# Patient Record
Sex: Female | Born: 1950 | Race: White | Hispanic: No | Marital: Married | Smoking: Never smoker
Health system: Southern US, Community
[De-identification: ages and names within clinical notes are randomized; demographics above are authoritative.]

## PROBLEM LIST (undated history)

## (undated) DIAGNOSIS — K831 Obstruction of bile duct: Secondary | ICD-10-CM

## (undated) DIAGNOSIS — K8689 Other specified diseases of pancreas: Secondary | ICD-10-CM

## (undated) DIAGNOSIS — Z9189 Other specified personal risk factors, not elsewhere classified: Secondary | ICD-10-CM

## (undated) DIAGNOSIS — T7840XA Allergy, unspecified, initial encounter: Secondary | ICD-10-CM

## (undated) DIAGNOSIS — E669 Obesity, unspecified: Secondary | ICD-10-CM

## (undated) DIAGNOSIS — Z9884 Bariatric surgery status: Secondary | ICD-10-CM

## (undated) DIAGNOSIS — K219 Gastro-esophageal reflux disease without esophagitis: Secondary | ICD-10-CM

## (undated) DIAGNOSIS — E1129 Type 2 diabetes mellitus with other diabetic kidney complication: Secondary | ICD-10-CM

## (undated) DIAGNOSIS — Z803 Family history of malignant neoplasm of breast: Secondary | ICD-10-CM

## (undated) DIAGNOSIS — Z8719 Personal history of other diseases of the digestive system: Secondary | ICD-10-CM

## (undated) DIAGNOSIS — E282 Polycystic ovarian syndrome: Secondary | ICD-10-CM

## (undated) DIAGNOSIS — C50919 Malignant neoplasm of unspecified site of unspecified female breast: Secondary | ICD-10-CM

## (undated) DIAGNOSIS — E119 Type 2 diabetes mellitus without complications: Secondary | ICD-10-CM

## (undated) DIAGNOSIS — G4700 Insomnia, unspecified: Secondary | ICD-10-CM

## (undated) DIAGNOSIS — I10 Essential (primary) hypertension: Secondary | ICD-10-CM

## (undated) DIAGNOSIS — Z87442 Personal history of urinary calculi: Secondary | ICD-10-CM

## (undated) DIAGNOSIS — Z973 Presence of spectacles and contact lenses: Secondary | ICD-10-CM

## (undated) DIAGNOSIS — E785 Hyperlipidemia, unspecified: Secondary | ICD-10-CM

## (undated) DIAGNOSIS — C50411 Malignant neoplasm of upper-outer quadrant of right female breast: Secondary | ICD-10-CM

## (undated) DIAGNOSIS — F419 Anxiety disorder, unspecified: Secondary | ICD-10-CM

## (undated) DIAGNOSIS — Z923 Personal history of irradiation: Secondary | ICD-10-CM

## (undated) DIAGNOSIS — E876 Hypokalemia: Secondary | ICD-10-CM

## (undated) DIAGNOSIS — T884XXA Failed or difficult intubation, initial encounter: Secondary | ICD-10-CM

## (undated) HISTORY — DX: Hyperlipidemia, unspecified: E78.5

## (undated) HISTORY — DX: Type 2 diabetes mellitus without complications: E11.9

## (undated) HISTORY — PX: DIAGNOSTIC LAPAROSCOPY: SUR761

## (undated) HISTORY — PX: BREAST BIOPSY: SHX20

## (undated) HISTORY — DX: Type 2 diabetes mellitus with other diabetic kidney complication: E11.29

## (undated) HISTORY — DX: Insomnia, unspecified: G47.00

## (undated) HISTORY — DX: Anxiety disorder, unspecified: F41.9

## (undated) HISTORY — DX: Allergy, unspecified, initial encounter: T78.40XA

## (undated) HISTORY — DX: Malignant neoplasm of upper-outer quadrant of right female breast: C50.411

## (undated) HISTORY — PX: CHOLECYSTECTOMY: SHX55

## (undated) HISTORY — DX: Malignant neoplasm of unspecified site of unspecified female breast: C50.919

## (undated) HISTORY — PX: TUBAL LIGATION: SHX77

## (undated) HISTORY — DX: Obstruction of bile duct: K83.1

## (undated) HISTORY — DX: Family history of malignant neoplasm of breast: Z80.3

## (undated) HISTORY — PX: BACK SURGERY: SHX140

---
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
## 1998-04-02 ENCOUNTER — Encounter: Payer: Self-pay | Admitting: Internal Medicine

## 1998-04-02 ENCOUNTER — Ambulatory Visit (HOSPITAL_COMMUNITY): Admission: RE | Admit: 1998-04-02 | Discharge: 1998-04-02 | Payer: Self-pay | Admitting: Internal Medicine

## 2001-06-02 ENCOUNTER — Ambulatory Visit (HOSPITAL_COMMUNITY): Admission: RE | Admit: 2001-06-02 | Discharge: 2001-06-02 | Payer: Self-pay | Admitting: Internal Medicine

## 2001-06-02 ENCOUNTER — Encounter: Payer: Self-pay | Admitting: Internal Medicine

## 2002-06-06 ENCOUNTER — Ambulatory Visit (HOSPITAL_COMMUNITY): Admission: RE | Admit: 2002-06-06 | Discharge: 2002-06-06 | Payer: Self-pay | Admitting: Internal Medicine

## 2002-06-06 ENCOUNTER — Encounter: Payer: Self-pay | Admitting: Internal Medicine

## 2002-06-24 ENCOUNTER — Encounter: Payer: Self-pay | Admitting: Internal Medicine

## 2002-06-24 ENCOUNTER — Ambulatory Visit (HOSPITAL_COMMUNITY): Admission: RE | Admit: 2002-06-24 | Discharge: 2002-06-24 | Payer: Self-pay | Admitting: Internal Medicine

## 2002-07-07 ENCOUNTER — Encounter: Payer: Self-pay | Admitting: Neurosurgery

## 2002-07-11 ENCOUNTER — Inpatient Hospital Stay (HOSPITAL_COMMUNITY): Admission: RE | Admit: 2002-07-11 | Discharge: 2002-07-12 | Payer: Self-pay | Admitting: Neurosurgery

## 2002-07-11 ENCOUNTER — Encounter: Payer: Self-pay | Admitting: Neurosurgery

## 2002-11-03 ENCOUNTER — Other Ambulatory Visit: Admission: RE | Admit: 2002-11-03 | Discharge: 2002-11-03 | Payer: Self-pay | Admitting: Internal Medicine

## 2003-01-13 LAB — HM DEXA SCAN: HM Dexa Scan: NORMAL

## 2003-08-23 ENCOUNTER — Ambulatory Visit (HOSPITAL_COMMUNITY): Admission: RE | Admit: 2003-08-23 | Discharge: 2003-08-23 | Payer: Self-pay | Admitting: Internal Medicine

## 2004-01-13 LAB — HM COLONOSCOPY

## 2004-09-16 ENCOUNTER — Ambulatory Visit: Payer: Self-pay | Admitting: Gastroenterology

## 2004-09-29 ENCOUNTER — Ambulatory Visit: Payer: Self-pay | Admitting: Gastroenterology

## 2004-09-29 HISTORY — PX: COLONOSCOPY: SHX174

## 2004-10-08 ENCOUNTER — Ambulatory Visit: Payer: Self-pay | Admitting: Gastroenterology

## 2004-10-21 ENCOUNTER — Ambulatory Visit (HOSPITAL_COMMUNITY): Admission: RE | Admit: 2004-10-21 | Discharge: 2004-10-21 | Payer: Self-pay | Admitting: Internal Medicine

## 2005-08-12 ENCOUNTER — Other Ambulatory Visit: Admission: RE | Admit: 2005-08-12 | Discharge: 2005-08-12 | Payer: Self-pay | Admitting: Internal Medicine

## 2005-10-27 ENCOUNTER — Ambulatory Visit (HOSPITAL_COMMUNITY): Admission: RE | Admit: 2005-10-27 | Discharge: 2005-10-27 | Payer: Self-pay | Admitting: Internal Medicine

## 2005-11-09 ENCOUNTER — Encounter: Admission: RE | Admit: 2005-11-09 | Discharge: 2005-11-09 | Payer: Self-pay | Admitting: Internal Medicine

## 2006-01-12 HISTORY — PX: HERNIA REPAIR: SHX51

## 2006-06-29 ENCOUNTER — Ambulatory Visit (HOSPITAL_COMMUNITY): Admission: RE | Admit: 2006-06-29 | Discharge: 2006-06-29 | Payer: Self-pay | Admitting: Internal Medicine

## 2006-06-30 ENCOUNTER — Ambulatory Visit (HOSPITAL_COMMUNITY): Admission: RE | Admit: 2006-06-30 | Discharge: 2006-06-30 | Payer: Self-pay | Admitting: Surgery

## 2006-06-30 ENCOUNTER — Encounter: Admission: RE | Admit: 2006-06-30 | Discharge: 2006-06-30 | Payer: Self-pay | Admitting: Surgery

## 2006-07-22 ENCOUNTER — Ambulatory Visit (HOSPITAL_BASED_OUTPATIENT_CLINIC_OR_DEPARTMENT_OTHER): Admission: RE | Admit: 2006-07-22 | Discharge: 2006-07-22 | Payer: Self-pay | Admitting: Surgery

## 2006-11-01 ENCOUNTER — Encounter: Admission: RE | Admit: 2006-11-01 | Discharge: 2006-11-01 | Payer: Self-pay | Admitting: Internal Medicine

## 2006-11-02 ENCOUNTER — Encounter: Admission: RE | Admit: 2006-11-02 | Discharge: 2007-01-31 | Payer: Self-pay | Admitting: Surgery

## 2006-11-13 HISTORY — PX: LAPAROSCOPIC GASTRIC BANDING WITH HIATAL HERNIA REPAIR: SHX6351

## 2006-11-16 ENCOUNTER — Ambulatory Visit (HOSPITAL_COMMUNITY): Admission: RE | Admit: 2006-11-16 | Discharge: 2006-11-16 | Payer: Self-pay | Admitting: Surgery

## 2006-11-16 ENCOUNTER — Ambulatory Visit (HOSPITAL_COMMUNITY): Admission: RE | Admit: 2006-11-16 | Discharge: 2006-11-17 | Payer: Self-pay | Admitting: Surgery

## 2007-02-08 ENCOUNTER — Encounter: Admission: RE | Admit: 2007-02-08 | Discharge: 2007-02-08 | Payer: Self-pay | Admitting: Surgery

## 2007-05-03 ENCOUNTER — Encounter: Admission: RE | Admit: 2007-05-03 | Discharge: 2007-05-03 | Payer: Self-pay | Admitting: Surgery

## 2007-08-02 ENCOUNTER — Encounter: Admission: RE | Admit: 2007-08-02 | Discharge: 2007-10-04 | Payer: Self-pay | Admitting: Surgery

## 2007-11-02 ENCOUNTER — Encounter: Admission: RE | Admit: 2007-11-02 | Discharge: 2007-11-02 | Payer: Self-pay | Admitting: Internal Medicine

## 2008-01-13 DIAGNOSIS — Z9884 Bariatric surgery status: Secondary | ICD-10-CM

## 2008-01-13 HISTORY — DX: Bariatric surgery status: Z98.84

## 2008-11-02 ENCOUNTER — Encounter: Admission: RE | Admit: 2008-11-02 | Discharge: 2008-11-02 | Payer: Self-pay | Admitting: Internal Medicine

## 2009-11-04 ENCOUNTER — Encounter: Admission: RE | Admit: 2009-11-04 | Discharge: 2009-11-04 | Payer: Self-pay | Admitting: Internal Medicine

## 2009-11-20 ENCOUNTER — Other Ambulatory Visit: Admission: RE | Admit: 2009-11-20 | Discharge: 2009-11-20 | Payer: Self-pay | Admitting: Internal Medicine

## 2010-05-27 NOTE — Procedures (Signed)
NAME:  Taylor Oconnor, Taylor Oconnor                 ACCOUNT NO.:  [id number]   MEDICAL RECORD NO.:  [id number]          PATIENT TYPE:  OUT   LOCATION:  SLEEP CENTER                 FACILITY:  [facility]   PHYSICIAN:  Clinton D. Maple Hudson, MD, FCCP, FACPDATE OF BIRTH:  12-08-1951   DATE OF STUDY:  07/22/2006                            NOCTURNAL POLYSOMNOGRAM   REFERRING PHYSICIAN:  Thornton Park. Daphine Deutscher, MD   INDICATION FOR STUDY:  Insomnia with sleep apnea.  A diagnostic NPSG  protocol was requested.   EPWORTH SLEEPINESS SCORE:  2/24; BMI 39; weight 230 pounds.   MEDICATIONS:  Home medications are listed and reviewed.   SLEEP ARCHITECTURE:  Short total sleep time 259 minutes with sleep  efficiency 64%; stage I was 10%, stage II 57%, stage III 10%, REM 23% of  total sleep time.  Sleep latency 82 minutes.  REM latency 61 minutes.  Awake after sleep onset 78 minutes.  Arousal index 9.  No bedtime  medication was taken.   RESPIRATORY DATA:  Diagnostic NPSG protocol as ordered.  Apnea-hypopnea  index (AHI, RDI) 28.4 obstructive events per hour, indicating moderate  obstructive sleep apnea/hypopnea syndrome.  There was 1 central apnea, 6  obstructive apneas, 2 mixed apneas and 114 hypopneas.  Most sleep and  therefore most events were recorded while on her back.  REM AHI 21.   OXYGEN DATA:  Moderate snoring with oxygen desaturation to a nadir of  88%.  Mean oxygen saturation through the study was 94% on room air.   CARDIAC DATA:  Sinus rhythm with occasional PVC.   MOVEMENT/PARASOMNIA:  No movement disorder recorded.   IMPRESSIONS/RECOMMENDATIONS:  1. Moderate short total sleep time reflecting frequent sustained      waking, nonspecific.  Some of these were associated with      respiratory events, but most were not, making them consistent with      nonspecific insomnia.  2. Moderate obstructive sleep apnea/hypopnea syndrome, apnea-hypopnea      index 28.4 per hour with most events recorded while sleeping on  her      back.  Moderate snoring with oxygen desaturation to a nadir of 88%.  3. Consider return for continuous positive airway pressure titration      or alternative therapies as appropriate.  4. If early surgery is anticipated, an empiric continuous positive      airway pressure setting of 10 CWP would be reasonable at the time      of surgery.      Clinton D. Maple Hudson, MD, [facility], FACP  Diplomate, Biomedical engineer of Sleep Medicine  Electronically Signed     CDY/MEDQ  D:  07/28/2006 16:31:34  T:  07/29/2006 12:02:36  Job:  [id number]

## 2010-05-27 NOTE — Op Note (Signed)
NAME:  Taylor Oconnor, Taylor Oconnor NO.:  [id number]   MEDICAL RECORD NO.:  [id number]          PATIENT TYPE:  OUT   LOCATION:  XRAY                         FACILITY:  [facility]   PHYSICIAN:  Thornton Park. Daphine Deutscher, MD  DATE OF BIRTH:  01-Oct-1950   DATE OF PROCEDURE:  11/16/2006  DATE OF DISCHARGE:                               OPERATIVE REPORT   PREOPERATIVE DIAGNOSIS:  Morbid obesity with hiatal hernia.   POSTOPERATIVE DIAGNOSIS:  Morbid obesity with hiatal hernia.   PROCEDURE:  Laparoscopic repair of hiatal hernia with lap band APS  system.   SURGEON:  Thornton Park. Daphine Deutscher, MD   ASSISTANT:  Sandria Bales. Ezzard Standing, M.D.   ANESTHESIA:  General endotracheal.   DESCRIPTION OF PROCEDURE:  Taylor Oconnor was taken to room 1 on the  afternoon of November 16, 2006 and given general anesthesia.  The abdomen  was prepped with Techni-Care and draped sterilely.  The abdomen was  entered with a 0 degrees OptiVu in the left upper quadrant entering  without difficulty.  There was an adhesion to previous umbilical hernia  repair which was taken down sharply.  Standard trocars were placed in  the upper abdomen.  Liver was retracted and I could see a prominent  dimple anteriorly where her hiatal hernia was.  I pulled up the films,  there were in the room.  I studied those and felt that we needed to  repair that.   I went ahead and took down the esophagogastric peritoneal crural  attachments and mobilized things posteriorly where I could see both  right and left crura.  The esophagus was then well in the abdomen and I  went ahead and repaired the crura posteriorly with three sutures using  the Endo stitch and tie knots.  A fourth suture was there which we  really just sort of did initial approximation, was not tight enough.  This pretty much obliterated the space around the esophagus in the  hiatus.  The sizing tube was passed after passed first the band passer  through a separate spot down farther.  An  APS band was introduced and  brought around and snapped in place.  It was plicated with four sutures  using free technique of Surgidac plicating up onto the anterior portion  of the small proximal pouch.  When this was completed, the port was then  brought out on the lower side through the 11 port on the right side and  connected to the port.  I first cut off the tip allowing the back  pressure to bleed out.  The saline and came out to the point where it  was essentially equilibrating with the anatomy on the inside and was  connected to the port which was then sewn closed with four sutures of  Prolene.  Wounds were all irrigated and closed 4-0 Vicryl.  The patient  tolerated the procedure well and was taken to recovery room in  satisfactory condition.      Thornton Park Daphine Deutscher, MD  Electronically Signed    MBM/MEDQ  D:  11/16/2006  T:  11/17/2006  Job:  [id number]

## 2010-05-30 NOTE — Op Note (Signed)
NAME:  Taylor Oconnor, Taylor Oconnor                           ACCOUNT NO.:  [id number]   MEDICAL RECORD NO.:  [id number]                   PATIENT TYPE:  INP   LOCATION:  NA                                   FACILITY:  [facility]   PHYSICIAN:  Clydene Fake, M.D.               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   PROCEDURE:  Anterior cervical decompression, diskectomy and fusion of C5-6  with like allograft known  and tether anterior cervical plate.   SURGEON:  Clydene Fake, M.D.   ASSISTANT:  Payton Doughty, M.D.   ANESTHESIA:  Endotracheal tube anesthesia.   ESTIMATED BLOOD LOSS:  Minimal. No blood given.   DRAINS:  None.   COMPLICATIONS:  None.   INDICATIONS FOR PROCEDURE:  The patient is a 60 year old woman who has been  having many years of intermittent neck  pain. Over the last couple of years  she started having more and more neck pain and over the last month or so she  started having neck radiating down to the right shoulder blade and into the  arm towards the thumb. There is some numbness in that same distribution. An  MRI was done showing serious spondylosis C5-6 especially right side with  bone spurring and disk  herniation in the foramen compressing the C6 root.  The patient is brought in for decompression and fusion.   DESCRIPTION OF PROCEDURE:  The patient was brought to the operating room and  general anesthesia was induced. The patient was placed in 10 pounds of  Halter traction and prepped and draped in the usual sterile fashion. The  area of incision was injected with 10 mL of 1% Lidocaine with epinephrine.   An incision was made from the midline  to the  anterior border of the  sternocleidomastoid muscle on the left and the neck incision was to come  down to the platysma. Hemostasis was obtained with Bovie cauterization. The  platysma was incised with the Bovie and blunt dissection was taken through  the anterior cervical fascia to the anterior cervical spine. A needle  was  placed into the interspace and x-rays were obtained showing this was the 4-5  interspace.   I moved 1 space down and incised the disk space there. I did a partial  diskectomy. I removed the needle up to the space. The longus coli muscles  were reflected laterally over the C5-6. A self-retaining retractor was  placed and distraction pins were placed in the C5 and C6 and the interspace  was distracted. The diskectomy was then completed using pituitary rongeurs,  curets and 1 to 2  mm Kerrison punches.   Bilateral foraminotomies were then performed, especially into the right, and  we needed to do extensive lateral foraminotomy to remove the bone spur and  decompress the C6 root.  It appears that we had good decompression of the  central __________ and a bilateral foramen decompression of both nerve  roots.   The high-speed drill was then used to remove the cartilaginous endplate. The  __________ trial system was used and a 5-mm segment of bone was then tapped  into place. It was countersunk about 1 mm and we checked behind the bone  graft. There was plenty of room between the bone and the dura posteriorly.  The wound was irrigated with antibiotic solution.   The distraction pins were removed. The traction weight was removed.  Hemostasis was obtained with Gelfoam and thrombin and then this was removed.  A Tether anterior cervical plate was placed over the anterior cervical  spine. Two screws were placed in the C5 and 2 into C6. They were tightened  down. A lateral x-ray was obtained showing good position  of the plate  screws and interbody bone at the 5-6 level,  although the picture was a bit  blurred with the patient's shoulders blocking our view. We ensured adequate  position  as best we could tell.   Adequate hemostasis was obtained with bipolar cauterization and Gelfoam and  thrombin after the retractors were removed. The Gelfoam was irrigated out  and the platysma was then closed with 3-0 Vicryl interrupted sutures. The  subcutaneous tissue was closed with the same and the skin was closed Benzoin  and Steri-Strips. A dressing was placed. The patient was placed in a soft  collar.   The patient was awakened from anesthesia. She was returned to the recovery  room in stable condition.                                               Clydene Fake, M.D.    JRH/MEDQ  D:  07/11/2002  T:  07/11/2002  Job:  [id number]

## 2010-10-16 ENCOUNTER — Other Ambulatory Visit: Payer: Self-pay | Admitting: Internal Medicine

## 2010-10-16 DIAGNOSIS — Z1231 Encounter for screening mammogram for malignant neoplasm of breast: Secondary | ICD-10-CM

## 2010-10-21 LAB — DIFFERENTIAL
Basophils Relative: 0
Eosinophils Absolute: 0
Eosinophils Relative: 0
Lymphs Abs: 1.6
Monocytes Absolute: 0.5
Monocytes Relative: 6

## 2010-10-21 LAB — CBC
HCT: 38.8
MCHC: 35.1
MCV: 91.4
RBC: 4.25
WBC: 9.9

## 2010-10-22 LAB — BASIC METABOLIC PANEL
BUN: 15
CO2: 31
Calcium: 9.6
Creatinine, Ser: 0.88
GFR calc non Af Amer: >60
Glucose, Bld: 133 — ABNORMAL HIGH

## 2010-11-07 ENCOUNTER — Ambulatory Visit
Admission: RE | Admit: 2010-11-07 | Discharge: 2010-11-07 | Disposition: A | Discharge status: Home / Self Care | Payer: 59 | Source: Ambulatory Visit | Attending: Internal Medicine | Admitting: Internal Medicine

## 2010-11-07 DIAGNOSIS — Z1231 Encounter for screening mammogram for malignant neoplasm of breast: Secondary | ICD-10-CM

## 2011-09-29 ENCOUNTER — Other Ambulatory Visit: Payer: Self-pay | Admitting: Internal Medicine

## 2011-09-29 DIAGNOSIS — Z1231 Encounter for screening mammogram for malignant neoplasm of breast: Secondary | ICD-10-CM

## 2011-11-09 ENCOUNTER — Ambulatory Visit
Admission: RE | Admit: 2011-11-09 | Discharge: 2011-11-09 | Disposition: A | Discharge status: Home / Self Care | Payer: 59 | Source: Ambulatory Visit | Attending: Internal Medicine | Admitting: Internal Medicine

## 2011-11-09 DIAGNOSIS — Z1231 Encounter for screening mammogram for malignant neoplasm of breast: Secondary | ICD-10-CM

## 2012-08-06 ENCOUNTER — Encounter (HOSPITAL_COMMUNITY)
Admission: EM | Disposition: A | Discharge status: Home / Self Care | Payer: Self-pay | Source: Home / Self Care | Attending: Emergency Medicine

## 2012-08-06 ENCOUNTER — Emergency Department (HOSPITAL_COMMUNITY): Payer: 59

## 2012-08-06 ENCOUNTER — Observation Stay (HOSPITAL_COMMUNITY)
Admission: EM | Admit: 2012-08-06 | Discharge: 2012-08-07 | Disposition: A | Discharge status: Home / Self Care | Payer: 59 | Attending: Surgery | Admitting: Surgery

## 2012-08-06 ENCOUNTER — Observation Stay (HOSPITAL_COMMUNITY): Payer: 59 | Admitting: Anesthesiology

## 2012-08-06 ENCOUNTER — Encounter (HOSPITAL_COMMUNITY): Payer: Self-pay | Admitting: Anesthesiology

## 2012-08-06 ENCOUNTER — Encounter (HOSPITAL_COMMUNITY): Payer: Self-pay | Admitting: *Deleted

## 2012-08-06 DIAGNOSIS — Z9089 Acquired absence of other organs: Secondary | ICD-10-CM | POA: Insufficient documentation

## 2012-08-06 DIAGNOSIS — K449 Diaphragmatic hernia without obstruction or gangrene: Secondary | ICD-10-CM | POA: Insufficient documentation

## 2012-08-06 DIAGNOSIS — K37 Unspecified appendicitis: Secondary | ICD-10-CM

## 2012-08-06 DIAGNOSIS — N859 Noninflammatory disorder of uterus, unspecified: Secondary | ICD-10-CM | POA: Insufficient documentation

## 2012-08-06 DIAGNOSIS — Z9884 Bariatric surgery status: Secondary | ICD-10-CM | POA: Insufficient documentation

## 2012-08-06 DIAGNOSIS — K7689 Other specified diseases of liver: Secondary | ICD-10-CM | POA: Insufficient documentation

## 2012-08-06 DIAGNOSIS — K3580 Unspecified acute appendicitis: Principal | ICD-10-CM | POA: Insufficient documentation

## 2012-08-06 DIAGNOSIS — R1031 Right lower quadrant pain: Secondary | ICD-10-CM

## 2012-08-06 DIAGNOSIS — D72829 Elevated white blood cell count, unspecified: Secondary | ICD-10-CM | POA: Insufficient documentation

## 2012-08-06 HISTORY — DX: Gastro-esophageal reflux disease without esophagitis: K21.9

## 2012-08-06 HISTORY — PX: LAPAROSCOPIC APPENDECTOMY: SHX408

## 2012-08-06 HISTORY — DX: Essential (primary) hypertension: I10

## 2012-08-06 HISTORY — DX: Failed or difficult intubation, initial encounter: T88.4XXA

## 2012-08-06 HISTORY — DX: Personal history of other diseases of the digestive system: Z87.19

## 2012-08-06 HISTORY — PX: APPENDECTOMY: SHX54

## 2012-08-06 HISTORY — DX: Other specified personal risk factors, not elsewhere classified: Z91.89

## 2012-08-06 LAB — URINALYSIS, ROUTINE W REFLEX MICROSCOPIC
Glucose, UA: NEGATIVE mg/dL
Ketones, ur: NEGATIVE mg/dL
Leukocytes, UA: NEGATIVE
Nitrite: NEGATIVE
Specific Gravity, Urine: 1.032 — ABNORMAL HIGH (ref 1.005–1.030)
pH: 7.5 (ref 5.0–8.0)

## 2012-08-06 LAB — CBC WITH DIFFERENTIAL/PLATELET
Basophils Absolute: 0 10*3/uL (ref 0.0–0.1)
Eosinophils Absolute: 0.1 10*3/uL (ref 0.0–0.7)
Eosinophils Relative: 1 % (ref 0–5)
HCT: 40.4 % (ref 36.0–46.0)
Lymphocytes Relative: 12 % (ref 12–46)
MCH: 30.9 pg (ref 26.0–34.0)
MCHC: 33.9 g/dL (ref 30.0–36.0)
MCV: 91 fL (ref 78.0–100.0)
Monocytes Absolute: 0.6 10*3/uL (ref 0.1–1.0)
RDW: 13.3 % (ref 11.5–15.5)
WBC: 11.4 10*3/uL — ABNORMAL HIGH (ref 4.0–10.5)

## 2012-08-06 LAB — COMPREHENSIVE METABOLIC PANEL
AST: 24 U/L (ref 0–37)
CO2: 27 mEq/L (ref 19–32)
Calcium: 9.2 mg/dL (ref 8.4–10.5)
Creatinine, Ser: 0.83 mg/dL (ref 0.50–1.10)
GFR calc Af Amer: 87 mL/min — ABNORMAL LOW (ref >90)
GFR calc non Af Amer: 75 mL/min — ABNORMAL LOW (ref >90)
Glucose, Bld: 118 mg/dL — ABNORMAL HIGH (ref 70–99)
Total Protein: 6.9 g/dL (ref 6.0–8.3)

## 2012-08-06 SURGERY — APPENDECTOMY, LAPAROSCOPIC
Anesthesia: General | Wound class: Clean Contaminated

## 2012-08-06 MED ORDER — LACTATED RINGERS IV SOLN
INTRAVENOUS | Status: DC | PRN
  Administered 2012-08-06: 17:00:00 via INTRAVENOUS

## 2012-08-06 MED ORDER — ONDANSETRON HCL 4 MG/2ML IJ SOLN
4.0000 mg | Freq: Four times a day (QID) | INTRAMUSCULAR | Status: DC | PRN
  Administered 2012-08-06: 4 mg via INTRAVENOUS
  Filled 2012-08-06 (×2): qty 2

## 2012-08-06 MED ORDER — LIDOCAINE HCL (CARDIAC) 20 MG/ML IV SOLN
INTRAVENOUS | Status: DC | PRN
  Administered 2012-08-06: 30 mg via INTRAVENOUS

## 2012-08-06 MED ORDER — SODIUM CHLORIDE 0.9 % IV SOLN
3.0000 g | Freq: Four times a day (QID) | INTRAVENOUS | Status: DC
  Administered 2012-08-06 – 2012-08-07 (×5): 3 g via INTRAVENOUS
  Filled 2012-08-06 (×7): qty 3

## 2012-08-06 MED ORDER — LACTATED RINGERS IV SOLN
INTRAVENOUS | Status: DC
  Administered 2012-08-06: 20:00:00 via INTRAVENOUS

## 2012-08-06 MED ORDER — LACTATED RINGERS IR SOLN
Status: DC | PRN
  Administered 2012-08-06: 1000 mL

## 2012-08-06 MED ORDER — HYDROMORPHONE HCL PF 1 MG/ML IJ SOLN
1.0000 mg | Freq: Once | INTRAMUSCULAR | Status: AC
  Administered 2012-08-06: 1 mg via INTRAVENOUS
  Filled 2012-08-06: qty 1

## 2012-08-06 MED ORDER — NEOSTIGMINE METHYLSULFATE 1 MG/ML IJ SOLN
INTRAMUSCULAR | Status: DC | PRN
  Administered 2012-08-06: 3 mg via INTRAVENOUS

## 2012-08-06 MED ORDER — KETAMINE HCL 10 MG/ML IJ SOLN
INTRAMUSCULAR | Status: DC | PRN
  Administered 2012-08-06: 15 mg via INTRAVENOUS

## 2012-08-06 MED ORDER — VERAPAMIL HCL 80 MG PO TABS
80.0000 mg | ORAL_TABLET | Freq: Two times a day (BID) | ORAL | Status: DC
Start: 2012-08-06 — End: 2012-08-07
  Administered 2012-08-07: 80 mg via ORAL
  Filled 2012-08-06 (×3): qty 1

## 2012-08-06 MED ORDER — ACETAMINOPHEN 650 MG RE SUPP: 650.0000 mg | Freq: Four times a day (QID) | RECTAL | Status: DC | PRN

## 2012-08-06 MED ORDER — SODIUM CHLORIDE 0.9 % IV SOLN
1000.0000 mL | INTRAVENOUS | Status: DC
  Administered 2012-08-06: 1000 mL via INTRAVENOUS

## 2012-08-06 MED ORDER — HYDROMORPHONE HCL PF 1 MG/ML IJ SOLN
1.0000 mg | INTRAMUSCULAR | Status: DC | PRN
  Administered 2012-08-06: 1 mg via INTRAVENOUS
  Filled 2012-08-06 (×2): qty 1

## 2012-08-06 MED ORDER — ONDANSETRON HCL 4 MG/2ML IJ SOLN
4.0000 mg | Freq: Four times a day (QID) | INTRAMUSCULAR | Status: DC | PRN
  Administered 2012-08-06: 4 mg via INTRAVENOUS

## 2012-08-06 MED ORDER — ACETAMINOPHEN 325 MG PO TABS
650.0000 mg | ORAL_TABLET | Freq: Four times a day (QID) | ORAL | Status: DC | PRN
  Administered 2012-08-07: 650 mg via ORAL
  Filled 2012-08-06: qty 2

## 2012-08-06 MED ORDER — BUPIVACAINE-EPINEPHRINE PF 0.25-1:200000 % IJ SOLN
INTRAMUSCULAR | Status: AC
  Filled 2012-08-06: qty 30

## 2012-08-06 MED ORDER — KCL IN DEXTROSE-NACL 30-5-0.45 MEQ/L-%-% IV SOLN
INTRAVENOUS | Status: DC
  Administered 2012-08-06: 14:00:00 via INTRAVENOUS
  Filled 2012-08-06 (×3): qty 1000

## 2012-08-06 MED ORDER — IOHEXOL 300 MG/ML  SOLN
100.0000 mL | Freq: Once | INTRAMUSCULAR | Status: AC | PRN
  Administered 2012-08-06: 100 mL via INTRAVENOUS

## 2012-08-06 MED ORDER — ALPRAZOLAM 0.5 MG PO TABS: 0.5000 mg | ORAL_TABLET | Freq: Every evening | ORAL | Status: DC | PRN

## 2012-08-06 MED ORDER — BUPIVACAINE-EPINEPHRINE 0.25% -1:200000 IJ SOLN
INTRAMUSCULAR | Status: DC | PRN
  Administered 2012-08-06: 20 mL

## 2012-08-06 MED ORDER — IBUPROFEN 600 MG PO TABS
600.0000 mg | ORAL_TABLET | Freq: Four times a day (QID) | ORAL | Status: DC | PRN
  Filled 2012-08-06: qty 1

## 2012-08-06 MED ORDER — 0.9 % SODIUM CHLORIDE (POUR BTL) OPTIME
TOPICAL | Status: DC | PRN
  Administered 2012-08-06: 1000 mL

## 2012-08-06 MED ORDER — KCL IN DEXTROSE-NACL 30-5-0.45 MEQ/L-%-% IV SOLN
INTRAVENOUS | Status: DC
  Administered 2012-08-06: 22:00:00 via INTRAVENOUS
  Filled 2012-08-06 (×3): qty 1000

## 2012-08-06 MED ORDER — FENTANYL CITRATE 0.05 MG/ML IJ SOLN
INTRAMUSCULAR | Status: DC | PRN
  Administered 2012-08-06 (×2): 50 ug via INTRAVENOUS
  Administered 2012-08-06: 25 ug via INTRAVENOUS

## 2012-08-06 MED ORDER — SUCCINYLCHOLINE CHLORIDE 20 MG/ML IJ SOLN
INTRAMUSCULAR | Status: DC | PRN
  Administered 2012-08-06: 120 mg via INTRAVENOUS

## 2012-08-06 MED ORDER — ONDANSETRON HCL 4 MG PO TABS: 4.0000 mg | ORAL_TABLET | Freq: Four times a day (QID) | ORAL | Status: DC | PRN

## 2012-08-06 MED ORDER — DEXAMETHASONE SODIUM PHOSPHATE 4 MG/ML IJ SOLN
INTRAMUSCULAR | Status: DC | PRN
  Administered 2012-08-06: 10 mg via INTRAVENOUS

## 2012-08-06 MED ORDER — MIDAZOLAM HCL 5 MG/5ML IJ SOLN
INTRAMUSCULAR | Status: DC | PRN
  Administered 2012-08-06: 1 mg via INTRAVENOUS

## 2012-08-06 MED ORDER — ATENOLOL 50 MG PO TABS
50.0000 mg | ORAL_TABLET | Freq: Every day | ORAL | Status: DC
  Administered 2012-08-06: 50 mg via ORAL
  Filled 2012-08-06 (×2): qty 1

## 2012-08-06 MED ORDER — HYDROCHLOROTHIAZIDE 25 MG PO TABS
12.5000 mg | ORAL_TABLET | Freq: Every day | ORAL | Status: DC
  Filled 2012-08-06: qty 0.5

## 2012-08-06 MED ORDER — HYDROCODONE-ACETAMINOPHEN 5-325 MG PO TABS: 1.0000 | ORAL_TABLET | ORAL | Status: DC | PRN

## 2012-08-06 MED ORDER — GLYCOPYRROLATE 0.2 MG/ML IJ SOLN
INTRAMUSCULAR | Status: DC | PRN
  Administered 2012-08-06: 0.4 mg via INTRAVENOUS

## 2012-08-06 MED ORDER — METFORMIN HCL 500 MG PO TABS
1000.0000 mg | ORAL_TABLET | Freq: Every day | ORAL | Status: DC
  Administered 2012-08-07: 1000 mg via ORAL
  Filled 2012-08-06 (×2): qty 2

## 2012-08-06 MED ORDER — PHENYLEPHRINE HCL 10 MG/ML IJ SOLN
INTRAMUSCULAR | Status: DC | PRN
  Administered 2012-08-06 (×2): 40 ug via INTRAVENOUS

## 2012-08-06 MED ORDER — FENTANYL CITRATE 0.05 MG/ML IJ SOLN: 25.0000 ug | INTRAMUSCULAR | Status: DC | PRN

## 2012-08-06 MED ORDER — SODIUM CHLORIDE 0.9 % IV SOLN
1000.0000 mL | Freq: Once | INTRAVENOUS | Status: AC
  Administered 2012-08-06: 1000 mL via INTRAVENOUS

## 2012-08-06 MED ORDER — ONDANSETRON HCL 4 MG/2ML IJ SOLN
INTRAMUSCULAR | Status: DC | PRN
  Administered 2012-08-06 (×2): 2 mg via INTRAVENOUS

## 2012-08-06 MED ORDER — ONDANSETRON HCL 4 MG/2ML IJ SOLN
4.0000 mg | Freq: Once | INTRAMUSCULAR | Status: AC
  Administered 2012-08-06: 4 mg via INTRAVENOUS
  Filled 2012-08-06: qty 2

## 2012-08-06 MED ORDER — IOHEXOL 300 MG/ML  SOLN
50.0000 mL | Freq: Once | INTRAMUSCULAR | Status: AC | PRN
  Administered 2012-08-06: 50 mL via ORAL

## 2012-08-06 MED ORDER — EPHEDRINE SULFATE 50 MG/ML IJ SOLN
INTRAMUSCULAR | Status: DC | PRN
  Administered 2012-08-06: 10 mg via INTRAVENOUS
  Administered 2012-08-06: 15 mg via INTRAVENOUS

## 2012-08-06 MED ORDER — PROPOFOL 10 MG/ML IV BOLUS
INTRAVENOUS | Status: DC | PRN
  Administered 2012-08-06: 150 mg via INTRAVENOUS
  Administered 2012-08-06: 50 mg via INTRAVENOUS

## 2012-08-06 SURGICAL SUPPLY — 40 items
APL SKNCLS STERI-STRIP NONHPOA (GAUZE/BANDAGES/DRESSINGS) ×1
APPLIER CLIP ROT 10 11.4 M/L (STAPLE)
APR CLP MED LRG 11.4X10 (STAPLE)
BAG SPEC RTRVL LRG 6X4 10 (ENDOMECHANICALS)
BENZOIN TINCTURE PRP APPL 2/3 (GAUZE/BANDAGES/DRESSINGS) ×2 IMPLANT
CANISTER SUCTION 2500CC (MISCELLANEOUS) ×2 IMPLANT
CLIP APPLIE ROT 10 11.4 M/L (STAPLE) IMPLANT
CLOTH BEACON ORANGE TIMEOUT ST (SAFETY) ×2 IMPLANT
CUTTER FLEX LINEAR 45M (STAPLE) ×1 IMPLANT
DECANTER SPIKE VIAL GLASS SM (MISCELLANEOUS) ×2 IMPLANT
DRAPE LAPAROSCOPIC ABDOMINAL (DRAPES) ×2 IMPLANT
ELECT REM PT RETURN 9FT ADLT (ELECTROSURGICAL) ×2
ELECTRODE REM PT RTRN 9FT ADLT (ELECTROSURGICAL) ×1 IMPLANT
ENDOLOOP SUT PDS II  0 18 (SUTURE)
ENDOLOOP SUT PDS II 0 18 (SUTURE) IMPLANT
GLOVE BIOGEL PI IND STRL 7.0 (GLOVE) ×1 IMPLANT
GLOVE BIOGEL PI INDICATOR 7.0 (GLOVE) ×1
GLOVE SURG ORTHO 8.0 STRL STRW (GLOVE) ×2 IMPLANT
GOWN STRL NON-REIN LRG LVL3 (GOWN DISPOSABLE) ×2 IMPLANT
GOWN STRL REIN XL XLG (GOWN DISPOSABLE) ×4 IMPLANT
KIT BASIN OR (CUSTOM PROCEDURE TRAY) ×2 IMPLANT
PENCIL BUTTON HOLSTER BLD 10FT (ELECTRODE) IMPLANT
POUCH SPECIMEN RETRIEVAL 10MM (ENDOMECHANICALS) IMPLANT
RELOAD 45 VASCULAR/THIN (ENDOMECHANICALS) IMPLANT
RELOAD STAPLE 45 2.5 WHT GRN (ENDOMECHANICALS) IMPLANT
RELOAD STAPLE 45 3.5 BLU ETS (ENDOMECHANICALS) IMPLANT
RELOAD STAPLE TA45 3.5 REG BLU (ENDOMECHANICALS) ×2 IMPLANT
SCALPEL HARMONIC ACE (MISCELLANEOUS) IMPLANT
SET IRRIG TUBING LAPAROSCOPIC (IRRIGATION / IRRIGATOR) IMPLANT
SOLUTION ANTI FOG 6CC (MISCELLANEOUS) ×2 IMPLANT
STRIP CLOSURE SKIN 1/2X4 (GAUZE/BANDAGES/DRESSINGS) ×2 IMPLANT
SUT MNCRL AB 4-0 PS2 18 (SUTURE) ×2 IMPLANT
TOWEL OR 17X26 10 PK STRL BLUE (TOWEL DISPOSABLE) ×2 IMPLANT
TRAY FOLEY CATH 14FRSI W/METER (CATHETERS) ×2 IMPLANT
TRAY LAP CHOLE (CUSTOM PROCEDURE TRAY) ×2 IMPLANT
TROCAR BLADELESS OPT 5 75 (ENDOMECHANICALS) ×2 IMPLANT
TROCAR XCEL BLUNT TIP 100MML (ENDOMECHANICALS) ×2 IMPLANT
TROCAR XCEL NON-BLD 11X100MML (ENDOMECHANICALS) ×2 IMPLANT
TUBING INSUFFLATION 10FT LAP (TUBING) ×2 IMPLANT
WATER STERILE IRR 1500ML POUR (IV SOLUTION) ×2 IMPLANT

## 2012-08-06 NOTE — Anesthesia Preprocedure Evaluation (Addendum)
Anesthesia Evaluation  Patient identified by MRN, date of birth, ID band Patient awake    Reviewed: Allergy & Precautions, H&P , NPO status , Patient's Chart, lab work & pertinent test results, reviewed documented beta blocker date and time   Airway Mallampati: II TM Distance: >3 FB Neck ROM: full    Dental no notable dental hx. (+) Teeth Intact and Dental Advisory Given   Pulmonary neg pulmonary ROS,  breath sounds clear to auscultation  Pulmonary exam normal       Cardiovascular Exercise Tolerance: Good hypertension, Pt. on home beta blockers Rhythm:regular Rate:Normal     Neuro/Psych negative neurological ROS  negative psych ROS   GI/Hepatic negative GI ROS, Neg liver ROS, GERD-  Medicated and Controlled,  Endo/Other    Renal/GU negative Renal ROS  negative genitourinary   Musculoskeletal   Abdominal   Peds  Hematology negative hematology ROS (+)   Anesthesia Other Findings Large tonsils so wants smaller ETT  Reproductive/Obstetrics negative OB ROS                          Anesthesia Physical Anesthesia Plan  ASA: II  Anesthesia Plan: General   Post-op Pain Management:    Induction: Intravenous  Airway Management Planned: Oral ETT  Additional Equipment:   Intra-op Plan:   Post-operative Plan: Extubation in OR  Informed Consent: I have reviewed the patients History and Physical, chart, labs and discussed the procedure including the risks, benefits and alternatives for the proposed anesthesia with the patient or authorized representative who has indicated his/her understanding and acceptance.   Dental Advisory Given  Plan Discussed with: CRNA and Surgeon  Anesthesia Plan Comments:         Anesthesia Quick Evaluation

## 2012-08-06 NOTE — Anesthesia Postprocedure Evaluation (Signed)
  Anesthesia Post-op Note  Patient: Taylor Oconnor  Procedure(s) Performed: Procedure(s) (LRB): APPENDECTOMY LAPAROSCOPIC (N/A)  Patient Location: PACU  Anesthesia Type: General  Level of Consciousness: awake and alert   Airway and Oxygen Therapy: Patient Spontanous Breathing  Post-op Pain: mild  Post-op Assessment: Post-op Vital signs reviewed, Patient's Cardiovascular Status Stable, Respiratory Function Stable, Patent Airway and No signs of Nausea or vomiting  Last Vitals:  Filed Vitals:   08/06/12 2033  BP: 112/73  Pulse: 95  Temp: 36.4 C  Resp: 16    Post-op Vital Signs: stable   Complications: No apparent anesthesia complications

## 2012-08-06 NOTE — ED Notes (Signed)
Attempted to call reports to floor. RN was in a room and unable to take report. Will call back.

## 2012-08-06 NOTE — H&P (Signed)
Taylor Oconnor is an 62 y.o. female.    General Surgery [facility]  Chief Complaint: abdominal pain, acute appendicitis  HPI: patient is a 62 year old female who presented to the emergency department with less than 24 hour history of abdominal pain localizing to the right lower quadrant. This was associated with nausea but no emesis. Patient had a normal bowel movement early this morning. She denies fevers or chills. Pain persisted and became more severe. She presented to the emergency department. Laboratory studies showed a slightly elevated white blood cell count of 11.4. CT scan of the abdomen and pelvis was obtained and showed findings consistent with early acute appendicitis without perforation. General surgery was consulted for evaluation and management.  Patient has a past history of laparoscopic gastric band placement. She is also undergone a laparoscopic cholecystectomy and cesarean section.  No past medical history on file.  No past surgical history on file.  No family history on file. Social History:  has no tobacco, alcohol, and drug history on file.  Allergies:  Allergies  Allergen Reactions  . Codeine Itching     (Not in a hospital admission)  Results for orders placed during the hospital encounter of 08/06/12 (from the past 48 hour(s))  CBC WITH DIFFERENTIAL     Status: Abnormal   Collection Time    08/06/12  8:15 AM      Result Value Range   WBC 11.4 (*) 4.0 - 10.5 K/uL   RBC 4.44  3.87 - 5.11 MIL/uL   Hemoglobin 13.7  12.0 - 15.0 g/dL   HCT 16.1  09.6 - 04.5 %   MCV 91.0  78.0 - 100.0 fL   MCH 30.9  26.0 - 34.0 pg   MCHC 33.9  30.0 - 36.0 g/dL   RDW 40.9  81.1 - 91.4 %   Platelets 431 (*) 150 - 400 K/uL   Neutrophils Relative % 81 (*) 43 - 77 %   Neutro Abs 9.2 (*) 1.7 - 7.7 K/uL   Lymphocytes Relative 12  12 - 46 %   Lymphs Abs 1.4  0.7 - 4.0 K/uL   Monocytes Relative 5  3 - 12 %   Monocytes Absolute 0.6  0.1 - 1.0 K/uL   Eosinophils Relative 1  0 - 5 %   Eosinophils Absolute 0.1  0.0 - 0.7 K/uL   Basophils Relative 0  0 - 1 %   Basophils Absolute 0.0  0.0 - 0.1 K/uL  COMPREHENSIVE METABOLIC PANEL     Status: Abnormal   Collection Time    08/06/12  8:15 AM      Result Value Range   Sodium 135  135 - 145 mEq/L   Potassium 4.2  3.5 - 5.1 mEq/L   Comment: MODERATE HEMOLYSIS     HEMOLYSIS AT THIS LEVEL MAY AFFECT RESULT   Chloride 98  96 - 112 mEq/L   CO2 27  19 - 32 mEq/L   Glucose, Bld 118 (*) 70 - 99 mg/dL   BUN 17  6 - 23 mg/dL   Creatinine, Ser 7.82  0.50 - 1.10 mg/dL   Calcium 9.2  8.4 - 95.6 mg/dL   Total Protein 6.9  6.0 - 8.3 g/dL   Albumin 3.8  3.5 - 5.2 g/dL   AST 24  0 - 37 U/L   ALT 19  0 - 35 U/L   Alkaline Phosphatase 37 (*) 39 - 117 U/L   Total Bilirubin 0.4  0.3 -  1.2 mg/dL   GFR calc non Af Amer 75 (*) >90 mL/min   GFR calc Af Amer 87 (*) >90 mL/min   Comment:            The eGFR has been calculated     using the CKD EPI equation.     This calculation has not been     validated in all clinical     situations.     eGFR's persistently     <90 mL/min signify     possible Chronic Kidney Disease.   Ct Abdomen Pelvis W Contrast  08/06/2012   *RADIOLOGY REPORT*  Clinical Data: 1-day history of right lower quadrant abdominal pain.  Prior cholecystectomy and laparoscopic gastric banding.  CT ABDOMEN AND PELVIS WITH CONTRAST  Technique:  Multidetector CT imaging of the abdomen and pelvis was performed following the standard protocol during bolus administration of intravenous contrast.  Contrast: 50mL OMNIPAQUE IOHEXOL 300 MG/ML  SOLN, OMNIPAQUE IOHEXOL 300 MG/ML  SOLN IV. Oral contrast was also administered.  Comparison: None.  Findings: Mildly distended appendix measuring up to approximately 11 mm diameter, with edema involving the appendiceal wall, appendicoliths, and mild periappendiceal edema/inflammation.  No evidence of periappendiceal abscess.  No ascites.  Mild diffuse hepatic  steatosis without focal hepatic parenchymal abnormality.  Prior cholecystectomy.  No biliary ductal dilation. Normal-appearing spleen, pancreas, adrenal glands, and kidneys.  No visible aorto-iliofemoral atherosclerosis.  No significant lymphadenopathy.  Prior gastric band procedure.  Scout film shows that the band is appropriately oriented in the 2 o'clock to 8 o'clock position. Very small hiatal hernia.  Stomach otherwise normal in appearance. Normal-appearing small bowel and colon.  Urinary bladder normal in appearance.  Uterus atrophic consistent with age.  No adnexal masses or free pelvic fluid.  Numerous pelvic phleboliths.  Bone window images demonstrate degenerative changes involving the lower thoracic and lumbar spine.  Visualized lung bases clear apart from the expected dependent atelectasis posteriorly.  Heart size upper normal.  IMPRESSION:  1.  Acute appendicitis.  No evidence of periappendiceal abscess. 2.  Prior gastric band procedure without complicating features. 3.  Mild diffuse hepatic steatosis without focal hepatic parenchymal abnormality. 4.  Very small hiatal hernia.  These results were called by telephone on 08/06/2012 at 0930 hours to Dr. Patria Mane of the emergency department, who verbally acknowledged these results.   Original Report Authenticated By: Hulan Saas, M.D.    Review of Systems  Constitutional: Negative.   HENT: Negative.   Eyes: Negative.   Respiratory: Negative.   Cardiovascular: Negative.   Gastrointestinal: Positive for nausea and abdominal pain. Negative for vomiting, diarrhea, constipation, blood in stool and melena.  Genitourinary: Negative.   Musculoskeletal: Negative.   Skin: Negative.   Neurological: Negative.   Endo/Heme/Allergies: Negative.   Psychiatric/Behavioral: Negative.     Blood pressure 123/62, pulse 71, temperature 97.5 F (36.4 C), temperature source Oral, resp. rate 18, SpO2 90.00%. Physical Exam  Constitutional: She is oriented to  person, place, and time. She appears well-developed and well-nourished. No distress.  HENT:  Head: Normocephalic and atraumatic.  Right Ear: External ear normal.  Left Ear: External ear normal.  Mouth/Throat: Oropharynx is clear and moist.  Eyes: Conjunctivae are normal. Pupils are equal, round, and reactive to light. No scleral icterus.  Neck: Normal range of motion. Neck supple. No thyromegaly present.  Cardiovascular: Normal rate, regular rhythm and normal heart sounds.   No murmur heard. Respiratory: Effort normal and breath sounds normal. She has no wheezes.  GI: Soft. Bowel sounds are normal. She exhibits no distension.    Musculoskeletal: Normal range of motion. She exhibits no edema.  Lymphadenopathy:    She has no cervical adenopathy.  Neurological: She is alert and oriented to person, place, and time.  Skin: Skin is warm and dry.  Psychiatric: She has a normal mood and affect. Her behavior is normal.     Assessment/Plan Acute appendicitis  I reviewed the above studies with the patient and her husband at the bedside. We discussed laparoscopic appendectomy. We discussed the possibility of conversion to open surgery. We discussed the hospital stay to be anticipated. They understand and wish to proceed. We will make arrangements with the operating room for laparoscopic appendectomy later today. Patient will be admitted to the general surgery service. She will be started on intravenous antibiotics.  The risks and benefits of the procedure have been discussed at length with the patient.  The patient understands the proposed procedure, potential alternative treatments, and the course of recovery to be expected.  All of the patient's questions have been answered at this time.  The patient wishes to proceed with surgery.  Velora Heckler, MD, [facility] Office: [phone]    Teri Diltz Judie Petit 08/06/2012, 10:34 AM

## 2012-08-06 NOTE — Transfer of Care (Signed)
Immediate Anesthesia Transfer of Care Note  Patient: Taylor Oconnor  Procedure(s) Performed: Procedure(s): APPENDECTOMY LAPAROSCOPIC (N/A)  Patient Location: PACU  Anesthesia Type:General  Level of Consciousness: awake, alert , oriented and patient cooperative  Airway & Oxygen Therapy: Patient Spontanous Breathing and Patient connected to nasal cannula oxygen  Post-op Assessment: Report given to PACU RN and Post -op Vital signs reviewed and stable  Post vital signs: stable  Complications: No apparent anesthesia complications

## 2012-08-06 NOTE — ED Notes (Signed)
MD at bedside. 

## 2012-08-06 NOTE — Op Note (Signed)
OPERATIVE REPORT - LAPAROSCOPIC APPENDECTOMY  Preop diagnosis: Acute appendicitis  Postop diagnosis: Same  Procedure: Laparoscopic appendectomy  Surgeon:  Velora Heckler, MD, FACS  Anesthesia: General endotracheal  Estimated blood loss: Minimal  Preparation: Chlora-prep  Complications: None  Indications:  Patient is a 62 yo WF who presents to the ER with less than 24 hour hx of abdominal pain localizing to the RLQ.  CT abdomen shows acute appendicitis.  Procedure:  Patient is brought to the operating room and placed in a supine position on the operating room table. Following administration of general anesthesia, a time out was held and the patient's name and type of procedure is confirmed. Patient is then prepped and draped in the usual strict aseptic fashion.  After ascertaining that an adequate level of anesthesia been achieved, a peri-umbilical incision is made with a #15 blade. Dissection is carried down to the fascia. Fascia is incised in the midline and the peritoneal cavity is entered cautiously. A #0-vicryl pursestring suture is placed in the fascia. An Hassan cannula is introduced under direct vision and secured with the pursestring suture. Abdomen is insufflated with carbon dioxide. Laparoscope is introduced and the abdomen is explored. Operative ports are placed in the right upper quadrant and left lower quadrant. Appendix is identified. Mesoappendix is divided with the harmonic scalpel. Dissection is carried down to the base of the appendix. The base of the appendix at its junction with the cecal wall was clearly identified. Using an Endo GIA stapler the base of the appendix is transected at its junction with the cecal wall. There is good approximation of tissue along the staple line. There is good hemostasis along the staple line. The appendix is placed into an endo-catch bag and withdrawn through the umbilical port without difficulty. The #0-vicryl pursestring suture is tied  securely.  Right lower quadrant is irrigated with warm saline which is evacuated. Good hemostasis is noted. Ports are removed under direct vision. Good hemostasis is noted at the port sites. Pneumoperitoneum is released.  Skin incisions are anesthetized with local anesthetic. Wounds are closed with interrupted 4-0 Monocryl subcuticular sutures. Wounds were washed and dried and benzoin and Steri-Strips are applied. Sterile dressings are applied. Patient is awakened from anesthesia and brought to the recovery room. The patient tolerated the procedure well.  Velora Heckler, MD, [facility] Office: [phone]

## 2012-08-06 NOTE — ED Notes (Signed)
She c/o rlq area abd. Pain which is very much anterior (not radiating to back or flank, since yesterday evening.  She exhibits guarding behaviors.

## 2012-08-06 NOTE — ED Notes (Signed)
Pt reports that she has only had a "little ginger ale this morning", but nothing else PO

## 2012-08-06 NOTE — ED Provider Notes (Addendum)
CSN: [id number]     Arrival date & time 08/06/12  0744 History     First MD Initiated Contact with Patient 08/06/12 [phone]     Chief Complaint  Patient presents with  . Abdominal Pain    HPI Patient reports worsening right lower quadrant pain over the night.  She was unable to sleep last night.  She states that the abdominal pain started as generalized not focused in the right lower quadrant.  She has a history of cholecystectomy and LAP-BAND.  She still has her appendix.  No fevers or chills.  Nausea without vomiting.  No diarrhea.  No urinary complaints.  No vaginal complaints.  Her pain at this time is moderate to severe and worse with movement and palpation.  No rash.  No other complaints  Past medical history: Hypertension, diabetes Past surgical history: Cholecystectomy, lap band Family history: Noncontributory Social history: No drug abuse, no smoking cigarettes      OB History   No data available     Review of Systems  All other systems reviewed and are negative.    Allergies  Codeine  Home Medications   Current Outpatient Rx  Name  Route  Sig  Dispense  Refill  . acidophilus (RISAQUAD) CAPS   Oral   Take 1 capsule by mouth daily.         Marland Kitchen ALPRAZolam (XANAX) 0.5 MG tablet   Oral   Take 0.5 mg by mouth at bedtime as needed for sleep.         Marland Kitchen atenolol (TENORMIN) 100 MG tablet   Oral   Take 50 mg by mouth daily.         Marland Kitchen b complex vitamins capsule   Oral   Take 1 capsule by mouth daily.         . calcium carbonate (TUMS) 500 MG chewable tablet   Oral   Chew 1,500 mg by mouth at bedtime.         . Cholecalciferol (VITAMIN D3) 5000 UNITS CAPS   Oral   Take 5,000 Units by mouth daily.         . fenofibrate micronized (LOFIBRA) 134 MG capsule   Oral   Take 134 mg by mouth daily before breakfast.         . Ferrous Sulfate (IRON) 325 (65 FE) MG TABS   Oral   Take 325 mg by mouth daily.         . hydrochlorothiazide (HYDRODIURIL)  25 MG tablet   Oral   Take 12.5 mg by mouth daily.         . Magnesium 250 MG TABS   Oral   Take 250 mg by mouth 2 (two) times daily.         . metFORMIN (GLUCOPHAGE) 500 MG tablet   Oral   Take 1,000 mg by mouth daily with breakfast.         . Multiple Vitamins-Minerals (MULTIVITAMIN WITH MINERALS) tablet   Oral   Take 1 tablet by mouth 2 (two) times daily.         . verapamil (CALAN) 80 MG tablet   Oral   Take 80 mg by mouth 2 (two) times daily.          BP 119/73  Pulse 66  Temp(Src) 97.5 F (36.4 C) (Oral)  Resp 21  SpO2 94% Physical Exam  Nursing note and vitals reviewed. Constitutional: She is oriented to person, place, and time. She appears well-developed  and well-nourished. No distress.  HENT:  Head: Normocephalic and atraumatic.  Eyes: EOM are normal.  Neck: Normal range of motion.  Cardiovascular: Normal rate, regular rhythm and normal heart sounds.   Pulmonary/Chest: Effort normal and breath sounds normal.  Abdominal: Soft. She exhibits no distension.  Tenderness with guarding in the right lower quadrant.  No peritoneal signs.  Musculoskeletal: Normal range of motion.  Neurological: She is alert and oriented to person, place, and time.  Skin: Skin is warm and dry.  Psychiatric: She has a normal mood and affect. Judgment normal.    ED Course   Procedures (including critical care time)  Labs Reviewed  CBC WITH DIFFERENTIAL - Abnormal; Notable for the following:    WBC 11.4 (*)    Platelets 431 (*)    Neutrophils Relative % 81 (*)    Neutro Abs 9.2 (*)    All other components within normal limits  COMPREHENSIVE METABOLIC PANEL - Abnormal; Notable for the following:    Glucose, Bld 118 (*)    Alkaline Phosphatase 37 (*)    GFR calc non Af Amer 75 (*)    GFR calc Af Amer 87 (*)    All other components within normal limits  URINALYSIS, ROUTINE W REFLEX MICROSCOPIC   Ct Abdomen Pelvis W Contrast  08/06/2012   *RADIOLOGY REPORT*  Clinical  Data: 1-day history of right lower quadrant abdominal pain.  Prior cholecystectomy and laparoscopic gastric banding.  CT ABDOMEN AND PELVIS WITH CONTRAST  Technique:  Multidetector CT imaging of the abdomen and pelvis was performed following the standard protocol during bolus administration of intravenous contrast.  Contrast: 50mL OMNIPAQUE IOHEXOL 300 MG/ML  SOLN, OMNIPAQUE IOHEXOL 300 MG/ML  SOLN IV. Oral contrast was also administered.  Comparison: None.  Findings: Mildly distended appendix measuring up to approximately 11 mm diameter, with edema involving the appendiceal wall, appendicoliths, and mild periappendiceal edema/inflammation.  No evidence of periappendiceal abscess.  No ascites.  Mild diffuse hepatic steatosis without focal hepatic parenchymal abnormality.  Prior cholecystectomy.  No biliary ductal dilation. Normal-appearing spleen, pancreas, adrenal glands, and kidneys.  No visible aorto-iliofemoral atherosclerosis.  No significant lymphadenopathy.  Prior gastric band procedure.  Scout film shows that the band is appropriately oriented in the 2 o'clock to 8 o'clock position. Very small hiatal hernia.  Stomach otherwise normal in appearance. Normal-appearing small bowel and colon.  Urinary bladder normal in appearance.  Uterus atrophic consistent with age.  No adnexal masses or free pelvic fluid.  Numerous pelvic phleboliths.  Bone window images demonstrate degenerative changes involving the lower thoracic and lumbar spine.  Visualized lung bases clear apart from the expected dependent atelectasis posteriorly.  Heart size upper normal.  IMPRESSION:  1.  Acute appendicitis.  No evidence of periappendiceal abscess. 2.  Prior gastric band procedure without complicating features. 3.  Mild diffuse hepatic steatosis without focal hepatic parenchymal abnormality. 4.  Very small hiatal hernia.  These results were called by telephone on 08/06/2012 at 0930 hours to Dr. Patria Mane of the emergency department,  who verbally acknowledged these results.   Original Report Authenticated By: Hulan Saas, M.D.   I personally reviewed the imaging tests through PACS system I reviewed available ER/hospitalization records through the EMR   1. Appendicitis     MDM  CT scan demonstrates acute appendicitis.  Patient will be admitted to general surgery and taken to the OR for appendectomy.   Lyanne Co, MD 08/06/12 1610  Lyanne Co, MD 08/06/12 [phone]

## 2012-08-07 MED ORDER — AMOXICILLIN-POT CLAVULANATE 875-125 MG PO TABS: 1.0000 | ORAL_TABLET | Freq: Two times a day (BID) | ORAL | Status: DC

## 2012-08-07 MED ORDER — HYDROCODONE-ACETAMINOPHEN 5-325 MG PO TABS
1.0000 | ORAL_TABLET | ORAL | Status: DC | PRN
Start: 2012-08-07 — End: 2012-08-12

## 2012-08-07 MED ORDER — HYDROCHLOROTHIAZIDE 12.5 MG PO CAPS
12.5000 mg | ORAL_CAPSULE | Freq: Every day | ORAL | Status: DC
  Administered 2012-08-07: 12.5 mg via ORAL
  Filled 2012-08-07: qty 1

## 2012-08-07 NOTE — Discharge Summary (Signed)
Physician Discharge Summary [facility]  Patient ID: Taylor Oconnor MRN: [id number] DOB/AGE: 08-07-1950 62 y.o.  Admit date: 08/06/2012 Discharge date: 08/07/2012  Admission Diagnoses:  Acute appendicitis  Discharge Diagnoses:  Principal Problem:   Appendicitis, acute   Discharged Condition: good  Hospital Course: patient admitted from the ER with signs and symptoms of acute appendicitis.  Prepared for the OR and underwent laparoscopic appendectomy.  Post op course uncomplicated.  Tolerated diet.  Pain well controlled.  Plan discharge home on afternoon of POD#1.  Will receive 5 day course of Augmentin post op.  Consults: None  Significant Diagnostic Studies: CTA  Treatments: surgery: lap appendectomy  Discharge Exam: Blood pressure 119/65, pulse 86, temperature 97.8 F (36.6 C), temperature source Oral, resp. rate 18, height 5\' 1"  (1.549 m), weight 181 lb (82.101 kg), SpO2 99.00%. HEENT - clear Chest - clear Cor - RRR Abd - soft, obese, dressings dry and intact, minimal tenderness RLQ  Disposition: Home with family  Discharge Orders   Future Orders Complete By Expires     Diet - low sodium heart healthy  As directed     Discharge instructions  As directed     Comments:      [facility]  LAPAROSCOPIC SURGERY - POST-OP INSTRUCTIONS  Always review your discharge instruction sheet given to you by the facility where your surgery was performed.  A prescription for pain medication may be given to you upon discharge.  Take your pain medication as prescribed.  If narcotic pain medicine is not needed, then you may take acetaminophen (Tylenol) or ibuprofen (Advil) as needed.  Take your usually prescribed medications unless otherwise directed.  If you need a refill on your pain medication, please contact your pharmacy.  They will contact our office to request authorization. Prescriptions will not be filled after 5 P.M. or on weekends.  You  should follow a light diet the first few days after arrival home, such as soup and crackers or toast.  Be sure to include plenty of fluids daily.  Most patients will experience some swelling and bruising in the area of the incisions.  Ice packs will help.  Swelling and bruising can take several days to resolve.   It is common to experience some constipation if taking pain medication after surgery.  Increasing fluid intake and taking a stool softener (such as Colace) will usually help or prevent this problem from occurring.  A mild laxative (Milk of Magnesia or Miralax) should be taken according to package instructions if there are no bowel movements after 48 hours.  Unless discharge instructions indicate otherwise, you may remove your bandages 24-48 hours after surgery, and you may shower at that time.  You may have steri-strips (small skin tapes) in place directly over the incision.  These strips should be left on the skin for 7-10 days.  If your surgeon used skin glue on the incision, you may shower in 24 hours.  The glue will flake off over the next 2-3 weeks.  Any sutures or staples will be removed at the office during your follow-up visit.  ACTIVITIES:  You may resume regular (light) daily activities beginning the next day-such as daily self-care, walking, climbing stairs-gradually increasing activities as tolerated.  You may have sexual intercourse when it is comfortable.  Refrain from any heavy lifting or straining until approved by your doctor.  You may drive when you are no longer taking prescription pain medication, you can comfortably wear a  seatbelt, and you can safely maneuver your car and apply brakes.  You should see your doctor in the office for a follow-up appointment approximately 2-3 weeks after your surgery.  Make sure that you call for this appointment within a day or two after you arrive home to insure a convenient appointment time.  WHEN TO CALL YOUR DOCTOR: Fever over  101.0 Inability to urinate Continued bleeding from incision Increased pain, redness, or drainage from the incision Increasing abdominal pain  The clinic staff is available to answer your questions during regular business hours.  Please don't hesitate to call and ask to speak to one of the nurses for clinical concerns.  If you have a medical emergency, go to the nearest emergency room or call 911.  A surgeon from [facility] is always on call for the hospital.  Velora Heckler, MD, [facility] Office: [phone] Toll Free:  [phone] FAX [phone]  Web site: [email or web address]    Increase activity slowly  As directed     Remove dressing in 24 hours  As directed         Medication List         acidophilus Caps  Take 1 capsule by mouth daily.     ALPRAZolam 0.5 MG tablet  Commonly known as:  XANAX  Take 0.5 mg by mouth at bedtime as needed for sleep.     amoxicillin-clavulanate 875-125 MG per tablet  Commonly known as:  AUGMENTIN  Take 1 tablet by mouth 2 (two) times daily.     atenolol 100 MG tablet  Commonly known as:  TENORMIN  Take 50 mg by mouth daily.     b complex vitamins capsule  Take 1 capsule by mouth daily.     fenofibrate micronized 134 MG capsule  Commonly known as:  LOFIBRA  Take 134 mg by mouth daily before breakfast.     hydrochlorothiazide 25 MG tablet  Commonly known as:  HYDRODIURIL  Take 12.5 mg by mouth daily.     HYDROcodone-acetaminophen 5-325 MG per tablet  Commonly known as:  NORCO/VICODIN  Take 1-2 tablets by mouth every 4 (four) hours as needed for pain.     Iron 325 (65 FE) MG Tabs  Take 325 mg by mouth daily.     Magnesium 250 MG Tabs  Take 250 mg by mouth 2 (two) times daily.     metFORMIN 500 MG tablet  Commonly known as:  GLUCOPHAGE  Take 1,000 mg by mouth daily with breakfast.     multivitamin with minerals tablet  Take 1 tablet by mouth 2 (two) times daily.      TUMS 500 MG chewable tablet  Generic drug:  calcium carbonate  Chew 1,500 mg by mouth at bedtime.     verapamil 80 MG tablet  Commonly known as:  CALAN  Take 80 mg by mouth 2 (two) times daily.     Vitamin D3 5000 UNITS Caps  Take 5,000 Units by mouth daily.         Velora Heckler, MD, [facility] Office: [phone]   Signed: Velora Heckler 08/07/2012, 8:15 AM

## 2012-08-08 ENCOUNTER — Telehealth (INDEPENDENT_AMBULATORY_CARE_PROVIDER_SITE_OTHER): Payer: Self-pay

## 2012-08-08 ENCOUNTER — Encounter (HOSPITAL_COMMUNITY): Payer: Self-pay | Admitting: Surgery

## 2012-08-08 NOTE — Telephone Encounter (Signed)
Patient calling back. She is having no tongue discoloration just some swelling and pain in tongue and throat. She states it feels raw. She states this happens every time she has surgery. This started yesterday. Please advise. [phone].

## 2012-08-08 NOTE — Telephone Encounter (Signed)
Patient is stating she has mouth /tongue soreness . She is requesting Magic mouth wash to be called to [facility]  [place] ; please advise

## 2012-08-08 NOTE — Telephone Encounter (Signed)
I called pt and spoke with her re: soreness. Pt advised this is soo soon after surgery it is probably from intubation.  She will try warm salt water gargle and swish and use throat lozenge as needed. Pt will call back if she notes any redness,swelling,rash or difficulty swallowing. Pt states she understands.

## 2012-08-08 NOTE — Telephone Encounter (Signed)
This msg has been paged to Dr Gerrit Friends to review. This is probably from irritation when pt was intubated.

## 2012-08-12 ENCOUNTER — Telehealth (INDEPENDENT_AMBULATORY_CARE_PROVIDER_SITE_OTHER): Payer: Self-pay | Admitting: *Deleted

## 2012-08-12 ENCOUNTER — Other Ambulatory Visit (INDEPENDENT_AMBULATORY_CARE_PROVIDER_SITE_OTHER): Payer: Self-pay | Admitting: *Deleted

## 2012-08-12 MED ORDER — HYDROCODONE-ACETAMINOPHEN 5-325 MG PO TABS: 1.0000 | ORAL_TABLET | ORAL | Status: DC | PRN

## 2012-08-12 NOTE — Telephone Encounter (Signed)
Patient called in to request refill of pain medication at this time since her surgery on 08/06/12.  Patient has not yet received a refill so per protocol Norco 5/325mg  1 tablet every 4-6 hours as needed for pain #30 no refills called into [facility] [phone]

## 2012-08-26 ENCOUNTER — Ambulatory Visit (INDEPENDENT_AMBULATORY_CARE_PROVIDER_SITE_OTHER): Payer: 59 | Admitting: Surgery

## 2012-08-26 ENCOUNTER — Encounter (INDEPENDENT_AMBULATORY_CARE_PROVIDER_SITE_OTHER): Payer: Self-pay | Admitting: Surgery

## 2012-08-26 VITALS — BP 104/60 | HR 76 | Resp 16 | Ht 63.75 in | Wt 179.8 lb

## 2012-08-26 DIAGNOSIS — Z9884 Bariatric surgery status: Secondary | ICD-10-CM | POA: Insufficient documentation

## 2012-08-26 NOTE — Patient Instructions (Signed)

## 2012-08-26 NOTE — Progress Notes (Signed)
Taylor Oconnor 62 y.o.  Body mass index is 31.11 kg/(m^2).  Patient Active Problem List   Diagnosis Date Noted  . Lapband APS + [facility] repair Nov 2008 08/26/2012  . Appendicitis, acute 08/06/2012    Allergies  Allergen Reactions  . Codeine Itching    Past Surgical History  Procedure Laterality Date  . Cholecystectomy    . Diagnostic laparoscopy    . Hernia repair    . Tubal ligation    . Back surgery    . Laparoscopic appendectomy N/A 08/06/2012    Procedure: APPENDECTOMY LAPAROSCOPIC;  Surgeon: Velora Heckler, MD;  Location: [facility];  Service: General;  Laterality: N/A;  . Appendectomy  08/06/12    lap appy  . Laparoscopic gastric banding with hiatal hernia repair N/A Nov 2008   Nadean Corwin, MD 1. Lapband APS + [facility] repair Nov 2008     Followup visit for [name] who has not been seen since 2009.  It seems that her last band fill was overfilled and she had to have some of it removed. However on her weight scale she is now down to 179.8 pounds. She is having to take phentermine. We discussed her restriction and she has got some good habits: Including avoidance of sugars. Since she has not had a band fill in 5 years I went ahead and discuss this with her and added 0.2 cc to her APS band.   She is not having any GERD.  Will recheck her is 3 months.    She had a recent appendectomy by Darnell Level and has done well.  Incisions are healed.   Matt B. Daphine Deutscher, MD, [facility] [phone] beeper [phone]  08/26/2012 10:25 AM

## 2012-10-04 ENCOUNTER — Other Ambulatory Visit: Payer: Self-pay

## 2012-10-04 DIAGNOSIS — Z1231 Encounter for screening mammogram for malignant neoplasm of breast: Secondary | ICD-10-CM

## 2012-11-10 ENCOUNTER — Ambulatory Visit
Admission: RE | Admit: 2012-11-10 | Discharge: 2012-11-10 | Disposition: A | Discharge status: Home / Self Care | Payer: 59 | Source: Ambulatory Visit

## 2012-11-10 DIAGNOSIS — Z1231 Encounter for screening mammogram for malignant neoplasm of breast: Secondary | ICD-10-CM

## 2012-11-10 LAB — HM MAMMOGRAPHY: HM Mammogram: NORMAL

## 2012-11-22 ENCOUNTER — Other Ambulatory Visit: Payer: Self-pay | Admitting: Internal Medicine

## 2012-11-25 ENCOUNTER — Ambulatory Visit (INDEPENDENT_AMBULATORY_CARE_PROVIDER_SITE_OTHER): Payer: 59 | Admitting: Surgery

## 2012-11-25 ENCOUNTER — Encounter (INDEPENDENT_AMBULATORY_CARE_PROVIDER_SITE_OTHER): Payer: Self-pay | Admitting: Surgery

## 2012-11-25 ENCOUNTER — Encounter (INDEPENDENT_AMBULATORY_CARE_PROVIDER_SITE_OTHER): Payer: Self-pay

## 2012-11-25 VITALS — BP 117/77 | HR 70 | Temp 98.1°F | Resp 16 | Ht 64.0 in | Wt 170.6 lb

## 2012-11-25 DIAGNOSIS — Z9884 Bariatric surgery status: Secondary | ICD-10-CM

## 2012-11-25 NOTE — Progress Notes (Signed)
Taylor Oconnor 62 y.o.  Body mass index is 29.27 kg/(m^2).  Patient Active Problem List   Diagnosis Date Noted  . Lapband APS + [facility] repair Nov 2008 08/26/2012  . Appendicitis, acute 08/06/2012    Allergies  Allergen Reactions  . Codeine Itching    Past Surgical History  Procedure Laterality Date  . Cholecystectomy    . Diagnostic laparoscopy    . Hernia repair    . Tubal ligation    . Back surgery    . Laparoscopic appendectomy N/A 08/06/2012    Procedure: APPENDECTOMY LAPAROSCOPIC;  Surgeon: Velora Heckler, MD;  Location: [facility];  Service: General;  Laterality: N/A;  . Appendectomy  08/06/12    lap appy  . Laparoscopic gastric banding with hiatal hernia repair N/A Nov 2008   MCKEOWN,WILLIAM DAVID, MD No diagnosis found.  Has lost 10 lbs since last visit.  Currently down to 170.  She is on Phentermine per [place].  This has tamed her hunger.  She is not having any reflux.  Will see her back if needed when she is on Phentermine break if she is having cravings and needs a band fill.  Matt B. Daphine Deutscher, MD, [facility] [phone] beeper [phone]  11/25/2012 2:48 PM

## 2012-11-25 NOTE — Patient Instructions (Signed)
Thanks for your patience.  If you need further assistance after leaving the office, please call our office and speak with a [facility] nurse.  [phone].  If you want to leave a message for Dr. Daphine Deutscher, please call his office phone at [phone].  Call and see Dr. Daphine Deutscher if you are experiencing food cravings when you are off your Phentermine.

## 2012-12-13 ENCOUNTER — Encounter: Payer: Self-pay | Admitting: Internal Medicine

## 2012-12-14 ENCOUNTER — Ambulatory Visit (INDEPENDENT_AMBULATORY_CARE_PROVIDER_SITE_OTHER): Payer: 59 | Admitting: Internal Medicine

## 2012-12-14 ENCOUNTER — Encounter: Payer: Self-pay | Admitting: Internal Medicine

## 2012-12-14 ENCOUNTER — Other Ambulatory Visit (HOSPITAL_COMMUNITY)
Admission: RE | Admit: 2012-12-14 | Discharge: 2012-12-14 | Disposition: A | Discharge status: Home / Self Care | Payer: 59 | Source: Ambulatory Visit | Attending: Internal Medicine | Admitting: Internal Medicine

## 2012-12-14 VITALS — BP 128/82 | HR 84 | Temp 98.1°F | Resp 18 | Ht 63.5 in | Wt 170.6 lb

## 2012-12-14 DIAGNOSIS — R7401 Elevation of levels of liver transaminase levels: Secondary | ICD-10-CM

## 2012-12-14 DIAGNOSIS — Z111 Encounter for screening for respiratory tuberculosis: Secondary | ICD-10-CM

## 2012-12-14 DIAGNOSIS — E119 Type 2 diabetes mellitus without complications: Secondary | ICD-10-CM

## 2012-12-14 DIAGNOSIS — I10 Essential (primary) hypertension: Secondary | ICD-10-CM

## 2012-12-14 DIAGNOSIS — Z124 Encounter for screening for malignant neoplasm of cervix: Secondary | ICD-10-CM

## 2012-12-14 DIAGNOSIS — E559 Vitamin D deficiency, unspecified: Secondary | ICD-10-CM

## 2012-12-14 DIAGNOSIS — Z0000 Encounter for general adult medical examination without abnormal findings: Secondary | ICD-10-CM

## 2012-12-14 DIAGNOSIS — K649 Unspecified hemorrhoids: Secondary | ICD-10-CM

## 2012-12-14 DIAGNOSIS — Z79899 Other long term (current) drug therapy: Secondary | ICD-10-CM

## 2012-12-14 DIAGNOSIS — E782 Mixed hyperlipidemia: Secondary | ICD-10-CM

## 2012-12-14 DIAGNOSIS — Z01419 Encounter for gynecological examination (general) (routine) without abnormal findings: Secondary | ICD-10-CM | POA: Insufficient documentation

## 2012-12-14 DIAGNOSIS — Z1212 Encounter for screening for malignant neoplasm of rectum: Secondary | ICD-10-CM

## 2012-12-14 DIAGNOSIS — Z113 Encounter for screening for infections with a predominantly sexual mode of transmission: Secondary | ICD-10-CM

## 2012-12-14 DIAGNOSIS — Z23 Encounter for immunization: Secondary | ICD-10-CM

## 2012-12-14 DIAGNOSIS — E1129 Type 2 diabetes mellitus with other diabetic kidney complication: Secondary | ICD-10-CM

## 2012-12-14 HISTORY — DX: Type 2 diabetes mellitus with other diabetic kidney complication: E11.29

## 2012-12-14 HISTORY — DX: Type 2 diabetes mellitus without complications: E11.9

## 2012-12-14 LAB — CBC WITH DIFFERENTIAL/PLATELET
Basophils Absolute: 0 10*3/uL (ref 0.0–0.1)
Eosinophils Absolute: 0.1 10*3/uL (ref 0.0–0.7)
Eosinophils Relative: 1 % (ref 0–5)
Hemoglobin: 13.6 g/dL (ref 12.0–15.0)
Lymphocytes Relative: 42 % (ref 12–46)
Lymphs Abs: 3.1 10*3/uL (ref 0.7–4.0)
MCH: 30 pg (ref 26.0–34.0)
MCV: 84.6 fL (ref 78.0–100.0)
Neutrophils Relative %: 50 % (ref 43–77)
Platelets: 510 10*3/uL — ABNORMAL HIGH (ref 150–400)
RBC: 4.54 MIL/uL (ref 3.87–5.11)
RDW: 13 % (ref 11.5–15.5)
WBC: 7.3 10*3/uL (ref 4.0–10.5)

## 2012-12-14 MED ORDER — HYDROCORTISONE 2.5 % RE CREA: 1.0000 "application " | TOPICAL_CREAM | Freq: Four times a day (QID) | RECTAL | Status: DC

## 2012-12-14 NOTE — Progress Notes (Signed)
Patient ID: Taylor Oconnor, female   DOB: 06-09-1950, 62 y.o.   MRN: [id number]  Annual Screening Comprehensive Examination  This very nice 62 yo mWF presents for complete physical.  Patient has been followed for HTN, Diabetes, Hyperlipidemia, and Vitamin D Deficiency.   Patient's BP (1995) has been controlled at home on diuretic therapy. Patient denies any cardiac Symptoms as chest pain, palpitations, shortness of breath, dizziness or ankle swelling.   Patient's hyperlipidemia is controlled with diet and medications. Patient denies myalgias or other medication SE's. Last cholesterol last visit was 208, triglycerides  98, HDL  78 and LDL 110.     Patient has T2 Diabetes with last A1c  5.9% in August Patient has lost 30 # over the last year and 60 # since gastric bipass surg (6.0% may 2011), ince gastric surg since atient denies reactive hypoglycemic symptoms, visual blurring, diabetic polys, or paresthesias.    Patient has Diabetes predating from 2008 with last A1c 5.9% in August 2014. Patient denies reactive hypoglycemic symptoms, visual blurring, diabetic polys, or paresthesias.   Finally, patient has history of Vitamin D Deficiency with last vitamin D 84 in August (was 28 in August 2008).     Medication Sig  . acidophilus (RISAQUAD) CAPS Take 1 capsule by mouth daily.  Marland Kitchen ALPRAZolam (XANAX) 1 MG tablet TAKE 1/2 TO 1 TABLET 3 TIMES A DAY AS NEEDED  . atenolol (TENORMIN) 100 MG tablet Take 50 mg by mouth daily.  Marland Kitchen b complex vitamins capsule Take 1 capsule by mouth daily.  . calcium carbonate (TUMS) 500 MG chewable tablet Chew 1,500 mg by mouth at bedtime.  . Cholecalciferol (VITAMIN D3) 5000 UNITS CAPS Take 5,000 Units by mouth daily.  . fenofibrate micronized (LOFIBRA) 134 MG capsule Take 134 mg by mouth daily before breakfast.  . Ferrous Sulfate (IRON) 325 (65 FE) MG TABS Take 325 mg by mouth daily.  . hydrochlorothiazide (HYDRODIURIL) 25 MG tablet Take 12.5 mg by mouth daily.  .  Magnesium 250 MG TABS Take 250 mg by mouth 2 (two) times daily.  . metFORMIN (GLUCOPHAGE) 500 MG tablet Take 1,000 mg by mouth daily with breakfast.  . Multiple Vitamins-Minerals (MULTIVITAMIN WITH MINERALS) tablet Take 1 tablet by mouth 2 (two) times daily.  . verapamil (CALAN) 80 MG tablet Take 80 mg by mouth 2 (two) times daily.     Allergies  Allergen Reactions  . Codeine Itching  . Lipitor [Atorvastatin] Other (See Comments)    Change in memory.  . Ace Inhibitors Rash    Past Medical History  Diagnosis Date  . Hypertension   . GERD (gastroesophageal reflux disease)   . H/O hiatal hernia   . 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 At risk for difficult insertion of breathing tube   . Type II or unspecified type diabetes mellitus without mention of complication, not stated as uncontrolled 12/14/2012    Past Surgical History  Procedure Laterality Date  . Cholecystectomy    . Diagnostic laparoscopy    . Hernia repair    . Tubal ligation    . Back surgery    . Laparoscopic appendectomy N/A 08/06/2012    Procedure: APPENDECTOMY LAPAROSCOPIC;  Surgeon: Velora Heckler, MD;  Location: [facility];  Service: General;  Laterality: N/A;  . Appendectomy  08/06/12    lap appy  . Laparoscopic gastric banding with hiatal hernia repair N/A Nov 2008    Family History  Problem Relation Age of Onset  . Cancer Cousin  Breast  . Cirrhosis Mother   . Alcohol abuse Mother   . Diabetes Father   . Hypertension Father   . Stroke Father   . AAA (abdominal aortic aneurysm) Sister     History  Substance Use Topics  . Smoking status: Never Smoker   . Smokeless tobacco: Never Used  . Alcohol Use: No    ROS Constitutional: Denies fever, chills, weight loss/gain, headaches, insomnia, fatigue, night sweats, and change in appetite. Eyes: Denies redness, blurred vision, diplopia, discharge, itchy, watery eyes.  ENT: Denies discharge, congestion, post nasal drip, epistaxis, sore throat,  earache, hearing loss, dental pain, Tinnitus, Vertigo, Sinus pain, snoring.  Cardio: Denies chest pain, palpitations, irregular heartbeat, syncope, dyspnea, diaphoresis, orthopnea, PND, claudication, edema Respiratory: denies cough, dyspnea, DOE, pleurisy, hoarseness, laryngitis, wheezing.  Gastrointestinal: Denies dysphagia, heartburn, reflux, water brash, pain, cramps, nausea, vomiting, bloating, diarrhea, constipation, hematemesis, melena, hematochezia, jaundice, hemorrhoids Genitourinary: Denies dysuria, frequency, urgency, nocturia, hesitancy, discharge, hematuria, flank pain Breast:Breast lumps, nipple discharge, bleeding.  Musculoskeletal: Denies arthralgia, myalgia, stiffness, Jt. Swelling, pain, limp, and strain/sprain. Skin: Denies puritis, rash, hives, warts, acne, eczema, changing in skin lesion Neuro: No weakness, tremor, incoordination, spasms, paresthesia, pain Psychiatric: Denies confusion, memory loss, sensory loss Endocrine: Denies change in weight, skin, hair change, nocturia, and paresthesia, diabetic polys, visual blurring, hyper / hypo glycemic episodes.  Heme/Lymph: No excessive bleeding, bruising, enlarged lymph nodes.  Filed Vitals:   12/14/12 1421  BP: 128/82  Pulse: 84  Temp: 98.1 F (36.7 C)  Resp: 18   BMI  29.74 kg/(m^2)    Weight   170.6 lb.s Height     5' 3.5" Physical Exam General Appearance: Well nourished, in no apparent distress. Eyes: PERRLA, EOMs, conjunctiva no swelling or erythema, normal fundi and vessels. Sinuses: No frontal/maxillary tenderness ENT/Mouth: EACs patent / TMs  nl. Nares clear without erythema, swelling, mucoid exudates. Oral hygiene is good. No erythema, swelling, or exudate. Tongue normal, non-obstructing. Tonsils not swollen or erythematous. Hearing normal.  Neck: Supple, thyroid normal. No bruits, nodes or JVD. Respiratory: Respiratory effort normal.  BS equal and clear bilateral without rales, rhonci, wheezing or  stridor. Cardio: Heart sounds are normal with regular rate and rhythm and no murmurs, rubs or gallops. Peripheral pulses are normal and equal bilaterally without edema. No aortic or femoral bruits. Chest: symmetric with normal excursions and percussion. Breasts: Symmetric, without lumps, nipple discharge, retractions, or fibrocystic changes.  Abdomen: Flat, soft, with bowl sounds. Nontender, no guarding, rebound, hernias, masses, or organomegaly.  Lymphatics: Non tender without lymphadenopathy.  Musculoskeletal: Full ROM all peripheral extremities, joint stability, 5/5 strength, and normal gait. Skin: Warm and dry without rashes, lesions, cyanosis, clubbing or  ecchymosis.  Neuro: Cranial nerves intact, reflexes equal bilaterally. Normal muscle tone, no cerebellar symptoms. Sensation intact.  Pysch: Awake and oriented X 3, normal affect, Insight and Judgment appropriate.   Assessment and Plan  1. Annual Screening Examination 2. Hypertension  3. Hyperlipidemia 4. T2 Diabetes 5. Vitamin D Deficiency  Continue prudent diet as discussed, weight control, regular exercise, and medications. Discussed med's effects and SE's. Screening labs and tests as requested with regular follow-up as recommended.

## 2012-12-14 NOTE — Patient Instructions (Signed)

## 2012-12-15 LAB — BASIC METABOLIC PANEL WITH GFR
Calcium: 10 mg/dL (ref 8.4–10.5)
Chloride: 99 mEq/L (ref 96–112)
Creat: 0.87 mg/dL (ref 0.50–1.10)
GFR, Est African American: 83 mL/min
GFR, Est Non African American: 72 mL/min
Sodium: 139 mEq/L (ref 135–145)

## 2012-12-15 LAB — INSULIN, FASTING: Insulin fasting, serum: 10 u[IU]/mL (ref 3–28)

## 2012-12-15 LAB — HEPATIC FUNCTION PANEL
Albumin: 4.4 g/dL (ref 3.5–5.2)
Bilirubin, Direct: 0.1 mg/dL (ref 0.0–0.3)
Total Bilirubin: 0.4 mg/dL (ref 0.3–1.2)

## 2012-12-15 LAB — URINALYSIS, MICROSCOPIC ONLY
Bacteria, UA: NONE SEEN
Casts: NONE SEEN
Crystals: NONE SEEN
Squamous Epithelial / LPF: NONE SEEN

## 2012-12-15 LAB — RPR

## 2012-12-15 LAB — VITAMIN D 25 HYDROXY (VIT D DEFICIENCY, FRACTURES): Vit D, 25-Hydroxy: 98 ng/mL — ABNORMAL HIGH (ref 30–89)

## 2012-12-15 LAB — HIV ANTIBODY (ROUTINE TESTING W REFLEX): HIV: NONREACTIVE

## 2012-12-15 LAB — LIPID PANEL
HDL: 80 mg/dL (ref >39)
Total CHOL/HDL Ratio: 2.9 Ratio
Triglycerides: 103 mg/dL (ref <150)

## 2012-12-15 LAB — MICROALBUMIN / CREATININE URINE RATIO
Creatinine, Urine: 32.2 mg/dL
Microalb Creat Ratio: 15.5 mg/g (ref 0.0–30.0)

## 2012-12-15 LAB — MAGNESIUM: Magnesium: 2.1 mg/dL (ref 1.5–2.5)

## 2012-12-16 LAB — HEPATITIS B E ANTIBODY: Hepatitis Be Antibody: NEGATIVE

## 2012-12-16 LAB — TB SKIN TEST: Induration: 0 mm

## 2013-01-02 ENCOUNTER — Other Ambulatory Visit: Payer: Self-pay | Admitting: Internal Medicine

## 2013-01-02 DIAGNOSIS — Z1212 Encounter for screening for malignant neoplasm of rectum: Secondary | ICD-10-CM

## 2013-01-02 LAB — POC HEMOCCULT BLD/STL (HOME/3-CARD/SCREEN): Card #3 Fecal Occult Blood, POC: NEGATIVE

## 2013-01-07 ENCOUNTER — Other Ambulatory Visit: Payer: Self-pay | Admitting: Internal Medicine

## 2013-01-08 ENCOUNTER — Other Ambulatory Visit: Payer: Self-pay | Admitting: Emergency Medicine

## 2013-01-08 MED ORDER — METFORMIN HCL 500 MG PO TABS: 1000.0000 mg | ORAL_TABLET | Freq: Two times a day (BID) | ORAL | Status: DC

## 2013-01-09 ENCOUNTER — Other Ambulatory Visit: Payer: Self-pay | Admitting: Internal Medicine

## 2013-01-09 ENCOUNTER — Other Ambulatory Visit: Payer: Self-pay | Admitting: Emergency Medicine

## 2013-01-09 MED ORDER — ALPRAZOLAM 1 MG PO TABS: ORAL_TABLET | ORAL | Status: DC

## 2013-02-16 ENCOUNTER — Encounter: Payer: Self-pay | Admitting: Emergency Medicine

## 2013-02-16 ENCOUNTER — Ambulatory Visit (INDEPENDENT_AMBULATORY_CARE_PROVIDER_SITE_OTHER): Payer: 59 | Admitting: Emergency Medicine

## 2013-02-16 ENCOUNTER — Telehealth: Payer: Self-pay

## 2013-02-16 VITALS — BP 120/72 | HR 98 | Temp 98.2°F | Resp 16 | Ht 63.5 in | Wt 166.0 lb

## 2013-02-16 DIAGNOSIS — R5383 Other fatigue: Principal | ICD-10-CM

## 2013-02-16 DIAGNOSIS — H8110 Benign paroxysmal vertigo, unspecified ear: Secondary | ICD-10-CM

## 2013-02-16 DIAGNOSIS — R5381 Other malaise: Secondary | ICD-10-CM

## 2013-02-16 LAB — CBC WITH DIFFERENTIAL/PLATELET
BASOS PCT: 0 % (ref 0–1)
Basophils Absolute: 0 10*3/uL (ref 0.0–0.1)
Eosinophils Absolute: 0.1 10*3/uL (ref 0.0–0.7)
Eosinophils Relative: 1 % (ref 0–5)
HCT: 41.4 % (ref 36.0–46.0)
Hemoglobin: 13.9 g/dL (ref 12.0–15.0)
Lymphocytes Relative: 45 % (ref 12–46)
Lymphs Abs: 3.1 10*3/uL (ref 0.7–4.0)
MCH: 30.2 pg (ref 26.0–34.0)
MCHC: 33.6 g/dL (ref 30.0–36.0)
MCV: 89.8 fL (ref 78.0–100.0)
Monocytes Absolute: 0.6 10*3/uL (ref 0.1–1.0)
Monocytes Relative: 8 % (ref 3–12)
NEUTROS PCT: 46 % (ref 43–77)
Neutro Abs: 3.1 10*3/uL (ref 1.7–7.7)
PLATELETS: 486 10*3/uL — AB (ref 150–400)
RBC: 4.61 MIL/uL (ref 3.87–5.11)
RDW: 13.9 % (ref 11.5–15.5)
WBC: 6.9 10*3/uL (ref 4.0–10.5)

## 2013-02-16 LAB — BASIC METABOLIC PANEL WITH GFR
BUN: 15 mg/dL (ref 6–23)
CO2: 29 mEq/L (ref 19–32)
Calcium: 9.8 mg/dL (ref 8.4–10.5)
Chloride: 101 mEq/L (ref 96–112)
Creat: 0.88 mg/dL (ref 0.50–1.10)
GFR, EST AFRICAN AMERICAN: 82 mL/min
GFR, Est Non African American: 71 mL/min
GLUCOSE: 85 mg/dL (ref 70–99)
Potassium: 4 mEq/L (ref 3.5–5.3)
Sodium: 139 mEq/L (ref 135–145)

## 2013-02-16 LAB — HEPATIC FUNCTION PANEL
ALT: 18 U/L (ref 0–35)
AST: 17 U/L (ref 0–37)
Albumin: 4.2 g/dL (ref 3.5–5.2)
Alkaline Phosphatase: 40 U/L (ref 39–117)
BILIRUBIN DIRECT: 0.1 mg/dL (ref 0.0–0.3)
BILIRUBIN INDIRECT: 0.2 mg/dL (ref 0.2–1.2)
TOTAL PROTEIN: 6.9 g/dL (ref 6.0–8.3)
Total Bilirubin: 0.3 mg/dL (ref 0.2–1.2)

## 2013-02-16 MED ORDER — DEXAMETHASONE SODIUM PHOSPHATE 100 MG/10ML IJ SOLN
10.0000 mg | Freq: Once | INTRAMUSCULAR | Status: AC
  Administered 2013-02-16: 10 mg via INTRAMUSCULAR

## 2013-02-16 MED ORDER — PHENTERMINE HCL 37.5 MG PO CAPS: 37.5000 mg | ORAL_CAPSULE | ORAL | Status: DC

## 2013-02-16 NOTE — Progress Notes (Signed)
Subjective:    Patient ID: Taylor Oconnor, female    DOB: August 18, 1950, 63 y.o.   MRN: [id number]  HPI Comments: 63 yo female has been nauseated since Tuesday with vertigo type symptoms. She has no previous Hx of vertigo. She has had more stress with work and is causing disruptive sleep. She has noticed increase fatigue. She tried dramamine/ Sudafed without relief. She notes BP has been 99-130/ 70-80.  She has been doing well on 1/2 of Phentermine RX and has continued to lose weight. She does not think the RX has contributed to her symptoms.     Dizziness   Current Outpatient Prescriptions on File Prior to Visit  Medication Sig Dispense Refill  . acidophilus (RISAQUAD) CAPS Take 1 capsule by mouth daily.      Marland Kitchen ALPRAZolam (XANAX) 1 MG tablet TAKE 1/2 TO 1 TABLET 3 TIMES A DAY AS NEEDED  90 tablet  0  . atenolol (TENORMIN) 100 MG tablet Take 50 mg by mouth daily.      Marland Kitchen b complex vitamins capsule Take 1 capsule by mouth daily.      . calcium carbonate (TUMS) 500 MG chewable tablet Chew 1,500 mg by mouth at bedtime.      . Cholecalciferol (VITAMIN D3) 5000 UNITS CAPS Take 5,000 Units by mouth daily.      . cyclobenzaprine (FLEXERIL) 10 MG tablet TAKE 1/2 TO 1 TABLET BY MOUTH 3 TIMES A DAY AS NEEDED FOR MUSCLE SPASM  30 tablet  0  . fenofibrate micronized (LOFIBRA) 134 MG capsule Take 134 mg by mouth daily before breakfast.      . Ferrous Sulfate (IRON) 325 (65 FE) MG TABS Take 325 mg by mouth daily.      . hydrochlorothiazide (HYDRODIURIL) 25 MG tablet Take 12.5 mg by mouth daily.      . hydrocortisone (ANUSOL-HC) 2.5 % rectal cream Place 1 application rectally 4 (four) times daily.  60 g  3  . Magnesium 250 MG TABS Take 250 mg by mouth 2 (two) times daily.      . metFORMIN (GLUCOPHAGE) 500 MG tablet Take 2 tablets (1,000 mg total) by mouth 2 (two) times daily with a meal.  360 tablet  0  . Multiple Vitamins-Minerals (MULTIVITAMIN WITH MINERALS) tablet Take 1 tablet by mouth 2 (two) times  daily.      . phentermine 37.5 MG capsule Take 37.5 mg by mouth every morning. Takes 1/2 in am      . verapamil (CALAN) 80 MG tablet Take 80 mg by mouth 2 (two) times daily.      Marland Kitchen acetaminophen (TYLENOL) 650 MG CR tablet Take 650 mg by mouth every 8 (eight) hours as needed for pain. Takes prn       No current facility-administered medications on file prior to visit.      Review of Systems  HENT: Positive for postnasal drip.   Neurological: Positive for dizziness.  All other systems reviewed and are negative.   BP 120/72  Pulse 98  Temp(Src) 98.2 F (36.8 C) (Temporal)  Resp 16  Ht 5' 3.5" (1.613 m)  Wt 166 lb (75.297 kg)  BMI 28.94 kg/m2      Objective:   Physical Exam  Nursing note and vitals reviewed. Constitutional: She is oriented to person, place, and time. She appears well-developed and well-nourished. No distress.  HENT:  Head: Normocephalic and atraumatic.  Right Ear: External ear normal.  Left Ear: External ear normal.  Nose: Nose normal.  Mouth/Throat: Oropharynx is clear and moist. No oropharyngeal exudate.  Cloudy TM's bilaterally   Eyes: Conjunctivae and EOM are normal.  Neck: Normal range of motion. Neck supple. No JVD present. No thyromegaly present.  Cardiovascular: Normal rate, regular rhythm, normal heart sounds and intact distal pulses.   Pulmonary/Chest: Effort normal and breath sounds normal.  Abdominal: Soft. Bowel sounds are normal. She exhibits no distension and no mass. There is no tenderness. There is no rebound and no guarding.  Musculoskeletal: Normal range of motion. She exhibits no edema and no tenderness.  Lymphadenopathy:    She has no cervical adenopathy.  Neurological: She is alert and oriented to person, place, and time. No cranial nerve deficit.  Mildly off balance with position change  Skin: Skin is warm and dry. No rash noted. No erythema. No pallor.  Psychiatric: She has a normal mood and affect. Her behavior is normal. Judgment  and thought content normal.          Assessment & Plan:  1. Vertigo, ? Sleep disturbance with fatigue vs Allergic rhinitis- Allegra OTC, increase H2o, allergy hygiene explained. Fatigue- check labs, increase activity and H2o. Vertigo exercises given. w/c if SX increase or ER. May get RX if desires for meclizine, she w/c. Monitor BP with fluctuations may also contribute to symptoms.  2. Obese- REFILL #2 or #3- Verify chart-Phentermine 37.5 continue 1/2 may decrease to QOD

## 2013-02-16 NOTE — Patient Instructions (Signed)
Vertigo  Vertigo means you feel like you are moving when you are not. Vertigo can make you feel like things around you are moving when they are not. This problem often goes away on its own.   HOME CARE   · Follow your doctor's instructions.  · Avoid driving.  · Avoid using heavy machinery.  · Avoid doing any activity that could be dangerous if you have a vertigo attack.  · Tell your doctor if a medicine seems to cause your vertigo.  GET HELP RIGHT AWAY IF:   · Your medicines do not help or make you feel worse.  · You have trouble talking or walking.  · You feel weak or have trouble using your arms, hands, or legs.  · You have bad headaches.  · You keep feeling sick to your stomach (nauseous) or throwing up (vomiting).  · Your vision changes.  · A family member notices changes in your behavior.  · Your problems get worse.  MAKE SURE YOU:  · Understand these instructions.  · Will watch your condition.  · Will get help right away if you are not doing well or get worse.  Document Released: 10/08/2007 Document Revised: 03/23/2011 Document Reviewed: 07/17/2010  [employer]® Patient Information ©2014 [employer].

## 2013-02-16 NOTE — Telephone Encounter (Signed)
Pt called and said she was in this morning. Requested refill for phentermine. Called pharmacy and Rx wasn't there, thought you may had called Rx into [facility] on [address], but gets that Rx at [facility] on [address].  Please advise.

## 2013-02-16 NOTE — Telephone Encounter (Signed)
Please call patient and tell her she should have a blue paper which is the RX. BUTmake sure it says tabs not capsules so she can continue to break in 1/2

## 2013-02-18 ENCOUNTER — Other Ambulatory Visit: Payer: Self-pay | Admitting: Internal Medicine

## 2013-02-24 ENCOUNTER — Other Ambulatory Visit: Payer: Self-pay | Admitting: Emergency Medicine

## 2013-02-24 MED ORDER — MECLIZINE HCL 25 MG PO TABS: 25.0000 mg | ORAL_TABLET | Freq: Three times a day (TID) | ORAL | Status: DC | PRN

## 2013-03-22 ENCOUNTER — Encounter: Payer: Self-pay | Admitting: Physician Assistant

## 2013-03-22 ENCOUNTER — Ambulatory Visit (INDEPENDENT_AMBULATORY_CARE_PROVIDER_SITE_OTHER): Payer: 59 | Admitting: Physician Assistant

## 2013-03-22 VITALS — BP 122/64 | HR 68 | Temp 98.1°F | Resp 16 | Ht 63.5 in | Wt 166.0 lb

## 2013-03-22 DIAGNOSIS — Z79899 Other long term (current) drug therapy: Secondary | ICD-10-CM

## 2013-03-22 DIAGNOSIS — E559 Vitamin D deficiency, unspecified: Secondary | ICD-10-CM

## 2013-03-22 DIAGNOSIS — E119 Type 2 diabetes mellitus without complications: Secondary | ICD-10-CM

## 2013-03-22 DIAGNOSIS — I10 Essential (primary) hypertension: Secondary | ICD-10-CM

## 2013-03-22 DIAGNOSIS — E782 Mixed hyperlipidemia: Secondary | ICD-10-CM

## 2013-03-22 LAB — CBC WITH DIFFERENTIAL/PLATELET
BASOS ABS: 0.1 10*3/uL (ref 0.0–0.1)
BASOS PCT: 1 % (ref 0–1)
EOS ABS: 0.1 10*3/uL (ref 0.0–0.7)
EOS PCT: 2 % (ref 0–5)
HCT: 38.9 % (ref 36.0–46.0)
Hemoglobin: 13.1 g/dL (ref 12.0–15.0)
Lymphocytes Relative: 42 % (ref 12–46)
Lymphs Abs: 3 10*3/uL (ref 0.7–4.0)
MCH: 29.8 pg (ref 26.0–34.0)
MCHC: 33.7 g/dL (ref 30.0–36.0)
MCV: 88.6 fL (ref 78.0–100.0)
Monocytes Absolute: 0.5 10*3/uL (ref 0.1–1.0)
Monocytes Relative: 7 % (ref 3–12)
Neutro Abs: 3.5 10*3/uL (ref 1.7–7.7)
Neutrophils Relative %: 48 % (ref 43–77)
PLATELETS: 500 10*3/uL — AB (ref 150–400)
RBC: 4.39 MIL/uL (ref 3.87–5.11)
RDW: 14 % (ref 11.5–15.5)
WBC: 7.2 10*3/uL (ref 4.0–10.5)

## 2013-03-22 LAB — HEMOGLOBIN A1C
Hgb A1c MFr Bld: 5.9 % — ABNORMAL HIGH (ref <5.7)
Mean Plasma Glucose: 123 mg/dL — ABNORMAL HIGH (ref <117)

## 2013-03-22 NOTE — Patient Instructions (Signed)
Vertigo Vertigo means you feel like you are moving when you are not. Vertigo can make you feel like things around you are moving when they are not. This problem often goes away on its own.  HOME CARE   Follow your doctor's instructions.  Avoid driving.  Avoid using heavy machinery.  Avoid doing any activity that could be dangerous if you have a vertigo attack.  Tell your doctor if a medicine seems to cause your vertigo. GET HELP RIGHT AWAY IF:   Your medicines do not help or make you feel worse.  You have trouble talking or walking.  You feel weak or have trouble using your arms, hands, or legs.  You have bad headaches.  You keep feeling sick to your stomach (nauseous) or throwing up (vomiting).  Your vision changes.  A family member notices changes in your behavior.  Your problems get worse. MAKE SURE YOU:  Understand these instructions.  Will watch your condition.  Will get help right away if you are not doing well or get worse. Document Released: 10/08/2007 Document Revised: 03/23/2011 Document Reviewed: 07/17/2010 [facility] Patient Information 2014 [place].  Benign Paroxysmal Positional Vertigo (BPPV)  General Information In Benign Paroxysmal Positional Vertigo (BPPV) dizziness is generally thought to be due to debris which has collected within a part of the inner ear. This debris can be thought of as "ear rocks", although the formal name is "otoconia". Ear rocks are small crystals of calcium carbonate derived from a structure in the ear called the "utricle" (figure to the right ). The symptoms of BPPV include dizziness or vertigo, lightheadedness, imbalance, and nausea. Activities which bring on symptoms will vary among persons, but symptoms are usually followed by a change of position of the head like getting out of bed or rolling over in bed are common "problem" motions .    What can be done? 1) Use two or more pillows at night. Avoid sleeping on the  "bad" side. In the morning, get up slowly and sit on the edge of the bed for a minute. 2) Medication prescribed by your doctor.  3) The exercises below, you can do at home to help you prevent the sensation later in the day.   Home treatments: The Brandt-Daroff Exercises are a home method of treating BPPV,and are effective 95% of the time.  These exercises are performed in three sets per day for two weeks. In each set, one performs the maneuver as shown five times. Start sitting upright (position 1). Then move into the side-lying position (position 2), with the head angled upward about halfway. An easy way to remember this is to imagine someone standing about 6 feet in front of you, and just keep looking at their head at all times. Stay in the side-lying position for 30 seconds, or until the dizziness subsides if this is longer, then go back to the sitting position (position 3). Stay there for 30 seconds, and then go to the opposite side (position 4) and follow the same routine.  At home Epley Maneuver This procedure seems to be even more effective than the in-office procedure, perhaps because it is repeated every night for a week.  The method (for the left side) is performed as shown on the figure below.  1) One stays in each of the supine (lying down) positions for 30 seconds, and in the sitting upright position (top) for 1 minute.  2) Thus, once cycle takes 2 1/2 minutes.  3) Typically 3 cycles are  performed just prior to going to sleep.  4) It is best to do them at night rather than in the morning or midday, as if one becomes dizzy following the exercises, then it can resolve while one is sleeping.  The mirror image of this procedure is used for the right ear.

## 2013-03-22 NOTE — Progress Notes (Signed)
HPI 63 y.o. female  presents for 3 month follow up with hypertension, hyperlipidemia, prediabetes and vitamin D. Her blood pressure has been controlled at home, today their BP is BP: 122/64 mmHg She does workout. She denies chest pain, shortness of breath, dizziness.  She is on cholesterol medication and denies myalgias. Her cholesterol is not at goal. The cholesterol last visit was:   Lab Results  Component Value Date   CHOL 229* 12/14/2012   HDL 80 12/14/2012   LDLCALC 128* 12/14/2012   TRIG 103 12/14/2012   CHOLHDL 2.9 12/14/2012   She has been working on diet and exercise for prediabetes, and denies foot ulcerations, nausea, paresthesia of the feet, polydipsia and polyuria. Last A1C in the office was:  Lab Results  Component Value Date   HGBA1C 6.0* 12/14/2012   Patient is on Vitamin D supplement.   One month ago she had vertigo, was given meclizine and exercise, it is better but worse in the morning rolling out of bed and worse with head back. She want to continues meclizine and if it is not better we will send her to PT. No dizziness with standing.  She states she is still on the phentermine 1/2 pill, she has not lost any weight from last visit.   Current Medications:  Current Outpatient Prescriptions on File Prior to Visit  Medication Sig Dispense Refill  . acetaminophen (TYLENOL) 650 MG CR tablet Take 650 mg by mouth every 8 (eight) hours as needed for pain. Takes prn      . acidophilus (RISAQUAD) CAPS Take 1 capsule by mouth daily.      Marland Kitchen ALPRAZolam (XANAX) 1 MG tablet TAKE 1/2 TO 1 TABLET BY MOUTH 3 TIMES DAILY  90 tablet  1  . atenolol (TENORMIN) 100 MG tablet Take 50 mg by mouth daily.      Marland Kitchen b complex vitamins capsule Take 1 capsule by mouth daily.      . calcium carbonate (TUMS) 500 MG chewable tablet Chew 1,500 mg by mouth at bedtime.      . Cholecalciferol (VITAMIN D3) 5000 UNITS CAPS Take 5,000 Units by mouth daily.      . cyclobenzaprine (FLEXERIL) 10 MG tablet TAKE 1/2 TO  1 TABLET BY MOUTH 3 TIMES A DAY AS NEEDED FOR MUSCLE SPASM  30 tablet  0  . fenofibrate micronized (LOFIBRA) 134 MG capsule Take 134 mg by mouth daily before breakfast.      . Ferrous Sulfate (IRON) 325 (65 FE) MG TABS Take 325 mg by mouth daily.      . hydrochlorothiazide (HYDRODIURIL) 25 MG tablet TAKE 1 TABLET BY MOUTH ONCE EVERY MORNING FOR BLOOD PRESSURE AND FLUID  90 tablet  1  . hydrocortisone (ANUSOL-HC) 2.5 % rectal cream Place 1 application rectally 4 (four) times daily.  60 g  3  . Magnesium 250 MG TABS Take 250 mg by mouth 2 (two) times daily.      . meclizine (ANTIVERT) 25 MG tablet Take 1 tablet (25 mg total) by mouth 3 (three) times daily as needed for dizziness or nausea.  60 tablet  1  . metFORMIN (GLUCOPHAGE) 500 MG tablet Take 2 tablets (1,000 mg total) by mouth 2 (two) times daily with a meal.  360 tablet  0  . Multiple Vitamins-Minerals (MULTIVITAMIN WITH MINERALS) tablet Take 1 tablet by mouth 2 (two) times daily.      . phentermine 37.5 MG capsule Take 1 capsule (37.5 mg total) by mouth every morning. Takes  1/2 in am  30 capsule  0  . verapamil (CALAN) 80 MG tablet Take 80 mg by mouth 2 (two) times daily.       No current facility-administered medications on file prior to visit.   Medical History:  Past Medical History  Diagnosis Date  . Hypertension   . GERD (gastroesophageal reflux disease)   . H/O hiatal hernia   . 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 At risk for difficult insertion of breathing tube   . Type II or unspecified type diabetes mellitus without mention of complication, not stated as uncontrolled 12/14/2012   Allergies:  Allergies  Allergen Reactions  . Codeine Itching  . Lipitor [Atorvastatin] Other (See Comments)    Change in memory.  . Ace Inhibitors Rash     Review of Systems: [X]  = complains of  [ ]  = denies  General: Fatigue [ ]  Fever [ ]  Chills [ ]  Weakness [ ]   Insomnia [ ]  Eyes: Redness [ ]  Blurred vision [ ]  Diplopia [ ]    ENT: Congestion [ ]  Sinus Pain [ ]  Post Nasal Drip [ ]  Sore Throat [ ]  Earache [ ]   Cardiac: Chest pain/pressure [ ]  SOB [ ]  Orthopnea [ ]   Palpitations [ ]   Paroxysmal nocturnal dyspnea[ ]  Claudication [ ]  Edema [ ]   Pulmonary: Cough [ ]  Wheezing[ ]   SOB [ ]   Snoring [ ]   GI: Nausea [ ]  Vomiting[ ]  Dysphagia[ ]  Heartburn[ ]  Abdominal pain [ ]  Constipation [ ] ; Diarrhea [ ] ; BRBPR [ ]  Melena[ ]  GU: Hematuria[ ]  Dysuria [ ]  Nocturia[ ]  Urgency [ ]   Hesitancy [ ]  Discharge [ ]  Neuro: Headaches[ ]  Vertigo[X ] Paresthesias[ ]  Spasm [ ]  Speech changes [ ]  Incoordination [ ]   Ortho: Arthritis [ ]  Joint pain [ ]  Muscle pain [ ]  Joint swelling [ ]  Back Pain [ ]  Skin:  Rash [ ]   Pruritis [ ]  Change in skin lesion [ ]   Psych: Depression[ ]  Anxiety[ ]  Confusion [ ]  Memory loss [ ]   Heme/Lypmh: Bleeding [ ]  Bruising [ ]  Enlarged lymph nodes [ ]   Endocrine: Visual blurring [ ]  Paresthesia [ ]  Polyuria [ ]  Polydypsea [ ]    Heat/cold intolerance [ ]  Hypoglycemia [ ]   Family history- Review and unchanged Social history- Review and unchanged Physical Exam: Filed Vitals:   03/22/13 1607  BP: 122/64  Pulse: 68  Temp: 98.1 F (36.7 C)  Resp: 16   Wt Readings from Last 3 Encounters:  03/22/13 166 lb (75.297 kg)  02/16/13 166 lb (75.297 kg)  12/14/12 170 lb 9.6 oz (77.384 kg)   General Appearance: Well nourished, in no apparent distress. Eyes: PERRLA, EOMs, conjunctiva no swelling or erythema Sinuses: No Frontal/maxillary tenderness ENT/Mouth: Ext aud canals clear, TMs without erythema, bulging. No erythema, swelling, or exudate on post pharynx.  Tonsils not swollen or erythematous. Hearing normal.  Neck: Supple, thyroid normal.  Respiratory: Respiratory effort normal, BS equal bilaterally without rales, rhonchi, wheezing or stridor.  Cardio: RRR with no MRGs. Brisk peripheral pulses without edema.  Abdomen: Soft, + BS.  Non tender, no guarding, rebound, hernias, masses. Lymphatics: Non tender  without lymphadenopathy.  Musculoskeletal: Full ROM, 5/5 strength, normal gait.  Skin: Warm, dry without rashes, lesions, ecchymosis.  Neuro: Cranial nerves intact. Normal muscle tone, no cerebellar symptoms. Sensation intact.  Psych: Awake and oriented X 3, normal affect, Insight and Judgment appropriate.   Assessment and Plan:  Hypertension: Continue medication, monitor  blood pressure at home.  Continue DASH diet. Cholesterol: Continue diet and exercise. Check cholesterol.  Pre-diabetes-Continue diet and exercise. Check A1C Vitamin D Def- check level and continue medications.  Vertigo- normal neuro exam- continue meclizine, if worse go to Er, if not better in one month will refer to [facility] PT for therapy  Continue diet and meds as discussed. Further disposition pending results of labs.  Vicie Mutters 4:34 PM

## 2013-03-23 LAB — BASIC METABOLIC PANEL WITH GFR
BUN: 15 mg/dL (ref 6–23)
CALCIUM: 9.6 mg/dL (ref 8.4–10.5)
CO2: 30 mEq/L (ref 19–32)
CREATININE: 0.84 mg/dL (ref 0.50–1.10)
Chloride: 100 mEq/L (ref 96–112)
GFR, EST AFRICAN AMERICAN: 87 mL/min
GFR, EST NON AFRICAN AMERICAN: 75 mL/min
Glucose, Bld: 92 mg/dL (ref 70–99)
Potassium: 3.8 mEq/L (ref 3.5–5.3)
Sodium: 138 mEq/L (ref 135–145)

## 2013-03-23 LAB — LIPID PANEL
CHOL/HDL RATIO: 2.7 ratio
CHOLESTEROL: 213 mg/dL — AB (ref 0–200)
HDL: 78 mg/dL (ref >39)
LDL Cholesterol: 121 mg/dL — ABNORMAL HIGH (ref 0–99)
TRIGLYCERIDES: 70 mg/dL (ref <150)
VLDL: 14 mg/dL (ref 0–40)

## 2013-03-23 LAB — VITAMIN D 25 HYDROXY (VIT D DEFICIENCY, FRACTURES): VIT D 25 HYDROXY: 95 ng/mL — AB (ref 30–89)

## 2013-03-23 LAB — INSULIN, FASTING: Insulin fasting, serum: 10 u[IU]/mL (ref 3–28)

## 2013-03-23 LAB — HEPATIC FUNCTION PANEL
ALBUMIN: 4.3 g/dL (ref 3.5–5.2)
ALT: 18 U/L (ref 0–35)
AST: 16 U/L (ref 0–37)
Alkaline Phosphatase: 36 U/L — ABNORMAL LOW (ref 39–117)
BILIRUBIN TOTAL: 0.3 mg/dL (ref 0.2–1.2)
Bilirubin, Direct: 0.1 mg/dL (ref 0.0–0.3)
Indirect Bilirubin: 0.2 mg/dL (ref 0.2–1.2)
Total Protein: 6.5 g/dL (ref 6.0–8.3)

## 2013-03-23 LAB — MAGNESIUM: MAGNESIUM: 1.9 mg/dL (ref 1.5–2.5)

## 2013-03-23 LAB — TSH: TSH: 3.408 u[IU]/mL (ref 0.350–4.500)

## 2013-04-07 ENCOUNTER — Other Ambulatory Visit: Payer: Self-pay | Admitting: Emergency Medicine

## 2013-04-07 ENCOUNTER — Other Ambulatory Visit: Payer: Self-pay | Admitting: Physician Assistant

## 2013-04-14 ENCOUNTER — Other Ambulatory Visit: Payer: Self-pay | Admitting: *Deleted

## 2013-04-14 ENCOUNTER — Telehealth: Payer: Self-pay | Admitting: Internal Medicine

## 2013-04-14 ENCOUNTER — Other Ambulatory Visit: Payer: Self-pay | Admitting: Emergency Medicine

## 2013-04-14 MED ORDER — PHENTERMINE HCL 37.5 MG PO TABS: 37.5000 mg | ORAL_TABLET | Freq: Every day | ORAL | Status: DC

## 2013-04-14 NOTE — Telephone Encounter (Signed)
PATIENT CALLED AND SAID [facility] AT [address], SAID RX FOR phentermine 37.5 MG capsule, WAS IN CAPSULE FORM INSTEAD OF TABLET, PT SAID SHE BREAKS IN HALF, NEEDS TABLET, ALSO RX READS 1/2 DAILY AND 1 CAPSULE DAILY. PLEASE ADVISE PHARM AND PT .  THANKS, KATRINA

## 2013-05-07 ENCOUNTER — Other Ambulatory Visit: Payer: Self-pay | Admitting: Emergency Medicine

## 2013-05-07 ENCOUNTER — Other Ambulatory Visit: Payer: Self-pay | Admitting: Physician Assistant

## 2013-05-08 ENCOUNTER — Other Ambulatory Visit: Payer: Self-pay | Admitting: Emergency Medicine

## 2013-05-30 ENCOUNTER — Encounter: Payer: Self-pay | Admitting: Physician Assistant

## 2013-05-30 ENCOUNTER — Ambulatory Visit (INDEPENDENT_AMBULATORY_CARE_PROVIDER_SITE_OTHER): Payer: 59 | Admitting: Physician Assistant

## 2013-05-30 ENCOUNTER — Other Ambulatory Visit: Payer: Self-pay | Admitting: Physician Assistant

## 2013-05-30 VITALS — BP 122/70 | HR 64 | Resp 16 | Wt 171.0 lb

## 2013-05-30 DIAGNOSIS — R110 Nausea: Secondary | ICD-10-CM

## 2013-05-30 DIAGNOSIS — M545 Low back pain, unspecified: Secondary | ICD-10-CM

## 2013-05-30 DIAGNOSIS — N3000 Acute cystitis without hematuria: Secondary | ICD-10-CM

## 2013-05-30 LAB — HEPATIC FUNCTION PANEL
ALBUMIN: 4.5 g/dL (ref 3.5–5.2)
ALT: 17 U/L (ref 0–35)
AST: 19 U/L (ref 0–37)
Alkaline Phosphatase: 34 U/L — ABNORMAL LOW (ref 39–117)
Bilirubin, Direct: 0.1 mg/dL (ref 0.0–0.3)
Indirect Bilirubin: 0.2 mg/dL (ref 0.2–1.2)
TOTAL PROTEIN: 6.6 g/dL (ref 6.0–8.3)
Total Bilirubin: 0.3 mg/dL (ref 0.2–1.2)

## 2013-05-30 LAB — BASIC METABOLIC PANEL WITH GFR
BUN: 16 mg/dL (ref 6–23)
CALCIUM: 10 mg/dL (ref 8.4–10.5)
CO2: 30 mEq/L (ref 19–32)
CREATININE: 0.88 mg/dL (ref 0.50–1.10)
Chloride: 101 mEq/L (ref 96–112)
GFR, EST AFRICAN AMERICAN: 82 mL/min
GFR, EST NON AFRICAN AMERICAN: 71 mL/min
Glucose, Bld: 98 mg/dL (ref 70–99)
Potassium: 4.2 mEq/L (ref 3.5–5.3)
Sodium: 139 mEq/L (ref 135–145)

## 2013-05-30 LAB — CBC WITH DIFFERENTIAL/PLATELET
BASOS ABS: 0.1 10*3/uL (ref 0.0–0.1)
Basophils Relative: 1 % (ref 0–1)
EOS ABS: 0.1 10*3/uL (ref 0.0–0.7)
EOS PCT: 2 % (ref 0–5)
HCT: 38.8 % (ref 36.0–46.0)
Hemoglobin: 13.5 g/dL (ref 12.0–15.0)
Lymphocytes Relative: 42 % (ref 12–46)
Lymphs Abs: 2.2 10*3/uL (ref 0.7–4.0)
MCH: 30.3 pg (ref 26.0–34.0)
MCHC: 34.8 g/dL (ref 30.0–36.0)
MCV: 87.2 fL (ref 78.0–100.0)
MONO ABS: 0.4 10*3/uL (ref 0.1–1.0)
Monocytes Relative: 7 % (ref 3–12)
NEUTROS ABS: 2.5 10*3/uL (ref 1.7–7.7)
Neutrophils Relative %: 48 % (ref 43–77)
Platelets: 448 10*3/uL — ABNORMAL HIGH (ref 150–400)
RBC: 4.45 MIL/uL (ref 3.87–5.11)
RDW: 13.2 % (ref 11.5–15.5)
WBC: 5.2 10*3/uL (ref 4.0–10.5)

## 2013-05-30 LAB — URINALYSIS, ROUTINE W REFLEX MICROSCOPIC
BILIRUBIN URINE: NEGATIVE
GLUCOSE, UA: NEGATIVE mg/dL
Hgb urine dipstick: NEGATIVE
KETONES UR: NEGATIVE mg/dL
Leukocytes, UA: NEGATIVE
Nitrite: NEGATIVE
PROTEIN: NEGATIVE mg/dL
Specific Gravity, Urine: 1.008 (ref 1.005–1.030)
UROBILINOGEN UA: 0.2 mg/dL (ref 0.0–1.0)
pH: 7.5 (ref 5.0–8.0)

## 2013-05-30 NOTE — Patient Instructions (Signed)
Back Pain, Adult Low back pain is very common. About 1 in 5 people have back pain.The cause of low back pain is rarely dangerous. The pain often gets better over time.About half of people with a sudden onset of back pain feel better in just 2 weeks. About 8 in 10 people feel better by 6 weeks.  CAUSES Some common causes of back pain include:  Strain of the muscles or ligaments supporting the spine.  Wear and tear (degeneration) of the spinal discs.  Arthritis.  Direct injury to the back. DIAGNOSIS Most of the time, the direct cause of low back pain is not known.However, back pain can be treated effectively even when the exact cause of the pain is unknown.Answering your caregiver's questions about your overall health and symptoms is one of the most accurate ways to make sure the cause of your pain is not dangerous. If your caregiver needs more information, he or she may order lab work or imaging tests (X-rays or MRIs).However, even if imaging tests show changes in your back, this usually does not require surgery. HOME CARE INSTRUCTIONS For many people, back pain returns.Since low back pain is rarely dangerous, it is often a condition that people can learn to manageon their own.   Remain active. It is stressful on the back to sit or stand in one place. Do not sit, drive, or stand in one place for more than 30 minutes at a time. Take short walks on level surfaces as soon as pain allows.Try to increase the length of time you walk each day.  Do not stay in bed.Resting more than 1 or 2 days can delay your recovery.  Do not avoid exercise or work.Your body is made to move.It is not dangerous to be active, even though your back may hurt.Your back will likely heal faster if you return to being active before your pain is gone.  Pay attention to your body when you bend and lift. Many people have less discomfortwhen lifting if they bend their knees, keep the load close to their bodies,and  avoid twisting. Often, the most comfortable positions are those that put less stress on your recovering back.  Find a comfortable position to sleep. Use a firm mattress and lie on your side with your knees slightly bent. If you lie on your back, put a pillow under your knees.  Only take over-the-counter or prescription medicines as directed by your caregiver. Over-the-counter medicines to reduce pain and inflammation are often the most helpful.Your caregiver may prescribe muscle relaxant drugs.These medicines help dull your pain so you can more quickly return to your normal activities and healthy exercise.  Put ice on the injured area.  Put ice in a plastic bag.  Place a towel between your skin and the bag.  Leave the ice on for 15-20 minutes, 03-04 times a day for the first 2 to 3 days. After that, ice and heat may be alternated to reduce pain and spasms.  Ask your caregiver about trying back exercises and gentle massage. This may be of some benefit.  Avoid feeling anxious or stressed.Stress increases muscle tension and can worsen back pain.It is important to recognize when you are anxious or stressed and learn ways to manage it.Exercise is a great option. SEEK MEDICAL CARE IF:  You have pain that is not relieved with rest or medicine.  You have pain that does not improve in 1 week.  You have new symptoms.  You are generally not feeling well. SEEK   IMMEDIATE MEDICAL CARE IF:   You have pain that radiates from your back into your legs.  You develop new bowel or bladder control problems.  You have unusual weakness or numbness in your arms or legs.  You develop nausea or vomiting.  You develop abdominal pain.  You feel faint. Document Released: 12/29/2004 Document Revised: 06/30/2011 Document Reviewed: 05/19/2010 [employer] Patient Information 2014 [employer].  

## 2013-05-30 NOTE — Progress Notes (Signed)
   Subjective:    Patient ID: Taylor Oconnor, female    DOB: [birth year], 63 y.o.   MRN: [id number]  Back Pain This is a new problem. Episode onset: 1 month intermittent but worse yesterday. The problem occurs intermittently. The problem has been gradually worsening since onset. The pain is present in the sacro-iliac and lumbar spine (right). Quality: aching but sharp at times. The pain does not radiate. Exacerbated by: reaching. Associated symptoms include abdominal pain and dysuria (very rarely). Pertinent negatives include no fever. Risk factors include obesity and menopause.     Review of Systems  Constitutional: Negative.  Negative for fever.  HENT: Negative.   Respiratory: Negative.   Cardiovascular: Negative.   Gastrointestinal: Positive for nausea, abdominal pain and diarrhea. Negative for vomiting, constipation, blood in stool, abdominal distention, anal bleeding and rectal pain.  Genitourinary: Positive for dysuria (very rarely).  Musculoskeletal: Positive for back pain.       Objective:   Physical Exam  Vitals reviewed. Constitutional: She is oriented to person, place, and time. She appears well-developed and well-nourished.  HENT:  Head: Normocephalic and atraumatic.  Cardiovascular: Normal rate and regular rhythm.   Pulmonary/Chest: Effort normal and breath sounds normal.  Abdominal: Soft. Bowel sounds are normal. She exhibits no distension and no mass. There is tenderness (mild tenderness RUQ). There is no rebound and no guarding.  Musculoskeletal: Normal range of motion. She exhibits tenderness (Right SI). She exhibits no edema.  Neurological: She is alert and oriented to person, place, and time. She exhibits normal muscle tone.  Skin: Skin is warm and dry.      Assessment & Plan:  Lower back pain-  ? From SI/mechanical- given NSAID, can do flexeril ? 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

## 2013-05-31 LAB — URINE CULTURE: Colony Count: 10000

## 2013-07-03 ENCOUNTER — Ambulatory Visit: Payer: Self-pay | Admitting: Internal Medicine

## 2013-07-04 ENCOUNTER — Encounter: Payer: Self-pay | Admitting: Internal Medicine

## 2013-07-04 ENCOUNTER — Ambulatory Visit (INDEPENDENT_AMBULATORY_CARE_PROVIDER_SITE_OTHER): Payer: 59 | Admitting: Internal Medicine

## 2013-07-04 VITALS — BP 126/82 | HR 80 | Temp 98.1°F | Resp 18 | Ht 63.5 in | Wt 175.0 lb

## 2013-07-04 DIAGNOSIS — E782 Mixed hyperlipidemia: Secondary | ICD-10-CM

## 2013-07-04 DIAGNOSIS — Z79899 Other long term (current) drug therapy: Secondary | ICD-10-CM

## 2013-07-04 DIAGNOSIS — I10 Essential (primary) hypertension: Secondary | ICD-10-CM

## 2013-07-04 DIAGNOSIS — E559 Vitamin D deficiency, unspecified: Secondary | ICD-10-CM

## 2013-07-04 DIAGNOSIS — E1129 Type 2 diabetes mellitus with other diabetic kidney complication: Secondary | ICD-10-CM

## 2013-07-04 NOTE — Progress Notes (Signed)
Patient ID: Taylor Oconnor, female   DOB: 1950-06-01, 63 y.o.   MRN: [id number]   This very nice 63 y.o.MWF presents for 3 month follow up with Hypertension, Hyperlipidemia, Pre-Diabetes and Vitamin D Deficiency.    HTN predates since 1995. BP has been controlled at home. Today's BP: 126/82 mmHg. Patient denies any cardiac type chest pain, palpitations, dyspnea/orthopnea/PND, dizziness, claudication, or dependent edema.   Hyperlipidemia is notcontrolled with diet & meds.  Patient denies myalgias or other med SE's.Last Lipids as below were not at goal.  Lab Results  Component Value Date   CHOL 213* 03/22/2013   HDL 78 03/22/2013   LDLCALC 121* 03/22/2013   TRIG 70 03/22/2013   CHOLHDL 2.7 03/22/2013    Also, the patient has history Obesity (BMI 31) and consequent T2_NIDDM since 2008  and with Stage 2 CKD (GFR 71 ml/min)  and last A1c was 5.9% in Mar 2015.  Patient denies any symptoms of reactive hypoglycemia, diabetic polys, paresthesias or visual blurring.   Further, Patient has history of Vitamin D Deficiency of 28 in 2008 and last vitamin D was 95 in Mar 2015. Patient supplements vitamin D without any suspected side-effects.    Medication List   acetaminophen 650 MG CR tablet  Commonly known as:  TYLENOL  Take 650 mg by mouth every 8 (eight) hours as needed for pain. Takes prn     acidophilus Caps capsule  Take 1 capsule by mouth daily.     ALPRAZolam 1 MG tablet  Commonly known as:  XANAX  TAKE 1/2 TO 1 TABLET 3 TIMES A DAY     aspirin 81 MG tablet  Take 81 mg by mouth daily.     atenolol 100 MG tablet  Commonly known as:  TENORMIN  Take 50 mg by mouth daily.     b complex vitamins capsule  Take 1 capsule by mouth daily.     cyclobenzaprine 10 MG tablet  Commonly known as:  FLEXERIL  TAKE 1/2 TO 1 TABLET BY MOUTH 3 TIMES A DAY AS NEEDED FOR MUSCLE SPASM     fenofibrate micronized 134 MG capsule  Commonly known as:  LOFIBRA  TAKE ONE CAPSULE BY MOUTH DAILY     hydrochlorothiazide 25 MG tablet  Commonly known as:  HYDRODIURIL  TAKE 1 TABLET BY MOUTH ONCE EVERY MORNING FOR BLOOD PRESSURE AND FLUID     hydrocortisone 2.5 % rectal cream  Commonly known as:  ANUSOL-HC  Place 1 application rectally 4 (four) times daily.     Iron 325 (65 FE) MG Tabs  Take 325 mg by mouth daily.     Magnesium 250 MG Tabs  Take 250 mg by mouth 2 (two) times daily.     metFORMIN 500 MG tablet  Commonly known as:  GLUCOPHAGE  TAKE 2 TABLETS (1,000 MG TOTAL) BY MOUTH 2 (TWO) TIMES DAILY WITH A MEAL.     multivitamin with minerals tablet  Take 1 tablet by mouth 2 (two) times daily.     TUMS 500 MG chewable tablet  Generic drug:  calcium carbonate  Chew 1,500 mg by mouth at bedtime.     verapamil 80 MG tablet  Commonly known as:  CALAN  Take 80 mg by mouth 2 (two) times daily.     Vitamin D3 5000 UNITS Caps  Take 5,000 Units by mouth daily.       Allergies  Allergen Reactions  . Codeine Itching  . Lipitor [Atorvastatin] Other (See Comments)  Change in memory.  Humberto Leep Inhibitors Rash   PMHx:   Past Medical History  Diagnosis Date  . Hypertension   . GERD (gastroesophageal reflux disease)   . H/O hiatal hernia   . 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 At risk for difficult insertion of breathing tube   . Type II or unspecified type diabetes mellitus without mention of complication, not stated as uncontrolled 12/14/2012   FHx:    Reviewed / unchanged  SHx:    Reviewed / unchanged   Systems Review:  Constitutional: Denies fever, chills, wt changes, headaches, insomnia, fatigue, night sweats, change in appetite. Eyes: Denies redness, blurred vision, diplopia, discharge, itchy, watery eyes.  ENT: Denies discharge, congestion, post nasal drip, epistaxis, sore throat, earache, hearing loss, dental pain, tinnitus, vertigo, sinus pain, snoring.  CV: Denies chest pain, palpitations, irregular heartbeat, syncope, dyspnea, diaphoresis, orthopnea,  PND, claudication or edema. Respiratory: denies cough, dyspnea, DOE, pleurisy, hoarseness, laryngitis, wheezing.  Gastrointestinal: Denies dysphagia, odynophagia, heartburn, reflux, water brash, abdominal pain or cramps, nausea, vomiting, bloating, diarrhea, constipation, hematemesis, melena, hematochezia  or hemorrhoids. Genitourinary: Denies dysuria, frequency, urgency, nocturia, hesitancy, discharge, hematuria or flank pain. Musculoskeletal: Denies arthralgias, myalgias, stiffness, jt. swelling, pain, limping or strain/sprain.  Skin: Denies pruritus, rash, hives, warts, acne, eczema or change in skin lesion(s). Neuro: No weakness, tremor, incoordination, spasms, paresthesia or pain. Psychiatric: Denies confusion, memory loss or sensory loss. Endo: Denies change in weight, skin or hair change.  Heme/Lymph: No excessive bleeding, bruising or enlarged lymph nodes.  Exam:  BP 126/82  Pulse 80  Temp 98.1 F   Resp 18  Ht 5' 3.5"   Wt 175 lb   BMI 30.51 kg/m2  Appears well nourished - in no distress. Eyes: PERRLA, EOMs, conjunctiva no swelling or erythema. Sinuses: No frontal/maxillary tenderness ENT/Mouth: EAC's clear, TM's nl w/o erythema, bulging. Nares clear w/o erythema, swelling, exudates. Oropharynx clear without erythema or exudates. Oral hygiene is good. Tongue normal, non obstructing. Hearing intact.  Neck: Supple. Thyroid nl. Car 2+/2+ without bruits, nodes or JVD. Chest: Respirations nl with BS clear & equal w/o rales, rhonchi, wheezing or stridor.  Cor: Heart sounds normal w/ regular rate and rhythm without sig. murmurs, gallops, clicks, or rubs. Peripheral pulses normal and equal  without edema.  Abdomen: Soft & bowel sounds normal. Non-tender w/o guarding, rebound, hernias, masses, or organomegaly.  Lymphatics: Unremarkable.  Musculoskeletal: Full ROM all peripheral extremities, joint stability, 5/5 strength, and normal gait.  Skin: Warm, dry without exposed rashes, lesions  or ecchymosis apparent.  Neuro: Cranial nerves intact, reflexes equal bilaterally. Sensory-motor testing grossly intact. Tendon reflexes grossly intact.  Pysch: Alert & oriented x 3. Insight and judgement nl & appropriate. No ideations.   Assessment and Plan:  1. Hypertension - Continue monitor blood pressure at home. Continue diet/meds same.  2. Hyperlipidemia - Continue diet/meds, exercise,& lifestyle modifications. Continue monitor periodic cholesterol/liver & renal functions   3. T2_NIDDM w/Stage 2 CKD (GFR 71 ml/min) - continue recommend prudent low glycemic diet, weight control, regular exercise, diabetic monitoring and periodic eye exams.  4. Vitamin D Deficiency - Continue supplementation.  Recommended regular exercise, BP monitoring, weight control, and discussed med and SE's. Recommended labs to assess and monitor clinical status. Further disposition pending results of labs.

## 2013-07-04 NOTE — Patient Instructions (Signed)

## 2013-07-05 LAB — LIPID PANEL
Cholesterol: 202 mg/dL — ABNORMAL HIGH (ref 0–200)
HDL: 87 mg/dL (ref >39)
LDL Cholesterol: 103 mg/dL — ABNORMAL HIGH (ref 0–99)
TRIGLYCERIDES: 59 mg/dL (ref <150)
Total CHOL/HDL Ratio: 2.3 Ratio
VLDL: 12 mg/dL (ref 0–40)

## 2013-07-05 LAB — BASIC METABOLIC PANEL WITH GFR
BUN: 19 mg/dL (ref 6–23)
CO2: 27 mEq/L (ref 19–32)
Calcium: 9.8 mg/dL (ref 8.4–10.5)
Chloride: 102 mEq/L (ref 96–112)
Creat: 0.8 mg/dL (ref 0.50–1.10)
GFR, EST NON AFRICAN AMERICAN: 80 mL/min
Glucose, Bld: 88 mg/dL (ref 70–99)
Potassium: 4.2 mEq/L (ref 3.5–5.3)
Sodium: 140 mEq/L (ref 135–145)

## 2013-07-05 LAB — VITAMIN D 25 HYDROXY (VIT D DEFICIENCY, FRACTURES): VIT D 25 HYDROXY: 88 ng/mL (ref 30–89)

## 2013-07-05 LAB — MAGNESIUM: MAGNESIUM: 1.9 mg/dL (ref 1.5–2.5)

## 2013-07-05 LAB — CBC WITH DIFFERENTIAL/PLATELET
BASOS PCT: 1 % (ref 0–1)
Basophils Absolute: 0.1 10*3/uL (ref 0.0–0.1)
EOS PCT: 2 % (ref 0–5)
Eosinophils Absolute: 0.1 10*3/uL (ref 0.0–0.7)
HEMATOCRIT: 38.5 % (ref 36.0–46.0)
Hemoglobin: 13.1 g/dL (ref 12.0–15.0)
Lymphocytes Relative: 40 % (ref 12–46)
Lymphs Abs: 2.9 10*3/uL (ref 0.7–4.0)
MCH: 29.8 pg (ref 26.0–34.0)
MCHC: 34 g/dL (ref 30.0–36.0)
MCV: 87.7 fL (ref 78.0–100.0)
MONO ABS: 0.5 10*3/uL (ref 0.1–1.0)
Monocytes Relative: 7 % (ref 3–12)
NEUTROS ABS: 3.7 10*3/uL (ref 1.7–7.7)
Neutrophils Relative %: 50 % (ref 43–77)
Platelets: 453 10*3/uL — ABNORMAL HIGH (ref 150–400)
RBC: 4.39 MIL/uL (ref 3.87–5.11)
RDW: 14.2 % (ref 11.5–15.5)
WBC: 7.3 10*3/uL (ref 4.0–10.5)

## 2013-07-05 LAB — HEPATIC FUNCTION PANEL
ALBUMIN: 4.2 g/dL (ref 3.5–5.2)
ALT: 16 U/L (ref 0–35)
AST: 17 U/L (ref 0–37)
Alkaline Phosphatase: 32 U/L — ABNORMAL LOW (ref 39–117)
Bilirubin, Direct: 0.1 mg/dL (ref 0.0–0.3)
Indirect Bilirubin: 0.2 mg/dL (ref 0.2–1.2)
TOTAL PROTEIN: 6.6 g/dL (ref 6.0–8.3)
Total Bilirubin: 0.3 mg/dL (ref 0.2–1.2)

## 2013-07-05 LAB — TSH: TSH: 2.413 u[IU]/mL (ref 0.350–4.500)

## 2013-07-05 LAB — INSULIN, FASTING: INSULIN FASTING, SERUM: 17 u[IU]/mL (ref 3–28)

## 2013-07-05 LAB — HEMOGLOBIN A1C
Hgb A1c MFr Bld: 6 % — ABNORMAL HIGH (ref <5.7)
MEAN PLASMA GLUCOSE: 126 mg/dL — AB (ref <117)

## 2013-07-07 ENCOUNTER — Other Ambulatory Visit: Payer: Self-pay | Admitting: Internal Medicine

## 2013-08-01 ENCOUNTER — Other Ambulatory Visit: Payer: Self-pay | Admitting: Emergency Medicine

## 2013-08-14 ENCOUNTER — Encounter: Payer: Self-pay | Admitting: Gastroenterology

## 2013-08-17 ENCOUNTER — Other Ambulatory Visit: Payer: Self-pay | Admitting: Internal Medicine

## 2013-10-05 ENCOUNTER — Other Ambulatory Visit: Payer: Self-pay

## 2013-10-05 DIAGNOSIS — Z1231 Encounter for screening mammogram for malignant neoplasm of breast: Secondary | ICD-10-CM

## 2013-10-09 ENCOUNTER — Ambulatory Visit (INDEPENDENT_AMBULATORY_CARE_PROVIDER_SITE_OTHER): Payer: 59 | Admitting: Physician Assistant

## 2013-10-09 ENCOUNTER — Encounter: Payer: Self-pay | Admitting: Physician Assistant

## 2013-10-09 VITALS — BP 122/68 | HR 76 | Temp 98.1°F | Resp 16 | Ht 63.5 in | Wt 182.0 lb

## 2013-10-09 DIAGNOSIS — Z79899 Other long term (current) drug therapy: Secondary | ICD-10-CM

## 2013-10-09 DIAGNOSIS — Z23 Encounter for immunization: Secondary | ICD-10-CM

## 2013-10-09 DIAGNOSIS — E8881 Metabolic syndrome: Secondary | ICD-10-CM

## 2013-10-09 DIAGNOSIS — E559 Vitamin D deficiency, unspecified: Secondary | ICD-10-CM

## 2013-10-09 DIAGNOSIS — E1129 Type 2 diabetes mellitus with other diabetic kidney complication: Secondary | ICD-10-CM

## 2013-10-09 DIAGNOSIS — E782 Mixed hyperlipidemia: Secondary | ICD-10-CM

## 2013-10-09 DIAGNOSIS — I10 Essential (primary) hypertension: Secondary | ICD-10-CM

## 2013-10-09 LAB — CBC WITH DIFFERENTIAL/PLATELET
Basophils Absolute: 0.1 K/uL (ref 0.0–0.1)
Basophils Relative: 1 % (ref 0–1)
Eosinophils Absolute: 0.1 K/uL (ref 0.0–0.7)
Eosinophils Relative: 2 % (ref 0–5)
HCT: 39.4 % (ref 36.0–46.0)
Hemoglobin: 13.5 g/dL (ref 12.0–15.0)
Lymphocytes Relative: 45 % (ref 12–46)
Lymphs Abs: 3.1 K/uL (ref 0.7–4.0)
MCH: 30.2 pg (ref 26.0–34.0)
MCHC: 34.3 g/dL (ref 30.0–36.0)
MCV: 88.1 fL (ref 78.0–100.0)
Monocytes Absolute: 0.4 K/uL (ref 0.1–1.0)
Monocytes Relative: 6 % (ref 3–12)
Neutro Abs: 3.1 K/uL (ref 1.7–7.7)
Neutrophils Relative %: 46 % (ref 43–77)
Platelets: 423 K/uL — ABNORMAL HIGH (ref 150–400)
RBC: 4.47 MIL/uL (ref 3.87–5.11)
RDW: 13.6 % (ref 11.5–15.5)
WBC: 6.8 K/uL (ref 4.0–10.5)

## 2013-10-09 NOTE — Patient Instructions (Signed)
-    Recommend Dr Fara Olden Fuhrman's book "The End of Diabetes " or "End of Dieting" - Can get at  [email or web address].com and encourage also get the Audio CD book  Biggest suggestion in increase veggies!  Keep food diary with food journal.

## 2013-10-09 NOTE — Progress Notes (Signed)
Assessment and Plan:  Hypertension: Continue medication, monitor blood pressure at home. Continue DASH diet. Cholesterol: Continue diet and exercise. Check cholesterol.  Diabetes-Continue diet and exercise. Check A1C Vitamin D Def- check level and continue medications.  Obesity with co morbidities- long discussion about weight loss, diet, and exercise, increase veggies  Continue diet and meds as discussed. Further disposition pending results of labs. Discussed med's effects and SE's.    HPI 63 y.o. female  presents for 3 month follow up with hypertension, hyperlipidemia, diabetes and vitamin D. Her blood pressure has been controlled at home, today their BP is BP: 122/68 mmHg She does workout. She denies chest pain, shortness of breath, dizziness.  She is not on cholesterol medication statin but is on fenofibrate and denies myalgias. Her cholesterol is at goal. The cholesterol last visit was:   Lab Results  Component Value Date   CHOL 202* 07/04/2013   HDL 87 07/04/2013   LDLCALC 103* 07/04/2013   TRIG 59 07/04/2013   CHOLHDL 2.3 07/04/2013   She has been working on diet and exercise for Diabetes, and denies paresthesia of the feet, polydipsia and polyuria. Last A1C in the office was:  Lab Results  Component Value Date   HGBA1C 6.0* 07/04/2013   Patient is on Vitamin D supplement. Lab Results  Component Value Date   VD25OH 88 07/04/2013     BMI is Body mass index is 31.73 kg/(m^2)., she is struggling with weight loss, she has had lap band.  Wt Readings from Last 3 Encounters:  10/09/13 182 lb (82.555 kg)  07/04/13 175 lb (79.379 kg)  05/30/13 171 lb (77.565 kg)      Current Medications:  Current Outpatient Prescriptions on File Prior to Visit  Medication Sig Dispense Refill  . acetaminophen (TYLENOL) 650 MG CR tablet Take 650 mg by mouth every 8 (eight) hours as needed for pain. Takes prn      . acidophilus (RISAQUAD) CAPS Take 1 capsule by mouth daily.      Marland Kitchen ALPRAZolam  (XANAX) 1 MG tablet TAKE 1/2 OR 1 TABLET BY MOUTH 3 TIMES A DAY  90 tablet  1  . aspirin 81 MG tablet Take 81 mg by mouth daily.      Marland Kitchen atenolol (TENORMIN) 100 MG tablet TAKE 1 TABLET EVERY DAY  90 tablet  6  . b complex vitamins capsule Take 1 capsule by mouth daily.      . calcium carbonate (TUMS) 500 MG chewable tablet Chew 1,500 mg by mouth at bedtime.      . Cholecalciferol (VITAMIN D3) 5000 UNITS CAPS Take 5,000 Units by mouth daily.      . cyclobenzaprine (FLEXERIL) 10 MG tablet TAKE 1/2 TO 1 TABLET BY MOUTH 3 TIMES A DAY AS NEEDED FOR MUSCLE SPASM  30 tablet  0  . fenofibrate micronized (LOFIBRA) 134 MG capsule TAKE ONE CAPSULE BY MOUTH DAILY  30 capsule  5  . Ferrous Sulfate (IRON) 325 (65 FE) MG TABS Take 325 mg by mouth daily.      . hydrochlorothiazide (HYDRODIURIL) 25 MG tablet TAKE 1 TABLET BY MOUTH ONCE EVERY MORNING FOR BLOOD PRESSURE AND FLUID  90 tablet  1  . hydrocortisone (ANUSOL-HC) 2.5 % rectal cream Place 1 application rectally 4 (four) times daily.  60 g  3  . Magnesium 250 MG TABS Take 250 mg by mouth 2 (two) times daily.      . metFORMIN (GLUCOPHAGE) 500 MG tablet TAKE 2 TABLETS (1,000 MG TOTAL)  BY MOUTH 2 (TWO) TIMES DAILY WITH A MEAL.  360 tablet  1  . metFORMIN (GLUCOPHAGE) 500 MG tablet TAKE 2 TABLETS (1,000 MG TOTAL) BY MOUTH 2 (TWO) TIMES DAILY WITH A MEAL.  360 tablet  1  . Multiple Vitamins-Minerals (MULTIVITAMIN WITH MINERALS) tablet Take 1 tablet by mouth 2 (two) times daily.      . verapamil (CALAN) 80 MG tablet Take 80 mg by mouth 2 (two) times daily.       No current facility-administered medications on file prior to visit.   Medical History:  Past Medical History  Diagnosis Date  . Hypertension   . GERD (gastroesophageal reflux disease)   . H/O hiatal hernia   . 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 At risk for difficult insertion of breathing tube   . Type II or unspecified type diabetes mellitus without mention of complication, not stated  as uncontrolled 12/14/2012   Allergies:  Allergies  Allergen Reactions  . Codeine Itching  . Lipitor [Atorvastatin] Other (See Comments)    Change in memory.  . Ace Inhibitors Rash     Review of Systems: [X]  = complains of  [ ]  = denies  General: Fatigue [ ]  Fever [ ]  Chills [ ]  Weakness [ ]   Insomnia [ ]  Eyes: Redness [ ]  Blurred vision [ ]  Diplopia [ ]   ENT: Congestion [ ]  Sinus Pain [ ]  Post Nasal Drip [ ]  Sore Throat [ ]  Earache [ ]   Cardiac: Chest pain/pressure [ ]  SOB [ ]  Orthopnea [ ]   Palpitations [ ]   Paroxysmal nocturnal dyspnea[ ]  Claudication [ ]  Edema [ ]   Pulmonary: Cough [ ]  Wheezing[ ]   SOB [ ]   Snoring [ ]   GI: Nausea [ ]  Vomiting[ ]  Dysphagia[ ]  Heartburn[ ]  Abdominal pain [ ]  Constipation [ ] ; Diarrhea [ ] ; BRBPR [ ]  Melena[ ]  GU: Hematuria[ ]  Dysuria [ ]  Nocturia[ ]  Urgency [ ]   Hesitancy [ ]  Discharge [ ]  Neuro: Headaches[ ]  Vertigo[ ]  Paresthesias[ ]  Spasm [ ]  Speech changes [ ]  Incoordination [ ]   Ortho: Arthritis [ ]  Joint pain [ ]  Muscle pain [ ]  Joint swelling [ ]  Back Pain [ ]  Skin:  Rash [ ]   Pruritis [ ]  Change in skin lesion [ ]   Psych: Depression[ ]  Anxiety[ ]  Confusion [ ]  Memory loss [ ]   Heme/Lypmh: Bleeding [ ]  Bruising [ ]  Enlarged lymph nodes [ ]   Endocrine: Visual blurring [ ]  Paresthesia [ ]  Polyuria [ ]  Polydypsea [ ]    Heat/cold intolerance [ ]  Hypoglycemia [ ]   Family history- Review and unchanged Social history- Review and unchanged Physical Exam: BP 122/68  Pulse 76  Temp(Src) 98.1 F (36.7 C)  Resp 16  Ht 5' 3.5" (1.613 m)  Wt 182 lb (82.555 kg)  BMI 31.73 kg/m2 Wt Readings from Last 3 Encounters:  10/09/13 182 lb (82.555 kg)  07/04/13 175 lb (79.379 kg)  05/30/13 171 lb (77.565 kg)   General Appearance: Well nourished, in no apparent distress. Eyes: PERRLA, EOMs, conjunctiva no swelling or erythema Sinuses: No Frontal/maxillary tenderness ENT/Mouth: Ext aud canals clear, TMs without erythema, bulging. No erythema, swelling,  or exudate on post pharynx.  Tonsils not swollen or erythematous. Hearing normal.  Neck: Supple, thyroid normal.  Respiratory: Respiratory effort normal, BS equal bilaterally without rales, rhonchi, wheezing or stridor.  Cardio: RRR with no MRGs. Brisk peripheral pulses without edema.  Abdomen: Soft, + BS.  Non tender, no guarding, rebound,  hernias, masses. Lymphatics: Non tender without lymphadenopathy.  Musculoskeletal: Full ROM, 5/5 strength, normal gait.  Skin: Warm, dry without rashes, lesions, ecchymosis.  Neuro: Cranial nerves intact. No cerebellar symptoms. Sensation intact.  Psych: Awake and oriented X 3, normal affect, Insight and Judgment appropriate.    Vicie Mutters 4:34 PM

## 2013-10-10 LAB — MAGNESIUM: Magnesium: 2 mg/dL (ref 1.5–2.5)

## 2013-10-10 LAB — BASIC METABOLIC PANEL WITH GFR
BUN: 18 mg/dL (ref 6–23)
CO2: 30 mEq/L (ref 19–32)
Calcium: 10.1 mg/dL (ref 8.4–10.5)
Chloride: 100 mEq/L (ref 96–112)
Creat: 0.94 mg/dL (ref 0.50–1.10)
GFR, Est African American: 75 mL/min
GFR, Est Non African American: 65 mL/min
GLUCOSE: 94 mg/dL (ref 70–99)
POTASSIUM: 3.9 meq/L (ref 3.5–5.3)
SODIUM: 139 meq/L (ref 135–145)

## 2013-10-10 LAB — HEPATIC FUNCTION PANEL
ALT: 19 U/L (ref 0–35)
AST: 16 U/L (ref 0–37)
Albumin: 4.4 g/dL (ref 3.5–5.2)
Alkaline Phosphatase: 31 U/L — ABNORMAL LOW (ref 39–117)
BILIRUBIN DIRECT: 0.1 mg/dL (ref 0.0–0.3)
BILIRUBIN INDIRECT: 0.3 mg/dL (ref 0.2–1.2)
Total Bilirubin: 0.4 mg/dL (ref 0.2–1.2)
Total Protein: 6.7 g/dL (ref 6.0–8.3)

## 2013-10-10 LAB — LIPID PANEL
Cholesterol: 209 mg/dL — ABNORMAL HIGH (ref 0–200)
HDL: 96 mg/dL (ref >39)
LDL CALC: 102 mg/dL — AB (ref 0–99)
Total CHOL/HDL Ratio: 2.2 Ratio
Triglycerides: 53 mg/dL (ref <150)
VLDL: 11 mg/dL (ref 0–40)

## 2013-10-10 LAB — HEMOGLOBIN A1C
HEMOGLOBIN A1C: 6.1 % — AB (ref <5.7)
MEAN PLASMA GLUCOSE: 128 mg/dL — AB (ref <117)

## 2013-10-10 LAB — TSH: TSH: 2.905 u[IU]/mL (ref 0.350–4.500)

## 2013-10-10 LAB — VITAMIN D 25 HYDROXY (VIT D DEFICIENCY, FRACTURES): Vit D, 25-Hydroxy: 87 ng/mL (ref 30–89)

## 2013-10-20 ENCOUNTER — Other Ambulatory Visit: Payer: Self-pay | Admitting: Internal Medicine

## 2013-10-25 ENCOUNTER — Other Ambulatory Visit: Payer: Self-pay | Admitting: Physician Assistant

## 2013-10-27 ENCOUNTER — Other Ambulatory Visit: Payer: Self-pay | Admitting: Physician Assistant

## 2013-11-13 ENCOUNTER — Ambulatory Visit
Admission: RE | Admit: 2013-11-13 | Discharge: 2013-11-13 | Disposition: A | Discharge status: Home / Self Care | Payer: 59 | Source: Ambulatory Visit

## 2013-11-13 DIAGNOSIS — Z1231 Encounter for screening mammogram for malignant neoplasm of breast: Secondary | ICD-10-CM

## 2013-11-16 ENCOUNTER — Other Ambulatory Visit (INDEPENDENT_AMBULATORY_CARE_PROVIDER_SITE_OTHER): Payer: Self-pay | Admitting: Physician Assistant

## 2013-11-16 ENCOUNTER — Other Ambulatory Visit (INDEPENDENT_AMBULATORY_CARE_PROVIDER_SITE_OTHER): Payer: Self-pay | Admitting: *Deleted

## 2013-11-16 DIAGNOSIS — K219 Gastro-esophageal reflux disease without esophagitis: Secondary | ICD-10-CM

## 2013-11-16 DIAGNOSIS — Z9884 Bariatric surgery status: Secondary | ICD-10-CM

## 2013-11-17 ENCOUNTER — Ambulatory Visit
Admission: RE | Admit: 2013-11-17 | Discharge: 2013-11-17 | Disposition: A | Discharge status: Home / Self Care | Payer: 59 | Source: Ambulatory Visit | Attending: Physician Assistant | Admitting: Physician Assistant

## 2013-11-17 DIAGNOSIS — K219 Gastro-esophageal reflux disease without esophagitis: Secondary | ICD-10-CM

## 2013-11-17 DIAGNOSIS — Z9884 Bariatric surgery status: Secondary | ICD-10-CM

## 2013-12-01 ENCOUNTER — Other Ambulatory Visit: Payer: Self-pay | Admitting: Physician Assistant

## 2013-12-18 ENCOUNTER — Ambulatory Visit (INDEPENDENT_AMBULATORY_CARE_PROVIDER_SITE_OTHER): Payer: 59 | Admitting: Internal Medicine

## 2013-12-18 ENCOUNTER — Encounter: Payer: Self-pay | Admitting: Internal Medicine

## 2013-12-18 VITALS — BP 124/82 | HR 60 | Temp 98.4°F | Resp 16 | Ht 63.5 in | Wt 186.2 lb

## 2013-12-18 DIAGNOSIS — Z111 Encounter for screening for respiratory tuberculosis: Secondary | ICD-10-CM

## 2013-12-18 DIAGNOSIS — E782 Mixed hyperlipidemia: Secondary | ICD-10-CM

## 2013-12-18 DIAGNOSIS — I10 Essential (primary) hypertension: Secondary | ICD-10-CM

## 2013-12-18 DIAGNOSIS — Z23 Encounter for immunization: Secondary | ICD-10-CM

## 2013-12-18 DIAGNOSIS — N189 Chronic kidney disease, unspecified: Secondary | ICD-10-CM

## 2013-12-18 DIAGNOSIS — E1122 Type 2 diabetes mellitus with diabetic chronic kidney disease: Secondary | ICD-10-CM

## 2013-12-18 DIAGNOSIS — E559 Vitamin D deficiency, unspecified: Secondary | ICD-10-CM

## 2013-12-18 DIAGNOSIS — Z79899 Other long term (current) drug therapy: Secondary | ICD-10-CM

## 2013-12-18 DIAGNOSIS — Z0001 Encounter for general adult medical examination with abnormal findings: Secondary | ICD-10-CM

## 2013-12-18 DIAGNOSIS — R6889 Other general symptoms and signs: Secondary | ICD-10-CM

## 2013-12-18 DIAGNOSIS — Z1212 Encounter for screening for malignant neoplasm of rectum: Secondary | ICD-10-CM

## 2013-12-18 LAB — CBC WITH DIFFERENTIAL/PLATELET
BASOS ABS: 0.1 10*3/uL (ref 0.0–0.1)
BASOS PCT: 1 % (ref 0–1)
Eosinophils Absolute: 0.1 10*3/uL (ref 0.0–0.7)
Eosinophils Relative: 2 % (ref 0–5)
HCT: 38.2 % (ref 36.0–46.0)
Hemoglobin: 13 g/dL (ref 12.0–15.0)
Lymphocytes Relative: 41 % (ref 12–46)
Lymphs Abs: 2.6 10*3/uL (ref 0.7–4.0)
MCH: 30 pg (ref 26.0–34.0)
MCHC: 34 g/dL (ref 30.0–36.0)
MCV: 88.2 fL (ref 78.0–100.0)
MONO ABS: 0.5 10*3/uL (ref 0.1–1.0)
MPV: 8.6 fL — ABNORMAL LOW (ref 9.4–12.4)
Monocytes Relative: 8 % (ref 3–12)
Neutro Abs: 3 10*3/uL (ref 1.7–7.7)
Neutrophils Relative %: 48 % (ref 43–77)
PLATELETS: 443 10*3/uL — AB (ref 150–400)
RBC: 4.33 MIL/uL (ref 3.87–5.11)
RDW: 13.9 % (ref 11.5–15.5)
WBC: 6.3 10*3/uL (ref 4.0–10.5)

## 2013-12-18 MED ORDER — PHENTERMINE HCL 37.5 MG PO TABS: ORAL_TABLET | ORAL | Status: DC

## 2013-12-18 NOTE — Patient Instructions (Signed)
Recommend the book "The END of DIETING" by Dr Baker Janus   and the book "The END of DIABETES " by Dr Excell Seltzer  At [email or web address] - get book & Audio CD's      Being diabetic has a  300% increased risk for heart attack, stroke, cancer, and alzheimer- type vascular dementia. It is very important that you work harder with diet by avoiding all foods that are white except chicken & fish. Avoid white rice (brown & wild rice is OK), white potatoes (sweetpotatoes in moderation is OK), White bread or wheat bread or anything made out of white flour like bagels, donuts, rolls, buns, biscuits, cakes, pastries, cookies, pizza crust, and pasta (made from white flour & egg whites) - vegetarian pasta or spinach or wheat pasta is OK. Multigrain breads like Arnold's or [employer], or multigrain sandwich thins or flatbreads.  Diet, exercise and weight loss can reverse and cure diabetes in the early stages.  Diet, exercise and weight loss is very important in the control and prevention of complications of diabetes which affects every system in your body, ie. Brain - dementia/stroke, eyes - glaucoma/blindness, heart - heart attack/heart failure, kidneys - dialysis, stomach - gastric paralysis, intestines - malabsorption, nerves - severe painful neuritis, circulation - gangrene & loss of a leg(s), and finally cancer and Alzheimers.    I recommend avoid fried & greasy foods,  sweets/candy, white rice (brown or wild rice or Quinoa is OK), white potatoes (sweet potatoes are OK) - anything made from white flour - bagels, doughnuts, rolls, buns, biscuits,white and wheat breads, pizza crust and traditional pasta made of white flour & egg white(vegetarian pasta or spinach or wheat pasta is OK).  Multi-grain bread is OK - like multi-grain flat bread or sandwich thins. Avoid alcohol in excess. Exercise is also important.    Eat all the vegetables you want - avoid meat, especially red meat and dairy - especially cheese.  Cheese  is the most concentrated form of trans-fats which is the worst thing to clog up our arteries. Veggie cheese is OK which can be found in the fresh produce section at [employer] or [employer] or [employer]  Preventive Care for Adults A healthy lifestyle and preventive care can promote health and wellness. Preventive health guidelines for women include the following key practices.  A routine yearly physical is a good way to check with your health care provider about your health and preventive screening. It is a chance to share any concerns and updates on your health and to receive a thorough exam.  Visit your dentist for a routine exam and preventive care every 6 months. Brush your teeth twice a day and floss once a day. Good oral hygiene prevents tooth decay and gum disease.  The frequency of eye exams is based on your age, health, family medical history, use of contact lenses, and other factors. Follow your health care provider's recommendations for frequency of eye exams.  Eat a healthy diet. Foods like vegetables, fruits, whole grains, low-fat dairy products, and lean protein foods contain the nutrients you need without too many calories. Decrease your intake of foods high in solid fats, added sugars, and salt. Eat the right amount of calories for you.Get information about a proper diet from your health care provider, if necessary.  Regular physical exercise is one of the most important things you can do for your health. Most adults should get at least 150 minutes of moderate-intensity exercise (any activity that increases  your heart rate and causes you to sweat) each week. In addition, most adults need muscle-strengthening exercises on 2 or more days a week.  Maintain a healthy weight. The body mass index (BMI) is a screening tool to identify possible weight problems. It provides an estimate of body fat based on height and weight. Your health care provider can find your BMI and can help you  achieve or maintain a healthy weight.For adults 20 years and older:  A BMI below 18.5 is considered underweight.  A BMI of 18.5 to 24.9 is normal.  A BMI of 25 to 29.9 is considered overweight.  A BMI of 30 and above is considered obese.  Maintain normal blood lipids and cholesterol levels by exercising and minimizing your intake of saturated fat. Eat a balanced diet with plenty of fruit and vegetables. Blood tests for lipids and cholesterol should begin at age 38 and be repeated every 5 years. If your lipid or cholesterol levels are high, you are over 50, or you are at high risk for heart disease, you may need your cholesterol levels checked more frequently.Ongoing high lipid and cholesterol levels should be treated with medicines if diet and exercise are not working.  If you smoke, find out from your health care provider how to quit. If you do not use tobacco, do not start.  Lung cancer screening is recommended for adults aged 64-80 years who are at high risk for developing lung cancer because of a history of smoking. A yearly low-dose CT scan of the lungs is recommended for people who have at least a 30-pack-year history of smoking and are a current smoker or have quit within the past 15 years. A pack year of smoking is smoking an average of 1 pack of cigarettes a day for 1 year (for example: 1 pack a day for 30 years or 2 packs a day for 15 years). Yearly screening should continue until the smoker has stopped smoking for at least 15 years. Yearly screening should be stopped for people who develop a health problem that would prevent them from having lung cancer treatment.  High blood pressure causes heart disease and increases the risk of stroke. Your blood pressure should be checked at least every 1 to 2 years. Ongoing high blood pressure should be treated with medicines if weight loss and exercise do not work.  If you are 79-57 years old, ask your health care provider if you should take  aspirin to prevent strokes.  Diabetes screening involves taking a blood sample to check your fasting blood sugar level. This should be done once every 3 years, after age 37, if you are within normal weight and without risk factors for diabetes. Testing should be considered at a younger age or be carried out more frequently if you are overweight and have at least 1 risk factor for diabetes.  Breast cancer screening is essential preventive care for women. You should practice "breast self-awareness." This means understanding the normal appearance and feel of your breasts and may include breast self-examination. Any changes detected, no matter how small, should be reported to a health care provider. Women in their 76s and 30s should have a clinical breast exam (CBE) by a health care provider as part of a regular health exam every 1 to 3 years. After age 55, women should have a CBE every year. Starting at age 79, women should consider having a mammogram (breast X-ray test) every year. Women who have a family history of breast  cancer should talk to their health care provider about genetic screening. Women at a high risk of breast cancer should talk to their health care providers about having an MRI and a mammogram every year.  Breast cancer gene (BRCA)-related cancer risk assessment is recommended for women who have family members with BRCA-related cancers. BRCA-related cancers include breast, ovarian, tubal, and peritoneal cancers. Having family members with these cancers may be associated with an increased risk for harmful changes (mutations) in the breast cancer genes BRCA1 and BRCA2. Results of the assessment will determine the need for genetic counseling and BRCA1 and BRCA2 testing.  Routine pelvic exams to screen for cancer are no longer recommended for nonpregnant women who are considered low risk for cancer of the pelvic organs (ovaries, uterus, and vagina) and who do not have symptoms. Ask your health  care provider if a screening pelvic exam is right for you.  If you have had past treatment for cervical cancer or a condition that could lead to cancer, you need Pap tests and screening for cancer for at least 20 years after your treatment. If Pap tests have been discontinued, your risk factors (such as having a new sexual partner) need to be reassessed to determine if screening should be resumed. Some women have medical problems that increase the chance of getting cervical cancer. In these cases, your health care provider may recommend more frequent screening and Pap tests.  Colorectal cancer can be detected and often prevented. Most routine colorectal cancer screening begins at the age of 58 years and continues through age 44 years. However, your health care provider may recommend screening at an earlier age if you have risk factors for colon cancer. On a yearly basis, your health care provider may provide home test kits to check for hidden blood in the stool. Use of a small camera at the end of a tube, to directly examine the colon (sigmoidoscopy or colonoscopy), can detect the earliest forms of colorectal cancer. Talk to your health care provider about this at age 84, when routine screening begins. Direct exam of the colon should be repeated every 5-10 years through age 33 years, unless early forms of pre-cancerous polyps or small growths are found.  Hepatitis C blood testing is recommended for all people born from [birth year] through 1965 and any individual with known risks for hepatitis C.  Pra  Osteoporosis is a disease in which the bones lose minerals and strength with aging. This can result in serious bone fractures or breaks. The risk of osteoporosis can be identified using a bone density scan. Women ages 36 years and over and women at risk for fractures or osteoporosis should discuss screening with their health care providers. Ask your health care provider whether you should take a calcium supplement  or vitamin D to reduce the rate of osteoporosis.  Menopause can be associated with physical symptoms and risks. Hormone replacement therapy is available to decrease symptoms and risks. You should talk to your health care provider about whether hormone replacement therapy is right for you.  Use sunscreen. Apply sunscreen liberally and repeatedly throughout the day. You should seek shade when your shadow is shorter than you. Protect yourself by wearing long sleeves, pants, a wide-brimmed hat, and sunglasses year round, whenever you are outdoors.  Once a month, do a whole body skin exam, using a mirror to look at the skin on your back. Tell your health care provider of new moles, moles that have irregular borders, moles that are  larger than a pencil eraser, or moles that have changed in shape or color.  Stay current with required vaccines (immunizations).  Influenza vaccine. All adults should be immunized every year.  Tetanus, diphtheria, and acellular pertussis (Td, Tdap) vaccine. Pregnant women should receive 1 dose of Tdap vaccine during each pregnancy. The dose should be obtained regardless of the length of time since the last dose. Immunization is preferred during the 27th-36th week of gestation. An adult who has not previously received Tdap or who does not know her vaccine status should receive 1 dose of Tdap. This initial dose should be followed by tetanus and diphtheria toxoids (Td) booster doses every 10 years. Adults with an unknown or incomplete history of completing a 3-dose immunization series with Td-containing vaccines should begin or complete a primary immunization series including a Tdap dose. Adults should receive a Td booster every 10 years.  Varicella vaccine. An adult without evidence of immunity to varicella should receive 2 doses or a second dose if she has previously received 1 dose. Pregnant females who do not have evidence of immunity should receive the first dose after  pregnancy. This first dose should be obtained before leaving the health care facility. The second dose should be obtained 4-8 weeks after the first dose.  Human papillomavirus (HPV) vaccine. Females aged 13-26 years who have not received the vaccine previously should obtain the 3-dose series. The vaccine is not recommended for use in pregnant females. However, pregnancy testing is not needed before receiving a dose. If a female is found to be pregnant after receiving a dose, no treatment is needed. In that case, the remaining doses should be delayed until after the pregnancy. Immunization is recommended for any person with an immunocompromised condition through the age of 26 years if she did not get any or all doses earlier. During the 3-dose series, the second dose should be obtained 4-8 weeks after the first dose. The third dose should be obtained 24 weeks after the first dose and 16 weeks after the second dose.  Zoster vaccine. One dose is recommended for adults aged 60 years or older unless certain conditions are present.  Measles, mumps, and rubella (MMR) vaccine. Adults born before 1957 generally are considered immune to measles and mumps. Adults born in 1957 or later should have 1 or more doses of MMR vaccine unless there is a contraindication to the vaccine or there is laboratory evidence of immunity to each of the three diseases. A routine second dose of MMR vaccine should be obtained at least 28 days after the first dose for students attending postsecondary schools, health care workers, or international travelers. People who received inactivated measles vaccine or an unknown type of measles vaccine during 1963-1967 should receive 2 doses of MMR vaccine. People who received inactivated mumps vaccine or an unknown type of mumps vaccine before 1979 and are at high risk for mumps infection should consider immunization with 2 doses of MMR vaccine. For females of childbearing age, rubella immunity should  be determined. If there is no evidence of immunity, females who are not pregnant should be vaccinated. If there is no evidence of immunity, females who are pregnant should delay immunization until after pregnancy. Unvaccinated health care workers born before 1957 who lack laboratory evidence of measles, mumps, or rubella immunity or laboratory confirmation of disease should consider measles and mumps immunization with 2 doses of MMR vaccine or rubella immunization with 1 dose of MMR vaccine.  Pneumococcal 13-valent conjugate (PCV13) vaccine. When   indicated, a person who is uncertain of her immunization history and has no record of immunization should receive the PCV13 vaccine. An adult aged 73 years or older who has certain medical conditions and has not been previously immunized should receive 1 dose of PCV13 vaccine. This PCV13 should be followed with a dose of pneumococcal polysaccharide (PPSV23) vaccine. The PPSV23 vaccine dose should be obtained at least 8 weeks after the dose of PCV13 vaccine. An adult aged 81 years or older who has certain medical conditions and previously received 1 or more doses of PPSV23 vaccine should receive 1 dose of PCV13. The PCV13 vaccine dose should be obtained 1 or more years after the last PPSV23 vaccine dose.    Pneumococcal polysaccharide (PPSV23) vaccine. When PCV13 is also indicated, PCV13 should be obtained first. All adults aged 69 years and older should be immunized. An adult younger than age 35 years who has certain medical conditions should be immunized. Any person who resides in a nursing home or long-term care facility should be immunized. An adult smoker should be immunized. People with an immunocompromised condition and certain other conditions should receive both PCV13 and PPSV23 vaccines. People with human immunodeficiency virus (HIV) infection should be immunized as soon as possible after diagnosis. Immunization during chemotherapy or radiation therapy should  be avoided. Routine use of PPSV23 vaccine is not recommended for American Indians, [place]atives, or people younger than 65 years unless there are medical conditions that require PPSV23 vaccine. When indicated, people who have unknown immunization and have no record of immunization should receive PPSV23 vaccine. One-time revaccination 5 years after the first dose of PPSV23 is recommended for people aged 19-64 years who have chronic kidney failure, nephrotic syndrome, asplenia, or immunocompromised conditions. People who received 1-2 doses of PPSV23 before age 79 years should receive another dose of PPSV23 vaccine at age 73 years or later if at least 5 years have passed since the previous dose. Doses of PPSV23 are not needed for people immunized with PPSV23 at or after age 19 years.  Preventive Services / Frequency   Ages 25 to 65 years  Blood pressure check.  Lipid and cholesterol check.  Lung cancer screening. / Every year if you are aged 8-80 years and have a 30-pack-year history of smoking and currently smoke or have quit within the past 15 years. Yearly screening is stopped once you have quit smoking for at least 15 years or develop a health problem that would prevent you from having lung cancer treatment.  Clinical breast exam.** / Every year after age 28 years.  BRCA-related cancer risk assessment.** / For women who have family members with a BRCA-related cancer (breast, ovarian, tubal, or peritoneal cancers).  Mammogram.** / Every year beginning at age 57 years and continuing for as long as you are in good health. Consult with your health care provider.  Pap test.** / Every 3 years starting at age 57 years through age 25 or 13 years with a history of 3 consecutive normal Pap tests.  HPV screening.** / Every 3 years from ages 21 years through ages 60 to 48 years with a history of 3 consecutive normal Pap tests.  Fecal occult blood test (FOBT) of stool. / Every year beginning at age 77  years and continuing until age 57 years. You may not need to do this test if you get a colonoscopy every 10 years.  Flexible sigmoidoscopy or colonoscopy.** / Every 5 years for a flexible sigmoidoscopy or every 10 years  for a colonoscopy beginning at age 104 years and continuing until age 39 years.  Hepatitis C blood test.** / For all people born from [birth year] through 1964 and any individual with known risks for hepatitis C.  Skin self-exam. / Monthly.  Influenza vaccine. / Every year.  Tetanus, diphtheria, and acellular pertussis (Tdap/Td) vaccine.** / Consult your health care provider. Pregnant women should receive 1 dose of Tdap vaccine during each pregnancy. 1 dose of Td every 10 years.  Varicella vaccine.** / Consult your health care provider. Pregnant females who do not have evidence of immunity should receive the first dose after pregnancy.  Zoster vaccine.** / 1 dose for adults aged 47 years or older.  Pneumococcal 13-valent conjugate (PCV13) vaccine.** / Consult your health care provider.  Pneumococcal polysaccharide (PPSV23) vaccine.** / 1 to 2 doses if you smoke cigarettes or if you have certain conditions.  Meningococcal vaccine.** / Consult your health care provider.  Hepatitis A vaccine.** / Consult your health care provider.  Hepatitis B vaccine.** / Consult your health care provider. Screening for abdominal aortic aneurysm (AAA)  by ultrasound is recommended for people over 50 who have history of high blood pressure or who are current or former smokers.

## 2013-12-18 NOTE — Progress Notes (Signed)
Patient ID: Taylor Oconnor, female   DOB: 02/13/1950, 63 y.o.   MRN: [id number]  Annual Screening Comprehensive Examination  This very nice 63 y.o.MWF presents for complete physical.  Patient has been followed for HTN, Morbid Obesity, T2_NIDDM , Hyperlipidemia, and Vitamin D Deficiency.    HTN predates since 1995. Patient's BP has been controlled at home and patient denies any cardiac symptoms as chest pain, palpitations, shortness of breath, dizziness or ankle swelling. Today's BP: 124/82 mmHg    Patient's hyperlipidemia is controlled with diet and medications. Patient denies myalgias or other medication SE's. Last lipids were near goal - Total Chol  209*; HDL  96; LDL  102*; Trig 53 on 10/09/2013.   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 Last A1c was  6.1% on 10/09/2013. In 2008 patient had a Lap Band BiPass Surg (weight 229#) and is down to current weight on 186#.    Finally, patient has history of Vitamin D Deficiency of 28 in 2008 and last Vitamin D was 87 on 10/09/2013.   Medication Sig  . acetaminophen (TYLENOL) 650 MG CR tablet Take 650 mg by mouth every 8 (eight) hours as needed for pain. Takes prn  . acidophilus (RISAQUAD) CAPS Take 1 capsule by mouth daily.  Marland Kitchen ALPRAZolam (XANAX) 1 MG tablet TAKE 1/2 TO 1 TABLET 3 TIMES A DAY  . aspirin 81 MG tablet Take 81 mg by mouth daily.  Marland Kitchen atenolol (TENORMIN) 100 MG tablet TAKE 1 TABLET EVERY DAY  . b complex vitamins capsule Take 1 capsule by mouth daily.  . calcium carbonate (TUMS) 500 MG chewable tablet Chew 1,500 mg by mouth at bedtime.  . Cholecalciferol (VITAMIN D3) 5000 UNITS CAPS Take 5,000 Units by mouth daily.  . cyclobenzaprine (FLEXERIL) 10 MG tablet TAKE 1/2 TO 1 TABLET BY MOUTH 3 TIMES A DAY AS NEEDED FOR MUSCLE SPASM   . fenofibrate micronized (LOFIBRA) 134 MG capsule TAKE ONE CAPSULE BY MOUTH DAILY  . Ferrous Sulfate (IRON) 325 (65 FE) MG TABS Take 325 mg by mouth daily.  . hydrochlorothiazide (HYDRODIURIL) 25 MG tablet TAKE 1 TABLET BY MOUTH ONCE EVERY MORNING FOR BLOOD PRESSURE AND FLUID  . Magnesium 250 MG TABS Take 250 mg by mouth 2 (two) times daily.  . metFORMIN (GLUCOPHAGE) 500 MG tablet TAKE 2 TABLETS (1,000 MG TOTAL) BY MOUTH 2 (TWO) TIMES DAILY WITH A MEAL.  . Multiple Vitamins-Minerals (MULTIVITAMIN WITH MINERALS) tablet Take 1 tablet by mouth 2 (two) times daily.  . verapamil (CALAN) 80 MG tablet TAKE 1 TABLET BY MOUTH TWICE A DAY FOR BLOOD PRESSURE  . hydrocortisone (ANUSOL-HC) 2.5 % rectal cream Place 1 application rectally 4 (four) times daily. (Patient taking differently: Place 1 application rectally 4 (four) times daily. prn)   Allergies  Allergen Reactions  . Codeine Itching  . Lipitor [Atorvastatin] Other (See Comments)    Change in memory.  . Ace Inhibitors Rash   Past Medical History  Diagnosis Date  . Hypertension   . GERD (gastroesophageal reflux disease)   . H/O hiatal hernia   . 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 At risk for difficult insertion of breathing tube   . Type II or unspecified type diabetes mellitus without mention of complication, not stated as uncontrolled 12/14/2012   Health Maintenance  Topic  Date Due  . PNEUMOCOCCAL POLYSACCHARIDE VACCINE (2) 01/12/1997  . ZOSTAVAX  10/05/2011  . URINE MICROALBUMIN  12/14/2013  . OPHTHALMOLOGY EXAM  02/22/2014  . HEMOGLOBIN A1C  04/09/2014  . INFLUENZA VACCINE  08/13/2014  . COLONOSCOPY  09/30/2014  . FOOT EXAM  10/10/2014  . MAMMOGRAM  11/14/2015  . PAP SMEAR  12/15/2015  . TETANUS/TDAP  11/25/2022   Immunization History  Administered Date(s) Administered  . DTaP 01/12/2002  . Influenza Split 12/14/2012, 11/19/2013  . PPD Test 12/14/2012  . Pneumococcal Conjugate-13 10/09/2013  . Pneumococcal  Polysaccharide-23 01/13/1992   Past Surgical History  Procedure Laterality Date  . Cholecystectomy    . Diagnostic laparoscopy    . Hernia repair    . Tubal ligation    . Back surgery    . Laparoscopic appendectomy N/A 08/06/2012    Procedure: APPENDECTOMY LAPAROSCOPIC;  Surgeon: Earnstine Regal, MD;  Location: [facility];  Service: General;  Laterality: N/A;  . Appendectomy  08/06/12    lap appy  . Laparoscopic gastric banding with hiatal hernia repair N/A Nov 2008   Family History  Problem Relation Age of Onset  . Cancer Cousin     Breast  . Cirrhosis Mother   . Alcohol abuse Mother   . Diabetes Father   . Hypertension Father   . Stroke Father   . AAA (abdominal aortic aneurysm) Sister    History  Substance Use Topics  . Smoking status: Never Smoker   . Smokeless tobacco: Never Used  . Alcohol Use: No    ROS Constitutional: Denies fever, chills, weight loss/gain, headaches, insomnia, fatigue, night sweats, and change in appetite. Eyes: Denies redness, blurred vision, diplopia, discharge, itchy, watery eyes.  ENT: Denies discharge, congestion, post nasal drip, epistaxis, sore throat, earache, hearing loss, dental pain, Tinnitus, Vertigo, Sinus pain, snoring.  Cardio: Denies chest pain, palpitations, irregular heartbeat, syncope, dyspnea, diaphoresis, orthopnea, PND, claudication, edema Respiratory: denies cough, dyspnea, DOE, pleurisy, hoarseness, laryngitis, wheezing.  Gastrointestinal: Denies dysphagia, heartburn, reflux, water brash, pain, cramps, nausea, vomiting, bloating, diarrhea, constipation, hematemesis, melena, hematochezia, jaundice, hemorrhoids Genitourinary: Denies dysuria, frequency, urgency, nocturia, hesitancy, discharge, hematuria, flank pain Breast: Breast lumps, nipple discharge, bleeding.  Musculoskeletal: Denies arthralgia, myalgia, stiffness, Jt. Swelling, pain, limp, and strain/sprain. Denies falls. Skin: Denies puritis, rash, hives, warts, acne, eczema,  changing in skin lesion Neuro: No weakness, tremor, incoordination, spasms, paresthesia, pain Psychiatric: Denies confusion, memory loss, sensory loss. Denies Depression. Endocrine: Denies change in weight, skin, hair change, nocturia, and paresthesia, diabetic polys, visual blurring, hyper / hypo glycemic episodes.  Heme/Lymph: No excessive bleeding, bruising, enlarged lymph nodes.  Physical Exam  BP 124/82 mmHg  Pulse 60  Temp(Src) 98.4 F (36.9 C)  Resp 16  Ht 5' 3.5" (1.613 m)  Wt 186 lb 3.2 oz (84.46 kg)  BMI 32.46 kg/m2  General Appearance: Well nourished and in no apparent distress. Eyes: PERRLA, EOMs, conjunctiva no swelling or erythema, normal fundi and vessels. Sinuses: No frontal/maxillary tenderness ENT/Mouth: EACs patent / TMs  nl. Nares clear without erythema, swelling, mucoid exudates. Oral hygiene is good. No erythema, swelling, or exudate. Tongue normal, non-obstructing. Tonsils not swollen or erythematous. Hearing normal.  Neck: Supple, thyroid normal. No bruits, nodes or JVD. Respiratory: Respiratory effort normal.  BS equal and clear bilateral without rales, rhonci, wheezing or stridor. Cardio: Heart sounds are normal with regular rate and rhythm and no murmurs, rubs or gallops. Peripheral pulses are normal and equal bilaterally without edema. No aortic or femoral  bruits. Chest: symmetric with normal excursions and percussion. Breasts: Symmetric, without lumps, nipple discharge, retractions, or fibrocystic changes.  Abdomen: Flat, soft, with bowl sounds. Nontender, no guarding, rebound, hernias, masses, or organomegaly.  Lymphatics: Non tender without lymphadenopathy.  Genitourinary:  Musculoskeletal: Full ROM all peripheral extremities, joint stability, 5/5 strength, and normal gait. Skin: Warm and dry without rashes, lesions, cyanosis, clubbing or  ecchymosis.  Neuro: Cranial nerves intact, reflexes equal bilaterally. Normal muscle tone, no cerebellar symptoms.  Sensation intact by monofilament testing.  Pysch: Awake and oriented X 3, normal affect, Insight and Judgment appropriate.   Assessment and Plan  1. Annual Screening Examination 2. Hypertension  3. Hyperlipidemia 4. T2_NIDDM w/CKD2 5. Vitamin D Deficiency   Continue prudent diet as discussed, weight control, BP monitoring, regular exercise, and medications. Discussed med's effects and SE's. Screening labs and tests as requested with regular follow-up as recommended.

## 2013-12-19 LAB — IRON AND TIBC
%SAT: 21 % (ref 20–55)
IRON: 87 ug/dL (ref 42–145)
TIBC: 411 ug/dL (ref 250–470)
UIBC: 324 ug/dL (ref 125–400)

## 2013-12-19 LAB — BASIC METABOLIC PANEL WITH GFR
BUN: 12 mg/dL (ref 6–23)
CHLORIDE: 105 meq/L (ref 96–112)
CO2: 30 mEq/L (ref 19–32)
Calcium: 9.4 mg/dL (ref 8.4–10.5)
Creat: 0.78 mg/dL (ref 0.50–1.10)
GFR, Est African American: >89 mL/min
GFR, Est Non African American: 82 mL/min
Glucose, Bld: 91 mg/dL (ref 70–99)
Potassium: 4.4 mEq/L (ref 3.5–5.3)
SODIUM: 141 meq/L (ref 135–145)

## 2013-12-19 LAB — MICROALBUMIN / CREATININE URINE RATIO
Creatinine, Urine: 106.2 mg/dL
Microalb Creat Ratio: 4.7 mg/g (ref 0.0–30.0)
Microalb, Ur: 0.5 mg/dL (ref <2.0)

## 2013-12-19 LAB — URINALYSIS, MICROSCOPIC ONLY
Bacteria, UA: NONE SEEN
CASTS: NONE SEEN
CRYSTALS: NONE SEEN
SQUAMOUS EPITHELIAL / LPF: NONE SEEN

## 2013-12-19 LAB — LIPID PANEL
CHOL/HDL RATIO: 2.2 ratio
Cholesterol: 200 mg/dL (ref 0–200)
HDL: 91 mg/dL (ref >39)
LDL CALC: 96 mg/dL (ref 0–99)
Triglycerides: 65 mg/dL (ref <150)
VLDL: 13 mg/dL (ref 0–40)

## 2013-12-19 LAB — HEPATIC FUNCTION PANEL
ALK PHOS: 33 U/L — AB (ref 39–117)
ALT: 23 U/L (ref 0–35)
AST: 20 U/L (ref 0–37)
Albumin: 4.3 g/dL (ref 3.5–5.2)
BILIRUBIN DIRECT: 0.1 mg/dL (ref 0.0–0.3)
Indirect Bilirubin: 0.2 mg/dL (ref 0.2–1.2)
Total Bilirubin: 0.3 mg/dL (ref 0.2–1.2)
Total Protein: 6.5 g/dL (ref 6.0–8.3)

## 2013-12-19 LAB — HEMOGLOBIN A1C
Hgb A1c MFr Bld: 6 % — ABNORMAL HIGH (ref <5.7)
Mean Plasma Glucose: 126 mg/dL — ABNORMAL HIGH (ref <117)

## 2013-12-19 LAB — MAGNESIUM: MAGNESIUM: 2 mg/dL (ref 1.5–2.5)

## 2013-12-19 LAB — VITAMIN B12: Vitamin B-12: 1088 pg/mL — ABNORMAL HIGH (ref 211–911)

## 2013-12-19 LAB — TSH: TSH: 2.243 u[IU]/mL (ref 0.350–4.500)

## 2013-12-19 LAB — VITAMIN D 25 HYDROXY (VIT D DEFICIENCY, FRACTURES): VIT D 25 HYDROXY: 67 ng/mL (ref 30–100)

## 2013-12-19 LAB — INSULIN, FASTING: INSULIN FASTING, SERUM: 6.9 u[IU]/mL (ref 2.0–19.6)

## 2013-12-21 ENCOUNTER — Encounter: Payer: Self-pay | Admitting: Internal Medicine

## 2013-12-21 LAB — TB SKIN TEST
Induration: 0 mm
TB Skin Test: NEGATIVE

## 2013-12-29 ENCOUNTER — Other Ambulatory Visit: Payer: Self-pay

## 2013-12-29 MED ORDER — METFORMIN HCL 500 MG PO TABS: ORAL_TABLET | ORAL | Status: DC

## 2014-01-12 HISTORY — PX: BREAST LUMPECTOMY: SHX2

## 2014-01-16 ENCOUNTER — Other Ambulatory Visit: Payer: Self-pay | Admitting: Physician Assistant

## 2014-01-19 LAB — COLOGUARD

## 2014-01-24 ENCOUNTER — Telehealth: Payer: Self-pay | Admitting: *Deleted

## 2014-01-24 NOTE — Telephone Encounter (Signed)
Patient aware of negative cologuard test and per Dr Melford Aase, recheck in 5 years.

## 2014-01-27 ENCOUNTER — Other Ambulatory Visit: Payer: Self-pay | Admitting: Physician Assistant

## 2014-01-27 ENCOUNTER — Other Ambulatory Visit: Payer: Self-pay | Admitting: Internal Medicine

## 2014-03-01 ENCOUNTER — Other Ambulatory Visit: Payer: Self-pay | Admitting: *Deleted

## 2014-03-01 DIAGNOSIS — Z1212 Encounter for screening for malignant neoplasm of rectum: Secondary | ICD-10-CM

## 2014-03-01 LAB — POC HEMOCCULT BLD/STL (HOME/3-CARD/SCREEN)
Card #3 Fecal Occult Blood, POC: NEGATIVE
FECAL OCCULT BLD: NEGATIVE
FECAL OCCULT BLD: NEGATIVE

## 2014-03-03 ENCOUNTER — Encounter: Payer: Self-pay | Admitting: *Deleted

## 2014-03-07 ENCOUNTER — Other Ambulatory Visit: Payer: Self-pay | Admitting: Emergency Medicine

## 2014-03-22 ENCOUNTER — Ambulatory Visit: Payer: Self-pay | Admitting: Internal Medicine

## 2014-03-26 ENCOUNTER — Encounter: Payer: Self-pay | Admitting: Internal Medicine

## 2014-04-02 ENCOUNTER — Other Ambulatory Visit (INDEPENDENT_AMBULATORY_CARE_PROVIDER_SITE_OTHER): Payer: Self-pay

## 2014-04-03 ENCOUNTER — Encounter: Payer: Self-pay | Admitting: Physician Assistant

## 2014-04-03 ENCOUNTER — Ambulatory Visit (INDEPENDENT_AMBULATORY_CARE_PROVIDER_SITE_OTHER): Payer: 59 | Admitting: Physician Assistant

## 2014-04-03 VITALS — BP 130/82 | HR 68 | Temp 97.9°F | Resp 18 | Ht 63.5 in | Wt 187.2 lb

## 2014-04-03 DIAGNOSIS — Z9884 Bariatric surgery status: Secondary | ICD-10-CM

## 2014-04-03 DIAGNOSIS — E559 Vitamin D deficiency, unspecified: Secondary | ICD-10-CM

## 2014-04-03 DIAGNOSIS — I10 Essential (primary) hypertension: Secondary | ICD-10-CM

## 2014-04-03 DIAGNOSIS — E1122 Type 2 diabetes mellitus with diabetic chronic kidney disease: Secondary | ICD-10-CM

## 2014-04-03 DIAGNOSIS — Z79899 Other long term (current) drug therapy: Secondary | ICD-10-CM

## 2014-04-03 DIAGNOSIS — E782 Mixed hyperlipidemia: Secondary | ICD-10-CM

## 2014-04-03 NOTE — Patient Instructions (Signed)
Before you even begin to attack a weight-loss plan, it pays to remember this: You are not fat. You have fat. Losing weight isn't about blame or shame; it's simply another achievement to accomplish. Dieting is like any other skill-you have to buckle down and work at it. As long as you act in a smart, reasonable way, you'll ultimately get where you want to be. Here are some weight loss pearls for you.  1. It's Not a Diet. It's a Lifestyle Thinking of a diet as something you're on and suffering through only for the short term doesn't work. To shed weight and keep it off, you need to make permanent changes to the way you eat. It's OK to indulge occasionally, of course, but if you cut calories temporarily and then revert to your old way of eating, you'll gain back the weight quicker than you can say yo-yo. Use it to lose it. Research shows that one of the best predictors of long-term weight loss is how many pounds you drop in the first month. For that reason, nutritionists often suggest being stricter for the first two weeks of your new eating strategy to build momentum. Cut out added sugar and alcohol and avoid unrefined carbs. After that, figure out how you can reincorporate them in a way that's healthy and maintainable.  2. There's a Right Way to Exercise Working out burns calories and fat and boosts your metabolism by building muscle. But those trying to lose weight are notorious for overestimating the number of calories they burn and underestimating the amount they take in. Unfortunately, your system is biologically programmed to hold on to extra pounds and that means when you start exercising, your body senses the deficit and ramps up its hunger signals. If you're not diligent, you'll eat everything you burn and then some. Use it to lose it. Cardio gets all the exercise glory, but strength and interval training are the real heroes. They help you build lean muscle, which in turn increases your metabolism and  calorie-burning ability 3. Don't Overreact to Mild Hunger Some people have a hard time losing weight because of hunger anxiety. To them, being hungry is bad-something to be avoided at all costs-so they carry snacks with them and eat when they don't need to. Others eat because they're stressed out or bored. While you never want to get to the point of being ravenous (that's when bingeing is likely to happen), a hunger pang, a craving, or the fact that it's 3:00 p.m. should not send you racing for the vending machine or obsessing about the energy bar in your purse. Ideally, you should put off eating until your stomach is growling and it's difficult to concentrate.  Use it to lose it. When you feel the urge to eat, use the HALT method. Ask yourself, Am I really hungry? Or am I angry or anxious, lonely or bored, or tired? If you're still not certain, try the apple test. If you're truly hungry, an apple should seem delicious; if it doesn't, something else is going on. Or you can try drinking water and making yourself busy, if you are still hungry try a healthy snack.  4. Not All Calories Are Created Equal The mechanics of weight loss are pretty simple: Take in fewer calories than you use for energy. But the kind of food you eat makes all the difference. Processed food that's high in saturated fat and refined starch or sugar can cause inflammation that disrupts the hormone signals that tell  your brain you're full. The result: You eat a lot more.  Use it to lose it. Clean up your diet. Swap in whole, unprocessed foods, including vegetables, lean protein, and healthy fats that will fill you up and give you the biggest nutritional bang for your calorie buck. In a few weeks, as your brain starts receiving regular hunger and fullness signals once again, you'll notice that you feel less hungry overall and naturally start cutting back on the amount you eat.  5. Protein, Produce, and Plant-Based Fats Are Your Weight-Loss  [facility] Here's why eating the three Ps regularly will help you drop pounds. Protein fills you up. You need it to build lean muscle, which keeps your metabolism humming so that you can torch more fat. People in a weight-loss program who ate double the recommended daily allowance for protein (about 110 grams for a 150-pound woman) lost 70 percent of their weight from fat, while people who ate the RDA lost only about 40 percent, one study found. Produce is packed with filling fiber. "It's very difficult to consume too many calories if you're eating a lot of vegetables. Example: Three cups of broccoli is a lot of food, yet only 93 calories. (Fruit is another story. It can be easy to overeat and can contain a lot of calories from sugar, so be sure to monitor your intake.) Plant-based fats like olive oil and those in avocados and nuts are healthy and extra satiating.  Use it to lose it. Aim to incorporate each of the three Ps into every meal and snack. People who eat protein throughout the day are able to keep weight off, according to a study in the American Journal of Clinical Nutrition. In addition to meat, poultry and seafood, good sources are beans, lentils, eggs, tofu, and yogurt. As for fat, keep portion sizes in check by measuring out salad dressing, oil, and nut butters (shoot for one to two tablespoons). Finally, eat veggies or a little fruit at every meal. People who did that consumed 308 fewer calories but didn't feel any hungrier than when they didn't eat more produce.  7. How You Eat Is As Important As What You Eat In order for your brain to register that you're full, you need to focus on what you're eating. Sit down whenever you eat, preferably at a table. Turn off the TV or computer, put down your phone, and look at your food. Smell it. Chew slowly, and don't put another bite on your fork until you swallow. When women ate lunch this attentively, they consumed 30 percent less when snacking later than  those who listened to an audiobook at lunchtime, according to a study in the [employer] Journal of Nutrition. 8. Weighing Yourself Really Works The scale provides the best evidence about whether your efforts are paying off. Seeing the numbers tick up or down or stagnate is motivation to keep going-or to rethink your approach. A 2015 study at [employer] found that daily weigh-ins helped people lose more weight, keep it off, and maintain that loss, even after two years. Use it to lose it. Step on the scale at the same time every day for the best results. If your weight shoots up several pounds from one weigh-in to the next, don't freak out. Eating a lot of salt the night before or having your period is the likely culprit. The number should return to normal in a day or two. It's a steady climb that you need to do something about.   9. Too Much Stress and Too Little Sleep Are Your Enemies When you're tired and frazzled, your body cranks up the production of cortisol, the stress hormone that can cause carb cravings. Not getting enough sleep also boosts your levels of ghrelin, a hormone associated with hunger, while suppressing leptin, a hormone that signals fullness and satiety. People on a diet who slept only five and a half hours a night for two weeks lost 55 percent less fat and were hungrier than those who slept eight and a half hours, according to a study in the [employer] Journal. Use it to lose it. Prioritize sleep, aiming for seven hours or more a night, which research shows helps lower stress. And make sure you're getting quality zzz's. If a snoring spouse or a fidgety cat wakes you up frequently throughout the night, you may end up getting the equivalent of just four hours of sleep, according to a study from [employer]. Keep pets out of the bedroom, and use a white-noise app to drown out snoring. 10. You Will Hit a plateau-And You Can Bust Through It As you slim down, your  body releases much less leptin, the fullness hormone.  If you're not strength training, start right now. Building muscle can raise your metabolism to help you overcome a plateau. To keep your body challenged and burning calories, incorporate new moves and more intense intervals into your workouts or add another sweat session to your weekly routine. Alternatively, cut an extra 100 calories or so a day from your diet. Now that you've lost weight, your body simply doesn't need as much fuel.   Ways to cut 100 calories  1. Eat your eggs with hot sauce OR salsa instead of cheese.  Eggs are great for breakfast, but many people consider eggs and cheese to be BFFs. Instead of cheese-1 oz. of cheddar has 114 calories-top your eggs with hot sauce, which contains no calories and helps with satiety and metabolism. Salsa is also a great option!!  2. Top your toast, waffles or pancakes with mashed berries instead of jelly or syrup. Half a cup of berries-fresh, frozen or thawed-has about 40 calories, compared with 2 tbsp. of maple syrup or jelly, which both have about 100 calories. The berries will also give you a good punch of fiber, which helps keep you full and satisfied and won't spike blood sugar quickly like the jelly or syrup. 3. Swap the non-fat latte for black coffee with a splash of half-and-half. Contrary to its name, that non-fat latte has 130 calories and a startling 19g of carbohydrates per 16 oz. serving. Replacing that 'light' drinkable dessert with a black coffee with a splash of half-and-half saves you more than 100 calories per 16 oz. serving. 4. Sprinkle salads with freeze-dried raspberries instead of dried cranberries. If you want a sweet addition to your nutritious salad, stay away from dried cranberries. They have a whopping 130 calories per  cup and 30g carbohydrates. Instead, sprinkle freeze-dried raspberries guilt-free and save more than 100 calories per  cup serving, adding 3g of belly-filling  fiber. 5. Go for mustard in place of mayo on your sandwich. Mustard can add really nice flavor to any sandwich, and there are tons of varieties, from spicy to honey. A serving of mayo is 95 calories, versus 10 calories in a serving of mustard. 6. Choose a DIY salad dressing instead of the store-bought kind. Mix Dijon or whole grain mustard with low-fat Kefir or red wine vinegar   and garlic. 7. Use hummus as a spread instead of a dip. Use hummus as a spread on a high-fiber cracker or tortilla with a sandwich and save on calories without sacrificing taste. 8. Pick just one salad "accessory." Salad isn't automatically a calorie winner. It's easy to over-accessorize with toppings. Instead of topping your salad with nuts, avocado and cranberries (all three will clock in at 313 calories), just pick one. The next day, choose a different accessory, which will also keep your salad interesting. You don't wear all your jewelry every day, right? 9. Ditch the white pasta in favor of spaghetti squash. One cup of cooked spaghetti squash has about 40 calories, compared with traditional spaghetti, which comes with more than 200. Spaghetti squash is also nutrient-dense. It's a good source of fiber and Vitamins A and C, and it can be eaten just like you would eat pasta-with a great tomato sauce and [place] meatballs or with pesto, tofu and spinach, for example. 10. Dress up your chili, soups and stews with non-fat [place] yogurt instead of sour cream. Just a 'dollop' of sour cream can set you back 115 calories and a whopping 12g of fat-seven of which are of the artery-clogging variety. Added bonus: [place] yogurt is packed with muscle-building protein, calcium and B Vitamins. 11. Mash cauliflower instead of mashed potatoes. One cup of traditional mashed potatoes-in all their creamy goodness-has more than 200 calories, compared to mashed cauliflower, which you can typically eat for less than 100 calories per 1 cup serving.  Cauliflower is a great source of the antioxidant indole-3-carbinol (I3C), which may help reduce the risk of some cancers, like breast cancer. 12. Ditch the ice cream sundae in favor of a [place] yogurt parfait. Instead of a cup of ice cream or fro-yo for dessert, try 1 cup of nonfat Greek yogurt topped with fresh berries and a sprinkle of cacao nibs. Both toppings are packed with antioxidants, which can help reduce cellular inflammation and oxidative damage. And the comparison is a no-brainer: One cup of ice cream has about 275 calories; one cup of frozen yogurt has about 230; and a cup of Greek yogurt has just 130, plus twice the protein, so you're less likely to return to the freezer for a second helping. 13. Put olive oil in a spray container instead of using it directly from the bottle. Each tablespoon of olive oil is 120 calories and 15g of fat. Use a mister instead of pouring it straight into the pan or onto a salad. This allows for portion control and will save you more than 100 calories. 14. When baking, substitute canned pumpkin for butter or oil. Canned pumpkin-not pumpkin pie mix-is loaded with Vitamin A, which is important for skin and eye health, as well as immunity. And the comparisons are pretty crazy:  cup of canned pumpkin has about 40 calories, compared to butter or oil, which has more than 800 calories. Yes, 800 calories. Applesauce and mashed banana can also serve as good substitutions for butter or oil, usually in a 1:1 ratio. 15. Top casseroles with high-fiber cereal instead of breadcrumbs. Breadcrumbs are typically made with white bread, while breakfast cereals contain 5-9g of fiber per serving. Not only will you save more than 150 calories per  cup serving, the swap will also keep you more full and you'll get a metabolism boost from the added fiber. 16. Snack on pistachios instead of macadamia nuts. Believe it or not, you get the same amount of calories from 35  pistachios (100  calories) as you would from only five macadamia nuts. 17. Chow down on kale chips rather than potato chips. This is my favorite 'don't knock it 'till you try it' swap. Kale chips are so easy to make at home, and you can spice them up with a little grated parmesan or chili powder. Plus, they're a mere fraction of the calories of potato chips, but with the same crunch factor we crave so often. 18. Add seltzer and some fruit slices to your cocktail instead of soda or fruit juice. One cup of soda or fruit juice can pack on as much as 140 calories. Instead, use seltzer and fruit slices. The fruit provides valuable phytochemicals, such as flavonoids and anthocyanins, which help to combat cancer and stave off the aging process.  Diabetes is a very complicated disease...lets simplify it.  An easy way to look at it to understand the complications is if you think of the extra sugar floating in your blood stream as glass shards floating through your blood stream.    Diabetes affects your small vessels first: 1) The glass shards (sugar) scraps down the tiny blood vessels in your eyes and lead to diabetic retinopathy, the leading cause of blindness in the [place]. Diabetes is the leading cause of newly diagnosed adult (17 to 63 years of age) blindness in the [place].  2) The glass shards scratches down the tiny vessels of your legs leading to nerve damage called neuropathy and can lead to amputations of your feet. More than 60% of all non-traumatic amputations of lower limbs occur in people with diabetes.  3) Over time the small vessels in your brain are shredded and closed off, individually this does not cause any problems but over a long period of time many of the small vessels being blocked can lead to Vascular Dementia.   4) Your kidney's are a filter system and have a "net" that keeps certain things in the body and lets bad things out. Sugar shreds this net and leads to kidney damage and eventually  failure. Decreasing the sugar that is destroying the net and certain blood pressure medications can help stop or decrease progression of kidney disease. Diabetes was the primary cause of kidney failure in 44 percent of all new cases in 2011.  5) Diabetes also destroys the small vessels in your penis that lead to erectile dysfunction. Eventually the vessels are so damaged that you may not be responsive to cialis or viagra.   Diabetes and your large vessels: Your larger vessels consist of your coronary arteries in your heart and the carotid vessels to your brain. Diabetes or even increased sugars put you at 300% increased risk of heart attack and stroke and this is why.. The sugar scrapes down your large blood vessels and your body sees this as an internal injury and tries to repair itself. Just like you get a scab on your skin, your platelets will stick to the blood vessel wall trying to heal it. This is why we have diabetics on low dose aspirin daily, this prevents the platelets from sticking and can prevent plaque formation. In addition, your body takes cholesterol and tries to shove it into the open wound. This is why we want your LDL, or bad cholesterol, below 70.   The combination of platelets and cholesterol over 5-10 years forms plaque that can break off and cause a heart attack or stroke.   PLEASE REMEMBER:  Diabetes is preventable! Up to 85 percent  of complications and morbidities among individuals with type 2 diabetes can be prevented, delayed, or effectively treated and minimized with regular visits to a health professional, appropriate monitoring and medication, and a healthy diet and lifestyle.

## 2014-04-03 NOTE — Progress Notes (Signed)
Assessment and Plan:  1. Hypertension -Continue medication, monitor blood pressure at home. Continue DASH diet.  Reminder to go to the ER if any CP, SOB, nausea, dizziness, severe HA, changes vision/speech, left arm numbness and tingling and jaw pain.  2. Cholesterol -Continue diet and exercise. Check cholesterol.   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 Check A1C  4. Vitamin D Def - check level and continue medications.   5. Obesity with co morbidities-  long discussion about weight loss, diet, and exercise  6. Nodule/flap of skin on anus, 6 oclock.  Has seen Dr. Hassell Done will not remove it, has been irritated occ due to location and would like it removed.   Continue diet and meds as discussed. Further disposition pending results of labs. Discussed med's effects and SE's.   Over 30 minutes of exam, counseling, chart review, and critical decision making was performed  HPI 64 y.o. female  presents for 3 month follow up on hypertension, cholesterol, diabetes and vitamin D deficiency.   Her blood pressure has been controlled at home, today her BP is BP: 130/82 mmHg.  She does workout. She denies chest pain, shortness of breath, dizziness.  She is not on cholesterol medication, she is on fenofibrate and denies myalgias. Her cholesterol is at goal. The cholesterol was:   Lab Results  Component Value Date   CHOL 200 12/18/2013   HDL 91 12/18/2013   LDLCALC 96 12/18/2013   TRIG 65 12/18/2013   CHOLHDL 2.2 12/18/2013   She has been working on diet and exercise for diabetes with diabetic chronic kidney disease, she is on bASA, she is not on ACE/ARB due to an allergy, she is on MF 4 a day, and denies  paresthesia of the feet, polydipsia, polyuria and visual disturbances. Last A1C was:  Lab Results  Component Value Date   HGBA1C 6.0* 12/18/2013  Patient is on Vitamin D supplement. Lab Results  Component Value Date   VD25OH 67 12/18/2013  BMI is Body  mass index is 32.64 kg/(m^2)., she is working on diet and exercise, she had lap band surgery in 2008 and weight is down from 229.She is unable to tolerate phentermine.  Wt Readings from Last 3 Encounters:  04/03/14 187 lb 3.2 oz (84.913 kg)  12/18/13 186 lb 3.2 oz (84.46 kg)  10/09/13 182 lb (82.555 kg)    Current Medications:  Current Outpatient Prescriptions on File Prior to Visit  Medication Sig Dispense Refill  . acetaminophen (TYLENOL) 650 MG CR tablet Take 650 mg by mouth every 8 (eight) hours as needed for pain. Takes prn    . acidophilus (RISAQUAD) CAPS Take 1 capsule by mouth daily.    Marland Kitchen ALPRAZolam (XANAX) 1 MG tablet TAKE 1/2 TO 1 TABLET 3 TIMES A DAY AS NEEDED 90 tablet 5  . aspirin 81 MG tablet Take 81 mg by mouth daily.    Marland Kitchen atenolol (TENORMIN) 100 MG tablet TAKE 1 TABLET EVERY DAY 90 tablet 6  . b complex vitamins capsule Take 1 capsule by mouth daily.    . calcium carbonate (TUMS) 500 MG chewable tablet Chew 1,500 mg by mouth at bedtime.    . Cholecalciferol (VITAMIN D3) 5000 UNITS CAPS Take 5,000 Units by mouth daily.    . cyclobenzaprine (FLEXERIL) 10 MG tablet TAKE 1/2 TO 1 TABLET BY MOUTH 3 TIMES A DAY AS NEEDED FOR MUSCLE SPASM 30 tablet 0  . fenofibrate micronized (LOFIBRA) 134 MG capsule TAKE ONE CAPSULE BY  MOUTH DAILY 30 capsule 5  . fenofibrate micronized (LOFIBRA) 134 MG capsule TAKE ONE CAPSULE BY MOUTH DAILY 30 capsule 5  . Ferrous Sulfate (IRON) 325 (65 FE) MG TABS Take 325 mg by mouth daily.    Marland Kitchen FLUARIX QUADRIVALENT 0.5 ML injection   0  . hydrochlorothiazide (HYDRODIURIL) 25 MG tablet TAKE 1 TABLET EVERY MORNING FOR BLOOD PRESSURE AND FLUID 90 tablet 0  . Magnesium 250 MG TABS Take 250 mg by mouth 2 (two) times daily.    . metFORMIN (GLUCOPHAGE) 500 MG tablet TAKE 2 TABLETS (1,000 MG TOTAL) BY MOUTH 2 (TWO) TIMES DAILY WITH A MEAL. 360 tablet 3  . metFORMIN (GLUCOPHAGE) 500 MG tablet TAKE 2 TABLETS BY MOUTH TWICE A DAY WITH A MEAL 360 tablet 1  . Multiple  Vitamins-Minerals (MULTIVITAMIN WITH MINERALS) tablet Take 1 tablet by mouth 2 (two) times daily.    . verapamil (CALAN) 80 MG tablet TAKE 1 TABLET BY MOUTH TWICE A DAY FOR BLOOD PRESSURE 180 tablet 3  . hydrocortisone (ANUSOL-HC) 2.5 % rectal cream Place 1 application rectally 4 (four) times daily. (Patient taking differently: Place 1 application rectally 4 (four) times daily. prn) 60 g 3  . phentermine (ADIPEX-P) 37.5 MG tablet Take 1/2 to 1 tablet every morning for dieting & weight loss (Patient not taking: Reported on 04/03/2014) 30 tablet 5   No current facility-administered medications on file prior to visit.   Medical History:  Past Medical History  Diagnosis Date  . Hypertension   . GERD (gastroesophageal reflux disease)   . H/O hiatal hernia   . 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 At risk for difficult insertion of breathing tube   . Type II or unspecified type diabetes mellitus without mention of complication, not stated as uncontrolled 12/14/2012   Allergies:  Allergies  Allergen Reactions  . Codeine Itching  . Lipitor [Atorvastatin] Other (See Comments)    Change in memory.  . Ace Inhibitors Rash    Review of Systems:  Review of Systems  Constitutional: Negative.   HENT: Negative.   Eyes: Negative.   Respiratory: Negative.   Cardiovascular: Negative.   Gastrointestinal: Positive for diarrhea. Negative for heartburn, nausea, vomiting, abdominal pain, constipation, blood in stool and melena.  Genitourinary: Negative.   Musculoskeletal: Negative.   Skin: Negative.   Neurological: Negative.   Endo/Heme/Allergies: Negative.   Psychiatric/Behavioral: Negative.     Family history- Review and unchanged Social history- Review and unchanged Physical Exam: BP 130/82 mmHg  Pulse 68  Temp(Src) 97.9 F (36.6 C)  Resp 18  Ht 5' 3.5" (1.613 m)  Wt 187 lb 3.2 oz (84.913 kg)  BMI 32.64 kg/m2 Wt Readings from Last 3 Encounters:  04/03/14 187 lb 3.2 oz (84.913 kg)   12/18/13 186 lb 3.2 oz (84.46 kg)  10/09/13 182 lb (82.555 kg)   General Appearance: Well nourished, in no apparent distress. Eyes: PERRLA, EOMs, conjunctiva no swelling or erythema Sinuses: No Frontal/maxillary tenderness ENT/Mouth: Ext aud canals clear, TMs without erythema, bulging. No erythema, swelling, or exudate on post pharynx.  Tonsils not swollen or erythematous. Hearing normal.  Neck: Supple, thyroid normal.  Respiratory: Respiratory effort normal, BS equal bilaterally without rales, rhonchi, wheezing or stridor.  Cardio: RRR with no MRGs. Brisk peripheral pulses without edema.  Abdomen: Soft, + BS.  Non tender, no guarding, rebound, hernias, masses. Lymphatics: Non tender without lymphadenopathy.  Musculoskeletal: Full ROM, 5/5 strength, Normal gait Anus: at 6 o clock has nodule/flap that is  not erythematous, tender.  Skin: Warm, dry without rashes, lesions, ecchymosis.  Neuro: Cranial nerves intact. No cerebellar symptoms.  Psych: Awake and oriented X 3, normal affect, Insight and Judgment appropriate.    Vicie Mutters, PA-C 4:00 PM [facility]

## 2014-04-04 LAB — BASIC METABOLIC PANEL WITH GFR
BUN: 17 mg/dL (ref 6–23)
CALCIUM: 9.6 mg/dL (ref 8.4–10.5)
CO2: 31 mEq/L (ref 19–32)
Chloride: 101 mEq/L (ref 96–112)
Creat: 0.98 mg/dL (ref 0.50–1.10)
GFR, EST AFRICAN AMERICAN: 71 mL/min
GFR, EST NON AFRICAN AMERICAN: 62 mL/min
Glucose, Bld: 90 mg/dL (ref 70–99)
Potassium: 4.1 mEq/L (ref 3.5–5.3)
SODIUM: 140 meq/L (ref 135–145)

## 2014-04-04 LAB — CBC WITH DIFFERENTIAL/PLATELET
Basophils Absolute: 0 10*3/uL (ref 0.0–0.1)
Basophils Relative: 0 % (ref 0–1)
Eosinophils Absolute: 0.1 10*3/uL (ref 0.0–0.7)
Eosinophils Relative: 1 % (ref 0–5)
HEMATOCRIT: 39.1 % (ref 36.0–46.0)
HEMOGLOBIN: 13.3 g/dL (ref 12.0–15.0)
LYMPHS ABS: 3.1 10*3/uL (ref 0.7–4.0)
LYMPHS PCT: 41 % (ref 12–46)
MCH: 30.4 pg (ref 26.0–34.0)
MCHC: 34 g/dL (ref 30.0–36.0)
MCV: 89.3 fL (ref 78.0–100.0)
MONO ABS: 0.5 10*3/uL (ref 0.1–1.0)
MONOS PCT: 7 % (ref 3–12)
MPV: 8.5 fL — ABNORMAL LOW (ref 8.6–12.4)
NEUTROS ABS: 3.8 10*3/uL (ref 1.7–7.7)
NEUTROS PCT: 51 % (ref 43–77)
Platelets: 505 10*3/uL — ABNORMAL HIGH (ref 150–400)
RBC: 4.38 MIL/uL (ref 3.87–5.11)
RDW: 13.9 % (ref 11.5–15.5)
WBC: 7.5 10*3/uL (ref 4.0–10.5)

## 2014-04-04 LAB — HEPATIC FUNCTION PANEL
ALT: 19 U/L (ref 0–35)
AST: 17 U/L (ref 0–37)
Albumin: 4.5 g/dL (ref 3.5–5.2)
Alkaline Phosphatase: 34 U/L — ABNORMAL LOW (ref 39–117)
BILIRUBIN DIRECT: 0.1 mg/dL (ref 0.0–0.3)
BILIRUBIN TOTAL: 0.4 mg/dL (ref 0.2–1.2)
Indirect Bilirubin: 0.3 mg/dL (ref 0.2–1.2)
Total Protein: 6.6 g/dL (ref 6.0–8.3)

## 2014-04-04 LAB — TSH: TSH: 2.387 u[IU]/mL (ref 0.350–4.500)

## 2014-04-04 LAB — HEMOGLOBIN A1C
HEMOGLOBIN A1C: 5.9 % — AB (ref <5.7)
MEAN PLASMA GLUCOSE: 123 mg/dL — AB (ref <117)

## 2014-04-04 LAB — INSULIN, FASTING: Insulin fasting, serum: 9.3 u[IU]/mL (ref 2.0–19.6)

## 2014-04-04 LAB — LIPID PANEL
Cholesterol: 210 mg/dL — ABNORMAL HIGH (ref 0–200)
HDL: 79 mg/dL (ref >=46)
LDL CALC: 114 mg/dL — AB (ref 0–99)
Total CHOL/HDL Ratio: 2.7 Ratio
Triglycerides: 85 mg/dL (ref <150)
VLDL: 17 mg/dL (ref 0–40)

## 2014-04-04 LAB — VITAMIN D 25 HYDROXY (VIT D DEFICIENCY, FRACTURES): VIT D 25 HYDROXY: 62 ng/mL (ref 30–100)

## 2014-04-04 LAB — MAGNESIUM: MAGNESIUM: 2 mg/dL (ref 1.5–2.5)

## 2014-04-14 ENCOUNTER — Other Ambulatory Visit: Payer: Self-pay | Admitting: Internal Medicine

## 2014-04-26 ENCOUNTER — Encounter: Payer: Self-pay | Admitting: Internal Medicine

## 2014-04-26 ENCOUNTER — Ambulatory Visit (INDEPENDENT_AMBULATORY_CARE_PROVIDER_SITE_OTHER): Payer: 59 | Admitting: Internal Medicine

## 2014-04-26 VITALS — BP 128/78 | HR 72 | Temp 98.1°F | Resp 16 | Ht 63.5 in | Wt 188.0 lb

## 2014-04-26 DIAGNOSIS — J069 Acute upper respiratory infection, unspecified: Secondary | ICD-10-CM

## 2014-04-26 MED ORDER — MOMETASONE FUROATE 50 MCG/ACT NA SUSP
2.0000 | Freq: Every day | NASAL | Status: DC
Start: 2014-04-26 — End: 2014-07-10

## 2014-04-26 MED ORDER — DEXAMETHASONE SODIUM PHOSPHATE 100 MG/10ML IJ SOLN
10.0000 mg | Freq: Once | INTRAMUSCULAR | Status: AC
  Administered 2014-04-26: 10 mg via INTRAMUSCULAR

## 2014-04-26 MED ORDER — BENZONATATE 100 MG PO CAPS
100.0000 mg | ORAL_CAPSULE | Freq: Three times a day (TID) | ORAL | Status: DC | PRN
Start: 2014-04-26 — End: 2014-07-10

## 2014-04-26 MED ORDER — AZITHROMYCIN 250 MG PO TABS: ORAL_TABLET | ORAL | Status: DC

## 2014-04-26 MED ORDER — PHENYLEPH-PROMETHAZINE-COD 5-6.25-10 MG/5ML PO SYRP: 5.0000 mL | ORAL_SOLUTION | Freq: Three times a day (TID) | ORAL | Status: DC | PRN

## 2014-04-26 NOTE — Patient Instructions (Signed)
Upper Respiratory Infection, Adult An upper respiratory infection (URI) is also sometimes known as the common cold. The upper respiratory tract includes the nose, sinuses, throat, trachea, and bronchi. Bronchi are the airways leading to the lungs. Most people improve within 1 week, but symptoms can last up to 2 weeks. A residual cough may last even longer.  CAUSES Many different viruses can infect the tissues lining the upper respiratory tract. The tissues become irritated and inflamed and often become very moist. Mucus production is also common. A cold is contagious. You can easily spread the virus to others by oral contact. This includes kissing, sharing a glass, coughing, or sneezing. Touching your mouth or nose and then touching a surface, which is then touched by another person, can also spread the virus. SYMPTOMS  Symptoms typically develop 1 to 3 days after you come in contact with a cold virus. Symptoms vary from person to person. They may include:  Runny nose.  Sneezing.  Nasal congestion.  Sinus irritation.  Sore throat.  Loss of voice (laryngitis).  Cough.  Fatigue.  Muscle aches.  Loss of appetite.  Headache.  Low-grade fever. DIAGNOSIS  You might diagnose your own cold based on familiar symptoms, since most people get a cold 2 to 3 times a year. Your caregiver can confirm this based on your exam. Most importantly, your caregiver can check that your symptoms are not due to another disease such as strep throat, sinusitis, pneumonia, asthma, or epiglottitis. Blood tests, throat tests, and X-rays are not necessary to diagnose a common cold, but they may sometimes be helpful in excluding other more serious diseases. Your caregiver will decide if any further tests are required. RISKS AND COMPLICATIONS  You may be at risk for a more severe case of the common cold if you smoke cigarettes, have chronic heart disease (such as heart failure) or lung disease (such as asthma), or if  you have a weakened immune system. The very young and very old are also at risk for more serious infections. Bacterial sinusitis, middle ear infections, and bacterial pneumonia can complicate the common cold. The common cold can worsen asthma and chronic obstructive pulmonary disease (COPD). Sometimes, these complications can require emergency medical care and may be life-threatening. PREVENTION  The best way to protect against getting a cold is to practice good hygiene. Avoid oral or hand contact with people with cold symptoms. Wash your hands often if contact occurs. There is no clear evidence that vitamin C, vitamin E, echinacea, or exercise reduces the chance of developing a cold. However, it is always recommended to get plenty of rest and practice good nutrition. TREATMENT  Treatment is directed at relieving symptoms. There is no cure. Antibiotics are not effective, because the infection is caused by a virus, not by bacteria. Treatment may include:  Increased fluid intake. Sports drinks offer valuable electrolytes, sugars, and fluids.  Breathing heated mist or steam (vaporizer or shower).  Eating chicken soup or other clear broths, and maintaining good nutrition.  Getting plenty of rest.  Using gargles or lozenges for comfort.  Controlling fevers with ibuprofen or acetaminophen as directed by your caregiver.  Increasing usage of your inhaler if you have asthma. Zinc gel and zinc lozenges, taken in the first 24 hours of the common cold, can shorten the duration and lessen the severity of symptoms. Pain medicines may help with fever, muscle aches, and throat pain. A variety of non-prescription medicines are available to treat congestion and runny nose. Your caregiver   can make recommendations and may suggest nasal or lung inhalers for other symptoms.  HOME CARE INSTRUCTIONS   Only take over-the-counter or prescription medicines for pain, discomfort, or fever as directed by your  caregiver.  Use a warm mist humidifier or inhale steam from a shower to increase air moisture. This may keep secretions moist and make it easier to breathe.  Drink enough water and fluids to keep your urine clear or pale yellow.  Rest as needed.  Return to work when your temperature has returned to normal or as your caregiver advises. You may need to stay home longer to avoid infecting others. You can also use a face mask and careful hand washing to prevent spread of the virus. SEEK MEDICAL CARE IF:   After the first few days, you feel you are getting worse rather than better.  You need your caregiver's advice about medicines to control symptoms.  You develop chills, worsening shortness of breath, or brown or red sputum. These may be signs of pneumonia.  You develop yellow or brown nasal discharge or pain in the face, especially when you bend forward. These may be signs of sinusitis.  You develop a fever, swollen neck glands, pain with swallowing, or white areas in the back of your throat. These may be signs of strep throat. SEEK IMMEDIATE MEDICAL CARE IF:   You have a fever.  You develop severe or persistent headache, ear pain, sinus pain, or chest pain.  You develop wheezing, a prolonged cough, cough up blood, or have a change in your usual mucus (if you have chronic lung disease).  You develop sore muscles or a stiff neck. Document Released: 06/24/2000 Document Revised: 03/23/2011 Document Reviewed: 04/05/2013 [employer] Patient Information 2015 [employer]. This information is not intended to replace advice given to you by your health care provider. Make sure you discuss any questions you have with your health care provider.  

## 2014-04-26 NOTE — Progress Notes (Signed)
Patient ID: Taylor Oconnor, female   DOB: 04-Jul-1950, 64 y.o.   MRN: [id number]  HPI  Patient presents to the office for evaluation of cough.  It has been going on for 2 weeks.  Patient reports cough that is worse night > day, dry, worse with lying down.  They also endorse change in voice, chills, postnasal drip, wheezing and rhinorrhea, ear popping, sore throat.  .  They have tried antitussives or antihistamines.  They report that nothing has worked.  They denies other sick contacts.  Seasonal allergies is usually not an issue for her.   Review of Systems  Constitutional: Positive for chills. Negative for fever and malaise/fatigue.  HENT: Positive for congestion, sore throat and tinnitus. Negative for ear discharge, ear pain and nosebleeds.   Eyes: Negative.   Respiratory: Positive for cough and wheezing. Negative for sputum production and shortness of breath.   Cardiovascular: Negative for chest pain and leg swelling.  Skin: Negative.   Neurological: Negative for weakness and headaches.    PE: Filed Vitals:   04/26/14 0939  BP: 128/78  Pulse: 72  Temp: 98.1 F (36.7 C)  Resp: 16     General:  Alert and non-toxic, WDWN, NAD HEENT: NCAT, PERLA, EOM normal, no occular discharge or erythema.  Nasal mucosal edema with sinus tenderness to palpation.  Oropharynx clear with minimal oropharyngeal edema and erythema.  Mucous membranes moist and pink. Neck:  Cervical adenopathy Chest:  RRR no MRGs.  Lungs clear to auscultation A&P with no wheezes rhonchi or rales.   Abdomen: +BS x 4 quadrants, soft, non-tender, no guarding, rigidity, or rebound. Skin: warm and dry no rash Neuro: A&Ox4, CN II-XII grossly intact  Assessment and Plan:   1. Acute URI Likely viral vs. Allergic.  Hold zpak 3 days if no better than will do zpak.    - Phenyleph-Promethazine-Cod 5-6.25-10 MG/5ML SYRP; Take 5 mLs by mouth 3 (three) times daily as needed (severe).  Dispense: 180 mL; Refill: 0 - azithromycin  (ZITHROMAX Z-PAK) 250 MG tablet; 2 po day one, then 1 daily x 4 days  Dispense: 5 tablet; Refill: 0 - benzonatate (TESSALON PERLES) 100 MG capsule; Take 1 capsule (100 mg total) by mouth 3 (three) times daily as needed for cough.  Dispense: 30 capsule; Refill: 1 - mometasone (NASONEX) 50 MCG/ACT nasal spray; Place 2 sprays into the nose daily.  Dispense: 17 g; Refill: 2 - dexamethasone (DECADRON) injection 10 mg; Inject 1 mL (10 mg total) into the muscle once.

## 2014-05-09 ENCOUNTER — Encounter: Payer: Self-pay | Admitting: Internal Medicine

## 2014-07-09 ENCOUNTER — Encounter: Payer: Self-pay | Admitting: *Deleted

## 2014-07-10 ENCOUNTER — Ambulatory Visit (INDEPENDENT_AMBULATORY_CARE_PROVIDER_SITE_OTHER): Payer: 59 | Admitting: Internal Medicine

## 2014-07-10 ENCOUNTER — Encounter: Payer: Self-pay | Admitting: Internal Medicine

## 2014-07-10 VITALS — BP 132/84 | HR 60 | Temp 97.7°F | Resp 16 | Ht 63.5 in | Wt 190.6 lb

## 2014-07-10 DIAGNOSIS — E1129 Type 2 diabetes mellitus with other diabetic kidney complication: Secondary | ICD-10-CM

## 2014-07-10 DIAGNOSIS — Z6833 Body mass index (BMI) 33.0-33.9, adult: Secondary | ICD-10-CM

## 2014-07-10 DIAGNOSIS — Z79899 Other long term (current) drug therapy: Secondary | ICD-10-CM

## 2014-07-10 DIAGNOSIS — I10 Essential (primary) hypertension: Secondary | ICD-10-CM

## 2014-07-10 DIAGNOSIS — E559 Vitamin D deficiency, unspecified: Secondary | ICD-10-CM

## 2014-07-10 DIAGNOSIS — E782 Mixed hyperlipidemia: Secondary | ICD-10-CM

## 2014-07-10 LAB — CBC WITH DIFFERENTIAL/PLATELET
BASOS ABS: 0.1 10*3/uL (ref 0.0–0.1)
Basophils Relative: 1 % (ref 0–1)
EOS PCT: 2 % (ref 0–5)
Eosinophils Absolute: 0.1 10*3/uL (ref 0.0–0.7)
HCT: 38.6 % (ref 36.0–46.0)
Hemoglobin: 13.2 g/dL (ref 12.0–15.0)
Lymphocytes Relative: 38 % (ref 12–46)
Lymphs Abs: 2 10*3/uL (ref 0.7–4.0)
MCH: 30.6 pg (ref 26.0–34.0)
MCHC: 34.2 g/dL (ref 30.0–36.0)
MCV: 89.6 fL (ref 78.0–100.0)
MPV: 8.4 fL — ABNORMAL LOW (ref 8.6–12.4)
Monocytes Absolute: 0.4 10*3/uL (ref 0.1–1.0)
Monocytes Relative: 7 % (ref 3–12)
Neutro Abs: 2.8 10*3/uL (ref 1.7–7.7)
Neutrophils Relative %: 52 % (ref 43–77)
PLATELETS: 440 10*3/uL — AB (ref 150–400)
RBC: 4.31 MIL/uL (ref 3.87–5.11)
RDW: 13.4 % (ref 11.5–15.5)
WBC: 5.3 10*3/uL (ref 4.0–10.5)

## 2014-07-10 LAB — BASIC METABOLIC PANEL WITH GFR
BUN: 19 mg/dL (ref 6–23)
CO2: 30 mEq/L (ref 19–32)
Calcium: 9.6 mg/dL (ref 8.4–10.5)
Chloride: 102 mEq/L (ref 96–112)
Creat: 0.84 mg/dL (ref 0.50–1.10)
GFR, Est African American: 86 mL/min
GFR, Est Non African American: 75 mL/min
GLUCOSE: 123 mg/dL — AB (ref 70–99)
POTASSIUM: 4.6 meq/L (ref 3.5–5.3)
Sodium: 142 mEq/L (ref 135–145)

## 2014-07-10 LAB — HEPATIC FUNCTION PANEL
ALT: 17 U/L (ref 0–35)
AST: 18 U/L (ref 0–37)
Albumin: 4.2 g/dL (ref 3.5–5.2)
Alkaline Phosphatase: 32 U/L — ABNORMAL LOW (ref 39–117)
Bilirubin, Direct: 0.1 mg/dL (ref 0.0–0.3)
Indirect Bilirubin: 0.2 mg/dL (ref 0.2–1.2)
TOTAL PROTEIN: 6.3 g/dL (ref 6.0–8.3)
Total Bilirubin: 0.3 mg/dL (ref 0.2–1.2)

## 2014-07-10 LAB — LIPID PANEL
CHOLESTEROL: 192 mg/dL (ref 0–200)
HDL: 85 mg/dL (ref >=46)
LDL Cholesterol: 77 mg/dL (ref 0–99)
TRIGLYCERIDES: 149 mg/dL (ref <150)
Total CHOL/HDL Ratio: 2.3 Ratio
VLDL: 30 mg/dL (ref 0–40)

## 2014-07-10 LAB — HEMOGLOBIN A1C
Hgb A1c MFr Bld: 5.9 % — ABNORMAL HIGH (ref <5.7)
Mean Plasma Glucose: 123 mg/dL — ABNORMAL HIGH (ref <117)

## 2014-07-10 LAB — TSH: TSH: 1.82 u[IU]/mL (ref 0.350–4.500)

## 2014-07-10 LAB — MAGNESIUM: MAGNESIUM: 2 mg/dL (ref 1.5–2.5)

## 2014-07-10 MED ORDER — METFORMIN HCL ER 500 MG PO TB24: ORAL_TABLET | ORAL | Status: DC

## 2014-07-10 NOTE — Patient Instructions (Addendum)
Cinnamon 1,000 mg tablet 2 x day for Insulin Sensitivity  [email or web address]  +++++++++++++++++++++++++++++++++++++  Vitamin D goal is between 70-100.   Please make sure that you are taking your Vitamin D as directed.   It is very important as a natural anti-inflammatory   helping hair, skin, and nails, as well as reducing stroke and heart attack risk.   It helps your bones and helps with mood.  It also decreases numerous cancer risks so please take it as directed.   Low Vit D is associated with a 200-300% higher risk for CANCER     Recommend the book "The END of DIETING" by Dr Excell Seltzer   & the book "The END of DIABETES " by Dr Excell Seltzer  At [email or web address] - get book & Audio CD's     Being diabetic has a  300% increased risk for heart attack, stroke, cancer, and alzheimer- type vascular dementia. It is very important that you work harder with diet by avoiding all foods that are white. Avoid white rice (brown & wild rice is OK), white potatoes (sweetpotatoes in moderation is OK), White bread or wheat bread or anything made out of white flour like bagels, donuts, rolls, buns, biscuits, cakes, pastries, cookies, pizza crust, and pasta (made from white flour & egg whites) - vegetarian pasta or spinach or wheat pasta is OK. Multigrain breads like Arnold's or Pepperidge Farm, or multigrain sandwich thins or flatbreads.  Diet, exercise and weight loss can reverse and cure diabetes in the early stages.  Diet, exercise and weight loss is very important in the control and prevention of complications of diabetes which affects every system in your body, ie. Brain - dementia/stroke, eyes - glaucoma/blindness, heart - heart attack/heart failure, kidneys - dialysis, stomach - gastric paralysis, intestines - malabsorption, nerves - severe painful neuritis, circulation - gangrene & loss of a leg(s), and finally cancer and Alzheimers.    I recommend avoid fried & greasy foods,  sweets/candy, white rice  (brown or wild rice or Quinoa is OK), white potatoes (sweet potatoes are OK) - anything made from white flour - bagels, doughnuts, rolls, buns, biscuits,white and wheat breads, pizza crust and traditional pasta made of white flour & egg white(vegetarian pasta or spinach or wheat pasta is OK).  Multi-grain bread is OK - like multi-grain flat bread or sandwich thins. Avoid alcohol in excess. Exercise is also important.    Eat all the vegetables you want - avoid meat, especially red meat and dairy - especially cheese.  Cheese is the most concentrated form of trans-fats which is the worst thing to clog up our arteries. Veggie cheese is OK which can be found in the fresh produce section at [employer] or [employer] or [employer]  ++++++++++++++++++++++++++ 200-300% higher risk for [place].    .....................................Marland Kitchen  It is also associated with higher death rate at younger ages,   autoimmune diseases like Rheumatoid arthritis, Lupus, Multiple Sclerosis.     Also many other serious conditions, like depression, Alzheimer's  Dementia, infertility, muscle aches, fatigue, fibromyalgia - just to name a few.  +++++++++++++++++++

## 2014-07-10 NOTE — Progress Notes (Signed)
Patient ID: Taylor Oconnor, female   DOB: 1950-11-20, 64 y.o.   MRN: [id number]   This very nice 64 y.o.female presents for 3 month follow up with Hypertension, Hyperlipidemia, T2_NIDDM and Vitamin D Deficiency.    Patient is treated for HTN since 1995 & BP has been controlled at home. Today's BP: 132/84 mmHg. Patient has had no complaints of any cardiac type chest pain, palpitations, dyspnea/orthopnea/PND, dizziness, claudication, or dependent edema.   Hyperlipidemia is controlled with diet & meds. Patient denies myalgias or other med SE's. Last Lipids were not at goal -  Chol 210*; HDL 79; LDL  114*; Triglycerides 85 on 04/03/2014.   Also, the patient has history of Morbid Obesity (BMI 33.23) and consequent T2_NIDDM since 2008 and Metformin not started until 2012 and patient has CKD 2 (GFR=65 ml/min)  and has had no symptoms of reactive hypoglycemia, diabetic polys, paresthesias or visual blurring.  Last A1c was  5.9% on  04/03/2014. In Nov 2008 patient underwent Lap Band Gastric Bipass & lost from a peak of 230# down to her current 190# and has plateaued.    Further, the patient also has history of Vitamin D Deficiency of 28 in 2008 and supplements vitamin D without any suspected side-effects. Last vitamin D was  2 on 04/03/2014.  Medication Sig  . acetaminophen  650 MG CR tablet Take every 8  hours as needed prn pain.    Marland Kitchen acidophilus (RISAQUAD) CAPS Take 1 cap daily.  Marland Kitchen ALPRAZolam  1 MG tablet TAKE 1/2 TO 1 TAB 3 TIMES A DAY AS NEEDED  . aspirin 81 MG tablet Take daily.  Marland Kitchen atenolol100 MG tablet TAKE 1 TAB EVERY DAY  . b complex vitamins capsule Take 1 cap daily.  . calcium carbonate (TUMS) 500 MG  Chew 1,500 mg  at bedtime.  Marland Kitchen VITAMIN D 5000 UNITS CAPS Take  daily.  . cyclobenzaprine  10 MG tablet TAKE 1/2 TO 1 TAB 3 TIMES A DAY AS NEEDED   . fenofibrate134 MG capsule TAKE ONE CAP DAILY  . Ferrous Sulfate  325 (65 FE) MG TABS Take  daily.  . hctz 25 MG tablet TAKE 1 TAB EVERY MORNING  .  ANUSOL-HC 2.5 %  crm Place 1 application rectally 4 (four) times daily.   . Magnesium 250 MG TABS Take  2  times daily.  . metFORMIN 500 MG XR TAKE 2 TABTWICE A DAY WITH A MEAL  . NASONEX  nasal spray Place 2 sprays into the nose daily.  . Multiple Vitamins-Minerals  Take 1 tab 2  times daily.  . verapamil (CALAN) 80 MG tablet TAKE 1 TAB TWICE A DAY FOR BLOOD PRESSURE  . fenofibrate 134 MG TAKE ONE CAP  DAILY   Allergies  Allergen Reactions  . Codeine Itching  . Lipitor [Atorvastatin] Other (See Comments)    Change in memory.  Humberto Leep Inhibitors Rash   PMHx:   Past Medical History  Diagnosis Date  . Hypertension   . GERD (gastroesophageal reflux disease)   . H/O hiatal hernia   . 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 At risk for difficult insertion of breathing tube   . Type II or unspecified type diabetes mellitus without mention of complication, not stated as uncontrolled 12/14/2012  . 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) 12/14/2012   Immunization History  Administered Date(s) Administered  . DT 12/18/2013  . DTaP 01/12/2002  . Influenza Split 12/14/2012, 11/19/2013  . PPD Test 12/14/2012, 12/18/2013  .  Pneumococcal Conjugate-13 10/09/2013  . Pneumococcal Polysaccharide-23 01/13/1992   Past Surgical History  Procedure Laterality Date  . Cholecystectomy    . Diagnostic laparoscopy    . Hernia repair    . Tubal ligation    . Back surgery    . Laparoscopic appendectomy N/A 08/06/2012    Procedure: APPENDECTOMY LAPAROSCOPIC;  Surgeon: Earnstine Regal, MD;  Location: [facility];  Service: General;  Laterality: N/A;  . Appendectomy  08/06/12    lap appy  . Laparoscopic gastric banding with hiatal hernia repair N/A Nov 2008   FHx:    Reviewed / unchanged  SHx:    Reviewed / unchanged  Systems Review:  Constitutional: Denies fever, chills, wt changes, headaches, insomnia, fatigue, night sweats, change in appetite. Eyes: Denies redness, blurred vision, diplopia,  discharge, itchy, watery eyes.  ENT: Denies discharge, congestion, post nasal drip, epistaxis, sore throat, earache, hearing loss, dental pain, tinnitus, vertigo, sinus pain, snoring.  CV: Denies chest pain, palpitations, irregular heartbeat, syncope, dyspnea, diaphoresis, orthopnea, PND, claudication or edema. Respiratory: denies cough, dyspnea, DOE, pleurisy, hoarseness, laryngitis, wheezing.  Gastrointestinal: Denies dysphagia, odynophagia, heartburn, reflux, water brash, abdominal pain or cramps, nausea, vomiting, bloating, diarrhea, constipation, hematemesis, melena, hematochezia  or hemorrhoids. Genitourinary: Denies dysuria, frequency, urgency, nocturia, hesitancy, discharge, hematuria or flank pain. Musculoskeletal: Denies arthralgias, myalgias, stiffness, jt. swelling, pain, limping or strain/sprain.  Skin: Denies pruritus, rash, hives, warts, acne, eczema or change in skin lesion(s). Neuro: No weakness, tremor, incoordination, spasms, paresthesia or pain. Psychiatric: Denies confusion, memory loss or sensory loss. Endo: Denies change in weight, skin or hair change.  Heme/Lymph: No excessive bleeding, bruising or enlarged lymph nodes.  Physical Exam  BP 132/84   Pulse 60  Temp 97.7 F   Resp 16  Ht 5' 3.5"   Wt 190 lb 9.6 oz     BMI 33.23  Appears well nourished and in no distress. Eyes: PERRLA, EOMs, conjunctiva no swelling or erythema. Sinuses: No frontal/maxillary tenderness ENT/Mouth: EAC's clear, TM's nl w/o erythema, bulging. Nares clear w/o erythema, swelling, exudates. Oropharynx clear without erythema or exudates. Oral hygiene is good. Tongue normal, non obstructing. Hearing intact.  Neck: Supple. Thyroid nl. Car 2+/2+ without bruits, nodes or JVD. Chest: Respirations nl with BS clear & equal w/o rales, rhonchi, wheezing or stridor.  Cor: Heart sounds normal w/ regular rate and rhythm without sig. murmurs, gallops, clicks, or rubs. Peripheral pulses normal and equal   without edema.  Abdomen: Soft & bowel sounds normal. Non-tender w/o guarding, rebound, hernias, masses, or organomegaly.  Lymphatics: Unremarkable.  Musculoskeletal: Full ROM all peripheral extremities, joint stability, 5/5 strength, and normal gait.  Skin: Warm, dry without exposed rashes, lesions or ecchymosis apparent.  Neuro: Cranial nerves intact, reflexes equal bilaterally. Sensory-motor testing grossly intact. Tendon reflexes grossly intact.  Pysch: Alert & oriented x 3.  Insight and judgement nl & appropriate. No ideations.  Assessment  1. Essential hypertension  - TSH  2. Mixed hyperlipidemia  - Lipid panel  3. Type 2 diabetes mellitus with other diabetic kidney complication  - Hemoglobin A1c - Insulin, random - metFORMIN (GLUCOPHAGE XR) 500 MG 24 hr tablet; Take 2 tablets 2 x daily for Diabetes  Dispense: 360 tablet; Refill: 3  4. Vitamin D deficiency  - Vit D  25 hydroxy   5. Morbid obesity   6. Medication management  - CBC with Differential/Platelet - BASIC METABOLIC PANEL WITH GFR - Hepatic function panel - Magnesium  7. BMI 33.0-33.9,adult  Recommended regular exercise, BP monitoring, weight control, and discussed med and SE's. Recommended labs to assess and monitor clinical status. Further disposition pending results of labs. Over 30 minutes of exam, counseling, chart review was performed

## 2014-07-11 LAB — INSULIN, RANDOM: INSULIN: 79 u[IU]/mL — AB (ref 2.0–19.6)

## 2014-07-11 LAB — VITAMIN D 25 HYDROXY (VIT D DEFICIENCY, FRACTURES): Vit D, 25-Hydroxy: 67 ng/mL (ref 30–100)

## 2014-07-15 ENCOUNTER — Other Ambulatory Visit: Payer: Self-pay | Admitting: Internal Medicine

## 2014-08-02 ENCOUNTER — Encounter: Payer: Self-pay | Admitting: Gastroenterology

## 2014-08-08 ENCOUNTER — Other Ambulatory Visit: Payer: Self-pay | Admitting: Internal Medicine

## 2014-08-14 ENCOUNTER — Encounter: Payer: Self-pay | Admitting: Gastroenterology

## 2014-09-15 ENCOUNTER — Other Ambulatory Visit: Payer: Self-pay | Admitting: Internal Medicine

## 2014-09-25 ENCOUNTER — Other Ambulatory Visit: Payer: Self-pay | Admitting: Physician Assistant

## 2014-09-25 DIAGNOSIS — F411 Generalized anxiety disorder: Secondary | ICD-10-CM

## 2014-10-04 ENCOUNTER — Other Ambulatory Visit: Payer: Self-pay | Admitting: Physician Assistant

## 2014-10-08 ENCOUNTER — Ambulatory Visit (AMBULATORY_SURGERY_CENTER): Payer: Self-pay | Admitting: *Deleted

## 2014-10-08 VITALS — Ht 63.5 in | Wt 178.6 lb

## 2014-10-08 DIAGNOSIS — Z1211 Encounter for screening for malignant neoplasm of colon: Secondary | ICD-10-CM

## 2014-10-08 MED ORDER — SUPREP BOWEL PREP KIT 17.5-3.13-1.6 GM/177ML PO SOLN: 1.0000 | Freq: Once | ORAL | Status: DC

## 2014-10-08 NOTE — Progress Notes (Signed)
Patient denies any allergies to egg or soy products. Patient has difficult intubation r/t large tonsils, otherwise no problems with anesthesia/sedation.  Patient denies oxygen use at home and denies diet medications. Emmi instructions for colonoscopy explained and given to patient.

## 2014-10-15 ENCOUNTER — Encounter: Payer: Self-pay | Admitting: Physician Assistant

## 2014-10-15 ENCOUNTER — Other Ambulatory Visit: Payer: Self-pay

## 2014-10-15 ENCOUNTER — Ambulatory Visit (INDEPENDENT_AMBULATORY_CARE_PROVIDER_SITE_OTHER): Payer: 59 | Admitting: Physician Assistant

## 2014-10-15 VITALS — BP 132/74 | HR 76 | Temp 97.7°F | Resp 14 | Ht 63.5 in | Wt 179.0 lb

## 2014-10-15 DIAGNOSIS — I10 Essential (primary) hypertension: Secondary | ICD-10-CM | POA: Diagnosis not present

## 2014-10-15 DIAGNOSIS — E559 Vitamin D deficiency, unspecified: Secondary | ICD-10-CM

## 2014-10-15 DIAGNOSIS — E782 Mixed hyperlipidemia: Secondary | ICD-10-CM

## 2014-10-15 DIAGNOSIS — Z79899 Other long term (current) drug therapy: Secondary | ICD-10-CM

## 2014-10-15 DIAGNOSIS — E1129 Type 2 diabetes mellitus with other diabetic kidney complication: Secondary | ICD-10-CM

## 2014-10-15 DIAGNOSIS — Z1231 Encounter for screening mammogram for malignant neoplasm of breast: Secondary | ICD-10-CM

## 2014-10-15 DIAGNOSIS — Z23 Encounter for immunization: Secondary | ICD-10-CM | POA: Diagnosis not present

## 2014-10-15 LAB — CBC WITH DIFFERENTIAL/PLATELET
BASOS ABS: 0 10*3/uL (ref 0.0–0.1)
BASOS PCT: 0 % (ref 0–1)
EOS ABS: 0.1 10*3/uL (ref 0.0–0.7)
Eosinophils Relative: 2 % (ref 0–5)
HCT: 38.8 % (ref 36.0–46.0)
Hemoglobin: 13 g/dL (ref 12.0–15.0)
LYMPHS ABS: 2.7 10*3/uL (ref 0.7–4.0)
Lymphocytes Relative: 45 % (ref 12–46)
MCH: 29.4 pg (ref 26.0–34.0)
MCHC: 33.5 g/dL (ref 30.0–36.0)
MCV: 87.8 fL (ref 78.0–100.0)
MPV: 8.6 fL (ref 8.6–12.4)
Monocytes Absolute: 0.5 10*3/uL (ref 0.1–1.0)
Monocytes Relative: 8 % (ref 3–12)
NEUTROS PCT: 45 % (ref 43–77)
Neutro Abs: 2.7 10*3/uL (ref 1.7–7.7)
PLATELETS: 494 10*3/uL — AB (ref 150–400)
RBC: 4.42 MIL/uL (ref 3.87–5.11)
RDW: 13.5 % (ref 11.5–15.5)
WBC: 5.9 10*3/uL (ref 4.0–10.5)

## 2014-10-15 LAB — HEMOGLOBIN A1C
HEMOGLOBIN A1C: 6.2 % — AB (ref <5.7)
Mean Plasma Glucose: 131 mg/dL — ABNORMAL HIGH (ref <117)

## 2014-10-15 MED ORDER — GLUCOSE BLOOD VI STRP: ORAL_STRIP | Status: DC

## 2014-10-15 NOTE — Patient Instructions (Addendum)
Can try to stop your fluid pill and see if this helps your kidneys, if you start to get swelling, try to take for a few days then stop, or do every other day  Increase fluids, was 80-100 oz  Stop iron pill and will recheck it next visit.   Diabetes is a very complicated disease...lets simplify it.  An easy way to look at it to understand the complications is if you think of the extra sugar floating in your blood stream as glass shards floating through your blood stream.    Diabetes affects your small vessels first: 1) The glass shards (sugar) scraps down the tiny blood vessels in your eyes and lead to diabetic retinopathy, the leading cause of blindness in the [place]. Diabetes is the leading cause of newly diagnosed adult (28 to 63 years of age) blindness in the [place].  2) The glass shards scratches down the tiny vessels of your legs leading to nerve damage called neuropathy and can lead to amputations of your feet. More than 60% of all non-traumatic amputations of lower limbs occur in people with diabetes.  3) Over time the small vessels in your brain are shredded and closed off, individually this does not cause any problems but over a long period of time many of the small vessels being blocked can lead to Vascular Dementia.   4) Your kidney's are a filter system and have a "net" that keeps certain things in the body and lets bad things out. Sugar shreds this net and leads to kidney damage and eventually failure. Decreasing the sugar that is destroying the net and certain blood pressure medications can help stop or decrease progression of kidney disease. Diabetes was the primary cause of kidney failure in 44 percent of all new cases in 2011.  5) Diabetes also destroys the small vessels in your penis that lead to erectile dysfunction. Eventually the vessels are so damaged that you may not be responsive to cialis or viagra.   Diabetes and your large vessels: Your larger vessels consist of  your coronary arteries in your heart and the carotid vessels to your brain. Diabetes or even increased sugars put you at 300% increased risk of heart attack and stroke and this is why.. The sugar scrapes down your large blood vessels and your body sees this as an internal injury and tries to repair itself. Just like you get a scab on your skin, your platelets will stick to the blood vessel wall trying to heal it. This is why we have diabetics on low dose aspirin daily, this prevents the platelets from sticking and can prevent plaque formation. In addition, your body takes cholesterol and tries to shove it into the open wound. This is why we want your LDL, or bad cholesterol, below 70.   The combination of platelets and cholesterol over 5-10 years forms plaque that can break off and cause a heart attack or stroke.   PLEASE REMEMBER:  Diabetes is preventable! Up to 68 percent of complications and morbidities among individuals with type 2 diabetes can be prevented, delayed, or effectively treated and minimized with regular visits to a health professional, appropriate monitoring and medication, and a healthy diet and lifestyle.  We are starting you on Metformin to prevent or treat diabetes. Metformin does not cause low blood sugars. In order to create energy your cells need insulin and sugar but sometime your cells do not accept the insulin and this can cause increased sugars and decreased energy. The  Metformin helps your cells accept insulin and the sugar to give you more energy.   The two most common side effects are nausea and diarrhea, follow these rules to avoid it! You can take imodium per box instructions when starting metformin if needed.   Rules of metformin: 1) start out slow with only one pill daily. Our goal for you is 4 pills a day or 2000mg  total.  2) take with your largest meal. 3) Take with least amount of carbs.   Call if you have any problems.    Kidney function is abnormal but  stable. The GFR looks at the filter rate of your kidney and we monitor this closely however it is a very very fickle lab so many things can change it especially how much fluid you drink. Also with your age this number will naturally decrease which is normal.  Please avoid NSAIDS like aleve, ibuprofen, etc. Decrease sugars/carbs. Increase fluids. Will continue to monitor, if at any time there is a big change or I am concerned, I will let you know. Normal is above 90.   Lab Results  Component Value Date   GFRNONAA 75 07/10/2014

## 2014-10-15 NOTE — Progress Notes (Signed)
Assessment and Plan:  1. Hypertension -Continue medication, monitor blood pressure at home. Continue DASH diet.  Reminder to go to the ER if any CP, SOB, nausea, dizziness, severe HA, changes vision/speech, left arm numbness and tingling and jaw pain.  2. Cholesterol -Continue diet and exercise. Check cholesterol.   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 Check A1C  4. Vitamin D Def - check level and continue medications.   5. Obesity with co morbidities-  long discussion about weight loss, diet, and exercise  6. Nodule/flap of skin on anus, 6 oclock.  Has seen Dr. Hassell Done will not remove it, has been irritated occ due to location and would like it removed. She has appointment next week with GI for removal.   Continue diet and meds as discussed. Further disposition pending results of labs. Discussed med's effects and SE's.   Over 30 minutes of exam, counseling, chart review, and critical decision making was performed  Left before getting flu shot, will get [facility] or come back  HPI 64 y.o. female  presents for 3 month follow up on hypertension, cholesterol, diabetes and vitamin D deficiency.   Her blood pressure has been controlled at home, today her BP is BP: 132/74 mmHg.  She does workout. She denies chest pain, shortness of breath, dizziness.  She is not on cholesterol medication, she is on fenofibrate and denies myalgias. Her cholesterol is at goal. The cholesterol was:   Lab Results  Component Value Date   CHOL 192 07/10/2014   HDL 85 07/10/2014   LDLCALC 77 07/10/2014   TRIG 149 07/10/2014   CHOLHDL 2.3 07/10/2014   She has been working on diet and exercise for diabetes with diabetic chronic kidney disease, she is on bASA, she is not on ACE/ARB due to an allergy, she is on MF 4 a day, and denies  paresthesia of the feet, polydipsia, polyuria and visual disturbances. Last A1C was:  Lab Results  Component Value Date   HGBA1C 5.9*  07/10/2014  Patient is on Vitamin D supplement. Lab Results  Component Value Date   VD25OH 67 07/10/2014  BMI is Body mass index is 31.21 kg/(m^2)., she is working on diet and exercise, she had lap band surgery in 2008 and weight is down from 229.She is unable to tolerate phentermine.  Wt Readings from Last 3 Encounters:  10/15/14 179 lb (81.194 kg)  10/08/14 178 lb 9.6 oz (81.012 kg)  07/10/14 190 lb 9.6 oz (86.456 kg)    Current Medications:  Current Outpatient Prescriptions on File Prior to Visit  Medication Sig Dispense Refill  . acidophilus (RISAQUAD) CAPS Take 1 capsule by mouth daily.    Marland Kitchen ALPRAZolam (XANAX) 1 MG tablet TAKE 1/2 TO 1 TABLET 3 TIMES A DAY AS NEEDED 90 tablet 5  . aspirin 81 MG tablet Take 81 mg by mouth daily.    Marland Kitchen atenolol (TENORMIN) 100 MG tablet TAKE 1 TABLET EVERY DAY 90 tablet 6  . b complex vitamins capsule Take 1 capsule by mouth daily.    . calcium carbonate (TUMS) 500 MG chewable tablet Chew 1,500 mg by mouth at bedtime.    . Cholecalciferol (VITAMIN D3) 5000 UNITS CAPS Take 5,000 Units by mouth daily.    . Cinnamon 500 MG TABS Take 1 tablet by mouth 2 (two) times daily. Cinnamon 1000 mg, 1 tab in morning and 1 tab evening    . fenofibrate micronized (LOFIBRA) 134 MG capsule TAKE ONE CAPSULE BY MOUTH  DAILY 30 capsule 5  . Ferrous Sulfate (IRON) 325 (65 FE) MG TABS Take 325 mg by mouth daily.    Marland Kitchen FREESTYLE LITE test strip CHECK GLUCOSE 3 TIMES A DAY BEFORE MEALS 100 each 0  . hydrochlorothiazide (HYDRODIURIL) 25 MG tablet TAKE 1 TABLET EVERY MORNING FOR BLOOD PRESSURE AND FLUID 90 tablet 3  . hydrochlorothiazide (HYDRODIURIL) 25 MG tablet TAKE 1 TABLET EVERY MORNING FOR BLOOD PRESSURE AND FLUID 90 tablet 1  . Magnesium 250 MG TABS Take 250 mg by mouth 2 (two) times daily.    . metFORMIN (GLUCOPHAGE XR) 500 MG 24 hr tablet Take 2 tablets 2 x daily for Diabetes 360 tablet 3  . Multiple Vitamins-Minerals (MULTIVITAMIN WITH MINERALS) tablet Take 1 tablet by  mouth 2 (two) times daily.    . Omega-3 Fatty Acids (FISH OIL) 1000 MG CAPS Take 1,000 mg by mouth daily.    Marland Kitchen OVER THE COUNTER MEDICATION Take 1 tablet by mouth daily. Potassium 595 mg tablet daily    . OVER THE COUNTER MEDICATION Take 1 tablet by mouth 2 (two) times daily. Tumeric with black pepper    . SUPREP BOWEL PREP SOLN Take 1 kit by mouth once. Brand Name Only.  No Substitutions.  Suprep Bowel Kit. 177 mL 0  . verapamil (CALAN) 80 MG tablet TAKE 1 TABLET BY MOUTH TWICE A DAY FOR BLOOD PRESSURE 180 tablet 3  . hydrocortisone (ANUSOL-HC) 2.5 % rectal cream Place 1 application rectally 4 (four) times daily. (Patient taking differently: Place 1 application rectally 4 (four) times daily. prn) 60 g 3   No current facility-administered medications on file prior to visit.   Medical History:  Past Medical History  Diagnosis Date  . Hypertension   . H/O hiatal hernia     resolved with band surgery  . 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 At risk for difficult insertion of breathing tube   . Type II or unspecified type diabetes mellitus without mention of complication, not stated as uncontrolled 12/14/2012  . 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 Insomnia     tx with zanax  . Anemia     Hx - no current problems  . Anxiety   . GERD (gastroesophageal reflux disease)     hx - resolved with lap band surgery  . Hyperlipidemia    Allergies:  Allergies  Allergen Reactions  . Codeine Itching  . Lipitor [Atorvastatin] Other (See Comments)    Severe fatigue. ? memory loss.  . Ace Inhibitors Rash    Review of Systems:  Review of Systems  Constitutional: Negative.   HENT: Negative.   Eyes: Negative.   Respiratory: Negative.   Cardiovascular: Negative.   Gastrointestinal: Positive for diarrhea. Negative for heartburn, nausea, vomiting, abdominal pain, constipation, blood in stool and melena.  Genitourinary: Negative.   Musculoskeletal: Negative.   Skin: Negative.    Neurological: Negative.   Endo/Heme/Allergies: Negative.   Psychiatric/Behavioral: Negative.     Family history- Review and unchanged Social history- Review and unchanged Physical Exam: BP 132/74 mmHg  Pulse 76  Temp(Src) 97.7 F (36.5 C) (Temporal)  Resp 14  Ht 5' 3.5" (1.613 m)  Wt 179 lb (81.194 kg)  BMI 31.21 kg/m2  SpO2 98% Wt Readings from Last 3 Encounters:  10/15/14 179 lb (81.194 kg)  10/08/14 178 lb 9.6 oz (81.012 kg)  07/10/14 190 lb 9.6 oz (86.456 kg)   General Appearance: Well nourished, in no apparent distress. Eyes: PERRLA,  EOMs, conjunctiva no swelling or erythema Sinuses: No Frontal/maxillary tenderness ENT/Mouth: Ext aud canals clear, TMs without erythema, bulging. No erythema, swelling, or exudate on post pharynx.  Tonsils not swollen or erythematous. Hearing normal.  Neck: Supple, thyroid normal.  Respiratory: Respiratory effort normal, BS equal bilaterally without rales, rhonchi, wheezing or stridor.  Cardio: RRR with no MRGs. Brisk peripheral pulses without edema.  Abdomen: Soft, + BS.  Non tender, no guarding, rebound, hernias, masses. Lymphatics: Non tender without lymphadenopathy.  Musculoskeletal: Full ROM, 5/5 strength, Normal gait Anus: at 6 o clock has nodule/flap that is not erythematous, tender.  Skin: Warm, dry without rashes, lesions, ecchymosis.  Neuro: Cranial nerves intact. No cerebellar symptoms.  Psych: Awake and oriented X 3, normal affect, Insight and Judgment appropriate.    Vicie Mutters, PA-C 4:55 PM [facility]

## 2014-10-16 LAB — MAGNESIUM: Magnesium: 2 mg/dL (ref 1.5–2.5)

## 2014-10-16 LAB — HEPATIC FUNCTION PANEL
ALK PHOS: 30 U/L — AB (ref 33–130)
ALT: 169 U/L — ABNORMAL HIGH (ref 6–29)
AST: 127 U/L — AB (ref 10–35)
Albumin: 4.3 g/dL (ref 3.6–5.1)
BILIRUBIN DIRECT: 0.1 mg/dL (ref <=0.2)
BILIRUBIN INDIRECT: 0.3 mg/dL (ref 0.2–1.2)
BILIRUBIN TOTAL: 0.4 mg/dL (ref 0.2–1.2)
Total Protein: 6.2 g/dL (ref 6.1–8.1)

## 2014-10-16 LAB — BASIC METABOLIC PANEL WITH GFR
BUN: 14 mg/dL (ref 7–25)
CHLORIDE: 101 mmol/L (ref 98–110)
CO2: 26 mmol/L (ref 20–31)
Calcium: 9.3 mg/dL (ref 8.6–10.4)
Creat: 0.77 mg/dL (ref 0.50–0.99)
GFR, EST NON AFRICAN AMERICAN: 82 mL/min (ref >=60)
Glucose, Bld: 90 mg/dL (ref 65–99)
POTASSIUM: 3.9 mmol/L (ref 3.5–5.3)
SODIUM: 140 mmol/L (ref 135–146)

## 2014-10-16 LAB — LIPID PANEL
CHOL/HDL RATIO: 2.6 ratio (ref <=5.0)
Cholesterol: 185 mg/dL (ref 125–200)
HDL: 70 mg/dL (ref >=46)
LDL Cholesterol: 99 mg/dL (ref <130)
Triglycerides: 79 mg/dL (ref <150)
VLDL: 16 mg/dL (ref <30)

## 2014-10-16 LAB — VITAMIN D 25 HYDROXY (VIT D DEFICIENCY, FRACTURES): VIT D 25 HYDROXY: 74 ng/mL (ref 30–100)

## 2014-10-16 LAB — TSH: TSH: 2.067 u[IU]/mL (ref 0.350–4.500)

## 2014-10-17 ENCOUNTER — Encounter: Payer: Self-pay | Admitting: Physician Assistant

## 2014-10-22 ENCOUNTER — Encounter: Payer: Self-pay | Admitting: Gastroenterology

## 2014-10-29 ENCOUNTER — Other Ambulatory Visit: Payer: 59

## 2014-10-29 DIAGNOSIS — R740 Nonspecific elevation of levels of transaminase and lactic acid dehydrogenase [LDH]: Secondary | ICD-10-CM

## 2014-10-29 DIAGNOSIS — R7989 Other specified abnormal findings of blood chemistry: Secondary | ICD-10-CM

## 2014-10-29 DIAGNOSIS — R7401 Elevation of levels of liver transaminase levels: Secondary | ICD-10-CM

## 2014-10-29 DIAGNOSIS — R945 Abnormal results of liver function studies: Principal | ICD-10-CM

## 2014-10-30 LAB — HEPATIC FUNCTION PANEL
ALT: 153 U/L — ABNORMAL HIGH (ref 6–29)
AST: 86 U/L — ABNORMAL HIGH (ref 10–35)
Albumin: 3.8 g/dL (ref 3.6–5.1)
Alkaline Phosphatase: 37 U/L (ref 33–130)
BILIRUBIN DIRECT: 0.1 mg/dL (ref <=0.2)
BILIRUBIN TOTAL: 0.4 mg/dL (ref 0.2–1.2)
Indirect Bilirubin: 0.3 mg/dL (ref 0.2–1.2)
Total Protein: 6.3 g/dL (ref 6.1–8.1)

## 2014-11-15 ENCOUNTER — Ambulatory Visit
Admission: RE | Admit: 2014-11-15 | Discharge: 2014-11-15 | Disposition: A | Discharge status: Home / Self Care | Payer: 59 | Source: Ambulatory Visit

## 2014-11-15 DIAGNOSIS — Z1231 Encounter for screening mammogram for malignant neoplasm of breast: Secondary | ICD-10-CM

## 2014-11-16 ENCOUNTER — Other Ambulatory Visit: Payer: Self-pay | Admitting: Internal Medicine

## 2014-11-21 ENCOUNTER — Other Ambulatory Visit: Payer: Self-pay | Admitting: Internal Medicine

## 2014-11-21 DIAGNOSIS — R928 Other abnormal and inconclusive findings on diagnostic imaging of breast: Secondary | ICD-10-CM

## 2014-11-27 ENCOUNTER — Other Ambulatory Visit: Payer: Self-pay | Admitting: Internal Medicine

## 2014-11-27 ENCOUNTER — Ambulatory Visit
Admission: RE | Admit: 2014-11-27 | Discharge: 2014-11-27 | Disposition: A | Discharge status: Home / Self Care | Payer: 59 | Source: Ambulatory Visit | Attending: Internal Medicine | Admitting: Internal Medicine

## 2014-11-27 DIAGNOSIS — R928 Other abnormal and inconclusive findings on diagnostic imaging of breast: Secondary | ICD-10-CM

## 2014-11-27 DIAGNOSIS — N6310 Unspecified lump in the right breast, unspecified quadrant: Secondary | ICD-10-CM

## 2014-11-28 ENCOUNTER — Ambulatory Visit
Admission: RE | Admit: 2014-11-28 | Discharge: 2014-11-28 | Disposition: A | Discharge status: Home / Self Care | Payer: 59 | Source: Ambulatory Visit | Attending: Internal Medicine | Admitting: Internal Medicine

## 2014-11-28 ENCOUNTER — Other Ambulatory Visit: Payer: Self-pay | Admitting: Internal Medicine

## 2014-11-28 DIAGNOSIS — N6310 Unspecified lump in the right breast, unspecified quadrant: Secondary | ICD-10-CM

## 2014-11-28 DIAGNOSIS — R928 Other abnormal and inconclusive findings on diagnostic imaging of breast: Secondary | ICD-10-CM

## 2014-11-29 ENCOUNTER — Ambulatory Visit
Admission: RE | Admit: 2014-11-29 | Discharge: 2014-11-29 | Disposition: A | Discharge status: Home / Self Care | Payer: 59 | Source: Ambulatory Visit | Attending: Internal Medicine | Admitting: Internal Medicine

## 2014-11-29 DIAGNOSIS — N6310 Unspecified lump in the right breast, unspecified quadrant: Secondary | ICD-10-CM

## 2014-12-01 ENCOUNTER — Other Ambulatory Visit: Payer: Self-pay | Admitting: Internal Medicine

## 2014-12-01 ENCOUNTER — Other Ambulatory Visit: Payer: Self-pay | Admitting: Physician Assistant

## 2014-12-03 ENCOUNTER — Telehealth: Payer: Self-pay | Admitting: *Deleted

## 2014-12-03 ENCOUNTER — Encounter: Payer: Self-pay | Admitting: *Deleted

## 2014-12-03 DIAGNOSIS — C50411 Malignant neoplasm of upper-outer quadrant of right female breast: Secondary | ICD-10-CM

## 2014-12-03 DIAGNOSIS — Z170 Estrogen receptor positive status [ER+]: Secondary | ICD-10-CM

## 2014-12-03 HISTORY — DX: Malignant neoplasm of upper-outer quadrant of right female breast: C50.411

## 2014-12-03 NOTE — Telephone Encounter (Signed)
Confirmed [facility] for 12/12/14 at 830am .  Instructions and contact information given.

## 2014-12-05 ENCOUNTER — Other Ambulatory Visit: Payer: Self-pay | Admitting: Internal Medicine

## 2014-12-12 ENCOUNTER — Ambulatory Visit (HOSPITAL_BASED_OUTPATIENT_CLINIC_OR_DEPARTMENT_OTHER): Payer: 59 | Admitting: Oncology

## 2014-12-12 ENCOUNTER — Other Ambulatory Visit (HOSPITAL_BASED_OUTPATIENT_CLINIC_OR_DEPARTMENT_OTHER): Payer: 59

## 2014-12-12 ENCOUNTER — Encounter: Payer: Self-pay | Admitting: Skilled Nursing Facility1

## 2014-12-12 ENCOUNTER — Other Ambulatory Visit: Payer: Self-pay | Admitting: Surgery

## 2014-12-12 ENCOUNTER — Encounter: Payer: Self-pay | Admitting: Physical Therapy

## 2014-12-12 ENCOUNTER — Encounter: Payer: Self-pay | Admitting: *Deleted

## 2014-12-12 ENCOUNTER — Ambulatory Visit
Admission: RE | Admit: 2014-12-12 | Discharge: 2014-12-12 | Disposition: A | Discharge status: Home / Self Care | Payer: 59 | Source: Ambulatory Visit | Attending: Radiation Oncology | Admitting: Radiation Oncology

## 2014-12-12 ENCOUNTER — Encounter: Payer: Self-pay | Admitting: Oncology

## 2014-12-12 ENCOUNTER — Encounter: Payer: Self-pay | Admitting: Nurse Practitioner

## 2014-12-12 ENCOUNTER — Ambulatory Visit: Payer: 59 | Attending: Surgery | Admitting: Physical Therapy

## 2014-12-12 VITALS — BP 129/62 | HR 60 | Temp 97.8°F | Resp 18 | Ht 63.5 in | Wt 185.7 lb

## 2014-12-12 DIAGNOSIS — C50411 Malignant neoplasm of upper-outer quadrant of right female breast: Secondary | ICD-10-CM

## 2014-12-12 DIAGNOSIS — R293 Abnormal posture: Secondary | ICD-10-CM | POA: Diagnosis present

## 2014-12-12 DIAGNOSIS — Z170 Estrogen receptor positive status [ER+]: Secondary | ICD-10-CM | POA: Diagnosis not present

## 2014-12-12 DIAGNOSIS — C50911 Malignant neoplasm of unspecified site of right female breast: Secondary | ICD-10-CM

## 2014-12-12 DIAGNOSIS — M8580 Other specified disorders of bone density and structure, unspecified site: Secondary | ICD-10-CM

## 2014-12-12 LAB — COMPREHENSIVE METABOLIC PANEL (CC13)
ALT: 75 U/L — AB (ref 0–55)
AST: 52 U/L — AB (ref 5–34)
Albumin: 3.8 g/dL (ref 3.5–5.0)
Alkaline Phosphatase: 41 U/L (ref 40–150)
Anion Gap: 8 mEq/L (ref 3–11)
BILIRUBIN TOTAL: 0.49 mg/dL (ref 0.20–1.20)
BUN: 16.3 mg/dL (ref 7.0–26.0)
CO2: 27 meq/L (ref 22–29)
CREATININE: 0.9 mg/dL (ref 0.6–1.1)
Calcium: 9.5 mg/dL (ref 8.4–10.4)
Chloride: 107 mEq/L (ref 98–109)
EGFR: 65 mL/min/{1.73_m2} — ABNORMAL LOW (ref >90)
GLUCOSE: 91 mg/dL (ref 70–140)
Potassium: 4.2 mEq/L (ref 3.5–5.1)
SODIUM: 143 meq/L (ref 136–145)
TOTAL PROTEIN: 6.8 g/dL (ref 6.4–8.3)

## 2014-12-12 LAB — CBC WITH DIFFERENTIAL/PLATELET
BASO%: 0.5 % (ref 0.0–2.0)
Basophils Absolute: 0 10*3/uL (ref 0.0–0.1)
EOS%: 1.6 % (ref 0.0–7.0)
Eosinophils Absolute: 0.1 10*3/uL (ref 0.0–0.5)
HCT: 39.8 % (ref 34.8–46.6)
HGB: 13.1 g/dL (ref 11.6–15.9)
LYMPH%: 41.8 % (ref 14.0–49.7)
MCH: 30.1 pg (ref 25.1–34.0)
MCHC: 32.9 g/dL (ref 31.5–36.0)
MCV: 91.5 fL (ref 79.5–101.0)
MONO#: 0.6 10*3/uL (ref 0.1–0.9)
MONO%: 9.3 % (ref 0.0–14.0)
NEUT%: 46.8 % (ref 38.4–76.8)
NEUTROS ABS: 2.9 10*3/uL (ref 1.5–6.5)
PLATELETS: 389 10*3/uL (ref 145–400)
RBC: 4.35 10*6/uL (ref 3.70–5.45)
RDW: 13.9 % (ref 11.2–14.5)
WBC: 6.1 10*3/uL (ref 3.9–10.3)
lymph#: 2.6 10*3/uL (ref 0.9–3.3)

## 2014-12-12 NOTE — Progress Notes (Signed)
Clinical Social Work [place] Psychosocial Distress Screening [place]  Patient completed distress screening protocol and scored a 3 on the Psychosocial Distress Thermometer which indicates moderate distress. Clinical Social Worker met with patient and patients family in [facility] to assess for distress and other psychosocial needs. Patient stated she was doing "ok" and felt comfortable with her treatment plan and treatment team.  CSW and patient discussed common feeling and emotions when being diagnosed with cancer, and the importance of support during treatment. CSW informed patient of the support team and support services at [facility], and patient was agreeable to an alight guide. CSW provided contact information and encouraged patient to call with any questions or concerns.   ONCBCN DISTRESS SCREENING 12/12/2014  Screening Type Initial Screening  Distress experienced in past week (1-10) 3  Emotional problem type Nervousness/Anxiety  Physical Problem type Sleep/insomnia  Physician notified of physical symptoms Yes  Referral to clinical psychology No  Referral to clinical social work Yes  Referral to dietition No  Referral to financial advocate No  Referral to support programs Yes  Referral to palliative care No   Johnnye Lana, MSW, LCSW, OSW-C Clinical Social Worker [place] [phone]

## 2014-12-12 NOTE — Progress Notes (Signed)
Taylor Oconnor is a very pleasant 64 y.o. female from [place], [place] with newly diagnosed invasive ductal carcinoma with DCIS of the right breast. Biopsy results revealed the tumor's prognostic profile is ER positive, PR positive, and HER2/neu negative. Ki67 is 10%.  She presents today with her husband to the [facility]) for treatment consideration and recommendations from the breast surgeon, radiation oncologist, and medical oncologist.     I briefly met with Taylor Oconnor and her husband during her [facility] visit today. We discussed the purpose of the Survivorship Clinic, which will include monitoring for recurrence, coordinating completion of age and gender-appropriate cancer screenings, promotion of overall wellness, as well as managing potential late/long-term side effects of anti-cancer treatments.    The treatment plan for Taylor Oconnor will likely include surgery, radiation therapy, and anti-estrogen therapy. As of today, the intent of treatment for Taylor Oconnor is cure, therefore she will be eligible for the Survivorship Clinic upon her completion of treatment.  Her survivorship care plan (SCP) document will be drafted and updated throughout the course of her treatment trajectory. She will receive the SCP in an office visit with myself in the Survivorship Clinic once she has completed treatment.   Taylor Oconnor was encouraged to ask questions and all questions were answered to her satisfaction.  She was given my business card and encouraged to contact me with any concerns regarding survivorship.  I look forward to participating in her care.   Kenn File, [place] [phone]

## 2014-12-12 NOTE — Progress Notes (Signed)
Subjective:     Patient ID: Taylor Oconnor, female   DOB: [birth year], 64 y.o.   MRN: [id number]  HPI   Review of Systems     Objective:   Physical Exam For the patient to understand and be given the tools to implement a healthy plant based diet during their cancer diagnosis.     Assessment:     Patient was seen today and found to be in good spirits and accompanied y her husband. Pts ht 5'3'', 185 pounds, and BMI 32.4. Pts medications: probiotic acidophilus, b-complex vitamin, magnesium, vitamin D, fish oil, childrens vitamin, cinnomon, tumeric with black pepper. Pt does have diabetes. Pt states she has a lap band for 7 years and has lost 45 pounds, she had it last filled 2 years ago. Pt states she avoids sugar and simple carbohydrates as well as potatoes. Pt is not physically active other than a fairly active work environment. Pt states she will start eating as advised after she gets her colonoscopy.      Plan:     Dietitian educated the patient on implementing a plant based diet by incorporating more plant proteins, fruits, and vegetables. As a part of a healthy routine physical activity was discussed. Dietitian educated the pt on honoring her body and listening to her hunger cues.  The importance of legitimate, evidence based information was discussed and examples were given. A folder of evidence based information with a focus on a plant based diet and general nutrition during cancer was given to the patient.  As a part of the continuum of care the cancer dietitian's contact information was given to the patient in the event they would like to have a follow up appointment.

## 2014-12-12 NOTE — Progress Notes (Signed)
Radiation Oncology         [phone]) [phone] ________________________________  Initial outpatient Consultation - Date: 12/12/2014   Name: Taylor Oconnor MRN: [id number]   DOB: Nov 27, 1950  REFERRING PHYSICIAN: Unk Pinto, MD  DIAGNOSIS AND STAGE: No matching staging information was found for the patient.  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 female  who presented for a screening mammogram and was found to have two masses in the lateral right breast. These measured 6 mm and 8 mm on ultrasound and were 3.1 cm apart. Both were biopsied and were Grade 1 invasive ductal carcinoma. Both were ER+ and HER-2(-).  Family history of breast cancer in maternal cousin, age unspecified.   [name] is accompanied by her husband today. She currently works at [employer]. She has recovered well from her biopsy and has minimal pain.   PREVIOUS RADIATION THERAPY: No  Past medical, social and family history were reviewed in the electronic chart. Review of symptoms was reviewed in the electronic chart. Medications were reviewed in the electronic chart.   Gynecologic History  Age at first menstrual period? 15  Are you still having periods? No Approximate date of last period? 02/2000  If you are still having periods: Are your periods regular? No  If you no longer have periods: Have you used hormone replacement? No  If YES, for how long? N/A When did you stop? N/A Obstetric History:  How many children have you carried to term? 1 Your age at first live birth? 25  Pregnant now or trying to get pregnant? No  Have you used birth control pills or hormone shots for contraception? Yes  If so, for how long (or approximate dates)? 1975-8832  Would you be interested in learning more about the options to preserve fertility? N/A Health Maintenance:  Have you ever had a colonoscopy? Yes If yes, date? 2006 (one scheduled for  12/17/14)  Have you ever had a bone density? Yes If yes, date? Unknown  Date of your last PAP smear? 2014 Date of your FIRST mammogram? Unknown  PHYSICAL EXAM:  Vitals with BMI 12/12/2014  Height 5' 3.5"  Weight 185 lbs 11 oz  BMI 54.9  Systolic 826  Diastolic 62  Pulse 60  Respirations 18   Breast: biopsy change in the upper outer quadrant of the Right Breast. No palpable abnormalities. No palpable cervical, supraclavicular or axillary adenopathy. No palpable abnormalities of the left breast. Alert and oriented x 3.   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 We discussed the equivalence in terms of survival and local failure between mastectomy and breast conservation. We discussed the role of radiation in decreasing local failures in patients who undergo lumpectomy. We discussed the process of simulation and the placement tattoos. We discussed 4-6 weeks of treatment as an outpatient. We discussed the possibility of asymptomatic lung damage. We discussed the low likelihood of secondary malignancies. We discussed the possible side effects including but not limited to skin redness, fatigue, permanent skin darkening, and breast swelling. We discussed the process of simulation and the placement of tattoos. I will see her back after her Oncotype score. I did clarify with her that if she needed chemotherapy this would be performed prior to radiation.   She also met with surgery, medical oncology as well as a  member of our patient family support team, a dietitian, survivorship nurse practitioner, breast cancer navigator, and our physical therapist.  I spent 40 minutes face to face with the patient and more than 50% of that time was spent in counseling and/or coordination of care.   ------------------------------------------------  Thea Silversmith, MD  This document serves as a record of  services personally performed by Thea Silversmith, MD. It was created on her behalf by Arlyce Harman, a trained medical scribe. The creation of this record is based on the scribe's personal observations and the provider's statements to them. This document has been checked and approved by the attending provider.

## 2014-12-12 NOTE — Progress Notes (Signed)
Taylor Oconnor  Telephone:([phone]) [phone] Fax:([phone]) [phone]     ID: Taylor Oconnor DOB: [birth year]  MR#: [id number]  WIO#:[id number]  Patient Care Team: Taylor Pinto, MD as PCP - General (Internal Medicine) Taylor Overall, MD as Consulting Physician (General Surgery) Taylor Cruel, MD as Consulting Physician (Oncology) Taylor Silversmith, MD as Consulting Physician (Radiation Oncology) PCP: Taylor Richards, MD OTHER MD:  CHIEF COMPLAINT: Estrogen receptor positive breast cancer  CURRENT TREATMENT: Awaiting definite therapy   BREAST CANCER HISTORY: Taylor Oconnor's had routine bilateral digital screening mammography 11/16/2014 at the breast Center, showing a possible mass in the right breast. Right diagnostic mammography with tomosynthesis and right breast ultrasonography 11/27/2014 showed the breast density to be category B. There was a persistent spiculated mass in the upper outer right breast measuring 6 mm. Posterior to that there was a second mass measuring 1.6 cm. There was no palpable mass by exam. Ultrasonography confirmed a 0.6 cm irregular hypoechoic mass in the right breast at 10:00 7 cm from the nipple and another measuring 0.6 cm also in the 10:00 location but 9 cm from the nipple. The 2 masses were separated by approximately 2.9 cm. The axilla was sonographically benign.  On 11/28/2014 the patient underwent biopsy of the 2 masses in question the more posterior mass at 9 cm from the nipple was an invasive ductal carcinoma, grade 1, estrogen receptor 100% positive, progesterone receptor 85% positive, with an MIB-1 of 10% and no HER-2 amplification, the signals ratio being 1.41 and the number per cell 2.05.  The more anterior mass (measuring 0.6 cm) also showed an invasive ductal carcinoma, grade 1, estrogen receptor 100% positive, but progesterone receptor negative. The MIB-1 was 5%. This was also HER-2 negative, with a signals ratio 1.29 and the number per cell  2.00.  The patient's subsequent history is as detailed below  INTERVAL HISTORY: Taylor Oconnor was evaluated in the multidisciplinary breast cancer clinic 12/12/2014 accompanied by [name]. Her case was also presented in the multidisciplinary breast cancer conference that same morning. At that time a preliminary plan was proposed:  breast conserving surgery with sentinel lymph node sampling, with Oncotype testing, followed by radiation and then antiestrogen's.  REVIEW OF SYSTEMS: There were no specific symptoms leading to the original mammogram, which was routinely scheduled. The patient is status post lap band surgery, and has been able to maintain a lower level of weight. She denies unusual headaches, visual changes, nausea, vomiting, stiff neck, dizziness, or gait imbalance. There has been no cough, phlegm production, or pleurisy, no chest pain or pressure, and no change in bowel or bladder habits. The patient denies fever, rash, bleeding, unexplained fatigue or unexplained weight loss. She has mild seasonal allergies, and a history of hemorrhoidal bleeding. She does not exercise regularly. A detailed review of systems was otherwise entirely negative.  PAST MEDICAL HISTORY: Past Medical History  Diagnosis Date  . Hypertension   . H/O hiatal hernia     resolved with band surgery  . 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 At risk for difficult insertion of breathing tube   . Type II or unspecified type diabetes mellitus without mention of complication, not stated as uncontrolled 12/14/2012  . 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 Insomnia     tx with zanax  . Anemia     Hx - no current problems  . Anxiety   . GERD (gastroesophageal reflux disease)     hx - resolved with  lap band surgery  . Hyperlipidemia   . Breast cancer of upper-outer quadrant of right female breast ([place]) 12/03/2014  . Breast cancer ([place])     PAST SURGICAL HISTORY: Past Surgical History   Procedure Laterality Date  . Cholecystectomy    . Diagnostic laparoscopy    . Hernia repair  2008  . Tubal ligation    . Back surgery      cervical  . Laparoscopic appendectomy N/A 08/06/2012    Procedure: APPENDECTOMY LAPAROSCOPIC;  Surgeon: Taylor Regal, MD;  Location: [facility];  Service: General;  Laterality: N/A;  . Appendectomy  08/06/12    lap appy  . Laparoscopic gastric banding with hiatal hernia repair N/A Nov 2008  . Colonoscopy  09/29/2004    normal  . Cesarean section      x 1    FAMILY HISTORY Family History  Problem Relation Age of Onset  . Cancer Cousin     Breast  . Cirrhosis Mother   . Alcohol abuse Mother   . Diabetes Father   . Hypertension Father   . Stroke Father   . AAA (abdominal aortic aneurysm) Sister   . Colon cancer Neg Hx   . Rectal cancer Neg Hx   . Stomach cancer Neg Hx    the patient's father died from a stroke at age 31. The patient's mother died from cirrhosis of the liver at age 71. The patient had no brothers. She had 2 sisters, one of whom died from a ruptured aortic aneurysm. There is no history of breast or ovarian cancer in the family to the patient's knowledge  GYNECOLOGIC HISTORY:  No LMP recorded. Patient is postmenopausal. Menarche age 60, first live birth age 89. The patient is GX P1. She stopped having periods in 2002. She did not take hormone replacement. She used oral contraceptives for 11 years remotely, with no complications.  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 [name] is self-employed as a Herbalist area did [name] lives in [place]. She is currently unemployed. She is divorced. The patient's grand daughter, 15 years old, lives in [place] with the child's father. The patient is not a church attender   ADVANCED DIRECTIVES: Not in place   HEALTH MAINTENANCE: Social History  Substance Use Topics  . Smoking status: Never Smoker   . Smokeless  tobacco: Never Used  . Alcohol Use: No     Colonoscopy:  PAP:  Bone density:  Lipid panel:  Allergies  Allergen Reactions  . Codeine Itching  . Lipitor [Atorvastatin] Other (See Comments)    Severe fatigue. ? memory loss.  . Ace Inhibitors Rash    Current Outpatient Prescriptions  Medication Sig Dispense Refill  . acidophilus (RISAQUAD) CAPS Take 1 capsule by mouth daily.    Marland Kitchen ALPRAZolam (XANAX) 1 MG tablet TAKE 1/2 TO 1 TABLET 3 TIMES A DAY AS NEEDED 90 tablet 5  . aspirin 81 MG tablet Take 81 mg by mouth daily.    Marland Kitchen atenolol (TENORMIN) 100 MG tablet TAKE 1 TABLET EVERY DAY 90 tablet 1  . b complex vitamins capsule Take 1 capsule by mouth daily.    . calcium carbonate (TUMS) 500 MG chewable tablet Chew 1,500 mg by mouth at bedtime.    . Cholecalciferol (VITAMIN D3) 5000 UNITS CAPS Take 5,000 Units by mouth daily.    . Cinnamon 500 MG TABS Take 1 tablet by mouth 2 (two) times daily. Cinnamon 1000  mg, 1 tab in morning and 1 tab evening    . fenofibrate micronized (LOFIBRA) 134 MG capsule TAKE ONE CAPSULE BY MOUTH EVERY DAY 30 capsule 5  . Magnesium 250 MG TABS Take 250 mg by mouth 2 (two) times daily.    . Multiple Vitamins-Minerals (MULTIVITAMIN WITH MINERALS) tablet Take 1 tablet by mouth 2 (two) times daily.    . Omega-3 Fatty Acids (FISH OIL) 1000 MG CAPS Take 1,000 mg by mouth daily.    Marland Kitchen OVER THE COUNTER MEDICATION Take 1 tablet by mouth 2 (two) times daily. Tumeric with black pepper    . verapamil (CALAN) 80 MG tablet TAKE 1 TABLET BY MOUTH TWICE A DAY FOR BLOOD PRESSURE 180 tablet 3  . FLUARIX QUADRIVALENT 0.5 ML injection TO BE ADMINISTERED BY PHARMACIST FOR IMMUNIZATION  0  . hydrocortisone (ANUSOL-HC) 2.5 % rectal cream Place 1 application rectally 4 (four) times daily. (Patient taking differently: Place 1 application rectally 4 (four) times daily. prn) 60 g 3   No current facility-administered medications for this visit.    OBJECTIVE: Middle-aged white woman who  appears stated age 8 Vitals:   12/12/14 0911  BP: 129/62  Pulse: 60  Temp: 97.8 F (36.6 C)  Resp: 18     Body mass index is 32.38 kg/(m^2).    ECOG FS:0 - Asymptomatic  Ocular: Sclerae unicteric, pupils equal, round and reactive to light Ear-nose-throat: Oropharynx clear and moist Lymphatic: No cervical or supraclavicular adenopathy Lungs no rales or rhonchi, good excursion bilaterally Heart regular rate and rhythm, no murmur appreciated Abd soft, nontender, positive bowel sounds MSK no focal spinal tenderness, no joint edema Neuro: non-focal, well-oriented, appropriate affect Breasts: The right breast is status post recent biopsy. There is no palpable mass. There are no skin or nipple changes of concern. The right axilla is benign.   LAB RESULTS:  CMP     Component Value Date/Time   NA 143 12/12/2014 0846   NA 140 10/15/2014 1719   K 4.2 12/12/2014 0846   K 3.9 10/15/2014 1719   CL 101 10/15/2014 1719   CO2 27 12/12/2014 0846   CO2 26 10/15/2014 1719   GLUCOSE 91 12/12/2014 0846   GLUCOSE 90 10/15/2014 1719   BUN 16.3 12/12/2014 0846   BUN 14 10/15/2014 1719   CREATININE 0.9 12/12/2014 0846   CREATININE 0.77 10/15/2014 1719   CREATININE 0.83 08/06/2012 0815   CALCIUM 9.5 12/12/2014 0846   CALCIUM 9.3 10/15/2014 1719   PROT 6.8 12/12/2014 0846   PROT 6.3 10/29/2014 0846   ALBUMIN 3.8 12/12/2014 0846   ALBUMIN 3.8 10/29/2014 0846   AST 52* 12/12/2014 0846   AST 86* 10/29/2014 0846   ALT 75* 12/12/2014 0846   ALT 153* 10/29/2014 0846   ALKPHOS 41 12/12/2014 0846   ALKPHOS 37 10/29/2014 0846   BILITOT 0.49 12/12/2014 0846   BILITOT 0.4 10/29/2014 0846   GFRNONAA 82 10/15/2014 1719   GFRNONAA 75* 08/06/2012 0815   GFRAA >89 10/15/2014 1719   GFRAA 87* 08/06/2012 0815    INo results found for: SPEP, UPEP  Lab Results  Component Value Date   WBC 6.1 12/12/2014   NEUTROABS 2.9 12/12/2014   HGB 13.1 12/12/2014   HCT 39.8 12/12/2014   MCV 91.5  12/12/2014   PLT 389 12/12/2014      Chemistry      Component Value Date/Time   NA 143 12/12/2014 0846   NA 140 10/15/2014 1719   K 4.2 12/12/2014 0846  K 3.9 10/15/2014 1719   CL 101 10/15/2014 1719   CO2 27 12/12/2014 0846   CO2 26 10/15/2014 1719   BUN 16.3 12/12/2014 0846   BUN 14 10/15/2014 1719   CREATININE 0.9 12/12/2014 0846   CREATININE 0.77 10/15/2014 1719   CREATININE 0.83 08/06/2012 0815      Component Value Date/Time   CALCIUM 9.5 12/12/2014 0846   CALCIUM 9.3 10/15/2014 1719   ALKPHOS 41 12/12/2014 0846   ALKPHOS 37 10/29/2014 0846   AST 52* 12/12/2014 0846   AST 86* 10/29/2014 0846   ALT 75* 12/12/2014 0846   ALT 153* 10/29/2014 0846   BILITOT 0.49 12/12/2014 0846   BILITOT 0.4 10/29/2014 0846       No results found for: LABCA2  No components found for: [id number]  No results for input(s): INR in the last 168 hours.  Urinalysis    Component Value Date/Time   COLORURINE YELLOW 05/30/2013 0903   APPEARANCEUR CLEAR 05/30/2013 0903   LABSPEC 1.008 05/30/2013 0903   PHURINE 7.5 05/30/2013 0903   GLUCOSEU NEG 05/30/2013 0903   HGBUR NEG 05/30/2013 0903   BILIRUBINUR NEG 05/30/2013 0903   KETONESUR NEG 05/30/2013 0903   PROTEINUR NEG 05/30/2013 0903   UROBILINOGEN 0.2 05/30/2013 0903   NITRITE NEG 05/30/2013 0903   LEUKOCYTESUR NEG 05/30/2013 0903    STUDIES: Mm Digital Diagnostic Unilat R  11/28/2014  CLINICAL DATA:  Status post ultrasound-guided core needle biopsies of masses in the 10 o'clock position of the right breast, one 9 cm from the nipple and the other 7 cm from the nipple. EXAM: DIAGNOSTIC RIGHT MAMMOGRAM POST ULTRASOUND BIOPSY COMPARISON:  Previous exam(s). FINDINGS: Mammographic images were obtained following ultrasound guided biopsy of an 8 mm mass in the 10 o'clock position of the right breast, 9 cm from the nipple and a 6 mm mass in the 10 o'clock position of the right breast, 7 cm from the nipple. These demonstrate a coil shaped  biopsy marker clip in the 6 mm mass in the 10 o'clock position, 7 cm from the nipple and a ribbon shaped biopsy marker clip in the 8 mm mass in the 10 o'clock position, 9 cm from the nipple. IMPRESSION: Appropriate deployment of coil shaped and ribbon shaped biopsy marker clips following right breast ultrasound-guided core needle biopsies. Final Assessment: Post Procedure Mammograms for Marker Placement Electronically Signed   By: Claudie Revering M.D.   On: 11/28/2014 15:18   Mm Digital Screening Bilateral  11/16/2014  CLINICAL DATA:  Screening. EXAM: DIGITAL SCREENING BILATERAL MAMMOGRAM WITH CAD COMPARISON:  Previous exam(s). ACR Breast Density Category b: There are scattered areas of fibroglandular density. FINDINGS: In the right breast, a possible mass warrants further evaluation. In the left breast, no findings suspicious for malignancy. Images were processed with CAD. IMPRESSION: Further evaluation is suggested for possible mass in the right breast. RECOMMENDATION: Diagnostic mammogram and possibly ultrasound of the right breast. (Code:[id number]) The patient will be contacted regarding the findings, and additional imaging will be scheduled. BI-RADS CATEGORY  0: Incomplete. Need additional imaging evaluation and/or prior mammograms for comparison. Electronically Signed   By: Everlean Alstrom M.D.   On: 11/16/2014 16:52   Mm Radiologist Eval And Mgmt  11/29/2014  EXAM: ESTABLISHED PATIENT OFFICE VISIT - LEVEL II CHIEF COMPLAINT: Current Pain Level: 0 Status post ultrasound-guided core needle biopsies of 2 masses in the 10 o'clock position of the right breast yesterday. The patient reports no pain or bruising today. HISTORY OF PRESENT ILLNESS:  Status post ultrasound-guided core needle biopsy of an 8 mm mass in the 10 o'clock position of the right breast, 9 cm from the nipple and a 6 mm mass in the 10 o'clock position of the right breast, 7 cm from the nipple. EXAM: On physical examination, the biopsy site is  clean and dry with no bruising or palpable mass. The patient is not tender to palpation. 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 These diagnoses are concordant with the imaging findings. The pathological results were discussed with the patient and her husband. Their questions were answered. I spent a total of 20 minutes with the patient and her husband. ASSESSMENT AND PLAN: ASSESSMENT AND PLAN The patient will be contacted with an appointment in the [facility] at [facility]. Electronically Signed   By: Claudie Revering M.D.   On: 11/29/2014 16:46   US Breast Ltd Uni Right Inc Axilla  11/27/2014  CLINICAL DATA:  Patient recalled for right breast masses. EXAM: DIGITAL DIAGNOSTIC RIGHT MAMMOGRAM WITH CAD ULTRASOUND RIGHT BREAST COMPARISON:  Previous exam(s). ACR Breast Density Category b: There are scattered areas of fibroglandular density. FINDINGS: CC and MLO tomosynthesis images of the right breast were obtained. These demonstrate a persistent spiculated 6 mm mass within the upper-outer right breast. Posterior to this is an additional spiculated 1.6 cm mass. No additional concerning masses, calcifications or architectural distortion identified within the right breast. Mammographic images were processed with CAD. On physical exam, I palpate no discrete mass within the upper-outer right breast. Targeted ultrasound is performed, showing a 0.4 x 0.4 x 0.6 cm irregular hypoechoic mass within the right breast 10 o'clock position 7 cm from the nipple. Additionally there is a 0.7 x 0.8 x 0.8 cm taller than wide irregular hypoechoic mass with posterior acoustic shadowing within the right breast 10 o'clock position 9 cm from the nipple. These 2 masses are  separated by approximately 2.9 cm. There is a prominent equivocal lymph node within the right breast 9 o'clock position 10 cm from the nipple. No enlarged right axillary lymph nodes are identified. IMPRESSION: Two adjacent suspicious right breast masses within the 10 o'clock position. Equivocal intramammary lymph node within the right breast. RECOMMENDATION: Ultrasound-guided core needle biopsy of 2 adjacent right breast masses 10 o'clock position 7 cm from the nipple at 10 o'clock position 9 cm from the nipple. Recommend clinical and pathologic correlation for determination of needed biopsy of the equivocal right breast intramammary lymph node. Biopsy scheduled for 11/28/2014. I have discussed the findings and recommendations with the patient. Results were also provided in writing at the conclusion of the visit. If applicable, a reminder letter will be sent to the patient regarding the next appointment. BI-RADS CATEGORY  4: Suspicious. Electronically Signed   By: Lovey Newcomer M.D.   On: 11/27/2014 09:14   Mm Diag Breast Tomo Uni Right  11/27/2014  CLINICAL DATA:  Patient recalled for right breast masses. EXAM: DIGITAL DIAGNOSTIC RIGHT MAMMOGRAM WITH CAD ULTRASOUND RIGHT BREAST COMPARISON:  Previous exam(s). ACR Breast Density Category b: There are scattered areas of fibroglandular density. FINDINGS: CC and MLO tomosynthesis images of the right breast were obtained. These demonstrate a persistent spiculated 6 mm mass within the upper-outer right breast.  Posterior to this is an additional spiculated 1.6 cm mass. No additional concerning masses, calcifications or architectural distortion identified within the right breast. Mammographic images were processed with CAD. On physical exam, I palpate no discrete mass within the upper-outer right breast. Targeted ultrasound is performed, showing a 0.4 x 0.4 x 0.6 cm irregular hypoechoic mass within the right breast 10 o'clock position 7 cm from the nipple. Additionally  there is a 0.7 x 0.8 x 0.8 cm taller than wide irregular hypoechoic mass with posterior acoustic shadowing within the right breast 10 o'clock position 9 cm from the nipple. These 2 masses are separated by approximately 2.9 cm. There is a prominent equivocal lymph node within the right breast 9 o'clock position 10 cm from the nipple. No enlarged right axillary lymph nodes are identified. IMPRESSION: Two adjacent suspicious right breast masses within the 10 o'clock position. Equivocal intramammary lymph node within the right breast. RECOMMENDATION: Ultrasound-guided core needle biopsy of 2 adjacent right breast masses 10 o'clock position 7 cm from the nipple at 10 o'clock position 9 cm from the nipple. Recommend clinical and pathologic correlation for determination of needed biopsy of the equivocal right breast intramammary lymph node. Biopsy scheduled for 11/28/2014. I have discussed the findings and recommendations with the patient. Results were also provided in writing at the conclusion of the visit. If applicable, a reminder letter will be sent to the patient regarding the next appointment. BI-RADS CATEGORY  4: Suspicious. Electronically Signed   By: Lovey Newcomer M.D.   On: 11/27/2014 09:14   [place] Rt Breast Bx W Loc Dev 1st Lesion Img Bx Spec US Guide  11/28/2014  CLINICAL DATA:  8 mm mass in the 10 o'clock position of the right breast, 9 cm from the nipple, with imaging features suspicious for malignancy. EXAM: ULTRASOUND GUIDED RIGHT BREAST CORE NEEDLE BIOPSY COMPARISON:  Previous exam(s). FINDINGS: I met with the patient and we discussed the procedure of ultrasound-guided biopsy, including benefits and alternatives. We discussed the high likelihood of a successful procedure. We discussed the risks of the procedure, including infection, bleeding, tissue injury, clip migration, and inadequate sampling. Informed written consent was given. The usual time-out protocol was performed immediately prior to the  procedure. Using sterile technique and 2% Lidocaine as local anesthetic, under direct ultrasound visualization, a 12 gauge spring-loaded device was used to perform biopsy of the recently demonstrated 8 mm mass in the 10 o'clock position of the right breast using a caudal approach. At the conclusion of the procedure a ribbon shaped tissue marker clip was deployed into the biopsy cavity. Follow up 2 view mammogram was performed and dictated separately. IMPRESSION: Ultrasound guided biopsy of an 8 mm mass in the 10 o'clock position of the right breast, 9 cm from the nipple. No apparent complications. Electronically Signed   By: Claudie Revering M.D.   On: 11/28/2014 11:51   [place] Rt Breast Bx W Loc Dev Ea Add Lesion Img Bx Spec US Guide  11/28/2014  CLINICAL DATA:  6 mm mass in the 10 o'clock position of the right breast, 7 cm from the nipple, with imaging features suspicious for malignancy. EXAM: ULTRASOUND GUIDED RIGHT BREAST CORE NEEDLE BIOPSY COMPARISON:  Previous exam(s). FINDINGS: I met with the patient and we discussed the procedure of ultrasound-guided biopsy, including benefits and alternatives. We discussed the high likelihood of a successful procedure. We discussed the risks of the procedure, including infection, bleeding, tissue injury, clip migration, and inadequate sampling. Informed written consent was given.  The usual time-out protocol was performed immediately prior to the procedure. Using sterile technique and 2% Lidocaine as local anesthetic, under direct ultrasound visualization, a 12 gauge spring-loaded device was used to perform biopsy of the recently demonstrated 6 mm mass in the 10 o'clock position of the right breast, 7 cm from the nipple using a caudal approach. At the conclusion of the procedure a coil shaped tissue marker clip was deployed into the biopsy cavity. Follow up 2 view mammogram was performed and dictated separately. IMPRESSION: Ultrasound guided biopsy of a 6 mm mass in the 10  o'clock position of the right breast, 7 cm from the nipple. No apparent complications. Electronically Signed   By: Claudie Revering M.D.   On: 11/28/2014 11:52    ASSESSMENT: 64 y.o. [place] woman status post biopsy 11/28/2014 of 2 separate right breast masses, both invasive ductal carcinomas, grade 1, estrogen receptor positive, and HER-2 not amplified. MIB-1 is 5-10%. The smaller mass is progesterone receptor negative  (1) definitive surgery pending  (2) Oncotype to be sent from one of the 2 masses, depending on the final pathology reading  (3) adjuvant radiation to follow as appropriate  (4) adjuvant antiestrogen therapy to follow completion of local treatment.  PLAN: We spent the better part of today's hour-long appointment discussing the biology of breast cancer in general, and the specifics of the patient's tumor in particular. Leba understands that while the 2 tumors in her right breast are very similar, they're still distinct. Their prognostic profiles are slightly different. The good news is that neither is aggressive looking and both are slow growing. In general, chemotherapy works best for aggressive rapidly growing tumors. Accordingly our expectation is that the benefit of chemotherapy in her case would be marginal and likely can be avoided.  However to make sure we are going to request an Oncotype from one of the 2 lesions. Which one we obtained it on will depend on the final pathology. Specifically if the progesterone receptor tumor is 5 mm or less we would not obtain it from that 1.  We also discussed the difference between local and systemic treatment. She understands after surgery and radiation she will have a low risk of local recurrence. Antiestrogen is would cut that already low risk again in half.  Her risk of systemic recurrence also would be cut in half by anti-estrogens. We will be starting those at the completion of local therapy and before she sees me in approximately  2-1/2 months to discuss that we will obtain a repeat bone density which will help [place] decide on the initial treatment option as far as antiestrogen therapy is concerned.  Alizaya has a good understanding of the Oconnor plan. She agrees with it. She knows the goal of treatment in her case is cure. She will call with any problems that may develop before her next visit here.  Taylor Cruel, MD   12/12/2014 2:08 PM Medical Oncology and Hematology [facility] [address] [place], [place] [address] Tel. [phone]    Fax. [phone]

## 2014-12-12 NOTE — Therapy (Signed)
[place], [place], [address] Phone: [phone]   Fax:  [phone]  Physical Therapy Evaluation  Patient Details  Name: Taylor Oconnor MRN: [id number] Date of Birth: June 13, 1950 Referring Provider: Dr. Alphonsa Overall  Encounter Date: 12/12/2014      PT End of Session - 12/12/14 1155    Visit Number 1   Number of Visits 1   PT Start Time 1034   PT Stop Time 1059   PT Time Calculation (min) 25 min   Activity Tolerance Patient tolerated treatment well   Behavior During Therapy [facility] for tasks assessed/performed      Past Medical History  Diagnosis Date  . Hypertension   . H/O hiatal hernia     resolved with band surgery  . 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 At risk for difficult insertion of breathing tube   . Type II or unspecified type diabetes mellitus without mention of complication, not stated as uncontrolled 12/14/2012  . 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 Insomnia     tx with zanax  . Anemia     Hx - no current problems  . Anxiety   . GERD (gastroesophageal reflux disease)     hx - resolved with lap band surgery  . Hyperlipidemia   . Breast cancer of upper-outer quadrant of right female breast ([place]) 12/03/2014  . Breast cancer [facility])     Past Surgical History  Procedure Laterality Date  . Cholecystectomy    . Diagnostic laparoscopy    . Hernia repair  2008  . Tubal ligation    . Back surgery      cervical  . Laparoscopic appendectomy N/A 08/06/2012    Procedure: APPENDECTOMY LAPAROSCOPIC;  Surgeon: Earnstine Regal, MD;  Location: [facility];  Service: General;  Laterality: N/A;  . Appendectomy  08/06/12    lap appy  . Laparoscopic gastric banding with hiatal hernia repair N/A Nov 2008  . Colonoscopy  09/29/2004    normal  . Cesarean section      x 1    There were no vitals filed for this visit.  Visit Diagnosis:  Malignant neoplasm of right female breast,  unspecified site of breast ([place]) - Plan: PT plan of care cert/re-cert  Abnormal posture - Plan: PT plan of care cert/re-cert      Subjective Assessment - 12/12/14 1113    Subjective Patient was seen today for a baseline assessment of her newly diagnosed right breast cancer.   Pertinent History Patient was diagnosed 11/15/14 with right invasive breast cancer.  She has 2 masses measuring 0.6 and 0.8 cm which are ER positive and PR negative and HER2 negative.   Patient Stated Goals Reduce lymphedema risk and learn post op shoulder ROM HEP   Currently in Pain? No/denies            [facility] PT Assessment - 12/12/14 0001    Assessment   Medical Diagnosis Right breast cancer   Referring Provider Dr. Alphonsa Overall   Onset Date/Surgical Date 11/15/14   Hand Dominance Right   Prior Therapy none   Precautions   Precautions Other (comment)  Right breast cancer   Restrictions   Weight Bearing Restrictions No   Balance Screen   Has the patient fallen in the past 6 months No   Has the patient had a decrease in activity level because of a fear of falling?  No   Is the  patient reluctant to leave their home because of a fear of falling?  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   Leisure She does not exercise   Cognition   Overall Cognitive Status Within Functional Limits for tasks assessed   Posture/Postural Control   Posture/Postural Control Postural limitations   Postural Limitations Rounded Shoulders;Forward head   ROM / Strength   AROM / PROM / Strength AROM;Strength   AROM   AROM Assessment Site Shoulder   Right/Left Shoulder Right;Left   Right Shoulder Extension 50 Degrees   Right Shoulder Flexion 152 Degrees   Right Shoulder ABduction 169 Degrees    Right Shoulder Internal Rotation 67 Degrees   Right Shoulder External Rotation 82 Degrees   Left Shoulder Extension 62 Degrees   Left Shoulder Flexion 156 Degrees   Left Shoulder ABduction 160 Degrees   Left Shoulder Internal Rotation 65 Degrees   Left Shoulder External Rotation 88 Degrees   Strength   Overall Strength Within functional limits for tasks performed           LYMPHEDEMA/ONCOLOGY QUESTIONNAIRE - 12/12/14 1143    Type   Cancer Type right breast cancer   Lymphedema Assessments   Lymphedema Assessments Upper extremities   Right Upper Extremity Lymphedema   10 cm Proximal to Olecranon Process 29.5 cm   Olecranon Process 26.8 cm   10 cm Proximal to Ulnar Styloid Process 24.9 cm   Just Proximal to Ulnar Styloid Process 17.5 cm   Across Hand at [employer] 19.4 cm   At [place] of 2nd Digit 6.4 cm   Left Upper Extremity Lymphedema   10 cm Proximal to Olecranon Process 31 cm   Olecranon Process 26.6 cm   10 cm Proximal to Ulnar Styloid Process 23.6 cm   Just Proximal to Ulnar Styloid Process 17.5 cm   Across Hand at [employer] 18.9 cm   At [place] of 2nd Digit 6.2 cm      Patient was instructed today in a home exercise program today for post op shoulder range of motion. These included active assist shoulder flexion in sitting, scapular retraction, wall walking with shoulder abduction, and hands behind head external rotation.  She was encouraged to do these twice a day, holding 3 seconds and repeating 5 times when permitted by her physician.         PT Education - 12/12/14 1153    Education provided Yes   Education Details Lymphedema risk reduction and post op shoulder ROM HEP   Person(s) Educated Patient   Methods Explanation;Demonstration;Handout   Comprehension Returned demonstration;Verbalized understanding              Breast Clinic Goals - 12/12/14 1159    Patient will be able to verbalize understanding of pertinent lymphedema risk reduction  practices relevant to her diagnosis specifically related to skin care.   Time 1   Period Days   Status Achieved   Patient will be able to return demonstrate and/or verbalize understanding of the post-op home exercise program related to regaining shoulder range of motion.   Time 1   Period Days   Status Achieved   Patient will be able to verbalize understanding of the importance of attending the postoperative After Breast Cancer Class  for further lymphedema risk reduction education and therapeutic exercise.   Time 1   Period Days   Status Achieved              Plan - 12/12/14 1155    Clinical Impression Statement Patient was diagnosed 11/15/14 with right invasive breast cancer.  She has 2 masses measuring 0.6 and 0.8 cm which are ER positive and PR negative and HER2 negative.  She is planning to have a right lumpectomy with a sentinel node biopsy followed by radiation and anti-estrogen therapy.  She may benefit from post op PT to regain shoulder ROM and strength and reduce lymphedema risk.   Pt will benefit from skilled therapeutic intervention in order to improve on the following deficits Decreased strength;Decreased knowledge of precautions;Pain;Impaired UE functional use;Decreased range of motion   Rehab Potential Excellent   Clinical Impairments Affecting Rehab Potential none   PT Frequency One time visit   PT Treatment/Interventions Therapeutic exercise;Patient/family education   Consulted and Agree with Plan of Care Patient;Family member/caregiver   Family Member Consulted husband     Patient will follow up at outpatient cancer rehab if needed following surgery.  If the patient requires physical therapy at that time, a specific plan will be dictated and sent to the referring physician for approval. The patient was educated today on appropriate basic range of motion exercises to begin post operatively and the importance of attending the After Breast Cancer class following surgery.   Patient was educated today on lymphedema risk reduction practices as it pertains to recommendations that will benefit the patient immediately following surgery.  She verbalized good understanding.  No additional physical therapy is indicated at this time.       Problem List Patient Active Problem List   Diagnosis Date Noted  . Breast cancer of upper-outer quadrant of right female breast ([place]) 12/03/2014  . Medication management 12/18/2013  . Essential hypertension 12/14/2012  . Mixed hyperlipidemia 12/14/2012  . 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 Vitamin D deficiency 12/14/2012  . Morbid obesity ([place]) 12/14/2012  . Lapband APS + [facility] repair Nov 2008 08/26/2012    Annia Friendly, PT 12/12/2014 12:08 PM   [place], [place], [address] Phone: [phone]   Fax:  [phone]  Name: Taylor Oconnor MRN: [id number] Date of Birth: [birth year]

## 2014-12-12 NOTE — Patient Instructions (Signed)

## 2014-12-13 ENCOUNTER — Telehealth: Payer: Self-pay | Admitting: Oncology

## 2014-12-13 NOTE — Telephone Encounter (Signed)
s.w pt and advised on Jan and Feb appt....pt ok and aware °

## 2014-12-17 ENCOUNTER — Encounter: Payer: Self-pay | Admitting: Gastroenterology

## 2014-12-17 ENCOUNTER — Ambulatory Visit (AMBULATORY_SURGERY_CENTER): Payer: 59 | Admitting: Gastroenterology

## 2014-12-17 ENCOUNTER — Telehealth: Payer: Self-pay | Admitting: *Deleted

## 2014-12-17 VITALS — BP 111/70 | HR 69 | Temp 98.5°F | Resp 23 | Ht 63.0 in | Wt 178.0 lb

## 2014-12-17 DIAGNOSIS — K635 Polyp of colon: Secondary | ICD-10-CM

## 2014-12-17 DIAGNOSIS — Z1211 Encounter for screening for malignant neoplasm of colon: Secondary | ICD-10-CM | POA: Diagnosis not present

## 2014-12-17 DIAGNOSIS — D123 Benign neoplasm of transverse colon: Secondary | ICD-10-CM

## 2014-12-17 MED ORDER — SODIUM CHLORIDE 0.9 % IV SOLN: 500.0000 mL | INTRAVENOUS | Status: DC

## 2014-12-17 NOTE — Progress Notes (Signed)
Called to room to assist during endoscopic procedure.  Patient ID and intended procedure confirmed with present staff. Received instructions for my participation in the procedure from the performing physician.  

## 2014-12-17 NOTE — Progress Notes (Signed)
Report to PACU, RN, vss, BBS= Clear.  

## 2014-12-17 NOTE — Telephone Encounter (Signed)
Spoke to pt concerning [facility] 12/12/14. Denies questions or concerns regarding dx or treatment care plan. Encourage pt to call with needs. Received verbal understanding.

## 2014-12-17 NOTE — Op Note (Signed)
[place]  [address] [place], [address]   COLONOSCOPY PROCEDURE REPORT  PATIENT: Taylor Oconnor, Taylor Oconnor  MR#: [id number] BIRTHDATE: 1950-12-05 , 64  yrs. old GENDER: female ENDOSCOPIST: Harl Bowie, MD REFERRED [id number] Melford Aase, M.D. PROCEDURE DATE:  12/17/2014 PROCEDURE:   Colonoscopy, screening First Screening Colonoscopy - Avg.  risk and is 50 yrs.  old or older - No.  Prior Negative Screening - Now for repeat screening. 10 or more years since last screening  History of Adenoma - Now for follow-up colonoscopy & has been > or = to 3 yrs.  N/A  Polyps removed today? Yes ASA CLASS:   Class II INDICATIONS:Screening for colonic neoplasia and Colorectal Neoplasm Risk Assessment for this procedure is average risk. MEDICATIONS: Propofol 250 mg IV  DESCRIPTION OF PROCEDURE:   After the risks benefits and alternatives of the procedure were thoroughly explained, informed consent was obtained.  The digital rectal exam revealed no abnormalities of the rectum.   The LB PCF Q180 [id number]  endoscope was introduced through the anus and advanced to the cecum, which was identified by both the appendix and ileocecal valve. No adverse events experienced.   The quality of the prep was good.  The instrument was then slowly withdrawn as the colon was fully examined. Estimated blood loss is zero unless otherwise noted in this procedure report.   COLON FINDINGS: A sessile polyp ranging between 5-38mm in size was found in the transverse colon.  A polypectomy was performed with a cold snare.  The resection was complete, noted some oozing of blood at the polypectomy site likely from small submucosal vessel, hemostasis achieved with placement of a hemostatic clip. The polyp tissue was completely retrieved and sent to histology.  Retroflexed views revealed internal hemorrhoids. The time to cecum = 4.3 Withdrawal time = 14.4   The scope was withdrawn and the  procedure completed. COMPLICATIONS: There were no immediate complications.  ENDOSCOPIC IMPRESSION: Sessile polyp ranging between 5-50mm in size was found in the transverse colon; polypectomy was performed with a cold snare Small internal hemorrhoids and external skin tag  RECOMMENDATIONS: If the polyp(s) removed today are proven to be adenomatous (pre-cancerous) polyps, you will need a repeat colonoscopy in 5 years.  Otherwise you should continue to follow colorectal cancer screening guidelines for "routine risk" patients with colonoscopy in 10 years.  You will receive a letter within 1-2 weeks with the results of your biopsy as well as final recommendations.  Please call my office if you have not received a letter after 3 weeks.  eSigned:  Harl Bowie, MD 12/17/2014 11:57 AM

## 2014-12-17 NOTE — Patient Instructions (Signed)
YOU HAD AN ENDOSCOPIC PROCEDURE TODAY AT [facility]:   Refer to the procedure report that was given to you for any specific questions about what was found during the examination.  If the procedure report does not answer your questions, please call your gastroenterologist to clarify.  If you requested that your care partner not be given the details of your procedure findings, then the procedure report has been included in a sealed envelope for you to review at your convenience later.  YOU SHOULD EXPECT: Some feelings of bloating in the abdomen. Passage of more gas than usual.  Walking can help get rid of the air that was put into your GI tract during the procedure and reduce the bloating. If you had a lower endoscopy (such as a colonoscopy or flexible sigmoidoscopy) you may notice spotting of blood in your stool or on the toilet paper. If you underwent a bowel prep for your procedure, you may not have a normal bowel movement for a few days.  Please Note:  You might notice some irritation and congestion in your nose or some drainage.  This is from the oxygen used during your procedure.  There is no need for concern and it should clear up in a day or so.  SYMPTOMS TO REPORT IMMEDIATELY:   Following lower endoscopy (colonoscopy or flexible sigmoidoscopy):  Excessive amounts of blood in the stool  Significant tenderness or worsening of abdominal pains  Swelling of the abdomen that is new, acute  Fever of 100F or higher   For urgent or emergent issues, a gastroenterologist can be reached at any hour by calling [phone].   DIET: Your first meal following the procedure should be a small meal and then it is ok to progress to your normal diet. Heavy or fried foods are harder to digest and may make you feel nauseous or bloated.  Likewise, meals heavy in dairy and vegetables can increase bloating.  Drink plenty of fluids but you should avoid alcoholic beverages for 24  hours.  ACTIVITY:  You should plan to take it easy for the rest of today and you should NOT DRIVE or use heavy machinery until tomorrow (because of the sedation medicines used during the test).    FOLLOW UP: Our staff will call the number listed on your records the next business day following your procedure to check on you and address any questions or concerns that you may have regarding the information given to you following your procedure. If we do not reach you, we will leave a message.  However, if you are feeling well and you are not experiencing any problems, there is no need to return our call.  We will assume that you have returned to your regular daily activities without incident.  If any biopsies were taken you will be contacted by phone or by letter within the next 1-3 weeks.  Please call us at [phone] if you have not heard about the biopsies in 3 weeks.    SIGNATURES/CONFIDENTIALITY: You and/or your care partner have signed paperwork which will be entered into your electronic medical record.  These signatures attest to the fact that that the information above on your After Visit Summary has been reviewed and is understood.  Full responsibility of the confidentiality of this discharge information lies with you and/or your care-partner.  Polyp handout given Avoid Aspirin, Naprosyn, Motrin and Aleve for 2 weeks Clipping card given

## 2014-12-18 ENCOUNTER — Telehealth: Payer: Self-pay | Admitting: *Deleted

## 2014-12-18 ENCOUNTER — Other Ambulatory Visit: Payer: Self-pay | Admitting: Surgery

## 2014-12-18 DIAGNOSIS — C50911 Malignant neoplasm of unspecified site of right female breast: Secondary | ICD-10-CM

## 2014-12-18 NOTE — Telephone Encounter (Signed)
  Follow up Call-  Call back number 12/17/2014  Post procedure Call Back phone  # [phone]  Permission to leave phone message Yes     Patient questions:  Message left to call us if necessary.

## 2014-12-19 ENCOUNTER — Other Ambulatory Visit: Payer: Self-pay | Admitting: Surgery

## 2014-12-19 DIAGNOSIS — C50911 Malignant neoplasm of unspecified site of right female breast: Secondary | ICD-10-CM

## 2014-12-21 ENCOUNTER — Other Ambulatory Visit: Payer: Self-pay | Admitting: Internal Medicine

## 2014-12-25 ENCOUNTER — Encounter (HOSPITAL_BASED_OUTPATIENT_CLINIC_OR_DEPARTMENT_OTHER): Payer: Self-pay | Admitting: *Deleted

## 2014-12-26 ENCOUNTER — Encounter: Payer: Self-pay | Admitting: Gastroenterology

## 2014-12-27 ENCOUNTER — Encounter (HOSPITAL_BASED_OUTPATIENT_CLINIC_OR_DEPARTMENT_OTHER)
Admission: RE | Admit: 2014-12-27 | Discharge: 2014-12-27 | Disposition: A | Discharge status: Home / Self Care | Payer: 59 | Source: Ambulatory Visit | Attending: Surgery | Admitting: Surgery

## 2014-12-27 DIAGNOSIS — I129 Hypertensive chronic kidney disease with stage 1 through stage 4 chronic kidney disease, or unspecified chronic kidney disease: Secondary | ICD-10-CM | POA: Diagnosis not present

## 2014-12-27 DIAGNOSIS — Z170 Estrogen receptor positive status [ER+]: Secondary | ICD-10-CM | POA: Diagnosis not present

## 2014-12-27 DIAGNOSIS — Z9884 Bariatric surgery status: Secondary | ICD-10-CM | POA: Diagnosis not present

## 2014-12-27 DIAGNOSIS — K219 Gastro-esophageal reflux disease without esophagitis: Secondary | ICD-10-CM | POA: Diagnosis not present

## 2014-12-27 DIAGNOSIS — E1122 Type 2 diabetes mellitus with diabetic chronic kidney disease: Secondary | ICD-10-CM | POA: Diagnosis not present

## 2014-12-27 DIAGNOSIS — F419 Anxiety disorder, unspecified: Secondary | ICD-10-CM | POA: Diagnosis not present

## 2014-12-27 DIAGNOSIS — N62 Hypertrophy of breast: Secondary | ICD-10-CM | POA: Diagnosis not present

## 2014-12-27 DIAGNOSIS — Z79899 Other long term (current) drug therapy: Secondary | ICD-10-CM | POA: Diagnosis not present

## 2014-12-27 DIAGNOSIS — C50411 Malignant neoplasm of upper-outer quadrant of right female breast: Secondary | ICD-10-CM | POA: Diagnosis present

## 2014-12-27 DIAGNOSIS — E7800 Pure hypercholesterolemia, unspecified: Secondary | ICD-10-CM | POA: Diagnosis not present

## 2014-12-27 DIAGNOSIS — Z9049 Acquired absence of other specified parts of digestive tract: Secondary | ICD-10-CM | POA: Diagnosis not present

## 2014-12-27 DIAGNOSIS — N189 Chronic kidney disease, unspecified: Secondary | ICD-10-CM | POA: Diagnosis not present

## 2014-12-27 DIAGNOSIS — E282 Polycystic ovarian syndrome: Secondary | ICD-10-CM | POA: Diagnosis not present

## 2014-12-27 NOTE — H&P (Signed)
Taylor Beech. Oconnor  Location: [facility] Patient #: [id number] DOB: 1950-05-06 Married / Language: English / Race: White Female   History of Present Illness   The patient is a 64 year old female who presents with breast cancer.   Her PCP is Dr. Leary Roca.  She is at the [facility]. Drs [name] and Pablo Ledger are her oncologist.  Limmie Patricia with her   She presents with a right breast cancer. She had a mammogram on 11/27/2014 at [facility] which showed two adjacent lesions within the 10 o'clock position of the right breast. She had a right breast biopsy on 11/28/2014 ([id number]) which showed 10 o'clock (9 cm from nipple) IDC, ER - 100%, PR - 85%, Ki67 - 10%, Her2Neu - neg and 10 o'clock (7 cm from nipple) IDC that is ER - 100%, PR - 0%, Ki67 - 5%, Her2Neu - neg.  She has had annual mammograms. She has had no prior breast problems. She is not on hormone therapy. She has no fam hx of breast cancer.   I discussed the options for breast cancer treatment with the patient. She is at the [facility], which includes medical oncology and radiation oncology. I discussed the surgical options of lumpectomy vs. mastectomy. If mastectomy, there is the possibility of reconstruction. I discussed the options of lymph node biopsy. The treatment plan depends on the pathologic staging of the tumor and the patient's personal wishes.  The risks of surgery include, but are not limited to, bleeding, infection, the need for further surgery, and nerve injury. The patient has been given literature on the treatment of breast cancer.  Plan: 1) right breast lumpectomy x 2 (1 incision - wire vs needle loc) and right axillary SLNBx [talked to B. Owens Shark - will proceed with two wire locs], 2) Oncotype, 3) rad tx, 4) Anti estrogen tx  Past Medical History: 1. Polycystic ovary disease 2. Lap band Hassell Done Surgery - 11/16/2006. She has maintained 40 pound weight  loss 3. HTN 4. Hypercholesterolemia 5. Getting colonoscopy Monday, 12/17/2014, at [facility] 6. Neck surgery Disckectomy and fusion - C5-6 - Hirsch - 07/11/2002 7. Lap appendectomy - 08/06/2012 - [facility]  Social History: Married. 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   Other Problems Conni Slipper, RN; 12/12/2014 7:58 AM) Breast Cancer Cholelithiasis Chronic Renal Failure Syndrome Diabetes Mellitus Gastroesophageal Reflux Disease General anesthesia - complications Hemorrhoids High blood pressure Lump In Breast Ventral Hernia Repair  Past Surgical History Conni Slipper, RN; 12/12/2014 7:58 AM) Appendectomy Breast Biopsy Right. Cesarean Section - 1 Gallbladder Surgery - Laparoscopic Lap Band Oral Surgery Spinal Surgery - Neck Ventral / Umbilical Hernia Surgery Bilateral.  Diagnostic Studies History Conni Slipper, RN; 12/12/2014 7:58 AM) Colonoscopy >10 years ago 5-10 years ago Mammogram within last year Pap Smear 1-5 years ago  Medication History Conni Slipper, RN; 12/12/2014 7:59 AM) Medications Reconciled  Social History Conni Slipper, RN; 12/12/2014 7:58 AM) Alcohol use Occasional alcohol use, Remotely quit alcohol use. Caffeine use Coffee. No drug use Tobacco use Former smoker, Never smoker.  Family History Conni Slipper, RN; 12/12/2014 7:58 AM) Alcohol Abuse Daughter, Family Members In General, Father, Mother, Sister. Arthritis Family Members In General. Breast Cancer Family Members In General. Cerebrovascular Accident Father. Diabetes Mellitus Father. Hypertension Father. Respiratory Condition Sister.  Pregnancy / Birth History Conni Slipper, RN;  12/12/2014 7:58 AM) Age at menarche 45 years, 15 years. Age of menopause 52-50 Contraceptive History Oral  contraceptives. Gravida 1 Irregular periods Maternal age 65-35 Para 1    Review of Systems Conni Slipper RN; 12/12/2014 7:58 AM) General Not Present- Appetite Loss, Chills, Fatigue, Fever, Night Sweats, Weight Gain and Weight Loss. Skin Present- Dryness. Not Present- Change in Wart/Mole, Hives, Jaundice, New Lesions, Non-Healing Wounds, Rash and Ulcer. HEENT Present- Ringing in the Ears, Seasonal Allergies and Wears glasses/contact lenses. Not Present- Earache, Hearing Loss, Hoarseness, Nose Bleed, Oral Ulcers, Sinus Pain, Sore Throat, Visual Disturbances and Yellow Eyes. Respiratory Present- Snoring. Not Present- Bloody sputum, Chronic Cough, Difficulty Breathing and Wheezing. Breast Present- Skin Changes. Not Present- Breast Mass, Breast Pain and Nipple Discharge. Cardiovascular Present- Swelling of Extremities. Not Present- Chest Pain, Difficulty Breathing Lying Down, Leg Cramps, Palpitations, Rapid Heart Rate and Shortness of Breath. Gastrointestinal Present- Hemorrhoids. Not Present- Abdominal Pain, Bloating, Bloody Stool, Change in Bowel Habits, Chronic diarrhea, Constipation, Difficulty Swallowing, Excessive gas, Gets full quickly at meals, Indigestion, Nausea, Rectal Pain and Vomiting. Female Genitourinary Not Present- Frequency, Nocturia, Painful Urination, Pelvic Pain and Urgency. Musculoskeletal Present- Joint Pain. Not Present- Back Pain, Joint Stiffness, Muscle Pain, Muscle Weakness and Swelling of Extremities. Neurological Not Present- Decreased Memory, Fainting, Headaches, Numbness, Seizures, Tingling, Tremor, Trouble walking and Weakness. Psychiatric Not Present- Anxiety, Bipolar, Change in Sleep Pattern, Depression, Fearful and Frequent crying. Endocrine Present- New Diabetes. Not Present- Cold Intolerance, Excessive Hunger, Hair Changes, Heat Intolerance and Hot flashes. Hematology Not Present- Easy Bruising, Excessive bleeding, Gland problems, HIV and Persistent  Infections.   Physical Exam  General: WN older WF alert and generally healthy appearing. HEENT: Normal. Pupils equal.  Neck: Supple. No mass. No thyroid mass. Scar in left neck from neck surgery. Lymph Nodes: No supraclavicular, cervical or axillary nodes. she does have a fullness in both axilla that seems symmetric.  Lungs: Clear to auscultation and symmetric breath sounds. Heart: RRR. No murmur or rub.  Breast: Right - bruise at 9 o'clock. I can not feel a mass.  Left - No mass  Abdomen: Soft. No mass. No tenderness. No hernia. Normal bowel sounds. Scars from gall bladder, band, and appedectomy. Lap band in the RUQ.  Extremities: Good strength and ROM in upper and lower extremities.  Neurologic: Grossly intact to motor and sensory function. Psychiatric: Has normal mood and affect. Behavior is normal.  Assessment & Plan  1.  BREAST CANCER, STAGE 1, RIGHT (C50.911)  Story: Note there are two tumors that are adjacent:   She had a right breast biopsy on 11/28/2014 ([id number]) which showed 10 o'clock (9 cm from nipple) IDC, ER - 100%, PR - 85%, Ki67 - 10%, Her2Neu - neg  and 10 o'clock (7 cm from nipple) IDC that is ER - 100%, PR - 0%, Ki67 - 5%, Her2Neu - neg.   Treating oncology - Magrinat and Pablo Ledger  Plan:   1) right breast lumpectomy x 2 (1 incision - wire vs needle loc) and right axillary SLNBx,   [I spoke to [employer] and both areas need loc - I will go with 2 wires]    2) Oncotype,   3) rad tx,   4) Anti estrogen tx  2. Polycystic ovary disease 3. Lap band Hassell Done  Surgery - 11/16/2006. She has maintained 40 pound weight loss 4. HTN 5. Hypercholesterolemia 6. Getting colonoscopy Monday, 12/17/2014, at [facility] 7. Neck surgery Disckectomy and fusion - C5-6 - Hirsch - 07/11/2002 8.  Lap appendectomy - 08/06/2012 - Marlana Latus, MD, [facility] Pager: [phone] Office phone:  [phone]

## 2014-12-28 ENCOUNTER — Ambulatory Visit (HOSPITAL_BASED_OUTPATIENT_CLINIC_OR_DEPARTMENT_OTHER): Payer: 59 | Admitting: Certified Registered"

## 2014-12-28 ENCOUNTER — Encounter (HOSPITAL_BASED_OUTPATIENT_CLINIC_OR_DEPARTMENT_OTHER): Payer: Self-pay | Admitting: *Deleted

## 2014-12-28 ENCOUNTER — Ambulatory Visit
Admission: RE | Admit: 2014-12-28 | Discharge: 2014-12-28 | Disposition: A | Discharge status: Home / Self Care | Payer: 59 | Source: Ambulatory Visit | Attending: Surgery | Admitting: Surgery

## 2014-12-28 ENCOUNTER — Encounter (HOSPITAL_COMMUNITY)
Admission: RE | Admit: 2014-12-28 | Discharge: 2014-12-28 | Disposition: A | Discharge status: Home / Self Care | Payer: 59 | Source: Ambulatory Visit | Attending: Surgery | Admitting: Surgery

## 2014-12-28 ENCOUNTER — Encounter (HOSPITAL_BASED_OUTPATIENT_CLINIC_OR_DEPARTMENT_OTHER)
Admission: RE | Disposition: A | Discharge status: Home / Self Care | Payer: Self-pay | Source: Ambulatory Visit | Attending: Surgery

## 2014-12-28 ENCOUNTER — Ambulatory Visit (HOSPITAL_BASED_OUTPATIENT_CLINIC_OR_DEPARTMENT_OTHER)
Admission: RE | Admit: 2014-12-28 | Discharge: 2014-12-28 | Disposition: A | Discharge status: Home / Self Care | Payer: 59 | Source: Ambulatory Visit | Attending: Surgery | Admitting: Surgery

## 2014-12-28 DIAGNOSIS — C50411 Malignant neoplasm of upper-outer quadrant of right female breast: Secondary | ICD-10-CM | POA: Insufficient documentation

## 2014-12-28 DIAGNOSIS — C50911 Malignant neoplasm of unspecified site of right female breast: Secondary | ICD-10-CM

## 2014-12-28 DIAGNOSIS — E7800 Pure hypercholesterolemia, unspecified: Secondary | ICD-10-CM | POA: Insufficient documentation

## 2014-12-28 DIAGNOSIS — I129 Hypertensive chronic kidney disease with stage 1 through stage 4 chronic kidney disease, or unspecified chronic kidney disease: Secondary | ICD-10-CM | POA: Insufficient documentation

## 2014-12-28 DIAGNOSIS — K219 Gastro-esophageal reflux disease without esophagitis: Secondary | ICD-10-CM | POA: Insufficient documentation

## 2014-12-28 DIAGNOSIS — E1122 Type 2 diabetes mellitus with diabetic chronic kidney disease: Secondary | ICD-10-CM | POA: Insufficient documentation

## 2014-12-28 DIAGNOSIS — Z170 Estrogen receptor positive status [ER+]: Secondary | ICD-10-CM | POA: Insufficient documentation

## 2014-12-28 DIAGNOSIS — F419 Anxiety disorder, unspecified: Secondary | ICD-10-CM | POA: Insufficient documentation

## 2014-12-28 DIAGNOSIS — E282 Polycystic ovarian syndrome: Secondary | ICD-10-CM | POA: Insufficient documentation

## 2014-12-28 DIAGNOSIS — Z9884 Bariatric surgery status: Secondary | ICD-10-CM | POA: Insufficient documentation

## 2014-12-28 DIAGNOSIS — N62 Hypertrophy of breast: Secondary | ICD-10-CM | POA: Insufficient documentation

## 2014-12-28 DIAGNOSIS — N189 Chronic kidney disease, unspecified: Secondary | ICD-10-CM | POA: Insufficient documentation

## 2014-12-28 DIAGNOSIS — Z9049 Acquired absence of other specified parts of digestive tract: Secondary | ICD-10-CM | POA: Insufficient documentation

## 2014-12-28 DIAGNOSIS — Z79899 Other long term (current) drug therapy: Secondary | ICD-10-CM | POA: Insufficient documentation

## 2014-12-28 HISTORY — PX: BREAST LUMPECTOMY WITH NEEDLE LOCALIZATION AND AXILLARY SENTINEL LYMPH NODE BX: SHX5760

## 2014-12-28 LAB — GLUCOSE, CAPILLARY: GLUCOSE-CAPILLARY: 87 mg/dL (ref 65–99)

## 2014-12-28 SURGERY — BREAST LUMPECTOMY WITH NEEDLE LOCALIZATION AND AXILLARY SENTINEL LYMPH NODE BX
Anesthesia: Regional | Laterality: Right

## 2014-12-28 MED ORDER — TECHNETIUM TC 99M SULFUR COLLOID FILTERED
1.0000 | Freq: Once | INTRAVENOUS | Status: AC | PRN
  Administered 2014-12-28: 1 via INTRADERMAL

## 2014-12-28 MED ORDER — PHENYLEPHRINE HCL 10 MG/ML IJ SOLN
INTRAMUSCULAR | Status: DC | PRN
  Administered 2014-12-28 (×3): 40 ug via INTRAVENOUS

## 2014-12-28 MED ORDER — HYDROMORPHONE HCL 1 MG/ML IJ SOLN
0.2500 mg | INTRAMUSCULAR | Status: DC | PRN
  Administered 2014-12-28: 0.5 mg via INTRAVENOUS

## 2014-12-28 MED ORDER — CEFAZOLIN SODIUM-DEXTROSE 2-3 GM-% IV SOLR
INTRAVENOUS | Status: AC
  Filled 2014-12-28: qty 50

## 2014-12-28 MED ORDER — LACTATED RINGERS IV SOLN
INTRAVENOUS | Status: DC
  Administered 2014-12-28 (×3): via INTRAVENOUS

## 2014-12-28 MED ORDER — FENTANYL CITRATE (PF) 100 MCG/2ML IJ SOLN
INTRAMUSCULAR | Status: AC
  Filled 2014-12-28: qty 2

## 2014-12-28 MED ORDER — EPHEDRINE SULFATE 50 MG/ML IJ SOLN
INTRAMUSCULAR | Status: AC
  Filled 2014-12-28: qty 1

## 2014-12-28 MED ORDER — DEXAMETHASONE SODIUM PHOSPHATE 4 MG/ML IJ SOLN
INTRAMUSCULAR | Status: DC | PRN
  Administered 2014-12-28: 4 mg via INTRAVENOUS

## 2014-12-28 MED ORDER — PHENYLEPHRINE 40 MCG/ML (10ML) SYRINGE FOR IV PUSH (FOR BLOOD PRESSURE SUPPORT)
PREFILLED_SYRINGE | INTRAVENOUS | Status: AC
  Filled 2014-12-28: qty 10

## 2014-12-28 MED ORDER — HYDROCODONE-ACETAMINOPHEN 5-325 MG PO TABS: 1.0000 | ORAL_TABLET | Freq: Four times a day (QID) | ORAL | Status: DC | PRN

## 2014-12-28 MED ORDER — PROPOFOL 500 MG/50ML IV EMUL
INTRAVENOUS | Status: AC
  Filled 2014-12-28: qty 50

## 2014-12-28 MED ORDER — DEXAMETHASONE SODIUM PHOSPHATE 10 MG/ML IJ SOLN
INTRAMUSCULAR | Status: AC
  Filled 2014-12-28: qty 1

## 2014-12-28 MED ORDER — HYDROMORPHONE HCL 1 MG/ML IJ SOLN
INTRAMUSCULAR | Status: AC
  Filled 2014-12-28: qty 1

## 2014-12-28 MED ORDER — EPHEDRINE SULFATE 50 MG/ML IJ SOLN
INTRAMUSCULAR | Status: DC | PRN
  Administered 2014-12-28 (×2): 10 mg via INTRAVENOUS

## 2014-12-28 MED ORDER — CEFAZOLIN SODIUM-DEXTROSE 2-3 GM-% IV SOLR
2.0000 g | INTRAVENOUS | Status: AC
  Administered 2014-12-28: 2 g via INTRAVENOUS

## 2014-12-28 MED ORDER — GLYCOPYRROLATE 0.2 MG/ML IJ SOLN
0.2000 mg | Freq: Once | INTRAMUSCULAR | Status: AC | PRN
  Administered 2014-12-28: 0.4 mg via INTRAVENOUS

## 2014-12-28 MED ORDER — MIDAZOLAM HCL 2 MG/2ML IJ SOLN
INTRAMUSCULAR | Status: AC
  Filled 2014-12-28: qty 2

## 2014-12-28 MED ORDER — ONDANSETRON HCL 4 MG/2ML IJ SOLN
INTRAMUSCULAR | Status: AC
  Filled 2014-12-28: qty 2

## 2014-12-28 MED ORDER — BUPIVACAINE-EPINEPHRINE (PF) 0.5% -1:200000 IJ SOLN
INTRAMUSCULAR | Status: DC | PRN
  Administered 2014-12-28: 30 mL

## 2014-12-28 MED ORDER — CHLORHEXIDINE GLUCONATE 4 % EX LIQD: 1.0000 "application " | Freq: Once | CUTANEOUS | Status: DC

## 2014-12-28 MED ORDER — SCOPOLAMINE 1 MG/3DAYS TD PT72: 1.0000 | MEDICATED_PATCH | Freq: Once | TRANSDERMAL | Status: DC

## 2014-12-28 MED ORDER — 0.9 % SODIUM CHLORIDE (POUR BTL) OPTIME
TOPICAL | Status: DC | PRN
  Administered 2014-12-28: 2000 mL

## 2014-12-28 MED ORDER — BUPIVACAINE-EPINEPHRINE (PF) 0.25% -1:200000 IJ SOLN
INTRAMUSCULAR | Status: DC | PRN
  Administered 2014-12-28: 15 mL

## 2014-12-28 MED ORDER — FENTANYL CITRATE (PF) 100 MCG/2ML IJ SOLN
50.0000 ug | INTRAMUSCULAR | Status: AC | PRN
  Administered 2014-12-28 (×2): 25 ug via INTRAVENOUS
  Administered 2014-12-28: 50 ug via INTRAVENOUS
  Administered 2014-12-28: 100 ug via INTRAVENOUS

## 2014-12-28 MED ORDER — LIDOCAINE HCL (CARDIAC) 20 MG/ML IV SOLN
INTRAVENOUS | Status: AC
  Filled 2014-12-28: qty 5

## 2014-12-28 MED ORDER — LIDOCAINE HCL (CARDIAC) 20 MG/ML IV SOLN
INTRAVENOUS | Status: DC | PRN
  Administered 2014-12-28: 30 mg via INTRAVENOUS

## 2014-12-28 MED ORDER — PROPOFOL 10 MG/ML IV BOLUS
INTRAVENOUS | Status: DC | PRN
  Administered 2014-12-28: 120 mg via INTRAVENOUS

## 2014-12-28 MED ORDER — ONDANSETRON HCL 4 MG/2ML IJ SOLN
INTRAMUSCULAR | Status: DC | PRN
  Administered 2014-12-28: 4 mg via INTRAVENOUS

## 2014-12-28 MED ORDER — MIDAZOLAM HCL 2 MG/2ML IJ SOLN
1.0000 mg | INTRAMUSCULAR | Status: DC | PRN
  Administered 2014-12-28: 1 mg via INTRAVENOUS

## 2014-12-28 MED ORDER — SODIUM CHLORIDE 0.9 % IJ SOLN
INTRAMUSCULAR | Status: DC | PRN
  Administered 2014-12-28: 5 mL

## 2014-12-28 SURGICAL SUPPLY — 59 items
APPLIER CLIP 11 MED OPEN (CLIP)
APR CLP MED 11 20 MLT OPN (CLIP)
BINDER BREAST LRG (GAUZE/BANDAGES/DRESSINGS) IMPLANT
BINDER BREAST MEDIUM (GAUZE/BANDAGES/DRESSINGS) IMPLANT
BINDER BREAST XLRG (GAUZE/BANDAGES/DRESSINGS) ×1 IMPLANT
BINDER BREAST XXLRG (GAUZE/BANDAGES/DRESSINGS) IMPLANT
BLADE SURG 10 STRL SS (BLADE) ×2 IMPLANT
BLADE SURG 15 STRL LF DISP TIS (BLADE) ×1 IMPLANT
BLADE SURG 15 STRL SS (BLADE) ×2
CANISTER SUCT 1200ML W/VALVE (MISCELLANEOUS) ×3 IMPLANT
CHLORAPREP W/TINT 26ML (MISCELLANEOUS) ×2 IMPLANT
CLIP APPLIE 11 MED OPEN (CLIP) IMPLANT
CLIP TI WIDE RED SMALL 6 (CLIP) ×2 IMPLANT
COVER BACK TABLE 60X90IN (DRAPES) ×2 IMPLANT
COVER MAYO STAND STRL (DRAPES) ×2 IMPLANT
COVER PROBE W GEL 5X96 (DRAPES) ×2 IMPLANT
DECANTER SPIKE VIAL GLASS SM (MISCELLANEOUS) IMPLANT
DEVICE DUBIN W/COMP PLATE 8390 (MISCELLANEOUS) ×2 IMPLANT
DRAPE LAPAROSCOPIC ABDOMINAL (DRAPES) ×2 IMPLANT
DRAPE UTILITY XL STRL (DRAPES) ×2 IMPLANT
ELECT COATED BLADE 2.86 ST (ELECTRODE) ×2 IMPLANT
ELECT REM PT RETURN 9FT ADLT (ELECTROSURGICAL) ×2
ELECTRODE REM PT RTRN 9FT ADLT (ELECTROSURGICAL) ×1 IMPLANT
GAUZE SPONGE 4X4 12PLY STRL (GAUZE/BANDAGES/DRESSINGS) ×1 IMPLANT
GLOVE BIOGEL PI IND STRL 7.0 (GLOVE) IMPLANT
GLOVE BIOGEL PI IND STRL 8 (GLOVE) IMPLANT
GLOVE BIOGEL PI INDICATOR 7.0 (GLOVE) ×1
GLOVE BIOGEL PI INDICATOR 8 (GLOVE) ×1
GLOVE ECLIPSE 6.5 STRL STRAW (GLOVE) ×1 IMPLANT
GLOVE EXAM NITRILE MD LF STRL (GLOVE) ×1 IMPLANT
GLOVE SURG SIGNA 7.5 PF LTX (GLOVE) ×2 IMPLANT
GLOVE SURG SS PI 8.0 STRL IVOR (GLOVE) ×1 IMPLANT
GOWN STRL REUS W/ TWL LRG LVL3 (GOWN DISPOSABLE) ×1 IMPLANT
GOWN STRL REUS W/ TWL XL LVL3 (GOWN DISPOSABLE) ×1 IMPLANT
GOWN STRL REUS W/TWL LRG LVL3 (GOWN DISPOSABLE) ×2
GOWN STRL REUS W/TWL XL LVL3 (GOWN DISPOSABLE) ×4
KIT MARKER MARGIN INK (KITS) IMPLANT
LIQUID BAND (GAUZE/BANDAGES/DRESSINGS) ×2 IMPLANT
NDL HYPO 25X1 1.5 SAFETY (NEEDLE) ×1 IMPLANT
NDL SAFETY ECLIPSE 18X1.5 (NEEDLE) IMPLANT
NEEDLE HYPO 18GX1.5 SHARP (NEEDLE) ×2
NEEDLE HYPO 25X1 1.5 SAFETY (NEEDLE) ×4 IMPLANT
NS IRRIG 1000ML POUR BTL (IV SOLUTION) ×3 IMPLANT
PACK BASIN DAY SURGERY FS (CUSTOM PROCEDURE TRAY) ×2 IMPLANT
PAD ALCOHOL SWAB (MISCELLANEOUS) ×1 IMPLANT
PENCIL BUTTON HOLSTER BLD 10FT (ELECTRODE) ×2 IMPLANT
SHEET MEDIUM DRAPE 40X70 STRL (DRAPES) ×2 IMPLANT
SLEEVE SCD COMPRESS KNEE MED (MISCELLANEOUS) ×2 IMPLANT
SPONGE GAUZE 4X4 12PLY STER LF (GAUZE/BANDAGES/DRESSINGS) IMPLANT
SPONGE LAP 18X18 X RAY DECT (DISPOSABLE) ×3 IMPLANT
SUT MON AB 5-0 PS2 18 (SUTURE) ×2 IMPLANT
SUT SILK 3 0 TIES 17X18 (SUTURE)
SUT SILK 3-0 18XBRD TIE BLK (SUTURE) IMPLANT
SUT VICRYL 3-0 CR8 SH (SUTURE) ×2 IMPLANT
SYR CONTROL 10ML LL (SYRINGE) ×4 IMPLANT
TOWEL OR 17X24 6PK STRL BLUE (TOWEL DISPOSABLE) ×2 IMPLANT
TOWEL OR NON WOVEN STRL DISP B (DISPOSABLE) ×2 IMPLANT
TUBE CONNECTING 20X1/4 (TUBING) ×2 IMPLANT
YANKAUER SUCT BULB TIP NO VENT (SUCTIONS) ×2 IMPLANT

## 2014-12-28 NOTE — Interval H&P Note (Signed)
History and Physical Interval Note:  12/28/2014 12:19 PM  Taylor Oconnor  has presented today for surgery, with the diagnosis of Two cancers of right breast  The various methods of treatment have been discussed with the patient and family.   Her husband is with her.  After consideration of risks, benefits and other options for treatment, the patient has consented to  Procedure(s): RIGHT BREAST LUMPECTOMY WITH TWO (2) NEEDLE LOCALIZATION AND AXILLARY SENTINEL LYMPH NODE BX (Right) as a surgical intervention .  The patient's history has been reviewed, patient examined, no change in status, stable for surgery.  I have reviewed the patient's chart and labs.  Questions were answered to the patient's satisfaction.     Vergene Marland H

## 2014-12-28 NOTE — Progress Notes (Signed)
Assisted Dr. Edmond Fitzgerald with right, ultrasound guided, pectoralis block. Side rails up, monitors on throughout procedure. See vital signs in flow sheet. Tolerated Procedure well. 

## 2014-12-28 NOTE — Discharge Instructions (Signed)
[facility] [place] [facility] - DISCHARGE INSTRUCTIONS TO PATIENT  Activity:  Driving - May drive in 2 or 3 days, if doing well   Lifting - No lifting more than 15 pounds for 1 week, then no limit  Wound Care:   Leave the wound dry for 2 days, then you may shower  Diet:  As tolerated.  Follow up appointment:  Call Dr. Pollie Friar office [facility]) at [phone] for an appointment in 2 weeks.  Medications and dosages: Post Anesthesia Home Care Instructions  Activity: Get plenty of rest for the remainder of the day. A responsible adult should stay with you for 24 hours following the procedure.  For the next 24 hours, DO NOT: -Drive a car -Paediatric nurse -Drink alcoholic beverages -Take any medication unless instructed by your physician -Make any legal decisions or sign important papers.  Meals: Start with liquid foods such as gelatin or soup. Progress to regular foods as tolerated. Avoid greasy, spicy, heavy foods. If nausea and/or vomiting occur, drink only clear liquids until the nausea and/or vomiting subsides. Call your physician if vomiting continues.  Special Instructions/Symptoms: Your throat may feel dry or sore from the anesthesia or the breathing tube placed in your throat during surgery. If this causes discomfort, gargle with warm salt water. The discomfort should disappear within 24 hours.  If you had a scopolamine patch placed behind your ear for the management of post- operative nausea and/or vomiting:  1. The medication in the patch is effective for 72 hours, after which it should be removed.  Wrap patch in a tissue and discard in the trash. Wash hands thoroughly with soap and water. 2. You may remove the patch earlier than 72 hours if you experience unpleasant side effects which may include dry mouth, dizziness or visual disturbances. 3. Avoid touching the patch. Wash your hands with soap and water after contact with the patch.     Resume your home  medications.  You have a prescription for:  Vicodin  Call Dr. Lucia Gaskins or his office  [phone]) if you have:  Temperature greater than 100.4,  Redness, tenderness, or signs of infection (pain, swelling, redness, odor or green/yellow discharge around the site),  Any other questions or concerns you may have after discharge.  In an emergency, call 911 or go to an Emergency Department at a nearby hospital.

## 2014-12-28 NOTE — Anesthesia Preprocedure Evaluation (Addendum)
Anesthesia Evaluation  Patient identified by MRN, date of birth, ID band Patient awake    Reviewed: Allergy & Precautions, H&P , NPO status , Patient's Chart, lab work & pertinent test results, reviewed documented beta blocker date and time   History of Anesthesia Complications (+) DIFFICULT AIRWAY and history of anesthetic complications  Airway Mallampati: II  TM Distance: >3 FB Neck ROM: Full    Dental no notable dental hx. (+) Teeth Intact, Dental Advisory Given   Pulmonary neg pulmonary ROS,    Pulmonary exam normal breath sounds clear to auscultation       Cardiovascular hypertension, Pt. on home beta blockers  Rhythm:Regular Rate:Normal     Neuro/Psych PSYCHIATRIC DISORDERS Anxiety negative neurological ROS     GI/Hepatic Neg liver ROS, GERD  Medicated,  Endo/Other  diabetes, Type 2  Renal/GU Renal InsufficiencyRenal disease  negative genitourinary   Musculoskeletal   Abdominal   Peds  Hematology negative hematology ROS (+)   Anesthesia Other Findings   Reproductive/Obstetrics negative OB ROS                           Anesthesia Physical Anesthesia Plan  ASA: III  Anesthesia Plan: General and Regional   Post-op Pain Management: GA combined w/ Regional for post-op pain   Induction: Intravenous  Airway Management Planned: LMA  Additional Equipment:   Intra-op Plan:   Post-operative Plan: Extubation in OR  Informed Consent: I have reviewed the patients History and Physical, chart, labs and discussed the procedure including the risks, benefits and alternatives for the proposed anesthesia with the patient or authorized representative who has indicated his/her understanding and acceptance.   Dental advisory given  Plan Discussed with: CRNA  Anesthesia Plan Comments:         Anesthesia Quick Evaluation

## 2014-12-28 NOTE — Op Note (Signed)
12/28/2014  2:17 PM  PATIENT:  Taylor Oconnor DOB: 06-12-1950 MRN: [id number]  PREOP DIAGNOSIS:  Two cancers of right breast  POSTOP DIAGNOSIS:   Two cancers of right breast, 10 o'clock position (T1, N0)  PROCEDURE:   Procedure(s): RIGHT BREAST LUMPECTOMY WITH TWO (2) NEEDLE LOCALIZATION AND AXILLARY SENTINEL LYMPH NODE BX, Injection of peri areolar area of breast with methylene blue (1.0 cc)  Both tumors and right axillary node biopsy done through single incision  SURGEON:   Alphonsa Overall, M.D.  ANESTHESIA:   general  Anesthesiologist: Roderic Palau, MD CRNA: Ernesta Amble Blocker, CRNA  General  EBL:  75  ml  DRAINS: none   LOCAL MEDICATIONS USED:   15 cc 1/4% marcaine, she had a right pectoral block  SPECIMEN:   Right breast lumpectomy with 2 wires, right submammary biopsy, right axillary fat, and right axillary sentinel lymph node (counts 60, background 0, slightly blue)  COUNTS CORRECT:  YES  INDICATIONS FOR PROCEDURE:  Taylor Oconnor is a 64 y.o. (DOB: 1950/05/13) white  female whose primary care physician is MCKEOWN,WILLIAM Treyson Axel, MD and comes for right breast lumpectomy x 2 and right axillary sentinel lymph node biopsy.   She was originally seen at the [facility] with Drs. Magrinat and Pablo Ledger.  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  These are close enough together, they appear amendable to excising through a single lumpectomy.   Options for breast cancer treatment were discussed with the patient. She elected to proceed with lumpectomy and axillary sentinel lymph node.     The indications and potential complications of surgery were explained to the patient. Potential complications include, but are not limited to, bleeding, infection, the need for further surgery, and nerve injury.     The patient had two needle loc wires placed at the 9 o'clock position of the right breast at [facility].  In the holding area, her  right areola was injected with 1 millicurie of Technitium Sulfur Colloid.  She also had a right pectoral block in the holding area.  OPERATIVE NOTE:   The patient was taken to room # 8 at [facility] where she underwent a general anesthesia  supervised by Anesthesiologist: Roderic Palau, MD CRNA: Ernesta Amble Blocker, CRNA. Her right breast and axilla were prepped with  ChloraPrep and sterilely draped.    A time-out and the surgical check list was reviewed.    I injected about 0.5 mL of 40% methylene blue around her right areola.   Because she had two wires in the right breast, I did the right lumpectomy first.  The two wires came out at the 9 o'clock position of the right breast. I cut down around the wire and tried to take an ellipse of breast tissue around the tumor by at least 1 cm.  I did this through a single incision.  I excised this block of breast tissue approximately 7 cm by 8 cm  in diameter. I did take the dissection down to the pectoralis major. I painted the lumpectomy specimen with the 6 color paint kit and did a specimen mammogram which confirmed the mass, clip and the wire were all in the right position.    Through the lumpectomy incision, I explored the right axillary for the sentinel lymph node.  She had an area that was submammary that was warm.  I removed this and sent it as submammary tissue.  I removed a  piece of fat from the axilla that was cold and not blue.  I sent this as right axillary tissue  I found a hot area at the junction of the breast and the pectoralis major muscle. I cut down and  identified a hot node that had counts of 60 and the background has 0 counts. The lymph node was faintly blue. I checked her internal mammary nodes and supraclavicular nodes with the neoprobe and found no other hot area. The axillary node was then sent to pathology.  The other two specimens were also sent.   I then irrigated the wound with saline. I infiltrated approximately 30 mL of  1% local between the  Incisions.  I placed 6 clips to mark biopsy cavity, at 12, 3, 6, and 9 o'clock. Two clips were placed on the pectoralis major.   I then closed all the wounds in layers using 3-0 Vicryl sutures for the deep layer. At the skin, I closed the incisions with a 4-0 Monocryl suture. The incision was then painted with LiquidBand.  She had gauze place over the wounds and placed in a breast binder.   The patient tolerated the procedure well, was transported to the recovery room in good condition. Sponge and needle count were correct at the end of the case.   Final pathology is pending.   Alphonsa Overall, MD, [facility] Pager: [phone] Office phone:  [phone]

## 2014-12-28 NOTE — Anesthesia Procedure Notes (Addendum)
Anesthesia Regional Block:  Pectoralis block  Pre-Anesthetic Checklist: ,, timeout performed, Correct Patient, Correct Site, Correct Laterality, Correct Procedure, Correct Position, site marked, Risks and benefits discussed, pre-op evaluation, post-op pain management  Laterality: Right  Prep: Maximum Sterile Barrier Precautions used and chloraprep       Needles:  Injection technique: Single-shot  Needle Type: Echogenic Stimulator Needle     Needle Length: 9cm 9 cm Needle Gauge: 21 and 21 G    Additional Needles:  Procedures: ultrasound guided (picture in chart) Pectoralis block Narrative:  Start time: 12/28/2014 11:12 AM End time: 12/28/2014 11:22 AM Injection made incrementally with aspirations every 5 mL. Anesthesiologist: Roderic Palau  Additional Notes: 2% Lidocaine skin wheel.   Procedure Name: LMA Insertion Date/Time: 12/28/2014 12:42 PM Performed by: Marquese Burkland D Pre-anesthesia Checklist: Patient identified, Emergency Drugs available, Suction available and Patient being monitored Patient Re-evaluated:Patient Re-evaluated prior to inductionOxygen Delivery Method: Circle System Utilized Preoxygenation: Pre-oxygenation with 100% oxygen Intubation Type: IV induction Ventilation: Mask ventilation without difficulty LMA: LMA inserted LMA Size: 4.0 Number of attempts: 1 Airway Equipment and Method: Bite block Placement Confirmation: positive ETCO2 Tube secured with: Tape Dental Injury: Teeth and Oropharynx as per pre-operative assessment     Procedure Name: LMA Insertion Date/Time: 12/28/2014 12:40 PM Performed by: Byrd Rushlow D Pre-anesthesia Checklist: Patient identified, Emergency Drugs available, Suction available and Patient being monitored Patient Re-evaluated:Patient Re-evaluated prior to inductionOxygen Delivery Method: Circle System Utilized Preoxygenation: Pre-oxygenation with 100% oxygen Intubation Type: IV induction Ventilation:  Mask ventilation without difficulty LMA: LMA inserted LMA Size: 4.0 Number of attempts: 1 Airway Equipment and Method: Bite block Placement Confirmation: positive ETCO2 Tube secured with: Tape Dental Injury: Teeth and Oropharynx as per pre-operative assessment

## 2014-12-28 NOTE — Anesthesia Postprocedure Evaluation (Signed)
Anesthesia Post Note  Patient: Taylor Oconnor  Procedure(s) Performed: Procedure(s) (LRB): RIGHT BREAST LUMPECTOMY WITH TWO (2) NEEDLE LOCALIZATION AND AXILLARY SENTINEL LYMPH NODE BX (Right)  Patient location during evaluation: PACU Anesthesia Type: General and Regional Level of consciousness: awake and alert Pain management: pain level controlled Vital Signs Assessment: post-procedure vital signs reviewed and stable Respiratory status: spontaneous breathing, nonlabored ventilation and respiratory function stable Cardiovascular status: blood pressure returned to baseline and stable Postop Assessment: no signs of nausea or vomiting Anesthetic complications: no    Last Vitals:  Filed Vitals:   12/28/14 1515 12/28/14 1600  BP: 129/74 150/81  Pulse: 85 79  Temp:  36.6 C  Resp: 16 16    Last Pain:  Filed Vitals:   12/28/14 1608  PainSc: 1                  Jaxsyn Azam,W. EDMOND

## 2014-12-28 NOTE — Transfer of Care (Signed)
Immediate Anesthesia Transfer of Care Note  Patient: Taylor Oconnor  Procedure(s) Performed: Procedure(s): RIGHT BREAST LUMPECTOMY WITH TWO (2) NEEDLE LOCALIZATION AND AXILLARY SENTINEL LYMPH NODE BX (Right)  Patient Location: PACU  Anesthesia Type:GA combined with regional for post-op pain  Level of Consciousness: awake, alert , oriented and patient cooperative  Airway & Oxygen Therapy: Patient Spontanous Breathing and Patient connected to face mask oxygen  Post-op Assessment: Report given to RN and Post -op Vital signs reviewed and stable  Post vital signs: Reviewed and stable  Last Vitals:  Filed Vitals:   12/28/14 1100 12/28/14 1115  BP: 131/60 130/60  Pulse:  47  Temp:    Resp:  10    Complications: No apparent anesthesia complications

## 2014-12-31 ENCOUNTER — Encounter (HOSPITAL_BASED_OUTPATIENT_CLINIC_OR_DEPARTMENT_OTHER): Payer: Self-pay | Admitting: Surgery

## 2015-01-01 ENCOUNTER — Telehealth: Payer: Self-pay | Admitting: *Deleted

## 2015-01-01 NOTE — Telephone Encounter (Signed)
Received order per Dr. Magrinat for oncotype testing. Requisition sent to pathology. Received by Christy. 

## 2015-01-03 ENCOUNTER — Encounter: Payer: Self-pay | Admitting: Internal Medicine

## 2015-01-09 ENCOUNTER — Encounter (HOSPITAL_COMMUNITY): Payer: Self-pay

## 2015-01-11 ENCOUNTER — Telehealth: Payer: Self-pay | Admitting: *Deleted

## 2015-01-11 NOTE — Telephone Encounter (Signed)
-----   Message from Rockwell Germany, RN sent at 12/25/2014  8:55 AM EST ----- Regarding: Care Plan Patient was seen in [facility] 11/30.  She is scheduled for the following.  Surgery - 12/16 - Lumpectomy with [facility] Dr. [name] - 2/15  An oncotype will be sent pending final pathology.  Thanks,  Varney Biles

## 2015-01-15 ENCOUNTER — Telehealth: Payer: Self-pay | Admitting: *Deleted

## 2015-01-15 NOTE — Telephone Encounter (Signed)
Received oncotype score of 16. Left vm for pt to return call to discuss results. Referral made to for Dr. Pablo Ledger.

## 2015-01-16 ENCOUNTER — Encounter: Payer: Self-pay | Admitting: *Deleted

## 2015-01-18 ENCOUNTER — Ambulatory Visit
Admission: RE | Admit: 2015-01-18 | Discharge: 2015-01-18 | Disposition: A | Discharge status: Home / Self Care | Payer: 59 | Source: Ambulatory Visit | Attending: Oncology | Admitting: Oncology

## 2015-01-18 DIAGNOSIS — M8580 Other specified disorders of bone density and structure, unspecified site: Secondary | ICD-10-CM

## 2015-01-24 ENCOUNTER — Encounter: Payer: Self-pay | Admitting: Internal Medicine

## 2015-01-24 ENCOUNTER — Ambulatory Visit (INDEPENDENT_AMBULATORY_CARE_PROVIDER_SITE_OTHER): Payer: 59 | Admitting: Internal Medicine

## 2015-01-24 VITALS — BP 116/80 | HR 64 | Temp 97.0°F | Resp 16 | Ht 63.5 in | Wt 185.6 lb

## 2015-01-24 DIAGNOSIS — E1122 Type 2 diabetes mellitus with diabetic chronic kidney disease: Secondary | ICD-10-CM

## 2015-01-24 DIAGNOSIS — Z111 Encounter for screening for respiratory tuberculosis: Secondary | ICD-10-CM | POA: Diagnosis not present

## 2015-01-24 DIAGNOSIS — Z0000 Encounter for general adult medical examination without abnormal findings: Secondary | ICD-10-CM | POA: Diagnosis not present

## 2015-01-24 DIAGNOSIS — Z79899 Other long term (current) drug therapy: Secondary | ICD-10-CM

## 2015-01-24 DIAGNOSIS — E559 Vitamin D deficiency, unspecified: Secondary | ICD-10-CM

## 2015-01-24 DIAGNOSIS — R5383 Other fatigue: Secondary | ICD-10-CM

## 2015-01-24 DIAGNOSIS — Z1212 Encounter for screening for malignant neoplasm of rectum: Secondary | ICD-10-CM

## 2015-01-24 DIAGNOSIS — I10 Essential (primary) hypertension: Secondary | ICD-10-CM | POA: Diagnosis not present

## 2015-01-24 DIAGNOSIS — E782 Mixed hyperlipidemia: Secondary | ICD-10-CM

## 2015-01-24 DIAGNOSIS — Z0001 Encounter for general adult medical examination with abnormal findings: Secondary | ICD-10-CM

## 2015-01-24 DIAGNOSIS — N182 Chronic kidney disease, stage 2 (mild): Secondary | ICD-10-CM

## 2015-01-24 LAB — CBC WITH DIFFERENTIAL/PLATELET
BASOS PCT: 0 % (ref 0–1)
Basophils Absolute: 0 10*3/uL (ref 0.0–0.1)
EOS ABS: 0.2 10*3/uL (ref 0.0–0.7)
Eosinophils Relative: 3 % (ref 0–5)
HCT: 40.1 % (ref 36.0–46.0)
HEMOGLOBIN: 13.4 g/dL (ref 12.0–15.0)
Lymphocytes Relative: 49 % — ABNORMAL HIGH (ref 12–46)
Lymphs Abs: 3.6 10*3/uL (ref 0.7–4.0)
MCH: 30.5 pg (ref 26.0–34.0)
MCHC: 33.4 g/dL (ref 30.0–36.0)
MCV: 91.1 fL (ref 78.0–100.0)
MONO ABS: 0.4 10*3/uL (ref 0.1–1.0)
MPV: 8.7 fL (ref 8.6–12.4)
Monocytes Relative: 6 % (ref 3–12)
Neutro Abs: 3.1 10*3/uL (ref 1.7–7.7)
Neutrophils Relative %: 42 % — ABNORMAL LOW (ref 43–77)
PLATELETS: 460 10*3/uL — AB (ref 150–400)
RBC: 4.4 MIL/uL (ref 3.87–5.11)
RDW: 14.3 % (ref 11.5–15.5)
WBC: 7.3 10*3/uL (ref 4.0–10.5)

## 2015-01-24 NOTE — Progress Notes (Signed)
Patient ID: Taylor Oconnor, female   DOB: Sep 06, 1950, 65 y.o.   MRN: [id number]  Annual Screening/Preventative Visit And Comprehensive Evaluation &  Examination     This very nice 65 y.o. MWF presents for presents for a Wellness/Preventative Visit & comprehensive evaluation and management of multiple medical co-morbidities.  Patient has been followed for HTN, T2_NIDDM, Hyperlipidemia and Vitamin D Deficiency.      HTN predates since 1995. Patient's BP has been controlled at home and patient denies any cardiac symptoms as chest pain, palpitations, shortness of breath, dizziness or ankle swelling. Today's BP: 116/80 mmHg      Patient's hyperlipidemia is controlled with diet and medications. Patient denies myalgias or other medication SE's. Last lipids were Cholesterol 185; HDL 70; LDL 99; Triglycerides 79 on 10/15/2014.      Patient has hx/o Morbid Obesity (BMI 32.36) and consequent preDM in 2008 and transitioning to T2_NIDDM in 2012 and has attempted to manage with diet with FBG's ranging betw 90-120 mg% . Patient did undergo Lap Band surg in 2008 dropping weight from ~ 230 # to her current 185 #.  Patient denies reactive hypoglycemic symptoms, visual blurring, diabetic polys, or paresthesias. Last A1c was  6.2% on 10/15/2014.     Finally, patient has history of Vitamin D Deficiency of "28" in 2008 and last Vitamin D was 74 on 10/3/201.  Medication Sig  . acidophilus (RISAQUAD) CAPS Take 1 capsule by mouth daily.  Marland Kitchen ALPRAZolam  1 MG tablet TAKE 1/2 TO 1 TABLET 3 TIMES A DAY AS NEEDED  . aspirin 81 MG tablet Take 81 mg by mouth daily.  . Atenolol 100 MG tablet TAKE 1 TABLET EVERY DAY  . b complex vitamins capsule Take 1 capsule by mouth daily.  . TUMS 500 MG chewable  Chew 1,000 mg by mouth at bedtime.   Marland Kitchen VITAMIN D 5000 UNITS  Take 5,000 Units by mouth daily.  . Cinnamon 500 MG TABS Cinnamon 1000 mg, 1 tab in morning and 1 tab evening  . fenofibrate  134 MG capsule TAKE ONE CAPSULE BY MOUTH EVERY  DAY  . Magnesium 250 MG TABS Take 250 mg by mouth 2 (two) times daily.  . Multiple Vitamins-Minerals  Take 1 tablet by mouth 2 (two) times daily.  . Omega-3 FISH OIL 1000  Take 1,000 mg by mouth daily.  . Tumeric with black pepper Take 1 tablet by mouth 2 (two) times daily.   Marland Kitchen PROCTOSOL HC 2.5 % rect crm APPLY 1 APPLICATORFUL RECTALLY 4 TIMES DAILY AS DIRECTED  . verapamil 80 MG tablet TAKE 1 TABLET BY MOUTH TWICE A DAY FOR BLOOD PRESSURE   Allergies  Allergen Reactions  . Codeine Itching  . Lipitor [Atorvastatin] Other (See Comments)    Severe fatigue. ? memory loss.  . Ace Inhibitors Rash    cough   Past Medical History  Diagnosis Date  . Hypertension   . H/O hiatal hernia     resolved with band surgery  . 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 At risk for difficult insertion of breathing tube   . Insomnia     tx with zanax  . Anxiety   . GERD (gastroesophageal reflux disease)     hx - resolved with lap band surgery  . Hyperlipidemia   . Breast cancer of upper-outer quadrant of right female breast ([place]) 12/03/2014  . Breast cancer ([place])   . Allergy     SEASONAL  . Type II or unspecified type  diabetes mellitus without mention of complication, not stated as uncontrolled 12/14/2012    no meds, diet controlled  . 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 ZOSTAVAX  10/05/2011  . OPHTHALMOLOGY EXAM  02/22/2014  . FOOT EXAM  12/19/2014  . URINE MICROALBUMIN  12/19/2014  . HEMOGLOBIN A1C  04/15/2015  . INFLUENZA VACCINE  08/13/2015  . PAP SMEAR  12/15/2015  . MAMMOGRAM  11/27/2016  . TETANUS/TDAP  01/04/2024  . COLONOSCOPY  12/16/2024  . PNEUMOCOCCAL POLYSACCHARIDE VACCINE  Addressed  . Hepatitis C Screening  Completed  . HIV Screening  Completed   Immunization History  Administered Date(s) Administered  . DT 12/18/2013  . DTaP 01/12/2002  . Influenza Split 12/14/2012, 11/19/2013, 10/27/2014  . PPD Test  12/14/2012, 12/18/2013  . Pneumococcal Conjugate-13 10/09/2013  . Pneumococcal Polysaccharide-23 01/13/1992   Past Surgical History  Procedure Laterality Date  . Cholecystectomy    . Diagnostic laparoscopy    . Hernia repair  2008  . Tubal ligation    . Back surgery      cervical  . Laparoscopic appendectomy N/A 08/06/2012    Procedure: APPENDECTOMY LAPAROSCOPIC;  Surgeon: Earnstine Regal, MD;  Location: [facility];  Service: General;  Laterality: N/A;  . Appendectomy  08/06/12    lap appy  . Laparoscopic gastric banding with hiatal hernia repair N/A Nov 2008  . Colonoscopy  09/29/2004    normal  . Cesarean section      x 1  . Breast lumpectomy with needle localization and axillary sentinel lymph node bx Right 12/28/2014    Procedure: RIGHT BREAST LUMPECTOMY WITH TWO (2) NEEDLE LOCALIZATION AND AXILLARY SENTINEL LYMPH NODE BX;  Surgeon: Alphonsa Overall, MD;  Location: [place];  Service: General;  Laterality: Right;   Family History  Problem Relation Age of Onset  . Cancer Cousin     Breast  . Cirrhosis Mother   . Alcohol abuse Mother   . Liver disease Mother   . Diabetes Father   . Hypertension Father   . Stroke Father   . Heart disease Father   . AAA (abdominal aortic aneurysm) Sister   . Colon cancer Neg Hx   . Rectal cancer Neg Hx   . Stomach cancer Neg Hx    Social History  Substance Use Topics  . Smoking status: Never Smoker   . Smokeless tobacco: Never Used  . Alcohol Use: No    ROS Constitutional: Denies fever, chills, weight loss/gain, headaches, insomnia,  night sweats, and change in appetite. Does c/o fatigue. Eyes: Denies redness, blurred vision, diplopia, discharge, itchy, watery eyes.  ENT: Denies discharge, congestion, post nasal drip, epistaxis, sore throat, earache, hearing loss, dental pain, Tinnitus, Vertigo, Sinus pain, snoring.  Cardio: Denies chest pain, palpitations, irregular heartbeat, syncope, dyspnea, diaphoresis, orthopnea, PND,  claudication, edema Respiratory: denies cough, dyspnea, DOE, pleurisy, hoarseness, laryngitis, wheezing.  Gastrointestinal: Denies dysphagia, heartburn, reflux, water brash, pain, cramps, nausea, vomiting, bloating, diarrhea, constipation, hematemesis, melena, hematochezia, jaundice, hemorrhoids Genitourinary: Denies dysuria, frequency, urgency, nocturia, hesitancy, discharge, hematuria, flank pain Breast: Breast lumps, nipple discharge, bleeding.  Musculoskeletal: Denies arthralgia, myalgia, stiffness, Jt. Swelling, pain, limp, and strain/sprain. Denies falls. Skin: Denies puritis, rash, hives, warts, acne, eczema, changing in skin lesion Neuro: No weakness, tremor, incoordination, spasms, paresthesia, pain Psychiatric: Denies confusion, memory loss, sensory loss. Denies Depression. Endocrine: Denies change in weight, skin, hair change, nocturia, and paresthesia, diabetic polys, visual blurring, hyper /  hypo glycemic episodes.  Heme/Lymph: No excessive bleeding, bruising, enlarged lymph nodes.  Physical Exam  BP 116/80 mmHg  Pulse 64  Temp(Src) 97 F (36.1 C)  Resp 16  Ht 5' 3.5" (1.613 m)  Wt 185 lb 9.6 oz (84.188 kg)  BMI 32.36 kg/m2  General Appearance: Well nourished and in no apparent distress. Eyes: PERRLA, EOMs, conjunctiva no swelling or erythema, normal fundi and vessels. Sinuses: No frontal/maxillary tenderness ENT/Mouth: EACs patent / TMs  nl. Nares clear without erythema, swelling, mucoid exudates. Oral hygiene is good. No erythema, swelling, or exudate. Tongue normal, non-obstructing. Tonsils not swollen or erythematous. Hearing normal.  Neck: Supple, thyroid normal. No bruits, nodes or JVD. Respiratory: Respiratory effort normal.  BS equal and clear bilateral without rales, rhonci, wheezing or stridor. Cardio: Heart sounds are normal with regular rate and rhythm and no murmurs, rubs or gallops. Peripheral pulses are normal and equal bilaterally without edema. No aortic or  femoral bruits. Chest: symmetric with normal excursions and percussion. Breasts: Symmetric, without lumps, nipple discharge, retractions, or fibrocystic changes.  Abdomen: Rotund, soft, with nl. bowel sounds. Nontender, no guarding, rebound, hernias, masses, or organomegaly.  Lymphatics: Non tender without lymphadenopathy.  Genitourinary:  Musculoskeletal: Full ROM all peripheral extremities, joint stability, 5/5 strength, and normal gait. Skin: Warm and dry without rashes, lesions, cyanosis, clubbing or  ecchymosis.  Neuro: Cranial nerves intact, reflexes equal bilaterally. Normal muscle tone, no cerebellar symptoms. Sensation intact.  Pysch: Alert and oriented X 3, normal affect, Insight and Judgment appropriate.   Assessment and Plan  1. Encounter for general adult medical examination with abnormal findings   2. Essential hypertension  - Microalbumin / creatinine urine ratio - EKG 12-Lead - [place], RETROPERITNL ABD,  LTD - TSH  3. Mixed hyperlipidemia  - Lipid panel - TSH  4. Type 2 diabetes mellitus with stage 2 chronic kidney disease, without long-term current use of insulin (HCC)  - Hemoglobin A1c - Insulin, random  5. Vitamin D deficiency  - VITAMIN D 25 Hydroxy  6. Morbid obesity, unspecified obesity type ([place])   7. Screening for rectal cancer  - POC Hemoccult Bld/Stl   8. Other fatigue  - Vitamin B12 - Iron and TIBC - CBC with Differential/Platelet - TSH  9. Medication management  - Urinalysis, Routine w reflex microscopic  - CBC with Differential/Platelet - BASIC METABOLIC PANEL WITH GFR - Hepatic function panel - Magnesium  10. Screening examination for pulmonary tuberculosis  - PPD   Continue prudent diet as discussed, weight control, BP monitoring, regular exercise, and medications. Discussed med's effects and SE's. Screening labs and tests as requested with regular follow-up as recommended. Over 40 minutes of exam, counseling, chart review and  high complex critical decision making was performed.

## 2015-01-24 NOTE — Patient Instructions (Signed)
Recommend Adult Low Dose Aspirin or   coated  Aspirin 81 mg daily   To reduce risk of Colon Cancer 20 %,   Skin Cancer 26 % ,   Melanoma 46%   and   Pancreatic cancer 60%   ++++++++++++++++++++++++++++++++++++++++++++++++++++++  Vitamin D goal   is between 70-100.   Please make sure that you are taking your Vitamin D as directed.   It is very important as a natural anti-inflammatory   helping hair, skin, and nails, as well as reducing stroke and heart attack risk.   It helps your bones and helps with mood.  It also decreases numerous cancer risks so please take it as directed.   Low Vit D is associated with a 200-300% higher risk for CANCER   and 200-300% higher risk for HEART   ATTACK  &  STROKE.   ......................................  It is also associated with higher death rate at younger ages,   autoimmune diseases like Rheumatoid arthritis, Lupus, Multiple Sclerosis.     Also many other serious conditions, like depression, Alzheimer's  Dementia, infertility, muscle aches, fatigue, fibromyalgia - just to name a few.  ++++++++++++++++++++++++++++++++++++++++++++++++  Recommend the book "The END of DIETING" by Dr Joel Fuhrman   & the book "The END of DIABETES " by Dr Joel Fuhrman  At [employer] - get book & Audio CD's     Being diabetic has a  300% increased risk for heart attack, stroke, cancer, and alzheimer- type vascular dementia. It is very important that you work harder with diet by avoiding all foods that are white. Avoid white rice (brown & wild rice is OK), white potatoes (sweetpotatoes in moderation is OK), White bread or wheat bread or anything made out of white flour like bagels, donuts, rolls, buns, biscuits, cakes, pastries, cookies, pizza crust, and pasta (made from white flour & egg whites) - vegetarian pasta or spinach or wheat pasta is OK. Multigrain breads like Arnold's or [employer], or multigrain sandwich thins or flatbreads.  Diet,  exercise and weight loss can reverse and cure diabetes in the early stages.  Diet, exercise and weight loss is very important in the control and prevention of complications of diabetes which affects every system in your body, ie. Brain - dementia/stroke, eyes - glaucoma/blindness, heart - heart attack/heart failure, kidneys - dialysis, stomach - gastric paralysis, intestines - malabsorption, nerves - severe painful neuritis, circulation - gangrene & loss of a leg(s), and finally cancer and Alzheimers.    I recommend avoid fried & greasy foods,  sweets/candy, white rice (brown or wild rice or Quinoa is OK), white potatoes (sweet potatoes are OK) - anything made from white flour - bagels, doughnuts, rolls, buns, biscuits,white and wheat breads, pizza crust and traditional pasta made of white flour & egg white(vegetarian pasta or spinach or wheat pasta is OK).  Multi-grain bread is OK - like multi-grain flat bread or sandwich thins. Avoid alcohol in excess. Exercise is also important.    Eat all the vegetables you want - avoid meat, especially red meat and dairy - especially cheese.  Cheese is the most concentrated form of trans-fats which is the worst thing to clog up our arteries. Veggie cheese is OK which can be found in the fresh produce section at [employer] or [employer] or [employer]  ++++++++++++++++++++++++++++++++++++++++++++++++++ DASH Eating Plan  DASH stands for "Dietary Approaches to Stop Hypertension."   The DASH eating plan is a healthy eating plan that has been shown to reduce high   blood pressure (hypertension). Additional health benefits may include reducing the risk of type 2 diabetes mellitus, heart disease, and stroke. The DASH eating plan may also help with weight loss.  WHAT DO I NEED TO KNOW ABOUT THE DASH EATING PLAN? For the DASH eating plan, you will follow these general guidelines:  Choose foods with a percent daily value for sodium of less than 5% (as listed on the food  label).  Use salt-free seasonings or herbs instead of table salt or sea salt.  Check with your health care provider or pharmacist before using salt substitutes.  Eat lower-sodium products, often labeled as "lower sodium" or "no salt added."  Eat fresh foods.  Eat more vegetables, fruits, and low-fat dairy products.    Choose whole grains. Look for the word "whole" as the first word in the ingredient list.  Choose fish   Limit sweets, desserts, sugars, and sugary drinks.  Choose heart-healthy fats.  Eat veggie cheese   Eat more home-cooked food and less restaurant, buffet, and fast food.  Limit fried foods.  Cook foods using methods other than frying.  Limit canned vegetables. If you do use them, rinse them well to decrease the sodium.  When eating at a restaurant, ask that your food be prepared with less salt, or no salt if possible.                      WHAT FOODS CAN I EAT?  Seek help from a dietitian for individual calorie needs.  Grains Whole grain or whole wheat bread. Brown rice. Whole grain or whole wheat pasta. Quinoa, bulgur, and whole grain cereals. Low-sodium cereals. Corn or whole wheat flour tortillas. Whole grain cornbread. Whole grain crackers. Low-sodium crackers.  Vegetables Fresh or frozen vegetables (raw, steamed, roasted, or grilled). Low-sodium or reduced-sodium tomato and vegetable juices. Low-sodium or reduced-sodium tomato sauce and paste. Low-sodium or reduced-sodium canned vegetables.   Fruits All fresh, canned (in natural juice), or frozen fruits.  Protein Products  All fish and seafood.  Dried beans, peas, or lentils. Unsalted nuts and seeds. Unsalted canned beans.  Dairy Low-fat dairy products, such as skim or 1% milk, 2% or reduced-fat cheeses, low-fat ricotta or cottage cheese, or plain low-fat yogurt. Low-sodium or reduced-sodium cheeses.  Fats and Oils Tub margarines without trans fats. Light or reduced-fat mayonnaise and salad  dressings (reduced sodium). Avocado. Safflower, olive, or canola oils. Natural peanut or almond butter.  Other Unsalted popcorn and pretzels. The items listed above may not be a complete list of recommended foods or beverages. Contact your dietitian for more options.  +++++++++++++++++++++++++++++++++++++++++++  WHAT FOODS ARE NOT RECOMMENDED?  Grains/ White flour or wheat flour  White bread. White pasta. White rice. Refined cornbread. Bagels and croissants. Crackers that contain trans fat.  Vegetables  Creamed or fried vegetables. Vegetables in a . Regular canned vegetables. Regular canned tomato sauce and paste. Regular tomato and vegetable juices.  Fruits Dried fruits. Canned fruit in light or heavy syrup. Fruit juice.  Meat and Other Protein Products Meat in general. Fatty cuts of meat. Ribs, chicken wings, bacon, sausage, bologna, salami, chitterlings, fatback, hot dogs, bratwurst, and packaged luncheon meats.  Dairy Whole or 2% milk, cream, half-and-half, and cream cheese. Whole-fat or sweetened yogurt. Full-fat cheeses or blue cheese. Nondairy creamers and whipped toppings. Processed cheese, cheese spreads, or cheese curds.  Condiments Onion and garlic salt, seasoned salt, table salt, and sea salt. Canned and packaged gravies. Worcestershire sauce. Tartar sauce.  Barbecue sauce. Teriyaki sauce. Soy sauce, including reduced sodium. Steak sauce. Fish sauce. Oyster sauce. Cocktail sauce. Horseradish. Ketchup and mustard. Meat flavorings and tenderizers. Bouillon cubes. Hot sauce. Tabasco sauce. Marinades. Taco seasonings. Relishes.  Fats and Oils Butter, stick margarine, lard, shortening, ghee, and bacon fat. Coconut, palm kernel, or palm oils. Regular salad dressings.  Pickles and olives. Salted popcorn and pretzels. The items listed above may not be a complete list of foods and beverages to avoid.   Preventive Care for Adults  A healthy lifestyle and preventive care can  promote health and wellness. Preventive health guidelines for women include the following key practices.  A routine yearly physical is a good way to check with your health care provider about your health and preventive screening. It is a chance to share any concerns and updates on your health and to receive a thorough exam.  Visit your dentist for a routine exam and preventive care every 6 months. Brush your teeth twice a day and floss once a day. Good oral hygiene prevents tooth decay and gum disease.  The frequency of eye exams is based on your age, health, family medical history, use of contact lenses, and other factors. Follow your health care provider's recommendations for frequency of eye exams.  Eat a healthy diet. Foods like vegetables, fruits, whole grains, low-fat dairy products, and lean protein foods contain the nutrients you need without too many calories. Decrease your intake of foods high in solid fats, added sugars, and salt. Eat the right amount of calories for you.Get information about a proper diet from your health care provider, if necessary.  Regular physical exercise is one of the most important things you can do for your health. Most adults should get at least 150 minutes of moderate-intensity exercise (any activity that increases your heart rate and causes you to sweat) each week. In addition, most adults need muscle-strengthening exercises on 2 or more days a week.  Maintain a healthy weight. The body mass index (BMI) is a screening tool to identify possible weight problems. It provides an estimate of body fat based on height and weight. Your health care provider can find your BMI and can help you achieve or maintain a healthy weight.For adults 20 years and older:  A BMI below 18.5 is considered underweight.  A BMI of 18.5 to 24.9 is normal.  A BMI of 25 to 29.9 is considered overweight.  A BMI of 30 and above is considered obese.  Maintain normal blood lipids and  cholesterol levels by exercising and minimizing your intake of saturated fat. Eat a balanced diet with plenty of fruit and vegetables. Blood tests for lipids and cholesterol should begin at age 31 and be repeated every 5 years. If your lipid or cholesterol levels are high, you are over 50, or you are at high risk for heart disease, you may need your cholesterol levels checked more frequently.Ongoing high lipid and cholesterol levels should be treated with medicines if diet and exercise are not working.  If you smoke, find out from your health care provider how to quit. If you do not use tobacco, do not start.  Lung cancer screening is recommended for adults aged 83-80 years who are at high risk for developing lung cancer because of a history of smoking. A yearly low-dose CT scan of the lungs is recommended for people who have at least a 30-pack-year history of smoking and are a current smoker or have quit within the past 15 years.  A pack year of smoking is smoking an average of 1 pack of cigarettes a day for 1 year (for example: 1 pack a day for 30 years or 2 packs a day for 15 years). Yearly screening should continue until the smoker has stopped smoking for at least 15 years. Yearly screening should be stopped for people who develop a health problem that would prevent them from having lung cancer treatment.  High blood pressure causes heart disease and increases the risk of stroke. Your blood pressure should be checked at least every 1 to 2 years. Ongoing high blood pressure should be treated with medicines if weight loss and exercise do not work.  If you are 61-30 years old, ask your health care provider if you should take aspirin to prevent strokes.  Diabetes screening involves taking a blood sample to check your fasting blood sugar level. This should be done once every 3 years, after age 9, if you are within normal weight and without risk factors for diabetes. Testing should be considered at a  younger age or be carried out more frequently if you are overweight and have at least 1 risk factor for diabetes.  Breast cancer screening is essential preventive care for women. You should practice "breast self-awareness." This means understanding the normal appearance and feel of your breasts and may include breast self-examination. Any changes detected, no matter how small, should be reported to a health care provider. Women in their 74s and 30s should have a clinical breast exam (CBE) by a health care provider as part of a regular health exam every 1 to 3 years. After age 75, women should have a CBE every year. Starting at age 19, women should consider having a mammogram (breast X-ray test) every year. Women who have a family history of breast cancer should talk to their health care provider about genetic screening. Women at a high risk of breast cancer should talk to their health care providers about having an MRI and a mammogram every year.  Breast cancer gene (BRCA)-related cancer risk assessment is recommended for women who have family members with BRCA-related cancers. BRCA-related cancers include breast, ovarian, tubal, and peritoneal cancers. Having family members with these cancers may be associated with an increased risk for harmful changes (mutations) in the breast cancer genes BRCA1 and BRCA2. Results of the assessment will determine the need for genetic counseling and BRCA1 and BRCA2 testing.  Routine pelvic exams to screen for cancer are no longer recommended for nonpregnant women who are considered low risk for cancer of the pelvic organs (ovaries, uterus, and vagina) and who do not have symptoms. Ask your health care provider if a screening pelvic exam is right for you.  If you have had past treatment for cervical cancer or a condition that could lead to cancer, you need Pap tests and screening for cancer for at least 20 years after your treatment. If Pap tests have been discontinued, your  risk factors (such as having a new sexual partner) need to be reassessed to determine if screening should be resumed. Some women have medical problems that increase the chance of getting cervical cancer. In these cases, your health care provider may recommend more frequent screening and Pap tests.  Colorectal cancer can be detected and often prevented. Most routine colorectal cancer screening begins at the age of 23 years and continues through age 42 years. However, your health care provider may recommend screening at an earlier age if you have risk factors for colon  cancer. On a yearly basis, your health care provider may provide home test kits to check for hidden blood in the stool. Use of a small camera at the end of a tube, to directly examine the colon (sigmoidoscopy or colonoscopy), can detect the earliest forms of colorectal cancer. Talk to your health care provider about this at age 50, when routine screening begins. Direct exam of the colon should be repeated every 5-10 years through age 75 years, unless early forms of pre-cancerous polyps or small growths are found.  Hepatitis C blood testing is recommended for all people born from 1945 through 1965 and any individual with known risks for hepatitis C.  Pra  Osteoporosis is a disease in which the bones lose minerals and strength with aging. This can result in serious bone fractures or breaks. The risk of osteoporosis can be identified using a bone density scan. Women ages 65 years and over and women at risk for fractures or osteoporosis should discuss screening with their health care providers. Ask your health care provider whether you should take a calcium supplement or vitamin D to reduce the rate of osteoporosis.  Menopause can be associated with physical symptoms and risks. Hormone replacement therapy is available to decrease symptoms and risks. You should talk to your health care provider about whether hormone replacement therapy is right  for you.  Use sunscreen. Apply sunscreen liberally and repeatedly throughout the day. You should seek shade when your shadow is shorter than you. Protect yourself by wearing long sleeves, pants, a wide-brimmed hat, and sunglasses year round, whenever you are outdoors.  Once a month, do a whole body skin exam, using a mirror to look at the skin on your back. Tell your health care provider of new moles, moles that have irregular borders, moles that are larger than a pencil eraser, or moles that have changed in shape or color.  Stay current with required vaccines (immunizations).  Influenza vaccine. All adults should be immunized every year.  Tetanus, diphtheria, and acellular pertussis (Td, Tdap) vaccine. Pregnant women should receive 1 dose of Tdap vaccine during each pregnancy. The dose should be obtained regardless of the length of time since the last dose. Immunization is preferred during the 27th-36th week of gestation. An adult who has not previously received Tdap or who does not know her vaccine status should receive 1 dose of Tdap. This initial dose should be followed by tetanus and diphtheria toxoids (Td) booster doses every 10 years. Adults with an unknown or incomplete history of completing a 3-dose immunization series with Td-containing vaccines should begin or complete a primary immunization series including a Tdap dose. Adults should receive a Td booster every 10 years.  Varicella vaccine. An adult without evidence of immunity to varicella should receive 2 doses or a second dose if she has previously received 1 dose. Pregnant females who do not have evidence of immunity should receive the first dose after pregnancy. This first dose should be obtained before leaving the health care facility. The second dose should be obtained 4-8 weeks after the first dose.  Human papillomavirus (HPV) vaccine. Females aged 13-26 years who have not received the vaccine previously should obtain the 3-dose  series. The vaccine is not recommended for use in pregnant females. However, pregnancy testing is not needed before receiving a dose. If a female is found to be pregnant after receiving a dose, no treatment is needed. In that case, the remaining doses should be delayed until after the pregnancy. Immunization   is recommended for any person with an immunocompromised condition through the age of 26 years if she did not get any or all doses earlier. During the 3-dose series, the second dose should be obtained 4-8 weeks after the first dose. The third dose should be obtained 24 weeks after the first dose and 16 weeks after the second dose.  Zoster vaccine. One dose is recommended for adults aged 60 years or older unless certain conditions are present.  Measles, mumps, and rubella (MMR) vaccine. Adults born before 1957 generally are considered immune to measles and mumps. Adults born in 1957 or later should have 1 or more doses of MMR vaccine unless there is a contraindication to the vaccine or there is laboratory evidence of immunity to each of the three diseases. A routine second dose of MMR vaccine should be obtained at least 28 days after the first dose for students attending postsecondary schools, health care workers, or international travelers. People who received inactivated measles vaccine or an unknown type of measles vaccine during 1963-1967 should receive 2 doses of MMR vaccine. People who received inactivated mumps vaccine or an unknown type of mumps vaccine before 1979 and are at high risk for mumps infection should consider immunization with 2 doses of MMR vaccine. For females of childbearing age, rubella immunity should be determined. If there is no evidence of immunity, females who are not pregnant should be vaccinated. If there is no evidence of immunity, females who are pregnant should delay immunization until after pregnancy. Unvaccinated health care workers born before 1957 who lack laboratory  evidence of measles, mumps, or rubella immunity or laboratory confirmation of disease should consider measles and mumps immunization with 2 doses of MMR vaccine or rubella immunization with 1 dose of MMR vaccine.  Pneumococcal 13-valent conjugate (PCV13) vaccine. When indicated, a person who is uncertain of her immunization history and has no record of immunization should receive the PCV13 vaccine. An adult aged 19 years or older who has certain medical conditions and has not been previously immunized should receive 1 dose of PCV13 vaccine. This PCV13 should be followed with a dose of pneumococcal polysaccharide (PPSV23) vaccine. The PPSV23 vaccine dose should be obtained at least 8 weeks after the dose of PCV13 vaccine. An adult aged 19 years or older who has certain medical conditions and previously received 1 or more doses of PPSV23 vaccine should receive 1 dose of PCV13. The PCV13 vaccine dose should be obtained 1 or more years after the last PPSV23 vaccine dose.    Pneumococcal polysaccharide (PPSV23) vaccine. When PCV13 is also indicated, PCV13 should be obtained first. All adults aged 65 years and older should be immunized. An adult younger than age 65 years who has certain medical conditions should be immunized. Any person who resides in a nursing home or long-term care facility should be immunized. An adult smoker should be immunized. People with an immunocompromised condition and certain other conditions should receive both PCV13 and PPSV23 vaccines. People with human immunodeficiency virus (HIV) infection should be immunized as soon as possible after diagnosis. Immunization during chemotherapy or radiation therapy should be avoided. Routine use of PPSV23 vaccine is not recommended for American Indians, [place] Natives, or people younger than 65 years unless there are medical conditions that require PPSV23 vaccine. When indicated, people who have unknown immunization and have no record of immunization  should receive PPSV23 vaccine. One-time revaccination 5 years after the first dose of PPSV23 is recommended for people aged 19-64 years who have   chronic kidney failure, nephrotic syndrome, asplenia, or immunocompromised conditions. People who received 1-2 doses of PPSV23 before age 28 years should receive another dose of PPSV23 vaccine at age 64 years or later if at least 5 years have passed since the previous dose. Doses of PPSV23 are not needed for people immunized with PPSV23 at or after age 65 years.  Preventive Services / Frequency   Ages 52 to 76 years  Blood pressure check.  Lipid and cholesterol check.  Lung cancer screening. / Every year if you are aged 62-80 years and have a 30-pack-year history of smoking and currently smoke or have quit within the past 15 years. Yearly screening is stopped once you have quit smoking for at least 15 years or develop a health problem that would prevent you from having lung cancer treatment.  Clinical breast exam.** / Every year after age 38 years.  BRCA-related cancer risk assessment.** / For women who have family members with a BRCA-related cancer (breast, ovarian, tubal, or peritoneal cancers).  Mammogram.** / Every year beginning at age 69 years and continuing for as long as you are in good health. Consult with your health care provider.  Pap test.** / Every 3 years starting at age 29 years through age 47 or 54 years with a history of 3 consecutive normal Pap tests.  HPV screening.** / Every 3 years from ages 1 years through ages 108 to 42 years with a history of 3 consecutive normal Pap tests.  Fecal occult blood test (FOBT) of stool. / Every year beginning at age 36 years and continuing until age 71 years. You may not need to do this test if you get a colonoscopy every 10 years.  Flexible sigmoidoscopy or colonoscopy.** / Every 5 years for a flexible sigmoidoscopy or every 10 years for a colonoscopy beginning at age 65 years and continuing  until age 21 years.  Hepatitis C blood test.** / For all people born from [birth year] through 1965 and any individual with known risks for hepatitis C.  Skin self-exam. / Monthly.  Influenza vaccine. / Every year.  Tetanus, diphtheria, and acellular pertussis (Tdap/Td) vaccine.** / Consult your health care provider. Pregnant women should receive 1 dose of Tdap vaccine during each pregnancy. 1 dose of Td every 10 years.  Varicella vaccine.** / Consult your health care provider. Pregnant females who do not have evidence of immunity should receive the first dose after pregnancy.  Zoster vaccine.** / 1 dose for adults aged 69 years or older.  Pneumococcal 13-valent conjugate (PCV13) vaccine.** / Consult your health care provider.  Pneumococcal polysaccharide (PPSV23) vaccine.** / 1 to 2 doses if you smoke cigarettes or if you have certain conditions.  Meningococcal vaccine.** / Consult your health care provider.  Hepatitis A vaccine.** / Consult your health care provider.  Hepatitis B vaccine.** / Consult your health care provider. Screening for abdominal aortic aneurysm (AAA)  by ultrasound is recommended for people over 50 who have history of high blood pressure or who are current or former smokers.

## 2015-01-25 LAB — MICROALBUMIN / CREATININE URINE RATIO
CREATININE, URINE: 151 mg/dL (ref 20–320)
Microalb Creat Ratio: 3 mcg/mg creat (ref <30)
Microalb, Ur: 0.5 mg/dL

## 2015-01-25 LAB — BASIC METABOLIC PANEL WITH GFR
BUN: 17 mg/dL (ref 7–25)
CALCIUM: 9.4 mg/dL (ref 8.6–10.4)
CO2: 28 mmol/L (ref 20–31)
Chloride: 102 mmol/L (ref 98–110)
Creat: 1.02 mg/dL — ABNORMAL HIGH (ref 0.50–0.99)
GFR, EST AFRICAN AMERICAN: 68 mL/min (ref >=60)
GFR, EST NON AFRICAN AMERICAN: 59 mL/min — AB (ref >=60)
Glucose, Bld: 90 mg/dL (ref 65–99)
Potassium: 4.6 mmol/L (ref 3.5–5.3)
SODIUM: 139 mmol/L (ref 135–146)

## 2015-01-25 LAB — URINALYSIS, ROUTINE W REFLEX MICROSCOPIC
BILIRUBIN URINE: NEGATIVE
Glucose, UA: NEGATIVE
Hgb urine dipstick: NEGATIVE
KETONES UR: NEGATIVE
Leukocytes, UA: NEGATIVE
NITRITE: NEGATIVE
PH: 7.5 (ref 5.0–8.0)
Protein, ur: NEGATIVE
SPECIFIC GRAVITY, URINE: 1.019 (ref 1.001–1.035)

## 2015-01-25 LAB — VITAMIN D 25 HYDROXY (VIT D DEFICIENCY, FRACTURES): VIT D 25 HYDROXY: 58 ng/mL (ref 30–100)

## 2015-01-25 LAB — HEPATIC FUNCTION PANEL
ALT: 32 U/L — ABNORMAL HIGH (ref 6–29)
AST: 28 U/L (ref 10–35)
Albumin: 4.1 g/dL (ref 3.6–5.1)
Alkaline Phosphatase: 38 U/L (ref 33–130)
BILIRUBIN DIRECT: 0.1 mg/dL (ref <=0.2)
BILIRUBIN TOTAL: 0.3 mg/dL (ref 0.2–1.2)
Indirect Bilirubin: 0.2 mg/dL (ref 0.2–1.2)
Total Protein: 6.5 g/dL (ref 6.1–8.1)

## 2015-01-25 LAB — HEMOGLOBIN A1C
Hgb A1c MFr Bld: 5.9 % — ABNORMAL HIGH (ref <5.7)
Mean Plasma Glucose: 123 mg/dL — ABNORMAL HIGH (ref <117)

## 2015-01-25 LAB — LIPID PANEL
CHOL/HDL RATIO: 2.6 ratio (ref <=5.0)
CHOLESTEROL: 202 mg/dL — AB (ref 125–200)
HDL: 77 mg/dL (ref >=46)
LDL Cholesterol: 105 mg/dL (ref <130)
TRIGLYCERIDES: 100 mg/dL (ref <150)
VLDL: 20 mg/dL (ref <30)

## 2015-01-25 LAB — IRON AND TIBC
%SAT: 13 % (ref 11–50)
IRON: 56 ug/dL (ref 45–160)
TIBC: 418 ug/dL (ref 250–450)
UIBC: 362 ug/dL (ref 125–400)

## 2015-01-25 LAB — VITAMIN B12: Vitamin B-12: 908 pg/mL (ref 211–911)

## 2015-01-25 LAB — TSH: TSH: 2.844 u[IU]/mL (ref 0.350–4.500)

## 2015-01-25 LAB — INSULIN, RANDOM: Insulin: 8.4 u[IU]/mL (ref 2.0–19.6)

## 2015-01-25 LAB — MAGNESIUM: MAGNESIUM: 1.9 mg/dL (ref 1.5–2.5)

## 2015-02-04 ENCOUNTER — Encounter: Payer: Self-pay | Admitting: Radiation Oncology

## 2015-02-04 NOTE — Progress Notes (Signed)
Location of Breast Cancer:Diagnosis 12-28-14 1. 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 - DUCTAL CARCINOMA IN SITU, LOW GRADE TO INTERMEDIATE GRADE. - LOBULAR NEOPLASIA (ATYPICAL LOBULAR HYPERPLASIA). - THE SURGICAL RESECTION MARGINS ARE NEGATIVE FOR CARCINOMA. - SEE ONCOLOGY TABLE BELOW. 2. Lymph node, biopsy, Right submammary area node - BENIGN FIBROADIPOSE TISSUE AND SKELETAL MUSCLE. - LYMPH NODAL TISSUE IS NOT IDENTIFIED. - THERE IS NO EVIDENCE OF MALIGNANCY. 3. Fatty tissue, Right axillary 1 of  Histology per Pathology Report:  Receptor Status: ER(100%), PR (85%), Her2-neu (negative) KI-67 (10%)   Taylor Oconnor  had routine bilateral digital screening mammography 11/16/2014 at the [facility] Center, showing a possible mass in the right breast.   Past/Anticipated interventions by surgeon, if any:12-28-14 Breast Lumpectomy,Right  Past/Anticipated interventions by medical oncology, if any: Will give anti-estrogen therapy after radiation Lymphedema issues, if any:     Pain issues, if any: Intermittent tenderness of her right breast occasional shooting pain  SAFETY ISSUES:  Prior radiation? NO  Pacemaker/ICD? NO  Possible current pregnancy? NO  Is the patient on methotrexate? NO  Current Complaints / other details:     GYNECOLOGIC HISTORY:   No LMP recorded. Patient is postmenopausal. Menarche age 37, first live birth age 48. The patient is G1, P1. She stopped having periods in 2002. She did not take hormone replacement. She used oral contraceptives for 11 years remotely, with no complications.   Taylor Spurling, RN 02/04/2015,12:33 PM

## 2015-02-06 ENCOUNTER — Ambulatory Visit
Admission: RE | Admit: 2015-02-06 | Discharge: 2015-02-06 | Disposition: A | Discharge status: Home / Self Care | Payer: 59 | Source: Ambulatory Visit | Attending: Radiation Oncology | Admitting: Radiation Oncology

## 2015-02-06 ENCOUNTER — Encounter: Payer: Self-pay | Admitting: Radiation Oncology

## 2015-02-06 VITALS — BP 130/72 | HR 77 | Temp 97.9°F | Ht 63.5 in | Wt 188.5 lb

## 2015-02-06 DIAGNOSIS — C50411 Malignant neoplasm of upper-outer quadrant of right female breast: Secondary | ICD-10-CM | POA: Diagnosis present

## 2015-02-06 DIAGNOSIS — Z510 Encounter for antineoplastic radiation therapy: Secondary | ICD-10-CM | POA: Diagnosis not present

## 2015-02-06 DIAGNOSIS — Z170 Estrogen receptor positive status [ER+]: Secondary | ICD-10-CM | POA: Diagnosis not present

## 2015-02-06 NOTE — Progress Notes (Signed)
  Radiation Oncology         [phone]) [phone] ________________________________  Initial outpatient Consultation - Date: 02/06/2015   Name: Taylor Oconnor MRN: [id number]   DOB: 08-08-1950  REFERRING PHYSICIAN: Alphonsa Overall, MD  DIAGNOSIS AND STAGE: Breast cancer of upper-outer quadrant of right female breast [facility])   Staging form: Breast, AJCC 7th Edition     Clinical stage from 12/12/2014: Stage IA (T1b, N0, M0) - Unsigned       Staging comments: Staged at breast conference on 11.30.16      Pathologic stage from 12/31/2014: Stage IA (T1c(m), N0, cM0) - Unsigned       Staging comments: Staged on final surgical specimen by Dr. Lyndon Code   HISTORY OF PRESENT ILLNESS:Taylor Oconnor is a 65 y.o. female  Who had a lumpectomy on 12/28/14. This showed two foci of invasive ductal carcinoma. The largest measuring 1.5 cm. Two sentinel lymph nodes were negative. Her tumor was ER+, PR+, HER-2(-). Her oncotype score was low.   She has a follow-up appointment with Dr. [name] on 02/27/15.  Ms. Deshazo is here alone today. She has been experiencing fatigue associated with stress at work. Reports intermittent tenderness of her right breast with occasional shooting pain.  PREVIOUS RADIATION THERAPY: No  Past medical, social and family history were reviewed in the electronic chart. Review of symptoms was reviewed in the electronic chart. Medications were reviewed in the electronic chart.   PHYSICAL EXAM:  Filed Vitals:   02/06/15 1355  BP: 130/72  Pulse: 77  Temp: 97.9 F (36.6 C)  .188 lb 8 oz (85.503 kg).   General: Alert and oriented.  Neck: Neck is supple, no palpable cervical or supraclavicular lymphadenopathy. Breast: Well healed breast   IMPRESSION: Stage IA (T1c(m), N0, cM0) Breast cancer of upper-outer quadrant of right female breast  PLAN: I spoke to the patient today regarding her diagnosis and options for treatment. We discussed the equivalence in terms of survival and local failure between  mastectomy and breast conservation. We discussed the role of radiation in decreasing local failures in patients who undergo lumpectomy. We discussed the process of simulation and the placement tattoos. We discussed 3-6 weeks of treatment as an outpatient. We discussed the possibility of asymptomatic lung damage. We discussed the low likelihood of secondary malignancies. We discussed the possible side effects including but not limited to skin redness, fatigue, permanent skin darkening, and breast swelling.    We will continue with simulation and treatment planning tomorrow, 1/26, at 8 am. She has a follow-up appointment with Dr. [name] on 02/27/15.  I spent 40 minutes  face to face with the patient and more than 50% of that time was spent in counseling and/or coordination of care.   ------------------------------------------------  Thea Silversmith, MD  This document serves as a record of services personally performed by Thea Silversmith, MD. It was created on her behalf by Arlyce Harman, a trained medical scribe. The creation of this record is based on the scribe's personal observations and the provider's statements to them. This document has been checked and approved by the attending provider.

## 2015-02-07 ENCOUNTER — Ambulatory Visit
Admission: RE | Admit: 2015-02-07 | Discharge: 2015-02-07 | Disposition: A | Discharge status: Home / Self Care | Payer: 59 | Source: Ambulatory Visit | Attending: Radiation Oncology | Admitting: Radiation Oncology

## 2015-02-07 DIAGNOSIS — Z510 Encounter for antineoplastic radiation therapy: Secondary | ICD-10-CM | POA: Diagnosis not present

## 2015-02-07 DIAGNOSIS — C50411 Malignant neoplasm of upper-outer quadrant of right female breast: Secondary | ICD-10-CM

## 2015-02-07 NOTE — Progress Notes (Signed)
Name: Taylor Oconnor   MRN: [id number]  Date:  02/07/2015  DOB: 1950/12/14  Status:outpatient    DIAGNOSIS: Breast cancer.  CONSENT VERIFIED: yes   SET UP: Patient is setup supine   IMMOBILIZATION:  The following immobilization was used:Custom Moldable Pillow, breast board.   NARRATIVE: [name] was brought to the [place].  Identity was confirmed.  All relevant records and images related to the planned course of therapy were reviewed.  Then, the patient was positioned in a stable reproducible clinical set-up for radiation therapy.  Wires were placed to delineate the clinical extent of breast tissue. A wire was placed on the scar as well.  CT images were obtained.  An isocenter was placed. Skin markings were placed.  The CT images were loaded into the planning software where the target and avoidance structures were contoured.  The radiation prescription was entered and confirmed. The patient was discharged in stable condition and tolerated simulation well.    TREATMENT PLANNING NOTE:  Treatment planning then occurred. I have requested : MLC's, isodose plan, basic dose calculation  I personally designed and supervised the construction of 3 medically necessary complex treatment devices for the protection of critical normal structures including the lungs and contralateral breast as well as the immobilization device which is necessary for set up certainty.   3D simulation occurred. I requested and analyzed a dose volume histogram of the heart, lungs and lumpectomy cavity.

## 2015-02-07 NOTE — Addendum Note (Signed)
Encounter addended by: Malena Edman, RN on: 02/07/2015  5:20 PM<BR>     Documentation filed: Charges VN

## 2015-02-11 ENCOUNTER — Other Ambulatory Visit: Payer: Self-pay | Admitting: *Deleted

## 2015-02-13 ENCOUNTER — Telehealth: Payer: Self-pay | Admitting: Oncology

## 2015-02-13 DIAGNOSIS — Z510 Encounter for antineoplastic radiation therapy: Secondary | ICD-10-CM | POA: Diagnosis not present

## 2015-02-13 NOTE — Telephone Encounter (Signed)
Per 1/30 pof moved February lab f/u to April. Left message for patient and mailed schedule.

## 2015-02-14 ENCOUNTER — Ambulatory Visit
Admission: RE | Admit: 2015-02-14 | Discharge: 2015-02-14 | Disposition: A | Discharge status: Home / Self Care | Payer: 59 | Source: Ambulatory Visit | Attending: Radiation Oncology | Admitting: Radiation Oncology

## 2015-02-14 DIAGNOSIS — Z510 Encounter for antineoplastic radiation therapy: Secondary | ICD-10-CM | POA: Diagnosis not present

## 2015-02-18 ENCOUNTER — Ambulatory Visit
Admission: RE | Admit: 2015-02-18 | Discharge: 2015-02-18 | Disposition: A | Discharge status: Home / Self Care | Payer: 59 | Source: Ambulatory Visit | Attending: Radiation Oncology | Admitting: Radiation Oncology

## 2015-02-18 DIAGNOSIS — Z510 Encounter for antineoplastic radiation therapy: Secondary | ICD-10-CM | POA: Diagnosis not present

## 2015-02-18 DIAGNOSIS — C50411 Malignant neoplasm of upper-outer quadrant of right female breast: Secondary | ICD-10-CM

## 2015-02-18 MED ORDER — RADIAPLEXRX EX GEL
Freq: Once | CUTANEOUS | Status: AC
  Administered 2015-02-18: 13:00:00 via TOPICAL

## 2015-02-18 MED ORDER — ALRA NON-METALLIC DEODORANT (RAD-ONC)
1.0000 "application " | Freq: Once | TOPICAL | Status: AC
  Administered 2015-02-18: 1 via TOPICAL

## 2015-02-19 ENCOUNTER — Encounter: Payer: Self-pay | Admitting: Radiation Oncology

## 2015-02-19 ENCOUNTER — Ambulatory Visit
Admission: RE | Admit: 2015-02-19 | Discharge: 2015-02-19 | Disposition: A | Discharge status: Home / Self Care | Payer: 59 | Source: Ambulatory Visit | Attending: Radiation Oncology | Admitting: Radiation Oncology

## 2015-02-19 VITALS — BP 110/67 | HR 81 | Temp 98.1°F | Ht 63.5 in | Wt 187.2 lb

## 2015-02-19 DIAGNOSIS — Z510 Encounter for antineoplastic radiation therapy: Secondary | ICD-10-CM | POA: Diagnosis not present

## 2015-02-19 DIAGNOSIS — C50411 Malignant neoplasm of upper-outer quadrant of right female breast: Secondary | ICD-10-CM

## 2015-02-19 NOTE — Progress Notes (Signed)
Taylor Oconnor has received 2 fractions to her right breast.  No voiced concerns at this time.

## 2015-02-19 NOTE — Progress Notes (Signed)
Weekly Management Note Current Dose: 3.6  Gy  Projected Dose: 61 Gy   Narrative:  The patient presents for routine under treatment assessment.  CBCT/MVCT images/Port film x-rays were reviewed.  The chart was checked. Doing well. No complaints.   Physical Findings: Weight: 187 lb 3.2 oz (84.913 kg). Unchanged  Impression:  The patient is tolerating radiation.  Plan:  Continue treatment as planned. Continue radiaplex. OK to go to dentist.

## 2015-02-20 ENCOUNTER — Ambulatory Visit
Admission: RE | Admit: 2015-02-20 | Discharge: 2015-02-20 | Disposition: A | Discharge status: Home / Self Care | Payer: 59 | Source: Ambulatory Visit | Attending: Radiation Oncology | Admitting: Radiation Oncology

## 2015-02-20 ENCOUNTER — Other Ambulatory Visit: Payer: 59

## 2015-02-20 DIAGNOSIS — Z510 Encounter for antineoplastic radiation therapy: Secondary | ICD-10-CM | POA: Diagnosis not present

## 2015-02-21 ENCOUNTER — Ambulatory Visit
Admission: RE | Admit: 2015-02-21 | Discharge: 2015-02-21 | Disposition: A | Discharge status: Home / Self Care | Payer: 59 | Source: Ambulatory Visit | Attending: Radiation Oncology | Admitting: Radiation Oncology

## 2015-02-21 DIAGNOSIS — Z510 Encounter for antineoplastic radiation therapy: Secondary | ICD-10-CM | POA: Diagnosis not present

## 2015-02-22 ENCOUNTER — Ambulatory Visit
Admission: RE | Admit: 2015-02-22 | Discharge: 2015-02-22 | Disposition: A | Discharge status: Home / Self Care | Payer: 59 | Source: Ambulatory Visit | Attending: Radiation Oncology | Admitting: Radiation Oncology

## 2015-02-22 DIAGNOSIS — Z510 Encounter for antineoplastic radiation therapy: Secondary | ICD-10-CM | POA: Diagnosis not present

## 2015-02-25 ENCOUNTER — Encounter: Payer: Self-pay | Admitting: *Deleted

## 2015-02-25 ENCOUNTER — Ambulatory Visit
Admission: RE | Admit: 2015-02-25 | Discharge: 2015-02-25 | Disposition: A | Discharge status: Home / Self Care | Payer: 59 | Source: Ambulatory Visit | Attending: Radiation Oncology | Admitting: Radiation Oncology

## 2015-02-25 DIAGNOSIS — Z510 Encounter for antineoplastic radiation therapy: Secondary | ICD-10-CM | POA: Diagnosis not present

## 2015-02-26 ENCOUNTER — Ambulatory Visit
Admission: RE | Admit: 2015-02-26 | Discharge: 2015-02-26 | Disposition: A | Discharge status: Home / Self Care | Payer: 59 | Source: Ambulatory Visit | Attending: Radiation Oncology | Admitting: Radiation Oncology

## 2015-02-26 ENCOUNTER — Encounter: Payer: Self-pay | Admitting: Radiation Oncology

## 2015-02-26 VITALS — BP 113/55 | HR 76 | Temp 97.8°F | Resp 20 | Wt 190.1 lb

## 2015-02-26 DIAGNOSIS — Z510 Encounter for antineoplastic radiation therapy: Secondary | ICD-10-CM | POA: Diagnosis not present

## 2015-02-26 DIAGNOSIS — C50411 Malignant neoplasm of upper-outer quadrant of right female breast: Secondary | ICD-10-CM

## 2015-02-26 NOTE — Progress Notes (Signed)
Weekly rad  tx right breast 7 txs completed, mild pink tinge on right breast, radiaplex bid, occasional "ping" feeling   Appetite good no pain 8:07 AM BP 113/55 mmHg  Pulse 76  Temp(Src) 97.8 F (36.6 C) (Oral)  Resp 20  Wt 190 lb 1.6 oz (86.229 kg)  Wt Readings from Last 3 Encounters:  02/26/15 190 lb 1.6 oz (86.229 kg)  02/19/15 187 lb 3.2 oz (84.913 kg)  02/06/15 188 lb 8 oz (85.503 kg)

## 2015-02-26 NOTE — Progress Notes (Signed)
Weekly Management Note Current Dose: 12.6  Gy  Projected Dose: 61 Gy   Narrative:  The patient presents for routine under treatment assessment.  CBCT/MVCT images/Port film x-rays were reviewed.  The chart was checked. Doing well. No complaints.   Physical Findings: Weight: 190 lb 1.6 oz (86.229 kg). Slightly pink breast.   Impression:  The patient is tolerating radiation.  Plan:  Continue treatment as planned. Continue radiaplex.

## 2015-02-27 ENCOUNTER — Ambulatory Visit
Admission: RE | Admit: 2015-02-27 | Discharge: 2015-02-27 | Disposition: A | Discharge status: Home / Self Care | Payer: 59 | Source: Ambulatory Visit | Attending: Radiation Oncology | Admitting: Radiation Oncology

## 2015-02-27 ENCOUNTER — Ambulatory Visit: Payer: 59 | Admitting: Oncology

## 2015-02-27 DIAGNOSIS — Z510 Encounter for antineoplastic radiation therapy: Secondary | ICD-10-CM | POA: Diagnosis not present

## 2015-02-28 ENCOUNTER — Ambulatory Visit
Admission: RE | Admit: 2015-02-28 | Discharge: 2015-02-28 | Disposition: A | Discharge status: Home / Self Care | Payer: 59 | Source: Ambulatory Visit | Attending: Radiation Oncology | Admitting: Radiation Oncology

## 2015-02-28 DIAGNOSIS — Z510 Encounter for antineoplastic radiation therapy: Secondary | ICD-10-CM | POA: Diagnosis not present

## 2015-03-01 ENCOUNTER — Ambulatory Visit: Payer: 59 | Attending: Radiation Oncology | Admitting: Radiation Oncology

## 2015-03-01 ENCOUNTER — Ambulatory Visit
Admission: RE | Admit: 2015-03-01 | Discharge: 2015-03-01 | Disposition: A | Discharge status: Home / Self Care | Payer: 59 | Source: Ambulatory Visit | Attending: Radiation Oncology | Admitting: Radiation Oncology

## 2015-03-01 DIAGNOSIS — Z510 Encounter for antineoplastic radiation therapy: Secondary | ICD-10-CM | POA: Diagnosis not present

## 2015-03-04 ENCOUNTER — Ambulatory Visit
Admission: RE | Admit: 2015-03-04 | Discharge: 2015-03-04 | Disposition: A | Discharge status: Home / Self Care | Payer: 59 | Source: Ambulatory Visit | Attending: Radiation Oncology | Admitting: Radiation Oncology

## 2015-03-04 DIAGNOSIS — Z510 Encounter for antineoplastic radiation therapy: Secondary | ICD-10-CM | POA: Diagnosis not present

## 2015-03-05 ENCOUNTER — Ambulatory Visit
Admission: RE | Admit: 2015-03-05 | Discharge: 2015-03-05 | Disposition: A | Discharge status: Home / Self Care | Payer: 59 | Source: Ambulatory Visit | Attending: Radiation Oncology | Admitting: Radiation Oncology

## 2015-03-05 ENCOUNTER — Inpatient Hospital Stay: Admission: RE | Admit: 2015-03-05 | Payer: 59 | Source: Ambulatory Visit | Admitting: Radiation Oncology

## 2015-03-05 ENCOUNTER — Encounter: Payer: Self-pay | Admitting: Radiation Oncology

## 2015-03-05 VITALS — BP 111/67 | HR 68 | Temp 97.7°F | Resp 20 | Wt 189.2 lb

## 2015-03-05 DIAGNOSIS — C50411 Malignant neoplasm of upper-outer quadrant of right female breast: Secondary | ICD-10-CM

## 2015-03-05 DIAGNOSIS — Z510 Encounter for antineoplastic radiation therapy: Secondary | ICD-10-CM | POA: Diagnosis not present

## 2015-03-05 NOTE — Progress Notes (Addendum)
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  Wt Readings from Last 3 Encounters:  02/26/15 190 lb 1.6 oz (86.229 kg)  02/19/15 187 lb 3.2 oz (84.913 kg)  02/06/15 188 lb 8 oz (85.503 kg)

## 2015-03-05 NOTE — Progress Notes (Signed)
Weekly Management Note Current Dose: 21.6  Gy  Projected Dose: 61 Gy   Narrative:  The patient presents for routine under treatment assessment.  CBCT/MVCT images/Port film x-rays were reviewed.  The chart was checked. Doing well. No complaints.   Physical Findings: Weight: 189 lb 3.2 oz (85.821 kg). Slightly pink breast. Some early dermatitis medially.   Impression:  The patient is tolerating radiation.  Plan:  Continue treatment as planned. Continue radiaplex. Add hydrocortisone prn.

## 2015-03-06 ENCOUNTER — Ambulatory Visit
Admission: RE | Admit: 2015-03-06 | Discharge: 2015-03-06 | Disposition: A | Discharge status: Home / Self Care | Payer: 59 | Source: Ambulatory Visit | Attending: Radiation Oncology | Admitting: Radiation Oncology

## 2015-03-06 DIAGNOSIS — Z510 Encounter for antineoplastic radiation therapy: Secondary | ICD-10-CM | POA: Diagnosis not present

## 2015-03-07 ENCOUNTER — Ambulatory Visit
Admission: RE | Admit: 2015-03-07 | Discharge: 2015-03-07 | Disposition: A | Discharge status: Home / Self Care | Payer: 59 | Source: Ambulatory Visit | Attending: Radiation Oncology | Admitting: Radiation Oncology

## 2015-03-07 DIAGNOSIS — Z510 Encounter for antineoplastic radiation therapy: Secondary | ICD-10-CM | POA: Diagnosis not present

## 2015-03-08 ENCOUNTER — Ambulatory Visit
Admission: RE | Admit: 2015-03-08 | Discharge: 2015-03-08 | Disposition: A | Discharge status: Home / Self Care | Payer: 59 | Source: Ambulatory Visit | Attending: Radiation Oncology | Admitting: Radiation Oncology

## 2015-03-08 DIAGNOSIS — Z510 Encounter for antineoplastic radiation therapy: Secondary | ICD-10-CM | POA: Diagnosis not present

## 2015-03-11 ENCOUNTER — Ambulatory Visit
Admission: RE | Admit: 2015-03-11 | Discharge: 2015-03-11 | Disposition: A | Discharge status: Home / Self Care | Payer: 59 | Source: Ambulatory Visit | Attending: Radiation Oncology | Admitting: Radiation Oncology

## 2015-03-11 DIAGNOSIS — Z510 Encounter for antineoplastic radiation therapy: Secondary | ICD-10-CM | POA: Diagnosis not present

## 2015-03-12 ENCOUNTER — Ambulatory Visit
Admission: RE | Admit: 2015-03-12 | Discharge: 2015-03-12 | Disposition: A | Discharge status: Home / Self Care | Payer: 59 | Source: Ambulatory Visit | Attending: Radiation Oncology | Admitting: Radiation Oncology

## 2015-03-12 DIAGNOSIS — C50411 Malignant neoplasm of upper-outer quadrant of right female breast: Secondary | ICD-10-CM

## 2015-03-12 DIAGNOSIS — Z510 Encounter for antineoplastic radiation therapy: Secondary | ICD-10-CM | POA: Diagnosis not present

## 2015-03-12 NOTE — Progress Notes (Signed)
Weekly Management Note Current Dose: 30.6  Gy  Projected Dose: 61 Gy   Narrative:  The patient presents for routine under treatment assessment.  CBCT/MVCT images/Port film x-rays were reviewed.  The chart was checked. Doing well. Irritation under breast. "stressed" at work.   Physical Findings: Weight:  . Slightly pink breast. Dermatitis medially.   Impression:  The patient is tolerating radiation.  Plan:  Continue treatment as planned. Continue radiaplex. Add hydrocortisone prn. Add neosporin in inframammary fold prn.

## 2015-03-13 ENCOUNTER — Ambulatory Visit
Admission: RE | Admit: 2015-03-13 | Discharge: 2015-03-13 | Disposition: A | Discharge status: Home / Self Care | Payer: 59 | Source: Ambulatory Visit | Attending: Radiation Oncology | Admitting: Radiation Oncology

## 2015-03-13 DIAGNOSIS — Z510 Encounter for antineoplastic radiation therapy: Secondary | ICD-10-CM | POA: Diagnosis not present

## 2015-03-14 ENCOUNTER — Ambulatory Visit
Admission: RE | Admit: 2015-03-14 | Discharge: 2015-03-14 | Disposition: A | Discharge status: Home / Self Care | Payer: 59 | Source: Ambulatory Visit | Attending: Radiation Oncology | Admitting: Radiation Oncology

## 2015-03-14 ENCOUNTER — Encounter: Payer: Self-pay | Admitting: Radiation Oncology

## 2015-03-14 DIAGNOSIS — Z510 Encounter for antineoplastic radiation therapy: Secondary | ICD-10-CM | POA: Diagnosis not present

## 2015-03-15 ENCOUNTER — Ambulatory Visit
Admission: RE | Admit: 2015-03-15 | Discharge: 2015-03-15 | Disposition: A | Discharge status: Home / Self Care | Payer: 59 | Source: Ambulatory Visit | Attending: Radiation Oncology | Admitting: Radiation Oncology

## 2015-03-15 DIAGNOSIS — Z510 Encounter for antineoplastic radiation therapy: Secondary | ICD-10-CM | POA: Diagnosis not present

## 2015-03-18 ENCOUNTER — Encounter: Payer: Self-pay | Admitting: Radiation Oncology

## 2015-03-18 ENCOUNTER — Ambulatory Visit
Admission: RE | Admit: 2015-03-18 | Discharge: 2015-03-18 | Disposition: A | Discharge status: Home / Self Care | Payer: 59 | Source: Ambulatory Visit | Attending: Radiation Oncology | Admitting: Radiation Oncology

## 2015-03-18 VITALS — BP 109/55 | HR 61 | Temp 97.6°F | Ht 63.5 in | Wt 191.0 lb

## 2015-03-18 DIAGNOSIS — C50411 Malignant neoplasm of upper-outer quadrant of right female breast: Secondary | ICD-10-CM | POA: Insufficient documentation

## 2015-03-18 DIAGNOSIS — Z923 Personal history of irradiation: Secondary | ICD-10-CM | POA: Insufficient documentation

## 2015-03-18 DIAGNOSIS — Z510 Encounter for antineoplastic radiation therapy: Secondary | ICD-10-CM | POA: Diagnosis not present

## 2015-03-18 MED ORDER — RADIAPLEXRX EX GEL
Freq: Once | CUTANEOUS | Status: AC
  Administered 2015-03-18: 08:00:00 via TOPICAL

## 2015-03-18 MED ORDER — SONAFINE EX EMUL
1.0000 "application " | Freq: Once | CUTANEOUS | Status: AC
  Administered 2015-03-18: 1 via TOPICAL
  Filled 2015-03-18: qty 45

## 2015-03-18 NOTE — Progress Notes (Signed)
Taylor Oconnor has completed 21 fractions to her right breast.  She reports having joint pain and pain at night under her right arm.  She reports having fatigue.  She is using radiapex and neosporin.  The skin on her right breast has hyperpigmentation and redness.  She also has dermatitis on her upper right breast that she reports it is very itchy.  Gave her a refill of radiaplex and also sonafine to try on the itchy area.  BP 109/55 mmHg  Pulse 61  Temp(Src) 97.6 F (36.4 C) (Oral)  Ht 5' 3.5" (1.613 m)  Wt 191 lb (86.637 kg)  BMI 33.30 kg/m2

## 2015-03-18 NOTE — Progress Notes (Signed)
Weekly Management Note   ICD-9-CM ICD-10-CM   1. Breast cancer of upper-outer quadrant of right female breast (HCC) 174.4 C50.411 hyaluronate sodium (RADIAPLEXRX) gel     SONAFINE emulsion 1 application    Current Dose: 37.8 Gy  Projected Dose: 61 Gy   Narrative:  The patient presents for routine under treatment assessment.  CBCT/MVCT images/Port film x-rays were reviewed.  The chart was checked. Doing well. She reports having joint pain diffusly.  She reports having fatigue.  She is using radiapex and neosporin at IM fold.    She states UIQ of right breast is very itchy.  Elmo Putt gave her a refill of radiaplex and also sonafine to try on the itchy area.  Physical Findings: Weight: 191 lb (86.637 kg).Right breast erythematous w/ Dermatitis of UIQ; punctate desquamation at IM fold.  Axilla hyperpigmented on right.  Impression:  The patient is tolerating radiation.  Plan:  Continue treatment as planned. Try sonafine over breast instead of radiaplex.  Use hydrocortisone prn in itchy areas. Continue neosporin in inframammary fold.  Offered massage voucher at [facility] for diffuse joint pain. She declines.  She state she is not on new meds. Advised her to talk to PCP to see if this is arthritis. -----------------------------------  Eppie Gibson, MD

## 2015-03-19 ENCOUNTER — Ambulatory Visit
Admission: RE | Admit: 2015-03-19 | Discharge: 2015-03-19 | Disposition: A | Discharge status: Home / Self Care | Payer: 59 | Source: Ambulatory Visit | Attending: Radiation Oncology | Admitting: Radiation Oncology

## 2015-03-19 DIAGNOSIS — Z510 Encounter for antineoplastic radiation therapy: Secondary | ICD-10-CM | POA: Diagnosis not present

## 2015-03-20 ENCOUNTER — Ambulatory Visit
Admission: RE | Admit: 2015-03-20 | Discharge: 2015-03-20 | Disposition: A | Discharge status: Home / Self Care | Payer: 59 | Source: Ambulatory Visit | Attending: Radiation Oncology | Admitting: Radiation Oncology

## 2015-03-20 DIAGNOSIS — Z510 Encounter for antineoplastic radiation therapy: Secondary | ICD-10-CM | POA: Diagnosis not present

## 2015-03-21 ENCOUNTER — Ambulatory Visit
Admission: RE | Admit: 2015-03-21 | Discharge: 2015-03-21 | Disposition: A | Discharge status: Home / Self Care | Payer: 59 | Source: Ambulatory Visit | Attending: Radiation Oncology | Admitting: Radiation Oncology

## 2015-03-21 DIAGNOSIS — Z510 Encounter for antineoplastic radiation therapy: Secondary | ICD-10-CM | POA: Diagnosis not present

## 2015-03-22 ENCOUNTER — Ambulatory Visit
Admission: RE | Admit: 2015-03-22 | Discharge: 2015-03-22 | Disposition: A | Discharge status: Home / Self Care | Payer: 59 | Source: Ambulatory Visit | Attending: Radiation Oncology | Admitting: Radiation Oncology

## 2015-03-22 DIAGNOSIS — Z510 Encounter for antineoplastic radiation therapy: Secondary | ICD-10-CM | POA: Diagnosis not present

## 2015-03-25 ENCOUNTER — Ambulatory Visit
Admission: RE | Admit: 2015-03-25 | Discharge: 2015-03-25 | Disposition: A | Discharge status: Home / Self Care | Payer: 59 | Source: Ambulatory Visit | Attending: Radiation Oncology | Admitting: Radiation Oncology

## 2015-03-25 DIAGNOSIS — Z510 Encounter for antineoplastic radiation therapy: Secondary | ICD-10-CM | POA: Diagnosis not present

## 2015-03-26 ENCOUNTER — Ambulatory Visit
Admission: RE | Admit: 2015-03-26 | Discharge: 2015-03-26 | Disposition: A | Discharge status: Home / Self Care | Payer: 59 | Source: Ambulatory Visit | Attending: Radiation Oncology | Admitting: Radiation Oncology

## 2015-03-26 ENCOUNTER — Encounter: Payer: Self-pay | Admitting: Radiation Oncology

## 2015-03-26 VITALS — BP 118/60 | HR 66 | Temp 97.5°F | Ht 63.5 in | Wt 191.9 lb

## 2015-03-26 DIAGNOSIS — Z923 Personal history of irradiation: Secondary | ICD-10-CM | POA: Insufficient documentation

## 2015-03-26 DIAGNOSIS — C50411 Malignant neoplasm of upper-outer quadrant of right female breast: Secondary | ICD-10-CM | POA: Diagnosis present

## 2015-03-26 DIAGNOSIS — Z510 Encounter for antineoplastic radiation therapy: Secondary | ICD-10-CM | POA: Diagnosis not present

## 2015-03-26 MED ORDER — SONAFINE EX EMUL
1.0000 "application " | Freq: Once | CUTANEOUS | Status: AC
  Administered 2015-03-26: 1 via TOPICAL
  Filled 2015-03-26: qty 45

## 2015-03-26 NOTE — Progress Notes (Signed)
[name] presents for her 27th fraction of radiation to her Right Breast. She reports some fatigue, she feels tired after running errands, and is occasionally taking naps during the day. Her Right breast is red and tender. She has some hyperpigmentation at her axilla area. There is a small amount of peeling underneath her breast. She is using Telfa pad underneath her breast to prevent rubbing.  She is using the sonafine twice daily in the morning and nighttime, and radiaplex in the middle of the day. I will provide her with some more sonafine today.   BP 118/60 mmHg  Pulse 66  Temp(Src) 97.5 F (36.4 C)  Ht 5' 3.5" (1.613 m)  Wt 191 lb 14.4 oz (87.045 kg)  BMI 33.46 kg/m2   Wt Readings from Last 3 Encounters:  03/26/15 191 lb 14.4 oz (87.045 kg)  03/18/15 191 lb (86.637 kg)  03/05/15 189 lb 3.2 oz (85.821 kg)

## 2015-03-26 NOTE — Progress Notes (Signed)
Weekly Management Note Current Dose: 15  Gy  Projected Dose: 50  Gy   Narrative:  The patient presents for routine under treatment assessment.  CBCT/MVCT images/Port film x-rays were reviewed.  The chart was checked. Doing well. Telfa under breast working well. Using sonafine and radiaplex. Some fatigue. Taking naps.   Physical Findings: Weight: 191 lb 14.4 oz (87.045 kg). Unchanged. Hyperpigmentation medially and in inframammary fold. No moist desquamation.   Impression:  The patient is tolerating radiation.  Plan:  Continue treatment as planned. Continue sonafine and radiaplex. Marland Kitchen Marland Kitchen

## 2015-03-27 ENCOUNTER — Ambulatory Visit
Admission: RE | Admit: 2015-03-27 | Discharge: 2015-03-27 | Disposition: A | Discharge status: Home / Self Care | Payer: 59 | Source: Ambulatory Visit | Attending: Radiation Oncology | Admitting: Radiation Oncology

## 2015-03-27 DIAGNOSIS — Z510 Encounter for antineoplastic radiation therapy: Secondary | ICD-10-CM | POA: Diagnosis not present

## 2015-03-28 ENCOUNTER — Ambulatory Visit
Admission: RE | Admit: 2015-03-28 | Discharge: 2015-03-28 | Disposition: A | Discharge status: Home / Self Care | Payer: 59 | Source: Ambulatory Visit | Attending: Radiation Oncology | Admitting: Radiation Oncology

## 2015-03-28 DIAGNOSIS — Z510 Encounter for antineoplastic radiation therapy: Secondary | ICD-10-CM | POA: Diagnosis not present

## 2015-03-29 ENCOUNTER — Ambulatory Visit
Admission: RE | Admit: 2015-03-29 | Discharge: 2015-03-29 | Disposition: A | Discharge status: Home / Self Care | Payer: 59 | Source: Ambulatory Visit | Attending: Radiation Oncology | Admitting: Radiation Oncology

## 2015-03-29 DIAGNOSIS — Z510 Encounter for antineoplastic radiation therapy: Secondary | ICD-10-CM | POA: Diagnosis not present

## 2015-04-01 ENCOUNTER — Ambulatory Visit
Admission: RE | Admit: 2015-04-01 | Discharge: 2015-04-01 | Disposition: A | Discharge status: Home / Self Care | Payer: 59 | Source: Ambulatory Visit | Attending: Radiation Oncology | Admitting: Radiation Oncology

## 2015-04-01 DIAGNOSIS — Z510 Encounter for antineoplastic radiation therapy: Secondary | ICD-10-CM | POA: Diagnosis not present

## 2015-04-02 ENCOUNTER — Ambulatory Visit
Admission: RE | Admit: 2015-04-02 | Discharge: 2015-04-02 | Disposition: A | Discharge status: Home / Self Care | Payer: 59 | Source: Ambulatory Visit | Attending: Radiation Oncology | Admitting: Radiation Oncology

## 2015-04-02 ENCOUNTER — Encounter: Payer: Self-pay | Admitting: Radiation Oncology

## 2015-04-02 VITALS — BP 110/66 | HR 64 | Temp 97.6°F | Resp 16 | Ht 63.5 in | Wt 193.9 lb

## 2015-04-02 DIAGNOSIS — Z510 Encounter for antineoplastic radiation therapy: Secondary | ICD-10-CM | POA: Diagnosis not present

## 2015-04-02 DIAGNOSIS — C50411 Malignant neoplasm of upper-outer quadrant of right female breast: Secondary | ICD-10-CM

## 2015-04-02 NOTE — Addendum Note (Signed)
Encounter addended by: Malena Edman, RN on: 04/02/2015  3:55 PM<BR>     Documentation filed: Inpatient Patient Education

## 2015-04-02 NOTE — Progress Notes (Signed)
Weekly Management Note Current Dose: 59  Gy  Projected Dose: 61  Gy   Narrative:  The patient presents for routine under treatment assessment.  CBCT/MVCT images/Port film x-rays were reviewed.  The chart was checked. Doing well. Telfa under breast working well. Using sonafine which has worked well.   Physical Findings: Weight: 193 lb 14.4 oz (87.952 kg). Unchanged. Hyperpigmentation medially and in inframammary fold. No moist desquamation.   Impression:  The patient is tolerating radiation.  Plan:  Continue treatment as planned. Continue sonafine. Discussed post RT skin care. Discussed survivorship.

## 2015-04-02 NOTE — Addendum Note (Signed)
Encounter addended by: Malena Edman, RN on: 04/02/2015  3:56 PM<BR>     Documentation filed: Notes Section

## 2015-04-02 NOTE — Addendum Note (Signed)
Encounter addended by: Malena Edman, RN on: 04/02/2015  2:46 PM<BR>     Documentation filed: Notes Section

## 2015-04-02 NOTE — Addendum Note (Signed)
Encounter addended by: Malena Edman, RN on: 04/02/2015  2:47 PM<BR>     Documentation filed: Inpatient Patient Education

## 2015-04-02 NOTE — Progress Notes (Addendum)
Taylor Oconnor has received 32 fractions to her right breast.  Skin to right breast with hyperpigmentation using Radiaplex gel.  Appetite is too good.  Energy level comes and goes feels better this week.  Denies pain in her right breast. EOT skin care discussed to continue use of Radiplex for two weeks until redness goes away.  Then start lotion with vitamin E.  Asked if she has an appointment with her medical oncologist 04-22-15.  Discussed that the survivorship nurse Chestine Spore will meet with you a scheduler will  call to schedule the appointment.  Reading material given for survivorship,[employer] and [employer].   Given a F/U appointment card to come back in one month to see Dr. Pablo Ledger.  Patient was able to verbalize that she understood the instructions and will call with any nursing care questions. BP 110/66 mmHg  Pulse 64  Temp(Src) 97.6 F (36.4 C) (Oral)  Resp 16  Ht 5' 3.5" (1.613 m)  Wt 193 lb 14.4 oz (87.952 kg)  BMI 33.80 kg/m2  SpO2 97%

## 2015-04-03 ENCOUNTER — Ambulatory Visit
Admission: RE | Admit: 2015-04-03 | Discharge: 2015-04-03 | Disposition: A | Discharge status: Home / Self Care | Payer: 59 | Source: Ambulatory Visit | Attending: Radiation Oncology | Admitting: Radiation Oncology

## 2015-04-03 ENCOUNTER — Encounter: Payer: Self-pay | Admitting: Radiation Oncology

## 2015-04-03 DIAGNOSIS — Z510 Encounter for antineoplastic radiation therapy: Secondary | ICD-10-CM | POA: Diagnosis not present

## 2015-04-04 ENCOUNTER — Telehealth: Payer: Self-pay | Admitting: *Deleted

## 2015-04-04 NOTE — Telephone Encounter (Signed)
Spoke with patient to follow up post radiation.  She states she is doing well just having some fatigue.  Discussed survivorship program.  Encouraged her to call with any needs or concerns.

## 2015-04-10 ENCOUNTER — Other Ambulatory Visit: Payer: Self-pay | Admitting: Adult Health

## 2015-04-10 ENCOUNTER — Telehealth: Payer: Self-pay | Admitting: Nurse Practitioner

## 2015-04-10 DIAGNOSIS — C50411 Malignant neoplasm of upper-outer quadrant of right female breast: Secondary | ICD-10-CM

## 2015-04-10 NOTE — Telephone Encounter (Signed)
cld & spoke to pt and gave pt time & date of 5/19 appt @11 

## 2015-04-10 NOTE — Progress Notes (Signed)
  Radiation Oncology         [phone] ________________________________  Name: Taylor Oconnor MRN: [id number]  Date: 04/03/2015  DOB: [birth year]  End of Treatment Note  Diagnosis:   Breast cancer of upper-outer quadrant of right female breast [facility])   Staging form: Breast, AJCC 7th Edition     Clinical stage from 12/12/2014: Stage IA (T1b, N0, M0) - Unsigned       Staging comments: Staged at breast conference on 11.30.16      Pathologic stage from 12/31/2014: Stage IA (T1c(m), N0, cM0) - Unsigned       Staging comments: Staged on final surgical specimen by Dr. Lyndon Code    Indication for treatment: Curative    Radiation treatment dates:   02/18/2015-04/03/2015  Site/dose:    1. Right breast / 45 Gray at 1.8 Gray per fraction x 25 fractions 2. Right breast boost / 16 Gray at [employer] per fraction x 8 fractions  Beams/energy:  1. Tangents / 10X 2. 3 field / 10X, 6X  Narrative: The patient tolerated radiation treatment relatively well.  The patient experienced hyperpigmentation medially and in the inframammary fold with no moist desquamation. She was able to work during treatment.   Plan: The patient has completed radiation treatment. The patient will return to radiation oncology clinic for routine followup in one month. I advised them to call or return sooner if they have any questions or concerns related to their recovery or treatment.  ------------------------------------------------  Thea Silversmith, MD  This document serves as a record of services personally performed by Thea Silversmith, MD. It was created on her behalf by Arlyce Harman, a trained medical scribe. The creation of this record is based on the scribe's personal observations and the provider's statements to them. This document has been checked and approved by the attending provider.

## 2015-04-17 ENCOUNTER — Other Ambulatory Visit: Payer: Self-pay

## 2015-04-17 DIAGNOSIS — C50411 Malignant neoplasm of upper-outer quadrant of right female breast: Secondary | ICD-10-CM

## 2015-04-18 ENCOUNTER — Other Ambulatory Visit (HOSPITAL_BASED_OUTPATIENT_CLINIC_OR_DEPARTMENT_OTHER): Payer: 59

## 2015-04-18 DIAGNOSIS — C50411 Malignant neoplasm of upper-outer quadrant of right female breast: Secondary | ICD-10-CM

## 2015-04-18 LAB — COMPREHENSIVE METABOLIC PANEL
ALK PHOS: 41 U/L (ref 40–150)
ALT: 25 U/L (ref 0–55)
ANION GAP: 8 meq/L (ref 3–11)
AST: 23 U/L (ref 5–34)
Albumin: 3.7 g/dL (ref 3.5–5.0)
BUN: 22.4 mg/dL (ref 7.0–26.0)
CALCIUM: 9.3 mg/dL (ref 8.4–10.4)
CHLORIDE: 105 meq/L (ref 98–109)
CO2: 27 mEq/L (ref 22–29)
CREATININE: 0.9 mg/dL (ref 0.6–1.1)
EGFR: 65 mL/min/{1.73_m2} — ABNORMAL LOW (ref >90)
Glucose: 94 mg/dl (ref 70–140)
POTASSIUM: 4.2 meq/L (ref 3.5–5.1)
Sodium: 140 mEq/L (ref 136–145)
Total Bilirubin: 0.43 mg/dL (ref 0.20–1.20)
Total Protein: 6.7 g/dL (ref 6.4–8.3)

## 2015-04-18 LAB — CBC WITH DIFFERENTIAL/PLATELET
BASO%: 1.2 % (ref 0.0–2.0)
BASOS ABS: 0.1 10*3/uL (ref 0.0–0.1)
EOS%: 2.2 % (ref 0.0–7.0)
Eosinophils Absolute: 0.1 10*3/uL (ref 0.0–0.5)
HEMATOCRIT: 40.5 % (ref 34.8–46.6)
HGB: 13.6 g/dL (ref 11.6–15.9)
LYMPH#: 1 10*3/uL (ref 0.9–3.3)
LYMPH%: 21.4 % (ref 14.0–49.7)
MCH: 31 pg (ref 25.1–34.0)
MCHC: 33.6 g/dL (ref 31.5–36.0)
MCV: 92.2 fL (ref 79.5–101.0)
MONO#: 0.4 10*3/uL (ref 0.1–0.9)
MONO%: 9.4 % (ref 0.0–14.0)
NEUT#: 3.1 10*3/uL (ref 1.5–6.5)
NEUT%: 65.8 % (ref 38.4–76.8)
PLATELETS: 301 10*3/uL (ref 145–400)
RBC: 4.4 10*6/uL (ref 3.70–5.45)
RDW: 13.3 % (ref 11.2–14.5)
WBC: 4.7 10*3/uL (ref 3.9–10.3)

## 2015-04-22 ENCOUNTER — Telehealth: Payer: Self-pay | Admitting: Oncology

## 2015-04-22 ENCOUNTER — Ambulatory Visit (HOSPITAL_BASED_OUTPATIENT_CLINIC_OR_DEPARTMENT_OTHER): Payer: 59 | Admitting: Oncology

## 2015-04-22 VITALS — BP 123/65 | HR 70 | Temp 97.5°F | Resp 18 | Ht 63.5 in | Wt 193.3 lb

## 2015-04-22 DIAGNOSIS — C50411 Malignant neoplasm of upper-outer quadrant of right female breast: Secondary | ICD-10-CM | POA: Diagnosis not present

## 2015-04-22 MED ORDER — ANASTROZOLE 1 MG PO TABS: 1.0000 mg | ORAL_TABLET | Freq: Every day | ORAL | Status: DC

## 2015-04-22 NOTE — Telephone Encounter (Signed)
appt made and avs printed °

## 2015-04-22 NOTE — Progress Notes (Signed)
[name]  [phone] [phone]     ID: DANELLE CURIALE DOB: 03-Jun-1950  MR#: [id number]  QHU#:[id number]  Patient Care Team: Unk Pinto, MD as PCP - General (Internal Medicine) Alphonsa Overall, MD as Consulting Physician (General Surgery) Chauncey Cruel, MD as Consulting Physician (Oncology) Thea Silversmith, MD as Consulting Physician (Radiation Oncology) Sylvan Cheese, NP as Nurse Practitioner (Hematology and Oncology) PCP: Alesia Richards, MD OTHER MD:  CHIEF COMPLAINT: Estrogen receptor positive breast cancer  CURRENT TREATMENT:  anastrozole   BREAST CANCER HISTORY: From the original intake note:  Zanyah's had routine bilateral digital screening mammography 11/16/2014 at the breast Center, showing a possible mass in the right breast. Right diagnostic mammography with tomosynthesis and right breast ultrasonography 11/27/2014 showed the breast density to be category B. There was a persistent spiculated mass in the upper outer right breast measuring 6 mm. Posterior to that there was a second mass measuring 1.6 cm. There was no palpable mass by exam. Ultrasonography confirmed a 0.6 cm irregular hypoechoic mass in the right breast at 10:00 7 cm from the nipple and another measuring 0.6 cm also in the 10:00 location but 9 cm from the nipple. The 2 masses were separated by approximately 2.9 cm. The axilla was sonographically benign.  On 11/28/2014 the patient underwent biopsy of the 2 masses in question the more posterior mass at 9 cm from the nipple was an invasive ductal carcinoma, grade 1, estrogen receptor 100% positive, progesterone receptor 85% positive, with an MIB-1 of 10% and no HER-2 amplification, the signals ratio being 1.41 and the number per cell 2.05.  The more anterior mass (measuring 0.6 cm) also showed an invasive ductal carcinoma, grade 1, estrogen receptor 100% positive, but progesterone receptor negative. The MIB-1  was 5%. This was also HER-2 negative, with a signals ratio 1.29 and the number per cell 2.00.  The patient's subsequent history is as detailed below  INTERVAL HISTORY: Alisah returns today for follow-up of her estrogen receptor positive breast cancer. Since her last visit here she underwent right lumpectomy and sentinel lymph node sampling on 12/28/2014. The final pathology ([id number]) confirmed a invasive ductal carcinoma in 2 foci, 1 measuring 1.5 cm and the other 0.7 cm. Both were grade 2. Repeat HER-2 was again negative with a signals ratio of 1.13 and number per cell of 1.70. Both sentinel lymph nodes were clear. In addition we obtained an Oncotype DX score on this sample. It was 16, predicting a 10% risk of recurrence outside the breast within 10 years if the patient's only systemic therapy was tamoxifen for 5 years. It also predicted no benefit from chemotherapy.  Given these numbers the patient proceeded directly to radiation, which was completed 04/03/2015. She is here today to discuss anti-estrogens  REVIEW OF SYSTEMS: Lorely did remarkably well with her surgery, although she had to sleep in her bra for a couple of weeks because he was "pulling" and uncomfortable way. She then did well with the radiation except that she had some hyperpigmentation and she remains very tired. She just returned back to work. Aside from the fatigue, she has chronic insomnia problems. She falls asleep easily with alprazolam but then wakes up several times a night. She has night sweats. She also has hot flashes. Aside from those issues she has chronic ringing in her ears, occasional ankle swelling, and joint pains here and there which are not more intense or persistent now than before. A detailed review of systems  today was otherwise stable  PAST MEDICAL HISTORY: Past Medical History  Diagnosis Date  . Hypertension   . H/O hiatal hernia     resolved with band surgery  . Difficult intubation pt has large  tonsils,  . At risk for difficult insertion of breathing tube   . Insomnia     tx with zanax  . Anxiety   . GERD (gastroesophageal reflux disease)     hx - resolved with lap band surgery  . Hyperlipidemia   . Breast cancer of upper-outer quadrant of right female breast ([place]) 12/03/2014  . Breast cancer ([place])   . Allergy     SEASONAL  . Type II or unspecified type diabetes mellitus without mention of complication, not stated as uncontrolled 12/14/2012    no meds, diet controlled  . 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 Cholecystectomy    . Diagnostic laparoscopy    . Hernia repair  2008  . Tubal ligation    . Back surgery      cervical  . Laparoscopic appendectomy N/A 08/06/2012    Procedure: APPENDECTOMY LAPAROSCOPIC;  Surgeon: Earnstine Regal, MD;  Location: [facility];  Service: General;  Laterality: N/A;  . Appendectomy  08/06/12    lap appy  . Laparoscopic gastric banding with hiatal hernia repair N/A Nov 2008  . Colonoscopy  09/29/2004    normal  . Cesarean section      x 1  . Breast lumpectomy with needle localization and axillary sentinel lymph node bx Right 12/28/2014    Procedure: RIGHT BREAST LUMPECTOMY WITH TWO (2) NEEDLE LOCALIZATION AND AXILLARY SENTINEL LYMPH NODE BX;  Surgeon: Alphonsa Overall, MD;  Location: [facility];  Service: General;  Laterality: Right;    FAMILY HISTORY Family History  Problem Relation Age of Onset  . Cancer Cousin     Breast  . Cirrhosis Mother   . Alcohol abuse Mother   . Liver disease Mother   . Diabetes Father   . Hypertension Father   . Stroke Father   . Heart disease Father   . AAA (abdominal aortic aneurysm) Sister   . Colon cancer Neg Hx   . Rectal cancer Neg Hx   . Stomach cancer Neg Hx    the patient's father died from a stroke at age 24. The patient's mother died from cirrhosis of the liver at age 50. The patient had no  brothers. She had 2 sisters, one of whom died from a ruptured aortic aneurysm. There is no history of breast or ovarian cancer in the family to the patient's knowledge  GYNECOLOGIC HISTORY:  No LMP recorded. Patient is postmenopausal. Menarche age 55, first live birth age 52. The patient is GX P1. She stopped having periods in 2002. She did not take hormone replacement. She used oral contraceptives for 11 years remotely, with no complications.  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 Her husband Mikki Santee is self-employed as a Herbalist area did daughter Warden/ranger lives in [place]. She is currently unemployed. She is divorced. The patient's grand daughter, 49 years old, lives in [place] with the child's father. The patient is not a church attender   ADVANCED DIRECTIVES: Not in place   HEALTH MAINTENANCE: Social History  Substance Use Topics  . Smoking status: Never Smoker   . Smokeless tobacco: Never Used  .  Alcohol Use: No     Colonoscopy: DEC 2016  PAP:  Bone density:  Lipid panel:  Allergies  Allergen Reactions  . Codeine Itching  . Lipitor [Atorvastatin] Other (See Comments)    Severe fatigue. ? memory loss.  . Ace Inhibitors     cough    Current Outpatient Prescriptions  Medication Sig Dispense Refill  . acidophilus (RISAQUAD) CAPS Take 1 capsule by mouth daily. Reported on 02/19/2015    . ALPRAZolam (XANAX) 1 MG tablet TAKE 1/2 TO 1 TABLET 3 TIMES A DAY AS NEEDED 90 tablet 5  . anastrozole (ARIMIDEX) 1 MG tablet Take 1 tablet (1 mg total) by mouth daily. 90 tablet 4  . aspirin 81 MG tablet Take 81 mg by mouth daily.    Marland Kitchen atenolol (TENORMIN) 100 MG tablet TAKE 1 TABLET EVERY DAY 90 tablet 1  . b complex vitamins capsule Take 1 capsule by mouth daily.    . calcium carbonate (TUMS) 500 MG chewable tablet Chew 1,000 mg by mouth at bedtime.     . Cholecalciferol (VITAMIN D3) 5000 UNITS CAPS Take 5,000  Units by mouth daily.    . Cinnamon 500 MG TABS Take 1 tablet by mouth 2 (two) times daily. Cinnamon 1000 mg, 1 tab in morning and 1 tab evening    . fenofibrate micronized (LOFIBRA) 134 MG capsule TAKE ONE CAPSULE BY MOUTH EVERY DAY 30 capsule 5  . Magnesium 250 MG TABS Take 250 mg by mouth 2 (two) times daily.    . Multiple Vitamins-Minerals (MULTIVITAMIN WITH MINERALS) tablet Take 1 tablet by mouth 2 (two) times daily.    . Omega-3 Fatty Acids (FISH OIL) 1000 MG CAPS Take 1,000 mg by mouth daily.    Marland Kitchen OVER THE COUNTER MEDICATION Take 1 tablet by mouth 2 (two) times daily. Tumeric with black pepper    . PROCTOSOL HC 2.5 % rectal cream APPLY 1 APPLICATORFUL RECTALLY 4 TIMES DAILY AS DIRECTED 56.7 g 1  . verapamil (CALAN) 80 MG tablet TAKE 1 TABLET BY MOUTH TWICE A DAY FOR BLOOD PRESSURE 180 tablet 3   No current facility-administered medications for this visit.    OBJECTIVE: Middle-aged white woman In no acute distress Filed Vitals:   04/22/15 1112  BP: 123/65  Pulse: 70  Temp: 97.5 F (36.4 C)  Resp: 18     Body mass index is 33.7 kg/(m^2).    ECOG FS:1 - Symptomatic but completely ambulatory  Sclerae unicteric, pupils round and equal Oropharynx clear and moist-- no thrush or other lesions No cervical or supraclavicular adenopathy Lungs no rales or rhonchi Heart regular rate and rhythm Abd soft, obese, nontender, positive bowel sounds MSK no focal spinal tenderness, no upper extremity lymphedema Neuro: nonfocal, well oriented, appropriate affect Breasts: The right breast is status post lumpectomy and radiation. There is mild residual hyperpigmentation. The incision has healed nicely, without dehiscence, erythema, or swelling. The overall cosmetic result is very good. There is no evidence of local recurrence. The right axilla is benign. The left breast is unremarkable  LAB RESULTS:  CMP     Component Value Date/Time   NA 140 04/18/2015 1058   NA 139 01/24/2015 1657   K 4.2  04/18/2015 1058   K 4.6 01/24/2015 1657   CL 102 01/24/2015 1657   CO2 27 04/18/2015 1058   CO2 28 01/24/2015 1657   GLUCOSE 94 04/18/2015 1058   GLUCOSE 90 01/24/2015 1657   BUN 22.4 04/18/2015 1058   BUN 17 01/24/2015  1657   CREATININE 0.9 04/18/2015 1058   CREATININE 1.02* 01/24/2015 1657   CREATININE 0.83 08/06/2012 0815   CALCIUM 9.3 04/18/2015 1058   CALCIUM 9.4 01/24/2015 1657   PROT 6.7 04/18/2015 1058   PROT 6.5 01/24/2015 1657   ALBUMIN 3.7 04/18/2015 1058   ALBUMIN 4.1 01/24/2015 1657   AST 23 04/18/2015 1058   AST 28 01/24/2015 1657   ALT 25 04/18/2015 1058   ALT 32* 01/24/2015 1657   ALKPHOS 41 04/18/2015 1058   ALKPHOS 38 01/24/2015 1657   BILITOT 0.43 04/18/2015 1058   BILITOT 0.3 01/24/2015 1657   GFRNONAA 59* 01/24/2015 1657   GFRNONAA 75* 08/06/2012 0815   GFRAA 68 01/24/2015 1657   GFRAA 87* 08/06/2012 0815    INo results found for: SPEP, UPEP  Lab Results  Component Value Date   WBC 4.7 04/18/2015   NEUTROABS 3.1 04/18/2015   HGB 13.6 04/18/2015   HCT 40.5 04/18/2015   MCV 92.2 04/18/2015   PLT 301 04/18/2015      Chemistry      Component Value Date/Time   NA 140 04/18/2015 1058   NA 139 01/24/2015 1657   K 4.2 04/18/2015 1058   K 4.6 01/24/2015 1657   CL 102 01/24/2015 1657   CO2 27 04/18/2015 1058   CO2 28 01/24/2015 1657   BUN 22.4 04/18/2015 1058   BUN 17 01/24/2015 1657   CREATININE 0.9 04/18/2015 1058   CREATININE 1.02* 01/24/2015 1657   CREATININE 0.83 08/06/2012 0815      Component Value Date/Time   CALCIUM 9.3 04/18/2015 1058   CALCIUM 9.4 01/24/2015 1657   ALKPHOS 41 04/18/2015 1058   ALKPHOS 38 01/24/2015 1657   AST 23 04/18/2015 1058   AST 28 01/24/2015 1657   ALT 25 04/18/2015 1058   ALT 32* 01/24/2015 1657   BILITOT 0.43 04/18/2015 1058   BILITOT 0.3 01/24/2015 1657       No results found for: LABCA2  No components found for: LABCA125  No results for input(s): INR in the last 168 hours.  Urinalysis      Component Value Date/Time   COLORURINE YELLOW 01/24/2015 1657   APPEARANCEUR CLEAR 01/24/2015 1657   LABSPEC 1.019 01/24/2015 1657   PHURINE 7.5 01/24/2015 1657   GLUCOSEU NEGATIVE 01/24/2015 1657   HGBUR NEGATIVE 01/24/2015 1657   BILIRUBINUR NEGATIVE 01/24/2015 1657   KETONESUR NEGATIVE 01/24/2015 1657   PROTEINUR NEGATIVE 01/24/2015 1657   UROBILINOGEN 0.2 05/30/2013 0903   NITRITE NEGATIVE 01/24/2015 1657   LEUKOCYTESUR NEGATIVE 01/24/2015 1657    STUDIES: No results found.  ASSESSMENT: 65 y.o. [place] woman status post biopsy 11/28/2014 of 2 separate right breast masses, both invasive ductal carcinomas, grade 1, estrogen receptor positive, and HER-2 not amplified. MIB-1 is 5-10%. The smaller mass is progesterone receptor negative  (1) Status post right lumpectomy and sentinel lymph node sampling 12/28/2014 for an mpT1c pN0, stage IA invasive ductal carcinoma, grade 2, repeat HER-2 again negative  (2) Oncotype score of 16 predict sent outside the breast risk of recurrence within 10 years of 10% if the patient's only systemic therapy is tamoxifen for 5 years. It also predicts no benefit from adjuvant chemotherapy.  (3) adjuvant radiation 02/18/2015-04/03/2015:  1. Right breast / 45 Gray at 1.8 Gray per fraction x 25 fractions 2. Right breast boost / 16 Gray at [employer] per fraction x 8 fractions  (4) Anastrozole to be started 05/13/2015  (a) bone density 01/18/2015 was normal with a T  score of 0.4  PLAN I spent approximately 50 minutes today with Nimsi going over her situation. She has completed her local treatment, namely surgery and radiation, and I gave her a copy of her pathology report and Oncotype DX report.: She understands her risk of this cancer coming back in her breast is now very low, in the 10% or so range.  Of course she still has done nothing with regards to systemic recurrence. If a breast cancer cell had moved from her breast into her blood before she had her  surgery and lodged in her liver, then that Would Still Be in Her Liver and the Local Treatment She Has Had Would Not Affect That.  That Is Why She Needs Systemic Therapy. Of Course the 2 Main Forms of Systemic Therapy are Chemotherapy and Anti-Estrogens. The Oncotype Tells [place] However That Chemotherapy would be of marginal if any benefit in her case and therefore we did not recommend. The Oncotype also tells [place] that if she takes tamoxifen for 5 years her risk of recurrence outside the breast within the next 10 years will be 10%.  The reason all the women in this database took tamoxifen for 5 years is that 15 years ago when this data was gathered there was no other option. Now of course we have aromatase inhibitors available. They are somewhat more effective than tamoxifen so that if instead of taking tamoxifen for 5 years she takes anastrozole for 5 years her risk of outside the breast recurrence would drop to about 7%.  We then discussed the possible toxicities, side effects and complications of both tamoxifen and anastrozole. After very thorough discussion she likes the idea of starting with anastrozole and this is very sensible since she had a bone density earlier this year which was normal.  I think it would be useful if she recovered a little bit more from her radiation treatments. Accordingly we will not start the anastrozole until 05/13/2015. I'm going to see her about 2 months later. She tolerates it well then we will do anastrozole and minimum of 2 years (after which we could consider switching to tamoxifen) or up to 5 years. If she has any of the side effects that she may develop from this agent she will call and let us know  Incidentally a Jeannine tells me that she has polycystic kidneys and a tendency to make too much testosterone. In men treated with anastrozole the testosterone level does rise. I will read up on this and also we will check a testosterone level before her next visit  here.  Otherwise she will see me again mid July. She knows to call for any other issues that may develop before that visit.   Chauncey Cruel, MD   04/22/2015 11:51 AM Medical Oncology and Hematology [facility] [address] [place], [place] [address] Tel. [phone]    Fax. [phone]

## 2015-04-24 ENCOUNTER — Other Ambulatory Visit: Payer: Self-pay | Admitting: Internal Medicine

## 2015-05-01 ENCOUNTER — Other Ambulatory Visit: Payer: Self-pay | Admitting: Internal Medicine

## 2015-05-01 DIAGNOSIS — F411 Generalized anxiety disorder: Secondary | ICD-10-CM

## 2015-05-02 NOTE — Progress Notes (Signed)
Name: [name]   MRN: [id number]  Date:  03/14/15   DOB: 04/26/1950  Status:outpatient    DIAGNOSIS: Breast cancer of upper-outer quadrant of right female breast [facility])   Staging form: Breast, AJCC 7th Edition     Clinical stage from 12/12/2014: Stage IA (T1b, N0, M0) - Unsigned       Staging comments: Staged at breast conference on 11.30.16      Pathologic stage from 12/31/2014: Stage IA (T1c(m), N0, cM0) - Unsigned       Staging comments: Staged on final surgical specimen by Dr. Lyndon Code    CONSENT VERIFIED: yes   SET UP: Patient is setup supine   IMMOBILIZATION:  The following immobilization was used:Custom Moldable Pillow, breast board.   NARRATIVE: [name] underwent complex simulation and treatment planning for her boost treatment today.  Her tumor volume was outlined on the planning CT scan.  Due to the depth of her cavity, electrons could not be used and a photon plan was developed. The plan will be prescribed to the  isodose line.   I personally supervised and approved the creation of 3 unique MLCs comprising 3   treatment devices.

## 2015-05-02 NOTE — Progress Notes (Signed)
Taylor Oconnor is here for a one month  follow up visit for breast cancer upper-outer quadrant right female breast.  Skin status:Right breast normal color under axilla still with slight tanning with some numbness. Lotion being used: Vitamin E Have you seen your medical oncologist? Date If not ,when is appointment Dr. Sarajane Jews Magrinat 07-23-15 ER+,have started AI or Tamoxifen? If not, why? Anastrozole started 05-13-15 Discuss survivorship appointment:05-31-15  Taylor Oconnor Needs to reschedule Offer referral reading material for Survivorship, [employer] and [facility] given  04-02-15 Appetite:Too good Pain:None Arm movement:Able to move right arm without discomfort. Energy level:Having a little fatigue it has improved a lot since being back at work.

## 2015-05-09 ENCOUNTER — Ambulatory Visit
Admission: RE | Admit: 2015-05-09 | Discharge: 2015-05-09 | Disposition: A | Discharge status: Home / Self Care | Payer: 59 | Source: Ambulatory Visit | Attending: Radiation Oncology | Admitting: Radiation Oncology

## 2015-05-09 ENCOUNTER — Encounter: Payer: Self-pay | Admitting: Radiation Oncology

## 2015-05-09 VITALS — BP 116/63 | HR 70 | Temp 97.6°F | Resp 16 | Ht 63.5 in | Wt 193.7 lb

## 2015-05-09 DIAGNOSIS — C50411 Malignant neoplasm of upper-outer quadrant of right female breast: Secondary | ICD-10-CM

## 2015-05-09 NOTE — Progress Notes (Signed)
   Department of Radiation Oncology  Phone:  [phone] Fax:        [phone]   Name: Taylor Oconnor MRN: [id number]  DOB: May 19, 1950  Date: 05/09/2015  Follow Up Visit Note  Diagnosis: Breast cancer of upper-outer quadrant of right female breast [facility])   Staging form: Breast, AJCC 7th Edition     Clinical stage from 12/12/2014: Stage IA (T1b, N0, M0) - Unsigned       Staging comments: Staged at breast conference on 11.30.16      Pathologic stage from 12/31/2014: Stage IA (T1c(m), N0, cM0) - Unsigned       Staging comments: Staged on final surgical specimen by Dr. Lyndon Code    Indication for treatment: Curative    Radiation treatment dates:   02/18/2015-04/03/2015  Site/dose:    1. Right breast / 45 Gray at 1.8 Gray per fraction x 25 fractions 2. Right breast boost / 16 Gray at [employer] per fraction x 8 fractions  Interval History:   Taylor Oconnor is here for a one month follow up visit for breast cancer upper-outer quadrant right female breast.  -Skin status:Right breast normal color under axilla still with slight tanning with some numbness. -Lotion being used: Vitamin E -Have you seen your medical oncologist? Last visit 04/22/15 with Dr.Magrinat -Date If not ,when is appointment Dr. Sarajane Jews Magrinat 07-23-15 -ER+, Anastrozole to start on 05-13-15 -Discuss survivorship appointment:05-31-15 Taylor Oconnor Needs to reschedule -Offer referral reading material for Survivorship, [employer] and [facility] given 04-02-15 -Appetite:Too good -Pain:None -Arm movement:Able to move right arm without discomfort. -Energy level:Having a little fatigue it has improved a lot since being back at work.  Physical Exam:  Filed Vitals:   05/09/15 0807  BP: 116/63  Pulse: 70  Temp: 97.6 F (36.4 C)  TempSrc: Oral  Resp: 16  Height: 5' 3.5" (1.613 m)  Weight: 193 lb 11.2 oz (87.862 kg)  SpO2: 100%   -Skin on right breast is healing well, no areas of concern.  IMPRESSION: Twania is a 65 y.o. female  diagnosed with invasive ductal carcinoma of the right breast.  PLAN:  She is doing well. We discussed the need for follow up every 4-6 months which she has scheduled.  We discussed the need for yearly mammograms which she can schedule with her OBGYN or with medical oncology. We discussed the need for sun protection in the treated area.  She can always call me with questions.  I will follow up with her on an as needed basis.    She has a survivorship appointment on May19th.  ------------------------------------------------  Thea Silversmith, MD  This document serves as a record of services personally performed by Thea Silversmith, MD. It was created on her behalf by Derek Mound, a trained medical scribe. The creation of this record is based on the scribe's personal observations and the provider's statements to them. This document has been checked and approved by the attending provider.

## 2015-05-12 ENCOUNTER — Telehealth: Payer: Self-pay | Admitting: Oncology

## 2015-05-12 NOTE — Telephone Encounter (Signed)
s.w. pt and moved 7.11 appt to 7.17 due to md on call....pt ok and aware of new d.t

## 2015-05-21 ENCOUNTER — Other Ambulatory Visit: Payer: Self-pay | Admitting: Internal Medicine

## 2015-05-30 ENCOUNTER — Encounter: Payer: Self-pay | Admitting: Nurse Practitioner

## 2015-05-30 DIAGNOSIS — C50411 Malignant neoplasm of upper-outer quadrant of right female breast: Secondary | ICD-10-CM

## 2015-05-30 NOTE — Progress Notes (Signed)
The Survivorship Care Plan was mailed to Taylor Oconnor in lieu of an in-person visit at this time. A letter was mailed to her outlining the purpose of the content of the care plan, as well as encouraging her to reach out to me with any questions or concerns.  My business card was included in the correspondence to the patient as well.  A copy of the care plan was also routed/faxed/mailed to Alesia Richards, MD, the patient's PCP.  I will not be placing any follow-up appointments to the Survivorship Clinic for Taylor Oconnor, but I am happy to see her at any time in the future for any survivorship concerns that may arise. Thank you for allowing me to participate in her care!  Kenn File, [place] [phone]

## 2015-05-31 ENCOUNTER — Encounter: Payer: 59 | Admitting: Nurse Practitioner

## 2015-06-05 ENCOUNTER — Encounter: Payer: 59 | Admitting: Nurse Practitioner

## 2015-07-17 ENCOUNTER — Other Ambulatory Visit (HOSPITAL_BASED_OUTPATIENT_CLINIC_OR_DEPARTMENT_OTHER): Payer: 59

## 2015-07-17 DIAGNOSIS — C50411 Malignant neoplasm of upper-outer quadrant of right female breast: Secondary | ICD-10-CM

## 2015-07-17 LAB — CBC WITH DIFFERENTIAL/PLATELET
BASO%: 1 % (ref 0.0–2.0)
BASOS ABS: 0 10*3/uL (ref 0.0–0.1)
EOS%: 2.4 % (ref 0.0–7.0)
Eosinophils Absolute: 0.1 10*3/uL (ref 0.0–0.5)
HEMATOCRIT: 40.4 % (ref 34.8–46.6)
HGB: 13.5 g/dL (ref 11.6–15.9)
LYMPH#: 1.1 10*3/uL (ref 0.9–3.3)
LYMPH%: 25.1 % (ref 14.0–49.7)
MCH: 30.9 pg (ref 25.1–34.0)
MCHC: 33.4 g/dL (ref 31.5–36.0)
MCV: 92.4 fL (ref 79.5–101.0)
MONO#: 0.5 10*3/uL (ref 0.1–0.9)
MONO%: 10.5 % (ref 0.0–14.0)
NEUT#: 2.7 10*3/uL (ref 1.5–6.5)
NEUT%: 61 % (ref 38.4–76.8)
Platelets: 345 10*3/uL (ref 145–400)
RBC: 4.37 10*6/uL (ref 3.70–5.45)
RDW: 13.2 % (ref 11.2–14.5)
WBC: 4.4 10*3/uL (ref 3.9–10.3)

## 2015-07-17 LAB — COMPREHENSIVE METABOLIC PANEL
ALK PHOS: 41 U/L (ref 40–150)
ALT: 44 U/L (ref 0–55)
AST: 30 U/L (ref 5–34)
Albumin: 3.6 g/dL (ref 3.5–5.0)
Anion Gap: 9 mEq/L (ref 3–11)
BILIRUBIN TOTAL: 0.36 mg/dL (ref 0.20–1.20)
BUN: 15.1 mg/dL (ref 7.0–26.0)
CO2: 26 mEq/L (ref 22–29)
CREATININE: 0.9 mg/dL (ref 0.6–1.1)
Calcium: 9.4 mg/dL (ref 8.4–10.4)
Chloride: 107 mEq/L (ref 98–109)
EGFR: 70 mL/min/{1.73_m2} — AB (ref >90)
GLUCOSE: 93 mg/dL (ref 70–140)
Potassium: 4.1 mEq/L (ref 3.5–5.1)
Sodium: 142 mEq/L (ref 136–145)
Total Protein: 6.8 g/dL (ref 6.4–8.3)

## 2015-07-18 LAB — TESTOSTERONE, FREE, TOTAL, SHBG
SEX HORMONE BINDING: 48.1 nmol/L (ref 17.3–125.0)
TESTOSTERONE FREE: 2 pg/mL (ref 0.0–4.2)
TESTOSTERONE: 10 ng/dL (ref 3–41)

## 2015-07-23 ENCOUNTER — Ambulatory Visit: Payer: 59 | Admitting: Oncology

## 2015-07-28 NOTE — Progress Notes (Signed)
[place]  [phone] [phone]     ID: Taylor Oconnor DOB: [birth year]  MR#: [id number]  JYN#:[id number]  Patient Care Team: Taylor Pinto, MD as PCP - General (Internal Medicine) Taylor Overall, MD as Consulting Physician (General Surgery) Taylor Cruel, MD as Consulting Physician (Oncology) Taylor Silversmith, MD as Consulting Physician (Radiation Oncology) Taylor Cheese, NP as Nurse Practitioner (Hematology and Oncology) PCP: Taylor Richards, MD OTHER MD:  CHIEF COMPLAINT: Estrogen receptor positive breast cancer  CURRENT TREATMENT:  anastrozole   BREAST CANCER HISTORY: From the original intake note:  Taylor Oconnor's had routine bilateral digital screening mammography 11/16/2014 at the [facility] Center, showing a possible mass in the right breast. Right diagnostic mammography with tomosynthesis and right breast ultrasonography 11/27/2014 showed the breast density to be category B. There was a persistent spiculated mass in the upper outer right breast measuring 6 mm. Posterior to that there was a second mass measuring 1.6 cm. There was no palpable mass by exam. Ultrasonography confirmed a 0.6 cm irregular hypoechoic mass in the right breast at 10:00 7 cm from the nipple and another measuring 0.6 cm also in the 10:00 location but 9 cm from the nipple. The 2 masses were separated by approximately 2.9 cm. The axilla was sonographically benign.  On 11/28/2014 the patient underwent biopsy of the 2 masses in question the more posterior mass at 9 cm from the nipple was an invasive ductal carcinoma, grade 1, estrogen receptor 100% positive, progesterone receptor 85% positive, with an MIB-1 of 10% and no HER-2 amplification, the signals ratio being 1.41 and the number per cell 2.05.  The more anterior mass (measuring 0.6 cm) also showed an invasive ductal carcinoma, grade 1, estrogen receptor 100% positive, but progesterone receptor negative. The MIB-1  was 5%. This was also HER-2 negative, with a signals ratio 1.29 and the number per cell 2.00.  The patient's subsequent history is as detailed below  INTERVAL HISTORY: Taylor Oconnor returns today for follow-up of her early stage breast cancer. She has been on anastrozole for the past 2 months. She is generally tolerating it well, with no hot flashes. She does have some achiness however. This is particularly noticeable at nighttime, when she rolls over in bed. Her knees and ankles sometimes also hurt. She feels mildly fatigued.  REVIEW OF SYSTEMS:  She is not exercising regularly because she is too busy with work but she does take walks sometimes. A detailed review of systems today was otherwise noncontributory  PAST MEDICAL HISTORY: Past Medical History  Diagnosis Date  . Hypertension   . H/O hiatal hernia     resolved with band surgery  . 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 At risk for difficult insertion of breathing tube   . Insomnia     tx with zanax  . Anxiety   . GERD (gastroesophageal reflux disease)     hx - resolved with lap band surgery  . Hyperlipidemia   . Breast cancer of upper-outer quadrant of right female breast ([place]) 12/03/2014  . Breast cancer ([place])   . Allergy     SEASONAL  . Type II or unspecified type diabetes mellitus without mention of complication, not stated as uncontrolled 12/14/2012    no meds, diet controlled  . 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 Cholecystectomy    . Diagnostic laparoscopy    . Hernia repair  2008  . Tubal ligation    . Back surgery      cervical  . Laparoscopic appendectomy N/A 08/06/2012    Procedure: APPENDECTOMY LAPAROSCOPIC;  Surgeon: Earnstine Regal, MD;  Location: [facility];  Service: General;  Laterality: N/A;  . Appendectomy  08/06/12    lap appy  . Laparoscopic gastric banding with hiatal hernia repair N/A Nov 2008  . Colonoscopy   09/29/2004    normal  . Cesarean section      x 1  . Breast lumpectomy with needle localization and axillary sentinel lymph node bx Right 12/28/2014    Procedure: RIGHT BREAST LUMPECTOMY WITH TWO (2) NEEDLE LOCALIZATION AND AXILLARY SENTINEL LYMPH NODE BX;  Surgeon: Taylor Overall, MD;  Location: [facility];  Service: General;  Laterality: Right;    FAMILY HISTORY Family History  Problem Relation Age of Onset  . Cancer Cousin     Breast  . Cirrhosis Mother   . Alcohol abuse Mother   . Liver disease Mother   . Diabetes Father   . Hypertension Father   . Stroke Father   . Heart disease Father   . AAA (abdominal aortic aneurysm) Sister   . Colon cancer Neg Hx   . Rectal cancer Neg Hx   . Stomach cancer Neg Hx    the patient's father died from a stroke at age 31. The patient's mother died from cirrhosis of the liver at age 32. The patient had no brothers. She had 2 sisters, one of whom died from a ruptured aortic aneurysm. There is no history of breast or ovarian cancer in the family to the patient's knowledge  GYNECOLOGIC HISTORY:  No LMP recorded. Patient is postmenopausal. Menarche age 45, first live birth age 44. The patient is GX P1. She stopped having periods in 2002. She did not take hormone replacement. She used oral contraceptives for 11 years remotely, with no complications.  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 [name] is self-employed as a Herbalist area did daughter Warden/ranger lives in [place]. She is currently unemployed. She is divorced. The patient's grand daughter, 21 years old, lives in [place] with the child's father. The patient is not a church attender   ADVANCED DIRECTIVES: Not in place   HEALTH MAINTENANCE: Social History  Substance Use Topics  . Smoking status: Never Smoker   . Smokeless tobacco: Never Used  . Alcohol Use: No     Colonoscopy: DEC  2016  PAP:  Bone density:  Lipid panel:  Allergies  Allergen Reactions  . Codeine Itching  . Lipitor [Atorvastatin] Other (See Comments)    Severe fatigue. ? memory loss.  . Ace Inhibitors     cough    Current Outpatient Prescriptions  Medication Sig Dispense Refill  . acidophilus (RISAQUAD) CAPS Take 1 capsule by mouth daily. Reported on 02/19/2015    . ALPRAZolam (XANAX) 1 MG tablet Take 1/2 to 1  tablet 2 to 3 x /daily only if needed for severe anxiety 270 tablet 1  . anastrozole (ARIMIDEX) 1 MG tablet Take 1 tablet (1 mg total) by mouth daily. (Patient not taking: Reported on 05/09/2015) 90 tablet 4  . aspirin 81 MG tablet Take 81 mg by mouth daily.    Marland Kitchen atenolol (TENORMIN) 100 MG tablet TAKE 1 TABLET EVERY DAY 90 tablet 0  . b complex vitamins capsule Take 1 capsule by mouth daily.    Marland Kitchen  calcium carbonate (TUMS) 500 MG chewable tablet Chew 1,000 mg by mouth at bedtime.     . Cholecalciferol (VITAMIN D3) 5000 UNITS CAPS Take 5,000 Units by mouth daily.    . Cinnamon 500 MG TABS Take 1 tablet by mouth 2 (two) times daily. Cinnamon 1000 mg, 1 tab in morning and 1 tab evening    . fenofibrate micronized (LOFIBRA) 134 MG capsule TAKE ONE CAPSULE BY MOUTH EVERY DAY 30 capsule 5  . Magnesium 250 MG TABS Take 250 mg by mouth 2 (two) times daily.    . Multiple Vitamins-Minerals (MULTIVITAMIN WITH MINERALS) tablet Take 1 tablet by mouth 2 (two) times daily.    . Omega-3 Fatty Acids (FISH OIL) 1000 MG CAPS Take 1,000 mg by mouth daily.    Marland Kitchen OVER THE COUNTER MEDICATION Take 1 tablet by mouth 2 (two) times daily. Tumeric with black pepper    . PROCTOSOL HC 2.5 % rectal cream APPLY 1 APPLICATORFUL RECTALLY 4 TIMES DAILY AS DIRECTED 56.7 g 1  . verapamil (CALAN) 80 MG tablet TAKE 1 TABLET BY MOUTH TWICE A DAY FOR BLOOD PRESSURE 180 tablet 3   No current facility-administered medications for this visit.    OBJECTIVE: Middle-aged white woman Who appears stated age  12 Vitals:   07/29/15 1214   BP: 126/68  Pulse: 60  Temp: 97.6 F (36.4 C)  Resp: 16     Body mass index is 33.72 kg/(m^2).    ECOG FS:1 - Symptomatic but completely ambulatory  Sclerae unicteric, EOMs intact Oropharynx clear and moist No cervical or supraclavicular adenopathy Lungs no rales or rhonchi Heart regular rate and rhythm Abd soft, nontender, positive bowel sounds MSK no focal spinal tenderness, no upper extremity lymphedema Neuro: nonfocal, well oriented, appropriate affect Breasts: Deferred  LAB RESULTS:  CMP     Component Value Date/Time   NA 142 07/17/2015 0748   NA 139 01/24/2015 1657   K 4.1 07/17/2015 0748   K 4.6 01/24/2015 1657   CL 102 01/24/2015 1657   CO2 26 07/17/2015 0748   CO2 28 01/24/2015 1657   GLUCOSE 93 07/17/2015 0748   GLUCOSE 90 01/24/2015 1657   BUN 15.1 07/17/2015 0748   BUN 17 01/24/2015 1657   CREATININE 0.9 07/17/2015 0748   CREATININE 1.02* 01/24/2015 1657   CREATININE 0.83 08/06/2012 0815   CALCIUM 9.4 07/17/2015 0748   CALCIUM 9.4 01/24/2015 1657   PROT 6.8 07/17/2015 0748   PROT 6.5 01/24/2015 1657   ALBUMIN 3.6 07/17/2015 0748   ALBUMIN 4.1 01/24/2015 1657   AST 30 07/17/2015 0748   AST 28 01/24/2015 1657   ALT 44 07/17/2015 0748   ALT 32* 01/24/2015 1657   ALKPHOS 41 07/17/2015 0748   ALKPHOS 38 01/24/2015 1657   BILITOT 0.36 07/17/2015 0748   BILITOT 0.3 01/24/2015 1657   GFRNONAA 59* 01/24/2015 1657   GFRNONAA 75* 08/06/2012 0815   GFRAA 68 01/24/2015 1657   GFRAA 87* 08/06/2012 0815    INo results found for: SPEP, UPEP  Lab Results  Component Value Date   WBC 4.4 07/17/2015   NEUTROABS 2.7 07/17/2015   HGB 13.5 07/17/2015   HCT 40.4 07/17/2015   MCV 92.4 07/17/2015   PLT 345 07/17/2015      Chemistry      Component Value Date/Time   NA 142 07/17/2015 0748   NA 139 01/24/2015 1657   K 4.1 07/17/2015 0748   K 4.6 01/24/2015 1657   CL 102 01/24/2015 1657  CO2 26 07/17/2015 0748   CO2 28 01/24/2015 1657   BUN 15.1  07/17/2015 0748   BUN 17 01/24/2015 1657   CREATININE 0.9 07/17/2015 0748   CREATININE 1.02* 01/24/2015 1657   CREATININE 0.83 08/06/2012 0815      Component Value Date/Time   CALCIUM 9.4 07/17/2015 0748   CALCIUM 9.4 01/24/2015 1657   ALKPHOS 41 07/17/2015 0748   ALKPHOS 38 01/24/2015 1657   AST 30 07/17/2015 0748   AST 28 01/24/2015 1657   ALT 44 07/17/2015 0748   ALT 32* 01/24/2015 1657   BILITOT 0.36 07/17/2015 0748   BILITOT 0.3 01/24/2015 1657       No results found for: LABCA2  No components found for: LABCA125  No results for input(s): INR in the last 168 hours.  Urinalysis    Component Value Date/Time   COLORURINE YELLOW 01/24/2015 1657   APPEARANCEUR CLEAR 01/24/2015 1657   LABSPEC 1.019 01/24/2015 1657   PHURINE 7.5 01/24/2015 1657   GLUCOSEU NEGATIVE 01/24/2015 1657   HGBUR NEGATIVE 01/24/2015 1657   BILIRUBINUR NEGATIVE 01/24/2015 1657   KETONESUR NEGATIVE 01/24/2015 1657   PROTEINUR NEGATIVE 01/24/2015 1657   UROBILINOGEN 0.2 05/30/2013 0903   NITRITE NEGATIVE 01/24/2015 1657   LEUKOCYTESUR NEGATIVE 01/24/2015 1657    STUDIES: No results found.  ASSESSMENT: 65 y.o. [place] woman status post Right breast upper outer quadrant biopsy 11/28/2014 of 2 separate right breast masses, both invasive ductal carcinomas, grade 1, estrogen receptor positive, and HER-2 not amplified. MIB-1 is 5-10%. The smaller mass is progesterone receptor negative  (1) Status post right lumpectomy and sentinel lymph node sampling 12/28/2014 for an mpT1c pN0, stage IA invasive ductal carcinoma, grade 2, repeat HER-2 again negative  (2) Oncotype score of 16 predict sent outside the breast risk of recurrence within 10 years of 10% if the patient's only systemic therapy is tamoxifen for 5 years. It also predicts no benefit from adjuvant chemotherapy.  (3) adjuvant radiation 02/18/2015-04/03/2015:  1. Right breast / 45 Gray at 1.8 Gray per fraction x 25 fractions 2. Right breast  boost / 16 Gray at [employer] per fraction x 8 fractions  (4) Anastrozole started 05/13/2015  (a) bone density 01/18/2015 was normal with a T score of 0.4  (5) polycystic kidney disease  (a) testosterone level 10 ng/dL [normal 6-86 in women] 07/17/2015  (6) consider B-Well study  PLAN   Taylor Oconnor is tolerating the anastrozole moderately well. She does have some aches and pains, which may be related to the medication. It isn't bad enough that she has had to take any pain medicine for this.  I think what we would like to do at this point is pushed through it and see if things improve over the next 3 months. To help with that I gave her information on the Trail to recovery and [employer]. Ideally if she were doing some form of tai chi or perhaps even yoga I think she some of these pains would take care of themselves.  At her request I reviewed the data on polycystic disease and anastrozole and I could not find any articles, but I did check your testosterone level and it is in the low side of normal.  She will see me again in October. If things are no better or worse then we will consider switching to tamoxifen  She knows to call for any problems that may develop before her next visit here.  Taylor Cruel, MD   07/29/2015 6:17 PM Medical  Oncology and Hematology [facility] [address] [place], [place] [address] Tel. [phone]    Fax. [phone]

## 2015-07-29 ENCOUNTER — Telehealth: Payer: Self-pay | Admitting: Oncology

## 2015-07-29 ENCOUNTER — Ambulatory Visit (HOSPITAL_BASED_OUTPATIENT_CLINIC_OR_DEPARTMENT_OTHER): Payer: 59 | Admitting: Oncology

## 2015-07-29 VITALS — BP 126/68 | HR 60 | Temp 97.6°F | Resp 16 | Ht 63.5 in | Wt 193.4 lb

## 2015-07-29 DIAGNOSIS — Q612 Polycystic kidney, adult type: Secondary | ICD-10-CM

## 2015-07-29 DIAGNOSIS — C50411 Malignant neoplasm of upper-outer quadrant of right female breast: Secondary | ICD-10-CM | POA: Diagnosis not present

## 2015-07-29 NOTE — Telephone Encounter (Signed)
appt made and avs printed °

## 2015-09-19 ENCOUNTER — Other Ambulatory Visit: Payer: Self-pay | Admitting: Physician Assistant

## 2015-11-04 ENCOUNTER — Other Ambulatory Visit (HOSPITAL_BASED_OUTPATIENT_CLINIC_OR_DEPARTMENT_OTHER): Payer: 59

## 2015-11-04 ENCOUNTER — Ambulatory Visit (HOSPITAL_BASED_OUTPATIENT_CLINIC_OR_DEPARTMENT_OTHER): Payer: 59 | Admitting: Oncology

## 2015-11-04 ENCOUNTER — Telehealth: Payer: Self-pay | Admitting: Oncology

## 2015-11-04 VITALS — BP 143/68 | HR 65 | Temp 97.7°F | Resp 18 | Ht 63.5 in | Wt 204.7 lb

## 2015-11-04 DIAGNOSIS — Z170 Estrogen receptor positive status [ER+]: Secondary | ICD-10-CM | POA: Diagnosis not present

## 2015-11-04 DIAGNOSIS — C50411 Malignant neoplasm of upper-outer quadrant of right female breast: Secondary | ICD-10-CM | POA: Diagnosis not present

## 2015-11-04 LAB — CBC WITH DIFFERENTIAL/PLATELET
BASO%: 0.7 % (ref 0.0–2.0)
BASOS ABS: 0 10*3/uL (ref 0.0–0.1)
EOS ABS: 0.2 10*3/uL (ref 0.0–0.5)
EOS%: 3 % (ref 0.0–7.0)
HCT: 38.7 % (ref 34.8–46.6)
HGB: 12.8 g/dL (ref 11.6–15.9)
LYMPH%: 33.2 % (ref 14.0–49.7)
MCH: 30.5 pg (ref 25.1–34.0)
MCHC: 33 g/dL (ref 31.5–36.0)
MCV: 92.2 fL (ref 79.5–101.0)
MONO#: 0.6 10*3/uL (ref 0.1–0.9)
MONO%: 9.8 % (ref 0.0–14.0)
NEUT#: 3 10*3/uL (ref 1.5–6.5)
NEUT%: 53.3 % (ref 38.4–76.8)
PLATELETS: 371 10*3/uL (ref 145–400)
RBC: 4.19 10*6/uL (ref 3.70–5.45)
RDW: 13 % (ref 11.2–14.5)
WBC: 5.6 10*3/uL (ref 3.9–10.3)
lymph#: 1.9 10*3/uL (ref 0.9–3.3)

## 2015-11-04 LAB — COMPREHENSIVE METABOLIC PANEL
ALT: 25 U/L (ref 0–55)
ANION GAP: 8 meq/L (ref 3–11)
AST: 21 U/L (ref 5–34)
Albumin: 3.4 g/dL — ABNORMAL LOW (ref 3.5–5.0)
Alkaline Phosphatase: 48 U/L (ref 40–150)
BUN: 14.7 mg/dL (ref 7.0–26.0)
CO2: 28 meq/L (ref 22–29)
Calcium: 9.5 mg/dL (ref 8.4–10.4)
Chloride: 106 mEq/L (ref 98–109)
Creatinine: 1.1 mg/dL (ref 0.6–1.1)
EGFR: 52 mL/min/{1.73_m2} — AB (ref >90)
GLUCOSE: 110 mg/dL (ref 70–140)
POTASSIUM: 3.9 meq/L (ref 3.5–5.1)
SODIUM: 142 meq/L (ref 136–145)
TOTAL PROTEIN: 6.5 g/dL (ref 6.4–8.3)

## 2015-11-04 NOTE — Telephone Encounter (Signed)
GAVE PATIENT AVS REPORT AND APPOINTMENTS FOR MAY °

## 2015-11-04 NOTE — Progress Notes (Signed)
[place]  [phone] [phone]     ID: Taylor Oconnor DOB: [birth year]  MR#: [id number]  LKG#:[id number]  Patient Care Team: Unk Pinto, MD as PCP - General (Internal Medicine) Alphonsa Overall, MD as Consulting Physician (General Surgery) Chauncey Cruel, MD as Consulting Physician (Oncology) Thea Silversmith, MD as Consulting Physician (Radiation Oncology) Sylvan Cheese, NP as Nurse Practitioner (Hematology and Oncology) PCP: Alesia Richards, MD OTHER MD:  CHIEF COMPLAINT: Estrogen receptor positive breast cancer  CURRENT TREATMENT:  anastrozole   BREAST CANCER HISTORY: From the original intake note:  Taylor Oconnor had routine bilateral digital screening mammography 11/16/2014 at the [facility] Center, showing a possible mass in the right breast. Right diagnostic mammography with tomosynthesis and right breast ultrasonography 11/27/2014 showed the breast density to be category B. There was a persistent spiculated mass in the upper outer right breast measuring 6 mm. Posterior to that there was a second mass measuring 1.6 cm. There was no palpable mass by exam. Ultrasonography confirmed a 0.6 cm irregular hypoechoic mass in the right breast at 10:00 7 cm from the nipple and another measuring 0.6 cm also in the 10:00 location but 9 cm from the nipple. The 2 masses were separated by approximately 2.9 cm. The axilla was sonographically benign.  On 11/28/2014 the patient underwent biopsy of the 2 masses in question the more posterior mass at 9 cm from the nipple was an invasive ductal carcinoma, grade 1, estrogen receptor 100% positive, progesterone receptor 85% positive, with an MIB-1 of 10% and no HER-2 amplification, the signals ratio being 1.41 and the number per cell 2.05.  The more anterior mass (measuring 0.6 cm) also showed an invasive ductal carcinoma, grade 1, estrogen receptor 100% positive, but progesterone receptor negative. The MIB-1  was 5%. This was also HER-2 negative, with a signals ratio 1.29 and the number per cell 2.00.  The patient's subsequent history is as detailed below  INTERVAL HISTORY: Taylor Oconnor returns today for follow-up of her estrogen receptor positive breast cancer. She continues on anastrozole, and now she feels she is tolerating it much better. She tells me hot flashes are not a problem. She does have some vaginal dryness issues but not something she would want referral to physical therapy for. She is obtaining the drug at a reasonable [facility].  REVIEW OF SYSTEMS:  Taylor Oconnor is stressed at work. She tells me she is also a "stress eater" and is always hungry. She tells me this is the reason for her weight gain. In addition she has a heel spur which makes it hard for her to walk for exercise. Sometimes her ankles swell. She feels some anxiety, but not depression. A detailed review of systems today was otherwise stable  PAST MEDICAL HISTORY: Past Medical History:  Diagnosis Date  . Allergy    SEASONAL  . Anxiety   . At risk for difficult insertion of breathing tube   . Breast cancer ([place])   . Breast cancer of upper-outer quadrant of right female breast ([place]) 12/03/2014  . 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 GERD (gastroesophageal reflux disease)    hx - resolved with lap band surgery  . H/O hiatal hernia    resolved with band surgery  . Hyperlipidemia   . Hypertension   . Insomnia    tx with zanax  . 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 Type II or unspecified type diabetes mellitus without mention of complication, not stated as uncontrolled  12/14/2012   no meds, diet controlled    PAST SURGICAL HISTORY: Past Surgical History:  Procedure Laterality Date  . APPENDECTOMY  08/06/12   lap appy  . BACK SURGERY     cervical  . BREAST LUMPECTOMY WITH NEEDLE LOCALIZATION AND AXILLARY SENTINEL LYMPH NODE BX Right 12/28/2014   Procedure: RIGHT BREAST LUMPECTOMY WITH TWO (2) NEEDLE  LOCALIZATION AND AXILLARY SENTINEL LYMPH NODE BX;  Surgeon: Alphonsa Overall, MD;  Location: [facility];  Service: General;  Laterality: Right;  . CESAREAN SECTION     x 1  . CHOLECYSTECTOMY    . COLONOSCOPY  09/29/2004   normal  . DIAGNOSTIC LAPAROSCOPY    . HERNIA REPAIR  2008  . LAPAROSCOPIC APPENDECTOMY N/A 08/06/2012   Procedure: APPENDECTOMY LAPAROSCOPIC;  Surgeon: Earnstine Regal, MD;  Location: [facility];  Service: General;  Laterality: N/A;  . LAPAROSCOPIC GASTRIC BANDING WITH HIATAL HERNIA REPAIR N/A Nov 2008  . TUBAL LIGATION      FAMILY HISTORY Family History  Problem Relation Age of Onset  . Cancer Cousin     Breast  . Cirrhosis Mother   . Alcohol abuse Mother   . Liver disease Mother   . Diabetes Father   . Hypertension Father   . Stroke Father   . Heart disease Father   . AAA (abdominal aortic aneurysm) Sister   . Colon cancer Neg Hx   . Rectal cancer Neg Hx   . Stomach cancer Neg Hx    the patient's father died from a stroke at age 32. The patient's mother died from cirrhosis of the liver at age 10. The patient had no brothers. She had 2 sisters, one of whom died from a ruptured aortic aneurysm. There is no history of breast or ovarian cancer in the family to the patient's knowledge  GYNECOLOGIC HISTORY:  No LMP recorded. Patient is postmenopausal. Menarche age 56, first live birth age 19. The patient is GX P1. She stopped having periods in 2002. She did not take hormone replacement. She used oral contraceptives for 11 years remotely, with no complications.  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 [name] is self-employed as a Herbalist area did daughter Warden/ranger lives in [place]. She is currently unemployed. She is divorced. The patient's grand daughter, 57 years old, lives in [place] with the child's father. The patient is not a church attender   ADVANCED DIRECTIVES:  Not in place   HEALTH MAINTENANCE: Social History  Substance Use Topics  . Smoking status: Never Smoker  . Smokeless tobacco: Never Used  . Alcohol use No     Colonoscopy: DEC 2016  PAP:  Bone density:  Lipid panel:  Allergies  Allergen Reactions  . Codeine Itching  . Lipitor [Atorvastatin] Other (See Comments)    Severe fatigue. ? memory loss.  . Ace Inhibitors     cough    Current Outpatient Prescriptions  Medication Sig Dispense Refill  . acidophilus (RISAQUAD) CAPS Take 1 capsule by mouth daily. Reported on 02/19/2015    . anastrozole (ARIMIDEX) 1 MG tablet Take 1 tablet (1 mg total) by mouth daily. (Patient not taking: Reported on 05/09/2015) 90 tablet 4  . aspirin 81 MG tablet Take 81 mg by mouth daily.    Marland Kitchen atenolol (TENORMIN) 100 MG tablet TAKE 1 TABLET EVERY DAY 90 tablet 0  . b complex vitamins capsule Take 1 capsule by mouth daily.    Marland Kitchen  calcium carbonate (TUMS) 500 MG chewable tablet Chew 1,000 mg by mouth at bedtime.     . Cholecalciferol (VITAMIN D3) 5000 UNITS CAPS Take 5,000 Units by mouth daily.    . Cinnamon 500 MG TABS Take 1 tablet by mouth 2 (two) times daily. Cinnamon 1000 mg, 1 tab in morning and 1 tab evening    . fenofibrate micronized (LOFIBRA) 134 MG capsule TAKE ONE CAPSULE BY MOUTH EVERY DAY 30 capsule 5  . Magnesium 250 MG TABS Take 250 mg by mouth 2 (two) times daily.    . Multiple Vitamins-Minerals (MULTIVITAMIN WITH MINERALS) tablet Take 1 tablet by mouth 2 (two) times daily.    . Omega-3 Fatty Acids (FISH OIL) 1000 MG CAPS Take 1,000 mg by mouth daily.    Marland Kitchen OVER THE COUNTER MEDICATION Take 1 tablet by mouth 2 (two) times daily. Tumeric with black pepper    . PROCTOSOL HC 2.5 % rectal cream APPLY 1 APPLICATORFUL RECTALLY 4 TIMES DAILY AS DIRECTED 56.7 g 1  . verapamil (CALAN) 80 MG tablet TAKE 1 TABLET BY MOUTH TWICE A DAY FOR BLOOD PRESSURE 180 tablet 1   No current facility-administered medications for this visit.     OBJECTIVE: Middle-aged  white woman In no acute distress Vitals:   11/04/15 1551  BP: (!) 143/68  Pulse: 65  Resp: 18  Temp: 97.7 F (36.5 C)     Body mass index is 35.69 kg/m.    ECOG FS:1 - Symptomatic but completely ambulatory  Sclerae unicteric, pupils round and equal Oropharynx clear and moist-- no thrush or other lesions No cervical or supraclavicular adenopathy Lungs no rales or rhonchi Heart regular rate and rhythm Abd soft, nontender, positive bowel sounds MSK no focal spinal tenderness, no upper extremity lymphedema Neuro: nonfocal, well oriented, appropriate affect Breasts: The right breast is status post lumpectomy and radiation. There is no evidence of local recurrence. There is normal sensitivity to exam. The right axilla is benign. The left breast is unremarkable   LAB RESULTS:  CMP     Component Value Date/Time   NA 142 11/04/2015 1538   K 3.9 11/04/2015 1538   CL 102 01/24/2015 1657   CO2 28 11/04/2015 1538   GLUCOSE 110 11/04/2015 1538   BUN 14.7 11/04/2015 1538   CREATININE 1.1 11/04/2015 1538   CALCIUM 9.5 11/04/2015 1538   PROT 6.5 11/04/2015 1538   ALBUMIN 3.4 (L) 11/04/2015 1538   AST 21 11/04/2015 1538   ALT 25 11/04/2015 1538   ALKPHOS 48 11/04/2015 1538   BILITOT <0.22 11/04/2015 1538   GFRNONAA 59 (L) 01/24/2015 1657   GFRAA 68 01/24/2015 1657    INo results found for: SPEP, UPEP  Lab Results  Component Value Date   WBC 5.6 11/04/2015   NEUTROABS 3.0 11/04/2015   HGB 12.8 11/04/2015   HCT 38.7 11/04/2015   MCV 92.2 11/04/2015   PLT 371 11/04/2015      Chemistry      Component Value Date/Time   NA 142 11/04/2015 1538   K 3.9 11/04/2015 1538   CL 102 01/24/2015 1657   CO2 28 11/04/2015 1538   BUN 14.7 11/04/2015 1538   CREATININE 1.1 11/04/2015 1538      Component Value Date/Time   CALCIUM 9.5 11/04/2015 1538   ALKPHOS 48 11/04/2015 1538   AST 21 11/04/2015 1538   ALT 25 11/04/2015 1538   BILITOT <0.22 11/04/2015 1538       No results  found for: LABCA2  No components found for: LABCA125  No results for input(s): INR in the last 168 hours.  Urinalysis    Component Value Date/Time   COLORURINE YELLOW 01/24/2015 1657   APPEARANCEUR CLEAR 01/24/2015 1657   LABSPEC 1.019 01/24/2015 1657   PHURINE 7.5 01/24/2015 1657   GLUCOSEU NEGATIVE 01/24/2015 1657   HGBUR NEGATIVE 01/24/2015 1657   BILIRUBINUR NEGATIVE 01/24/2015 1657   KETONESUR NEGATIVE 01/24/2015 1657   PROTEINUR NEGATIVE 01/24/2015 1657   UROBILINOGEN 0.2 05/30/2013 0903   NITRITE NEGATIVE 01/24/2015 1657   LEUKOCYTESUR NEGATIVE 01/24/2015 1657    STUDIES: No results found.  ASSESSMENT: 65 y.o. [place] woman status post Right breast upper outer quadrant biopsy 11/28/2014 of 2 separate right breast masses, both invasive ductal carcinomas, grade 1, estrogen receptor positive, and HER-2 not amplified. MIB-1 is 5-10%. The smaller mass is progesterone receptor negative  (1) Status post right lumpectomy and sentinel lymph node sampling 12/28/2014 for an mpT1c pN0, stage IA invasive ductal carcinoma, grade 2, repeat HER-2 again negative  (2) Oncotype score of 16 predict sent outside the breast risk of recurrence within 10 years of 10% if the patient's only systemic therapy is tamoxifen for 5 years. It also predicts no benefit from adjuvant chemotherapy.  (3) adjuvant radiation 02/18/2015-04/03/2015:  1. Right breast / 45 Gray at 1.8 Gray per fraction x 25 fractions 2. Right breast boost / 16 Gray at [employer] per fraction x 8 fractions  (4) Anastrozole started 05/13/2015  (a) bone density 01/18/2015 was normal with a T score of 0.4  (5) polycystic kidney disease  (a) testosterone level 10 ng/dL [normal 6-86 in women] 07/17/2015  (6) consider B-Well study  PLAN   Taylor Oconnor is currently nearly a year out from definitive surgery for her breast cancer with no evidence of disease recurrence. This is favorable.  She is settling into the anastrozole without great  difficulties. The plan will be to continue this for total of 5 years. We will repeat a bone density in January 2019  We discussed weight issues and exercise. I gave her information on our nutrition program and a tracheostomy recovery.  She knows to call for any problems that may develop before her next visit here, which will be in 6 months. Chauncey Cruel, MD   11/04/2015 6:03 PM Medical Oncology and Hematology [facility] [address] [place], [place] [address] Tel. [phone]    Fax. [phone]

## 2015-11-13 ENCOUNTER — Other Ambulatory Visit: Payer: Self-pay | Admitting: Oncology

## 2015-11-13 DIAGNOSIS — Z853 Personal history of malignant neoplasm of breast: Secondary | ICD-10-CM

## 2015-11-15 ENCOUNTER — Other Ambulatory Visit: Payer: Self-pay | Admitting: Internal Medicine

## 2015-11-18 ENCOUNTER — Ambulatory Visit
Admission: RE | Admit: 2015-11-18 | Discharge: 2015-11-18 | Disposition: A | Discharge status: Home / Self Care | Payer: 59 | Source: Ambulatory Visit | Attending: Oncology | Admitting: Oncology

## 2015-11-18 DIAGNOSIS — Z853 Personal history of malignant neoplasm of breast: Secondary | ICD-10-CM

## 2015-12-05 ENCOUNTER — Other Ambulatory Visit: Payer: Self-pay | Admitting: Internal Medicine

## 2015-12-06 NOTE — Telephone Encounter (Signed)
Please call alprazolam 

## 2015-12-12 ENCOUNTER — Other Ambulatory Visit: Payer: Self-pay | Admitting: Internal Medicine

## 2016-02-17 ENCOUNTER — Other Ambulatory Visit: Payer: Self-pay | Admitting: Internal Medicine

## 2016-02-20 ENCOUNTER — Ambulatory Visit (INDEPENDENT_AMBULATORY_CARE_PROVIDER_SITE_OTHER): Payer: 59 | Admitting: Internal Medicine

## 2016-02-20 VITALS — BP 130/76 | HR 72 | Temp 97.3°F | Resp 16 | Ht 63.5 in | Wt 208.0 lb

## 2016-02-20 DIAGNOSIS — Z0000 Encounter for general adult medical examination without abnormal findings: Secondary | ICD-10-CM | POA: Diagnosis not present

## 2016-02-20 DIAGNOSIS — E1122 Type 2 diabetes mellitus with diabetic chronic kidney disease: Secondary | ICD-10-CM

## 2016-02-20 DIAGNOSIS — R5383 Other fatigue: Secondary | ICD-10-CM

## 2016-02-20 DIAGNOSIS — E782 Mixed hyperlipidemia: Secondary | ICD-10-CM

## 2016-02-20 DIAGNOSIS — Z1212 Encounter for screening for malignant neoplasm of rectum: Secondary | ICD-10-CM

## 2016-02-20 DIAGNOSIS — M779 Enthesopathy, unspecified: Secondary | ICD-10-CM

## 2016-02-20 DIAGNOSIS — E559 Vitamin D deficiency, unspecified: Secondary | ICD-10-CM

## 2016-02-20 DIAGNOSIS — Z136 Encounter for screening for cardiovascular disorders: Secondary | ICD-10-CM

## 2016-02-20 DIAGNOSIS — N182 Chronic kidney disease, stage 2 (mild): Secondary | ICD-10-CM

## 2016-02-20 DIAGNOSIS — I10 Essential (primary) hypertension: Secondary | ICD-10-CM

## 2016-02-20 DIAGNOSIS — Z111 Encounter for screening for respiratory tuberculosis: Secondary | ICD-10-CM

## 2016-02-20 DIAGNOSIS — Z0001 Encounter for general adult medical examination with abnormal findings: Secondary | ICD-10-CM

## 2016-02-20 DIAGNOSIS — Z79899 Other long term (current) drug therapy: Secondary | ICD-10-CM

## 2016-02-20 LAB — LIPID PANEL
Cholesterol: 200 mg/dL — ABNORMAL HIGH (ref <200)
HDL: 81 mg/dL (ref >50)
LDL CALC: 95 mg/dL (ref <100)
Total CHOL/HDL Ratio: 2.5 Ratio (ref <5.0)
Triglycerides: 119 mg/dL (ref <150)
VLDL: 24 mg/dL (ref <30)

## 2016-02-20 LAB — URINALYSIS, ROUTINE W REFLEX MICROSCOPIC
BILIRUBIN URINE: NEGATIVE
GLUCOSE, UA: NEGATIVE
HGB URINE DIPSTICK: NEGATIVE
Ketones, ur: NEGATIVE
Nitrite: NEGATIVE
PROTEIN: NEGATIVE
Specific Gravity, Urine: 1.019 (ref 1.001–1.035)
pH: 6 (ref 5.0–8.0)

## 2016-02-20 LAB — CBC WITH DIFFERENTIAL/PLATELET
BASOS PCT: 1 %
Basophils Absolute: 60 cells/uL (ref 0–200)
EOS ABS: 120 {cells}/uL (ref 15–500)
Eosinophils Relative: 2 %
HCT: 41 % (ref 35.0–45.0)
Hemoglobin: 13.7 g/dL (ref 11.7–15.5)
LYMPHS ABS: 2220 {cells}/uL (ref 850–3900)
LYMPHS PCT: 37 %
MCH: 30.8 pg (ref 27.0–33.0)
MCHC: 33.4 g/dL (ref 32.0–36.0)
MCV: 92.1 fL (ref 80.0–100.0)
MONO ABS: 480 {cells}/uL (ref 200–950)
MPV: 8.9 fL (ref 7.5–12.5)
Monocytes Relative: 8 %
NEUTROS ABS: 3120 {cells}/uL (ref 1500–7800)
Neutrophils Relative %: 52 %
PLATELETS: 403 10*3/uL — AB (ref 140–400)
RBC: 4.45 MIL/uL (ref 3.80–5.10)
RDW: 13.6 % (ref 11.0–15.0)
WBC: 6 10*3/uL (ref 3.8–10.8)

## 2016-02-20 LAB — BASIC METABOLIC PANEL WITH GFR
BUN: 19 mg/dL (ref 7–25)
CHLORIDE: 103 mmol/L (ref 98–110)
CO2: 27 mmol/L (ref 20–31)
Calcium: 9.6 mg/dL (ref 8.6–10.4)
Creat: 0.98 mg/dL (ref 0.50–0.99)
GFR, EST AFRICAN AMERICAN: 71 mL/min (ref >=60)
GFR, EST NON AFRICAN AMERICAN: 61 mL/min (ref >=60)
GLUCOSE: 97 mg/dL (ref 65–99)
POTASSIUM: 4.3 mmol/L (ref 3.5–5.3)
Sodium: 139 mmol/L (ref 135–146)

## 2016-02-20 LAB — TSH: TSH: 2.38 m[IU]/L

## 2016-02-20 LAB — HEPATIC FUNCTION PANEL
ALK PHOS: 44 U/L (ref 33–130)
ALT: 32 U/L — ABNORMAL HIGH (ref 6–29)
AST: 27 U/L (ref 10–35)
Albumin: 4.3 g/dL (ref 3.6–5.1)
BILIRUBIN INDIRECT: 0.3 mg/dL (ref 0.2–1.2)
BILIRUBIN TOTAL: 0.4 mg/dL (ref 0.2–1.2)
Bilirubin, Direct: 0.1 mg/dL (ref <=0.2)
Total Protein: 6.8 g/dL (ref 6.1–8.1)

## 2016-02-20 LAB — IRON AND TIBC
%SAT: 15 % (ref 11–50)
Iron: 65 ug/dL (ref 45–160)
TIBC: 422 ug/dL (ref 250–450)
UIBC: 357 ug/dL (ref 125–400)

## 2016-02-20 LAB — MAGNESIUM: Magnesium: 2 mg/dL (ref 1.5–2.5)

## 2016-02-20 LAB — VITAMIN B12: Vitamin B-12: 837 pg/mL (ref 200–1100)

## 2016-02-20 MED ORDER — DICLOFENAC SODIUM 2 % TD SOLN: TRANSDERMAL | 11 refills | Status: DC

## 2016-02-20 NOTE — Progress Notes (Signed)
[facility] Unk Pinto, M.D.    Uvaldo Bristle. Silverio Lay, P.A.-C      Starlyn Skeans, P.A.-C  [facility]                [address] [place] [id number] Telephone [phone] Telefax [phone]  Annual Screening/Preventative Visit & Comprehensive Evaluation &  Examination     This very nice 66 y.o. MWF presents for a Screening/Preventative Visit & comprehensive evaluation and management of multiple medical co-morbidities.  Patient has been followed for HTN, T2_NIDDM, Hyperlipidemia and Vitamin D Deficiency. Today she also requested refil for a diclofenac soln rx'd by her Ortho Dr for Tendonitis.      In Dec 2016 , she was dx'd with a ER (+) R Breast Cancer and underwent R lumpectomy by Dr Alphonsa Overall followed by Radiation (Dr Pablo Ledger) and has been treated with primary hormonal suppression with Anastrozole prescribed by Dr [name].       HTN predates circa 1995. Patient's BP has been controlled at home and patient denies any cardiac symptoms as chest pain, palpitations, shortness of breath, dizziness or ankle swelling. Today's BP is at goal - 130/76      Patient's hyperlipidemia is controlled with diet and medications. Patient denies myalgias or other medication SE's. Last lipids were near goal: Lab Results  Component Value Date   CHOL 202 (H) 01/24/2015   HDL 77 01/24/2015   LDLCALC 105 01/24/2015   TRIG 100 01/24/2015   CHOLHDL 2.6 01/24/2015      Patient has Morbid obesity (BMI 36+) with weight increased from 185# up 23# to 208# and she has a hx/o PreDm since 2008 and then T2DM in 2012.  In Nov 2008 , she underwent Lap Band bariatric surger with initial success in weight loss, but unfortunately regained her losses plus more. She is attempting dietary control. She denies reactive hypoglycemic symptoms, visual blurring, diabetic polys, or paresthesias. Last A1c was not at goal: Lab Results   Component Value Date   HGBA1C 5.9 (H) 01/24/2015      Finally, patient has history of Vitamin D Deficiency ("28" in 2008) and last Vitamin D was at goal: Lab Results  Component Value Date   VD25OH 58 01/24/2015   Current Outpatient Prescriptions on File Prior to Visit  Medication Sig  . acidophilus (RISAQUAD) CAPS Take 1 capsule by mouth daily. Reported on 02/19/2015  . ALPRAZolam (XANAX) 1 MG tablet TAKE 1/2 TO 1 TABLET BY MOUTH 2 TO 3 TIMES A DAY ONLY AS NEEDED FOR SEVERE ANXIETY  . anastrozole (ARIMIDEX) 1 MG tablet Take 1 tablet (1 mg total) by mouth daily.  Marland Kitchen aspirin 81 MG tablet Take 81 mg by mouth daily.  Marland Kitchen atenolol (TENORMIN) 100 MG tablet TAKE 1 TABLET EVERY DAY  . b complex vitamins capsule Take 1 capsule by mouth daily.  . calcium carbonate (TUMS) 500 MG chewable tablet Chew 1,000 mg by mouth at bedtime.   . Cholecalciferol (VITAMIN D3) 5000 UNITS CAPS Take 5,000 Units by mouth daily.  . Cinnamon 500 MG TABS Take 1 tablet by mouth 2 (two) times daily. Cinnamon 1000 mg, 1 tab in morning and 1 tab evening  . fenofibrate micronized (LOFIBRA) 134 MG capsule TAKE ONE CAPSULE BY MOUTH EVERY DAY  . Magnesium 250 MG TABS Take 250 mg by mouth 2 (two) times daily.  . Multiple Vitamins-Minerals (  MULTIVITAMIN WITH MINERALS) tablet Take 1 tablet by mouth 2 (two) times daily.  . Omega-3 Fatty Acids (FISH OIL) 1000 MG CAPS Take 1,000 mg by mouth daily.  Marland Kitchen OVER THE COUNTER MEDICATION Take 1 tablet by mouth 2 (two) times daily. Tumeric with black pepper  . PROCTOSOL HC 2.5 % rectal cream APPLY 1 APPLICATORFUL RECTALLY 4 TIMES DAILY AS DIRECTED  . verapamil (CALAN) 80 MG tablet TAKE 1 TABLET BY MOUTH TWICE A DAY FOR BLOOD PRESSURE   No current facility-administered medications on file prior to visit.    Allergies  Allergen Reactions  . Codeine Itching  . Lipitor [Atorvastatin] Other (See Comments)    Severe fatigue. ? memory loss.  . Ace Inhibitors     cough   Past Medical History:   Diagnosis Date  . Allergy    SEASONAL  . Anxiety   . At risk for difficult insertion of breathing tube   . Breast cancer ([place])   . Breast cancer of upper-outer quadrant of right female breast ([place]) 12/03/2014  . 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 GERD (gastroesophageal reflux disease)    hx - resolved with lap band surgery  . H/O hiatal hernia    resolved with band surgery  . Hyperlipidemia   . Hypertension   . Insomnia    tx with zanax  . 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 Type II or unspecified type diabetes mellitus without mention of complication, not stated as uncontrolled 12/14/2012   no meds, diet controlled   Health Maintenance  Topic Date Due  . ZOSTAVAX  10/05/2011  . OPHTHALMOLOGY EXAM  02/22/2014  . HEMOGLOBIN A1C  07/24/2015  . PAP SMEAR  12/15/2015  . FOOT EXAM  01/24/2016  . URINE MICROALBUMIN  01/24/2016  . MAMMOGRAM  11/17/2017  . TETANUS/TDAP  01/04/2024  . COLONOSCOPY  12/16/2024  . INFLUENZA VACCINE  Completed  . Hepatitis C Screening  Completed  . HIV Screening  Completed   Immunization History  Administered Date(s) Administered  . DT 12/18/2013  . DTaP 01/12/2002  . Influenza Split 12/14/2012, 11/19/2013, 10/27/2014  . Influenza-Unspecified 10/22/2015  . PPD Test 12/14/2012, 12/18/2013, 01/24/2015  . Pneumococcal Conjugate-13 10/09/2013  . Pneumococcal Polysaccharide-23 01/13/1992   Past Surgical History:  Procedure Laterality Date  . APPENDECTOMY  08/06/12   lap appy  . BACK SURGERY     cervical  . BREAST LUMPECTOMY WITH NEEDLE LOCALIZATION AND AXILLARY SENTINEL LYMPH NODE BX Right 12/28/2014   Procedure: RIGHT BREAST LUMPECTOMY WITH TWO (2) NEEDLE LOCALIZATION AND AXILLARY SENTINEL LYMPH NODE BX;  Surgeon: Alphonsa Overall, MD;  Location: [facility];  Service: General;  Laterality: Right;  . CESAREAN SECTION     x 1  . CHOLECYSTECTOMY    . COLONOSCOPY  09/29/2004   normal  . DIAGNOSTIC LAPAROSCOPY     . HERNIA REPAIR  2008  . LAPAROSCOPIC APPENDECTOMY N/A 08/06/2012   Procedure: APPENDECTOMY LAPAROSCOPIC;  Surgeon: Earnstine Regal, MD;  Location[facility];  Service: General;  Laterality: N/A;  . LAPAROSCOPIC GASTRIC BANDING WITH HIATAL HERNIA REPAIR N/A Nov 2008  . TUBAL LIGATION     Family History  Problem Relation Age of Onset  . Cancer Cousin     Breast  . Cirrhosis Mother   . Alcohol abuse Mother   . Liver disease Mother   . Diabetes Father   . Hypertension Father   . Stroke Father   . Heart disease Father   .  AAA (abdominal aortic aneurysm) Sister   . Colon cancer Neg Hx   . Rectal cancer Neg Hx   . Stomach cancer Neg Hx    Social History  Substance Use Topics  . Smoking status: Never Smoker  . Smokeless tobacco: Never Used  . Alcohol use No    ROS Constitutional: Denies fever, chills, weight loss/gain, headaches, insomnia,  night sweats, and change in appetite. Does c/o fatigue. Eyes: Denies redness, blurred vision, diplopia, discharge, itchy, watery eyes.  ENT: Denies discharge, congestion, post nasal drip, epistaxis, sore throat, earache, hearing loss, dental pain, Tinnitus, Vertigo, Sinus pain, snoring.  Cardio: Denies chest pain, palpitations, irregular heartbeat, syncope, dyspnea, diaphoresis, orthopnea, PND, claudication, edema Respiratory: denies cough, dyspnea, DOE, pleurisy, hoarseness, laryngitis, wheezing.  Gastrointestinal: Denies dysphagia, heartburn, reflux, water brash, pain, cramps, nausea, vomiting, bloating, diarrhea, constipation, hematemesis, melena, hematochezia, jaundice, hemorrhoids Genitourinary: Denies dysuria, frequency, urgency, nocturia, hesitancy, discharge, hematuria, flank pain Breast: Breast lumps, nipple discharge, bleeding.  Musculoskeletal: Denies arthralgia, myalgia, stiffness, Jt. Swelling, pain, limp, and strain/sprain. Denies falls. Skin: Denies puritis, rash, hives, warts, acne, eczema, changing in skin lesion Neuro: No weakness,  tremor, incoordination, spasms, paresthesia, pain Psychiatric: Denies confusion, memory loss, sensory loss. Denies Depression. Endocrine: Denies change in weight, skin, hair change, nocturia, and paresthesia, diabetic polys, visual blurring, hyper / hypo glycemic episodes.  Heme/Lymph: No excessive bleeding, bruising, enlarged lymph nodes.  Physical Exam  BP 130/76   Pulse 72   Temp 97.3 F (36.3 C)   Resp 16   Ht 5' 3.5" (1.613 m)   Wt 208 lb (94.3 kg)   BMI 36.27 kg/m   General Appearance: Over nourished and in no apparent distress.  Eyes: PERRLA, EOMs, conjunctiva no swelling or erythema, normal fundi and vessels. Sinuses: No frontal/maxillary tenderness ENT/Mouth: EACs patent / TMs  nl. Nares clear without erythema, swelling, mucoid exudates. Oral hygiene is good. No erythema, swelling, or exudate. Tongue normal, non-obstructing. Tonsils not swollen or erythematous. Hearing normal.  Neck: Supple, thyroid normal. No bruits, nodes or JVD. Respiratory: Respiratory effort normal.  BS equal and clear bilateral without rales, rhonci, wheezing or stridor. Cardio: Heart sounds are normal with regular rate and rhythm and no murmurs, rubs or gallops. Peripheral pulses are normal and equal bilaterally without edema. No aortic or femoral bruits. Chest: symmetric with normal excursions and percussion. Breasts: Symmetric, without lumps, nipple discharge, retractions, or fibrocystic changes.  Abdomen: Flat, soft with bowel sounds active. Nontender, no guarding, rebound, hernias, masses, or organomegaly.  Lymphatics: Non tender without lymphadenopathy.  Genitourinary:  Musculoskeletal: Full ROM all peripheral extremities, joint stability, 5/5 strength, and normal gait. Skin: Warm and dry without rashes, lesions, cyanosis, clubbing or  ecchymosis.  Neuro: Cranial nerves intact, reflexes equal bilaterally. Normal muscle tone, no cerebellar symptoms. Sensation intact to touch, vibratory and  Monofilament to the toes bilaterally.  Pysch: Alert and oriented X 3, normal affect, Insight and Judgment appropriate.   Assessment and Plan  1. Annual Preventative Screening Examination  2. Essential hypertension  - Microalbumin / creatinine urine ratio - EKG 12-Lead - [place], RETROPERITNL ABD,  LTD - Urinalysis, Routine w reflex microscopic - CBC with Differential/Platelet - BASIC METABOLIC PANEL WITH GFR - TSH  3. Mixed hyperlipidemia  - EKG 12-Lead - [place], RETROPERITNL ABD,  LTD - Hepatic function panel - Lipid panel - TSH  4. T2_NIDDM w/CKD2 (HCC)  - EKG 12-Lead - [place], RETROPERITNL ABD,  LTD - HM DIABETES FOOT EXAM - LOW EXTREMITY NEUR EXAM DOCUM -  Hemoglobin A1c - Insulin, random  5. Vitamin D deficiency  - VITAMIN D 25 Hydroxy   6. Screening for rectal cancer  - POC Hemoccult Bld/Stl   7. Screening for AAA (aortic abdominal aneurysm)  - [place], RETROPERITNL ABD,  LTD  8. Screening for ischemic heart disease  - EKG 12-Lead  9. Fatigue, unspecified type  - Vitamin B12 - Iron and TIBC - CBC with Differential/Platelet - TSH  10. Screening examination for pulmonary tuberculosis  - PPD  11. Medication management  - Urinalysis, Routine w reflex microscopic - CBC with Differential/Platelet - BASIC METABOLIC PANEL WITH GFR - Hepatic function panel - Magnesium - Lipid panel - TSH - Hemoglobin A1c - Insulin, random - VITAMIN D 25 Hydroxy   12. Tendonitis  - Diclofenac Sodium 1.5 % SOLN; Apply 1 to 2 pumps 2 x day  Dispense: 1 Bottle; Refill: 11       Continue prudent diet as discussed, weight control, BP monitoring, regular exercise, and medications. Discussed med's effects and SE's. Screening labs and tests as requested with regular follow-up as recommended. Over 40 minutes of exam, counseling, chart review and high complex critical decision making was performed.

## 2016-02-20 NOTE — Patient Instructions (Signed)

## 2016-02-21 LAB — URINALYSIS, MICROSCOPIC ONLY
Bacteria, UA: NONE SEEN [HPF]
RBC / HPF: NONE SEEN RBC/HPF (ref <=2)
YEAST: NONE SEEN [HPF]

## 2016-02-21 LAB — MICROALBUMIN / CREATININE URINE RATIO
Creatinine, Urine: 144 mg/dL (ref 20–320)
MICROALB UR: 1.2 mg/dL
MICROALB/CREAT RATIO: 8 ug/mg{creat} (ref <30)

## 2016-02-21 LAB — HEMOGLOBIN A1C
Hgb A1c MFr Bld: 5.6 %
Mean Plasma Glucose: 114 mg/dL

## 2016-02-21 LAB — VITAMIN D 25 HYDROXY (VIT D DEFICIENCY, FRACTURES): Vit D, 25-Hydroxy: 88 ng/mL (ref 30–100)

## 2016-02-21 LAB — INSULIN, RANDOM: Insulin: 8.8 u[IU]/mL (ref 2.0–19.6)

## 2016-02-21 MED ORDER — DICLOFENAC SODIUM 1.5 % TD SOLN: TRANSDERMAL | 11 refills | Status: DC

## 2016-02-22 ENCOUNTER — Encounter: Payer: Self-pay | Admitting: Internal Medicine

## 2016-02-24 LAB — TB SKIN TEST
Induration: 0 mm
TB SKIN TEST: NEGATIVE

## 2016-03-10 ENCOUNTER — Other Ambulatory Visit: Payer: Self-pay

## 2016-03-10 DIAGNOSIS — Z1212 Encounter for screening for malignant neoplasm of rectum: Secondary | ICD-10-CM

## 2016-03-10 LAB — POC HEMOCCULT BLD/STL (HOME/3-CARD/SCREEN)
Card #3 Fecal Occult Blood, POC: NEGATIVE
FECAL OCCULT BLD: NEGATIVE
FECAL OCCULT BLD: NEGATIVE

## 2016-03-20 ENCOUNTER — Other Ambulatory Visit: Payer: Self-pay | Admitting: Internal Medicine

## 2016-04-09 ENCOUNTER — Other Ambulatory Visit: Payer: Self-pay | Admitting: Internal Medicine

## 2016-04-09 NOTE — Telephone Encounter (Signed)
Please call Alprazolam 

## 2016-04-20 ENCOUNTER — Other Ambulatory Visit: Payer: Self-pay | Admitting: *Deleted

## 2016-04-20 MED ORDER — FENOFIBRATE MICRONIZED 130 MG PO CAPS: 130.0000 mg | ORAL_CAPSULE | Freq: Every day | ORAL | 1 refills | Status: DC

## 2016-05-11 ENCOUNTER — Other Ambulatory Visit: Payer: Self-pay | Admitting: *Deleted

## 2016-05-11 MED ORDER — ANASTROZOLE 1 MG PO TABS: 1.0000 mg | ORAL_TABLET | Freq: Every day | ORAL | 4 refills | Status: DC

## 2016-05-27 ENCOUNTER — Ambulatory Visit: Payer: Self-pay | Admitting: Physician Assistant

## 2016-05-28 ENCOUNTER — Ambulatory Visit (HOSPITAL_BASED_OUTPATIENT_CLINIC_OR_DEPARTMENT_OTHER): Payer: 59 | Admitting: Oncology

## 2016-05-28 ENCOUNTER — Other Ambulatory Visit (HOSPITAL_BASED_OUTPATIENT_CLINIC_OR_DEPARTMENT_OTHER): Payer: 59

## 2016-05-28 VITALS — BP 140/67 | HR 68 | Temp 98.1°F | Resp 20 | Ht 63.5 in | Wt 209.7 lb

## 2016-05-28 DIAGNOSIS — R635 Abnormal weight gain: Secondary | ICD-10-CM

## 2016-05-28 DIAGNOSIS — Z170 Estrogen receptor positive status [ER+]: Secondary | ICD-10-CM | POA: Diagnosis not present

## 2016-05-28 DIAGNOSIS — M791 Myalgia: Secondary | ICD-10-CM | POA: Diagnosis not present

## 2016-05-28 DIAGNOSIS — M2550 Pain in unspecified joint: Secondary | ICD-10-CM

## 2016-05-28 DIAGNOSIS — C50411 Malignant neoplasm of upper-outer quadrant of right female breast: Secondary | ICD-10-CM | POA: Diagnosis not present

## 2016-05-28 LAB — COMPREHENSIVE METABOLIC PANEL
ALT: 25 U/L (ref 0–55)
ANION GAP: 7 meq/L (ref 3–11)
AST: 20 U/L (ref 5–34)
Albumin: 3.9 g/dL (ref 3.5–5.0)
Alkaline Phosphatase: 47 U/L (ref 40–150)
BILIRUBIN TOTAL: 0.35 mg/dL (ref 0.20–1.20)
BUN: 17.3 mg/dL (ref 7.0–26.0)
CALCIUM: 9.9 mg/dL (ref 8.4–10.4)
CHLORIDE: 105 meq/L (ref 98–109)
CO2: 30 meq/L — AB (ref 22–29)
CREATININE: 1 mg/dL (ref 0.6–1.1)
EGFR: 60 mL/min/{1.73_m2} — ABNORMAL LOW (ref >90)
Glucose: 101 mg/dl (ref 70–140)
Potassium: 4.5 mEq/L (ref 3.5–5.1)
Sodium: 141 mEq/L (ref 136–145)
TOTAL PROTEIN: 6.8 g/dL (ref 6.4–8.3)

## 2016-05-28 LAB — CBC WITH DIFFERENTIAL/PLATELET
BASO%: 1.1 % (ref 0.0–2.0)
Basophils Absolute: 0.1 10*3/uL (ref 0.0–0.1)
EOS%: 2 % (ref 0.0–7.0)
Eosinophils Absolute: 0.1 10*3/uL (ref 0.0–0.5)
HEMATOCRIT: 40.4 % (ref 34.8–46.6)
HGB: 13.5 g/dL (ref 11.6–15.9)
LYMPH#: 2.3 10*3/uL (ref 0.9–3.3)
LYMPH%: 34.8 % (ref 14.0–49.7)
MCH: 31.1 pg (ref 25.1–34.0)
MCHC: 33.5 g/dL (ref 31.5–36.0)
MCV: 92.9 fL (ref 79.5–101.0)
MONO#: 0.6 10*3/uL (ref 0.1–0.9)
MONO%: 9.8 % (ref 0.0–14.0)
NEUT%: 52.3 % (ref 38.4–76.8)
NEUTROS ABS: 3.4 10*3/uL (ref 1.5–6.5)
PLATELETS: 388 10*3/uL (ref 145–400)
RBC: 4.34 10*6/uL (ref 3.70–5.45)
RDW: 12.9 % (ref 11.2–14.5)
WBC: 6.6 10*3/uL (ref 3.9–10.3)

## 2016-05-28 NOTE — Progress Notes (Signed)
[place]  [phone] [phone]     ID: Taylor Oconnor DOB: [birth year]  MR#: [id number]  JYN#:[id number]  Patient Care Team: Unk Pinto, MD as PCP - General (Internal Medicine) Alphonsa Overall, MD as Consulting Physician (General Surgery) Cristian Grieves, Virgie Dad, MD as Consulting Physician (Oncology) Thea Silversmith, MD (Inactive) as Consulting Physician (Radiation Oncology) Sylvan Cheese, NP as Nurse Practitioner (Hematology and Oncology) PCP: Unk Pinto, MD OTHER MD:  CHIEF COMPLAINT: Estrogen receptor positive breast cancer  CURRENT TREATMENT:  anastrozole   BREAST CANCER HISTORY: From the original intake note:  Taylor Oconnor's had routine bilateral digital screening mammography 11/16/2014 at the [facility] Center, showing a possible mass in the right breast. Right diagnostic mammography with tomosynthesis and right breast ultrasonography 11/27/2014 showed the breast density to be category B. There was a persistent spiculated mass in the upper outer right breast measuring 6 mm. Posterior to that there was a second mass measuring 1.6 cm. There was no palpable mass by exam. Ultrasonography confirmed a 0.6 cm irregular hypoechoic mass in the right breast at 10:00 7 cm from the nipple and another measuring 0.6 cm also in the 10:00 location but 9 cm from the nipple. The 2 masses were separated by approximately 2.9 cm. The axilla was sonographically benign.  On 11/28/2014 the patient underwent biopsy of the 2 masses in question the more posterior mass at 9 cm from the nipple was an invasive ductal carcinoma, grade 1, estrogen receptor 100% positive, progesterone receptor 85% positive, with an MIB-1 of 10% and no HER-2 amplification, the signals ratio being 1.41 and the number per cell 2.05.  The more anterior mass (measuring 0.6 cm) also showed an invasive ductal carcinoma, grade 1, estrogen receptor 100% positive, but progesterone receptor negative.  The MIB-1 was 5%. This was also HER-2 negative, with a signals ratio 1.29 and the number per cell 2.00.  The patient's subsequent history is as detailed below  INTERVAL HISTORY: Taylor Oconnor returns today for follow-up of her estrogen receptor positive breast cancer. She continues on anastrozole, with good tolerance. Hot flashes and vaginal dryness are not a major issue. She obtains it at a good [facility].  REVIEW OF SYSTEMS:  Taylor Oconnor generally is doing "good. She has gained quite a bit of weight, 25 pounds. She is not exercising. She feels fatigued. She thinks she may be walking up to 1 or 1-1/2 miles at work but she does not otherwise exercise except sometimes on weekends she walks a little bit more. She has pain in her left foot and ankle hips and shoulders, which is not more insistent or intense than before but which does keep her from exercising. She has been found to have bone spurs in her left foot. A detailed review of systems today was otherwise stable.  PAST MEDICAL HISTORY: Past Medical History:  Diagnosis Date  . Allergy    SEASONAL  . Anxiety   . At risk for difficult insertion of breathing tube   . Breast cancer ([place])   . Breast cancer of upper-outer quadrant of right female breast ([place]) 12/03/2014  . 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 GERD (gastroesophageal reflux disease)    hx - resolved with lap band surgery  . H/O hiatal hernia    resolved with band surgery  . Hyperlipidemia   . Hypertension   . Insomnia    tx with zanax  . 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 Type II or unspecified type  diabetes mellitus without mention of complication, not stated as uncontrolled 12/14/2012   no meds, diet controlled    PAST SURGICAL HISTORY: Past Surgical History:  Procedure Laterality Date  . APPENDECTOMY  08/06/12   lap appy  . BACK SURGERY     cervical  . BREAST LUMPECTOMY WITH NEEDLE LOCALIZATION AND AXILLARY SENTINEL LYMPH NODE BX Right 12/28/2014    Procedure: RIGHT BREAST LUMPECTOMY WITH TWO (2) NEEDLE LOCALIZATION AND AXILLARY SENTINEL LYMPH NODE BX;  Surgeon: Alphonsa Overall, MD;  Location: [facility];  Service: General;  Laterality: Right;  . CESAREAN SECTION     x 1  . CHOLECYSTECTOMY    . COLONOSCOPY  09/29/2004   normal  . DIAGNOSTIC LAPAROSCOPY    . HERNIA REPAIR  2008  . LAPAROSCOPIC APPENDECTOMY N/A 08/06/2012   Procedure: APPENDECTOMY LAPAROSCOPIC;  Surgeon: Earnstine Regal, MD;  Location: [facility];  Service: General;  Laterality: N/A;  . LAPAROSCOPIC GASTRIC BANDING WITH HIATAL HERNIA REPAIR N/A Nov 2008  . TUBAL LIGATION      FAMILY HISTORY Family History  Problem Relation Age of Onset  . Cancer Cousin        Breast  . Cirrhosis Mother   . Alcohol abuse Mother   . Liver disease Mother   . Diabetes Father   . Hypertension Father   . Stroke Father   . Heart disease Father   . AAA (abdominal aortic aneurysm) Sister   . Colon cancer Neg Hx   . Rectal cancer Neg Hx   . Stomach cancer Neg Hx    the patient's father died from a stroke at age 78. The patient's mother died from cirrhosis of the liver at age 51. The patient had no brothers. She had 2 sisters, one of whom died from a ruptured aortic aneurysm. There is no history of breast or ovarian cancer in the family to the patient's knowledge  GYNECOLOGIC HISTORY:  No LMP recorded. Patient is postmenopausal. Menarche age 41, first live birth age 23. The patient is GX P1. She stopped having periods in 2002. She did not take hormone replacement. She used oral contraceptives for 11 years remotely, with no complications.  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 [name] is self-employed as a Herbalist area did [name] lives in [place]. She is currently unemployed. She is divorced. The patient's grand daughter, 36 years old, lives in [place] with the child's father. The  patient is not a church attender   ADVANCED DIRECTIVES: Not in place   HEALTH MAINTENANCE: Social History  Substance Use Topics  . Smoking status: Never Smoker  . Smokeless tobacco: Never Used  . Alcohol use No     Colonoscopy: DEC 2016  PAP:  Bone density:  Lipid panel:  Allergies  Allergen Reactions  . Codeine Itching  . Lipitor [Atorvastatin] Other (See Comments)    Severe fatigue. ? memory loss.  . Ace Inhibitors     cough    Current Outpatient Prescriptions  Medication Sig Dispense Refill  . acidophilus (RISAQUAD) CAPS Take 1 capsule by mouth daily. Reported on 02/19/2015    . ALPRAZolam (XANAX) 1 MG tablet TAKE 1/2 TO 1 TABLET 2 TO 3 TIMES A DAY ONLY AS NEEDED FOR SEVERE ANXIETY 270 tablet 0  . anastrozole (ARIMIDEX) 1 MG tablet Take 1 tablet (1 mg total) by mouth daily. 90 tablet 4  . aspirin 81 MG tablet Take 81  mg by mouth daily.    Marland Kitchen atenolol (TENORMIN) 100 MG tablet TAKE 1 TABLET EVERY DAY 90 tablet 1  . b complex vitamins capsule Take 1 capsule by mouth daily.    . calcium carbonate (TUMS) 500 MG chewable tablet Chew 1,000 mg by mouth at bedtime.     . Cholecalciferol (VITAMIN D3) 5000 UNITS CAPS Take 5,000 Units by mouth daily.    . Cinnamon 500 MG TABS Take 1 tablet by mouth 2 (two) times daily. Cinnamon 1000 mg, 1 tab in morning and 1 tab evening    . Diclofenac Sodium 1.5 % SOLN Apply 1 to 2 pumps 2 x day 1 Bottle 11  . fenofibrate micronized (ANTARA) 130 MG capsule Take 1 capsule (130 mg total) by mouth daily before breakfast. 90 capsule 1  . Magnesium 250 MG TABS Take 250 mg by mouth 2 (two) times daily.    . Multiple Vitamins-Minerals (MULTIVITAMIN WITH MINERALS) tablet Take 1 tablet by mouth 2 (two) times daily.    . Omega-3 Fatty Acids (FISH OIL) 1000 MG CAPS Take 1,000 mg by mouth daily.    Marland Kitchen OVER THE COUNTER MEDICATION Take 1 tablet by mouth 2 (two) times daily. Tumeric with black pepper    . PROCTOSOL HC 2.5 % rectal cream APPLY 1 APPLICATORFUL  RECTALLY 4 TIMES DAILY AS DIRECTED 56.7 g 1  . verapamil (CALAN) 80 MG tablet TAKE 1 TABLET BY MOUTH TWICE A DAY FOR BLOOD PRESSURE 180 tablet 1   No current facility-administered medications for this visit.     OBJECTIVE: Middle-aged white womanWho appears stated age 66:   05/28/16 1525  BP: 140/67  Pulse: 68  Resp: 20  Temp: 98.1 F (36.7 C)     Body mass index is 36.56 kg/m.    ECOG FS:1 - Symptomatic but completely ambulatory  Sclerae unicteric, EOMs intact Oropharynx clear and moist No cervical or supraclavicular adenopathy Lungs no rales or rhonchi Heart regular rate and rhythm Abd soft, nontender, positive bowel sounds MSK no focal spinal tenderness, no upper extremity lymphedema Neuro: nonfocal, well oriented, appropriate affect Breasts: The right breast is undergone lumpectomy and radiation with no evidence of local recurrence. The left breast is benign. Both axillae are benign.  LAB RESULTS:  CMP     Component Value Date/Time   NA 139 02/20/2016 1601   NA 142 11/04/2015 1538   K 4.3 02/20/2016 1601   K 3.9 11/04/2015 1538   CL 103 02/20/2016 1601   CO2 27 02/20/2016 1601   CO2 28 11/04/2015 1538   GLUCOSE 97 02/20/2016 1601   GLUCOSE 110 11/04/2015 1538   BUN 19 02/20/2016 1601   BUN 14.7 11/04/2015 1538   CREATININE 0.98 02/20/2016 1601   CREATININE 1.1 11/04/2015 1538   CALCIUM 9.6 02/20/2016 1601   CALCIUM 9.5 11/04/2015 1538   PROT 6.8 02/20/2016 1601   PROT 6.5 11/04/2015 1538   ALBUMIN 4.3 02/20/2016 1601   ALBUMIN 3.4 (L) 11/04/2015 1538   AST 27 02/20/2016 1601   AST 21 11/04/2015 1538   ALT 32 (H) 02/20/2016 1601   ALT 25 11/04/2015 1538   ALKPHOS 44 02/20/2016 1601   ALKPHOS 48 11/04/2015 1538   BILITOT 0.4 02/20/2016 1601   BILITOT <0.22 11/04/2015 1538   GFRNONAA 61 02/20/2016 1601   GFRAA 71 02/20/2016 1601    INo results found for: SPEP, UPEP  Lab Results  Component Value Date   WBC 6.6 05/28/2016   NEUTROABS 3.4  05/28/2016  HGB 13.5 05/28/2016   HCT 40.4 05/28/2016   MCV 92.9 05/28/2016   PLT 388 05/28/2016      Chemistry      Component Value Date/Time   NA 139 02/20/2016 1601   NA 142 11/04/2015 1538   K 4.3 02/20/2016 1601   K 3.9 11/04/2015 1538   CL 103 02/20/2016 1601   CO2 27 02/20/2016 1601   CO2 28 11/04/2015 1538   BUN 19 02/20/2016 1601   BUN 14.7 11/04/2015 1538   CREATININE 0.98 02/20/2016 1601   CREATININE 1.1 11/04/2015 1538      Component Value Date/Time   CALCIUM 9.6 02/20/2016 1601   CALCIUM 9.5 11/04/2015 1538   ALKPHOS 44 02/20/2016 1601   ALKPHOS 48 11/04/2015 1538   AST 27 02/20/2016 1601   AST 21 11/04/2015 1538   ALT 32 (H) 02/20/2016 1601   ALT 25 11/04/2015 1538   BILITOT 0.4 02/20/2016 1601   BILITOT <0.22 11/04/2015 1538       No results found for: LABCA2  No components found for: LABCA125  No results for input(s): INR in the last 168 hours.  Urinalysis    Component Value Date/Time   COLORURINE YELLOW 02/20/2016 1601   APPEARANCEUR CLEAR 02/20/2016 1601   LABSPEC 1.019 02/20/2016 1601   PHURINE 6.0 02/20/2016 1601   GLUCOSEU NEGATIVE 02/20/2016 1601   HGBUR NEGATIVE 02/20/2016 1601   BILIRUBINUR NEGATIVE 02/20/2016 1601   KETONESUR NEGATIVE 02/20/2016 1601   PROTEINUR NEGATIVE 02/20/2016 1601   UROBILINOGEN 0.2 05/30/2013 0903   NITRITE NEGATIVE 02/20/2016 1601   LEUKOCYTESUR 1+ (A) 02/20/2016 1601    STUDIES: Bilateral mammography with tomography 11/18/2015 found the breast density to be category B. There was no evidence of malignancy.  ASSESSMENT: 66 y.o. [place] woman status post Right breast upper outer quadrant biopsy 11/28/2014 of 2 separate right breast masses, both invasive ductal carcinomas, grade 1, estrogen receptor positive, and HER-2 not amplified. MIB-1 is 5-10%. The smaller mass is progesterone receptor negative  (1) Status post right lumpectomy and sentinel lymph node sampling 12/28/2014 for an mpT1c pN0, stage IA  invasive ductal carcinoma, grade 2, repeat HER-2 again negative  (2) Oncotype score of 16 predict sent outside the breast risk of recurrence within 10 years of 10% if the patient's only systemic therapy is tamoxifen for 5 years. It also predicts no benefit from adjuvant chemotherapy.  (3) adjuvant radiation 02/18/2015-04/03/2015:  1. Right breast / 45 Gray at 1.8 Gray per fraction x 25 fractions 2. Right breast boost / 16 Gray at [employer] per fraction x 8 fractions  (4) Anastrozole started 05/13/2015  (a) bone density 01/18/2015 was normal with a T score of 0.4  (5) polycystic kidney disease  (a) testosterone level 10 ng/dL [normal 6-86 in women] 07/17/2015   PLAN   Jannell is now a year and a half out from definitive surgery from her breast cancer with no evidence of disease recurrence. This is very favorable.  We discussed weight issues and she understands exercises necessary but not sufficient. She will have to change her diet as well. At this point she is not interested in a nutritional referral.  I do not know if the arthralgias and myalgias she is having are related to anastrozole or not. The only way to find out is to stop the medication. I think it would be reasonable for her to stop it on her birthday, 10/04/2015. She will see me approximately 2 months after that. If she feels considerably  relieved we will switch to tamoxifen or a different aromatase inhibitor. If she does not feel any better we will continue anastrozole to a total of 5 years  She has a good understanding of this plan. She agrees with it. She will let me know if any other problems develop before the next visit. Chauncey Cruel, MD   05/28/2016 3:32 PM Medical Oncology and Hematology [facility] [address] [place], [place] [address] Tel. [phone]    Fax. [phone]

## 2016-06-01 NOTE — Progress Notes (Signed)
Assessment and Plan:   Hypertension -Continue medication, monitor blood pressure at home. Continue DASH diet.  Reminder to go to the ER if any CP, SOB, nausea, dizziness, severe HA, changes vision/speech, left arm numbness and tingling and jaw pain.   Cholesterol -Continue diet and exercise. Check cholesterol.   Diabetes with diabetic chronic kidney disease -Continue diet and exercise. Check A1C   Vitamin D Def - check level and continue medications.    Obesity with co morbidities-  long discussion about weight loss, diet, and exercise  Medication management -     Magnesium  Fatigue, unspecified type See below -     Sedimentation rate -     C-reactive protein -     CK -     ANCA screen with reflex titer -     RNP Antibody -     ANA  Polyarthralgia, family history of connective issues Will rule out autoimmune, continue to work on diet Has had negative sleep study before lap band, however very large tonsils, + fatigue, sleeps easily, however states refreshed in the morning, may still get at home sleep study -     Sedimentation rate -     C-reactive protein -     CK -     ANCA screen with reflex titer -     RNP Antibody -     ANA     Continue diet and meds as discussed. Further disposition pending results of labs. Discussed med's effects and SE's.   Over 30 minutes of exam, counseling, chart review, and critical decision making was performed  HPI 66 y.o. female  presents for 3 month follow up on hypertension, cholesterol, diabetes and vitamin D deficiency.   Her blood pressure has been controlled at home, today her BP is BP: 134/74.  She does workout. She denies chest pain, shortness of breath, dizziness.  She is not on cholesterol medication, she is on fenofibrate and denies myalgias. Her cholesterol is at goal. The cholesterol was:   Lab Results  Component Value Date   CHOL 200 (H) 02/20/2016   HDL 81 02/20/2016   LDLCALC 95 02/20/2016   TRIG 119 02/20/2016   CHOLHDL 2.5 02/20/2016   She has been working on diet and exercise for diabetes with diabetic chronic kidney disease, she is on bASA, she is not on ACE/ARB due to an allergy, she is on MF 4 a day, and denies  paresthesia of the feet, polydipsia, polyuria and visual disturbances. Last A1C was:  Lab Results  Component Value Date   HGBA1C 5.6 02/20/2016  Patient is on Vitamin D supplement. Lab Results  Component Value Date   VD25OH 88 02/20/2016  BMI is Body mass index is 36.76 kg/m., she is working on diet and exercise, she had lap band surgery in 2008 and weight is down from 229.She is unable to tolerate phentermine.  She has significant fatigue, follows with Dr. Griffith Citron, will fall asleep easily, does snore but she states her energy level is good in the morning, has achy joints, bilateral ankles, bilateral hips. She had a normal iron 65, B12 837. She does have a family history of AAA and breast cancer.  Wt Readings from Last 3 Encounters:  06/02/16 210 lb 12.8 oz (95.6 kg)  05/28/16 209 lb 11.2 oz (95.1 kg)  02/20/16 208 lb (94.3 kg)    Current Medications:  Current Outpatient Prescriptions on File Prior to Visit  Medication Sig Dispense Refill  . acidophilus (RISAQUAD) CAPS Take  1 capsule by mouth daily. Reported on 02/19/2015    . ALPRAZolam (XANAX) 1 MG tablet TAKE 1/2 TO 1 TABLET 2 TO 3 TIMES A DAY ONLY AS NEEDED FOR SEVERE ANXIETY 270 tablet 0  . anastrozole (ARIMIDEX) 1 MG tablet Take 1 tablet (1 mg total) by mouth daily. 90 tablet 4  . aspirin 81 MG tablet Take 81 mg by mouth daily.    Marland Kitchen atenolol (TENORMIN) 100 MG tablet TAKE 1 TABLET EVERY DAY 90 tablet 1  . b complex vitamins capsule Take 1 capsule by mouth daily.    . calcium carbonate (TUMS) 500 MG chewable tablet Chew 1,000 mg by mouth at bedtime.     . Cholecalciferol (VITAMIN D3) 5000 UNITS CAPS Take 5,000 Units by mouth daily.    . Cinnamon 500 MG TABS Take 1 tablet by mouth 2 (two) times daily. Cinnamon 1000 mg, 1 tab in  morning and 1 tab evening    . fenofibrate micronized (ANTARA) 130 MG capsule Take 1 capsule (130 mg total) by mouth daily before breakfast. 90 capsule 1  . Magnesium 250 MG TABS Take 250 mg by mouth 2 (two) times daily.    . Multiple Vitamins-Minerals (MULTIVITAMIN WITH MINERALS) tablet Take 1 tablet by mouth 2 (two) times daily.    . Omega-3 Fatty Acids (FISH OIL) 1000 MG CAPS Take 1,000 mg by mouth daily.    Marland Kitchen OVER THE COUNTER MEDICATION Take 1 tablet by mouth 2 (two) times daily. Tumeric with black pepper    . PROCTOSOL HC 2.5 % rectal cream APPLY 1 APPLICATORFUL RECTALLY 4 TIMES DAILY AS DIRECTED 56.7 g 1  . verapamil (CALAN) 80 MG tablet TAKE 1 TABLET BY MOUTH TWICE A DAY FOR BLOOD PRESSURE 180 tablet 1   No current facility-administered medications on file prior to visit.    Medical History:  Past Medical History:  Diagnosis Date  . Allergy    SEASONAL  . Anxiety   . At risk for difficult insertion of breathing tube   . Breast cancer ([place])   . Breast cancer of upper-outer quadrant of right female breast ([place]) 12/03/2014  . 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 GERD (gastroesophageal reflux disease)    hx - resolved with lap band surgery  . H/O hiatal hernia    resolved with band surgery  . Hyperlipidemia   . Hypertension   . Insomnia    tx with zanax  . 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 Type II or unspecified type diabetes mellitus without mention of complication, not stated as uncontrolled 12/14/2012   no meds, diet controlled   Allergies:  Allergies  Allergen Reactions  . Codeine Itching  . Lipitor [Atorvastatin] Other (See Comments)    Severe fatigue. ? memory loss.  . Ace Inhibitors     cough    Review of Systems:  Review of Systems  Constitutional: Negative.   HENT: Negative.   Eyes: Negative.   Respiratory: Negative.   Cardiovascular: Negative.   Gastrointestinal: Negative for abdominal pain, blood in stool, constipation,  diarrhea, heartburn, melena, nausea and vomiting.  Genitourinary: Negative.   Musculoskeletal: Negative.   Skin: Negative.   Neurological: Negative.   Endo/Heme/Allergies: Negative.   Psychiatric/Behavioral: Negative.     Family history- Review and unchanged Social history- Review and unchanged Physical Exam: BP 134/74   Pulse 79   Temp 97.9 F (36.6 C)   Resp 14   Ht 5' 3.5" (1.613 m)  Wt 210 lb 12.8 oz (95.6 kg)   SpO2 97%   BMI 36.76 kg/m  Wt Readings from Last 3 Encounters:  06/02/16 210 lb 12.8 oz (95.6 kg)  05/28/16 209 lb 11.2 oz (95.1 kg)  02/20/16 208 lb (94.3 kg)   General Appearance: Well nourished, in no apparent distress. Eyes: PERRLA, EOMs, conjunctiva no swelling or erythema Sinuses: No Frontal/maxillary tenderness ENT/Mouth: Ext aud canals clear, TMs without erythema, bulging. No erythema, swelling, or exudate on post pharynx.  Tonsils not swollen or erythematous. Hearing normal.  Neck: Supple, thyroid normal.  Respiratory: Respiratory effort normal, BS equal bilaterally without rales, rhonchi, wheezing or stridor.  Cardio: RRR with no MRGs. Brisk peripheral pulses without edema.  Abdomen: Soft, + BS.  Non tender, no guarding, rebound, hernias, masses. Lymphatics: Non tender without lymphadenopathy.  Musculoskeletal: Full ROM, 5/5 strength, Normal gait Anus: at 6 o clock has nodule/flap that is not erythematous, tender.  Skin: Warm, dry without rashes, lesions, ecchymosis.  Neuro: Cranial nerves intact. No cerebellar symptoms.  Psych: Awake and oriented X 3, normal affect, Insight and Judgment appropriate.    Vicie Mutters, PA-C 4:28 PM [facility]

## 2016-06-02 ENCOUNTER — Encounter: Payer: Self-pay | Admitting: Physician Assistant

## 2016-06-02 ENCOUNTER — Ambulatory Visit (INDEPENDENT_AMBULATORY_CARE_PROVIDER_SITE_OTHER): Payer: 59 | Admitting: Physician Assistant

## 2016-06-02 VITALS — BP 134/74 | HR 79 | Temp 97.9°F | Resp 14 | Ht 63.5 in | Wt 210.8 lb

## 2016-06-02 DIAGNOSIS — Z79899 Other long term (current) drug therapy: Secondary | ICD-10-CM

## 2016-06-02 DIAGNOSIS — E559 Vitamin D deficiency, unspecified: Secondary | ICD-10-CM | POA: Diagnosis not present

## 2016-06-02 DIAGNOSIS — R5383 Other fatigue: Secondary | ICD-10-CM | POA: Diagnosis not present

## 2016-06-02 DIAGNOSIS — N182 Chronic kidney disease, stage 2 (mild): Secondary | ICD-10-CM | POA: Diagnosis not present

## 2016-06-02 DIAGNOSIS — M2550 Pain in unspecified joint: Secondary | ICD-10-CM

## 2016-06-02 DIAGNOSIS — E782 Mixed hyperlipidemia: Secondary | ICD-10-CM

## 2016-06-02 DIAGNOSIS — E1122 Type 2 diabetes mellitus with diabetic chronic kidney disease: Secondary | ICD-10-CM | POA: Diagnosis not present

## 2016-06-02 DIAGNOSIS — I10 Essential (primary) hypertension: Secondary | ICD-10-CM

## 2016-06-02 LAB — LIPID PANEL
CHOLESTEROL: 215 mg/dL — AB (ref <200)
HDL: 77 mg/dL (ref >50)
LDL Cholesterol: 117 mg/dL — ABNORMAL HIGH (ref <100)
TRIGLYCERIDES: 105 mg/dL (ref <150)
Total CHOL/HDL Ratio: 2.8 Ratio (ref <5.0)
VLDL: 21 mg/dL (ref <30)

## 2016-06-02 LAB — CBC WITH DIFFERENTIAL/PLATELET
BASOS ABS: 66 {cells}/uL (ref 0–200)
Basophils Relative: 1 %
EOS PCT: 2 %
Eosinophils Absolute: 132 cells/uL (ref 15–500)
HCT: 39.6 % (ref 35.0–45.0)
Hemoglobin: 13.1 g/dL (ref 11.7–15.5)
LYMPHS PCT: 33 %
Lymphs Abs: 2178 cells/uL (ref 850–3900)
MCH: 30.8 pg (ref 27.0–33.0)
MCHC: 33.1 g/dL (ref 32.0–36.0)
MCV: 93 fL (ref 80.0–100.0)
MONOS PCT: 7 %
MPV: 8.9 fL (ref 7.5–12.5)
Monocytes Absolute: 462 cells/uL (ref 200–950)
NEUTROS ABS: 3762 {cells}/uL (ref 1500–7800)
NEUTROS PCT: 57 %
PLATELETS: 406 10*3/uL — AB (ref 140–400)
RBC: 4.26 MIL/uL (ref 3.80–5.10)
RDW: 13.5 % (ref 11.0–15.0)
WBC: 6.6 10*3/uL (ref 3.8–10.8)

## 2016-06-02 LAB — BASIC METABOLIC PANEL WITH GFR
BUN: 26 mg/dL — ABNORMAL HIGH (ref 7–25)
CALCIUM: 9.5 mg/dL (ref 8.6–10.4)
CO2: 23 mmol/L (ref 20–31)
Chloride: 102 mmol/L (ref 98–110)
Creat: 1.21 mg/dL — ABNORMAL HIGH (ref 0.50–0.99)
GFR, EST AFRICAN AMERICAN: 55 mL/min — AB (ref >=60)
GFR, EST NON AFRICAN AMERICAN: 47 mL/min — AB (ref >=60)
GLUCOSE: 91 mg/dL (ref 65–99)
POTASSIUM: 4.2 mmol/L (ref 3.5–5.3)
Sodium: 139 mmol/L (ref 135–146)

## 2016-06-02 LAB — CK: CK TOTAL: 88 U/L (ref 29–143)

## 2016-06-02 LAB — HEPATIC FUNCTION PANEL
ALBUMIN: 4.1 g/dL (ref 3.6–5.1)
ALT: 21 U/L (ref 6–29)
AST: 19 U/L (ref 10–35)
Alkaline Phosphatase: 42 U/L (ref 33–130)
BILIRUBIN TOTAL: 0.4 mg/dL (ref 0.2–1.2)
Bilirubin, Direct: 0.1 mg/dL (ref <=0.2)
Indirect Bilirubin: 0.3 mg/dL (ref 0.2–1.2)
TOTAL PROTEIN: 6.5 g/dL (ref 6.1–8.1)

## 2016-06-02 LAB — MAGNESIUM: MAGNESIUM: 1.9 mg/dL (ref 1.5–2.5)

## 2016-06-02 NOTE — Patient Instructions (Addendum)
Fatigue Fatigue is feeling tired all of the time, a lack of energy, or a lack of motivation. Occasional or mild fatigue is often a normal response to activity or life in general. However, long-lasting (chronic) or extreme fatigue may indicate an underlying medical condition. Follow these instructions at home: Watch your fatigue for any changes. The following actions may help to lessen any discomfort you are feeling:  Talk to your health care provider about how much sleep you need each night. Try to get the required amount every night.  Take medicines only as directed by your health care provider.  Eat a healthy and nutritious diet. Ask your health care provider if you need help changing your diet.  Drink enough fluid to keep your urine clear or pale yellow.  Practice ways of relaxing, such as yoga, meditation, massage therapy, or acupuncture.  Exercise regularly.  Change situations that cause you stress. Try to keep your work and personal routine reasonable.  Do not abuse illegal drugs.  Limit alcohol intake to no more than 1 drink per day for nonpregnant women and 2 drinks per day for men. One drink equals 12 ounces of beer, 5 ounces of wine, or 1 ounces of hard liquor.  Take a multivitamin, if directed by your health care provider. Contact a health care provider if:  Your fatigue does not get better.  You have a fever.  You have unintentional weight loss or gain.  You have headaches.  You have difficulty:  Falling asleep.  Sleeping throughout the night.  You feel angry, guilty, anxious, or sad.  You are unable to have a bowel movement (constipation).  You skin is dry.  Your legs or another part of your body is swollen. Get help right away if:  You feel confused.  Your vision is blurry.  You feel faint or pass out.  You have a severe headache.  You have severe abdominal, pelvic, or back pain.  You have chest pain, shortness of breath, or an irregular or  fast heartbeat.  You are unable to urinate or you urinate less than normal.  You develop abnormal bleeding, such as bleeding from the rectum, vagina, nose, lungs, or nipples.  You vomit blood.  You have thoughts about harming yourself or committing suicide.  You are worried that you might harm someone else. This information is not intended to replace advice given to you by your health care provider. Make sure you discuss any questions you have with your health care provider. Document Released: 10/26/2006 Document Revised: 06/06/2015 Document Reviewed: 05/02/2013 Elsevier Interactive Patient Education  2017 Honor Qualifies for Obesity Medications? Although everyone is hopeful for a fast and easy way to lose weight, nothing has been shown to replace a prudent, calorie-controlled diet along with behavior modification as a cornerstone for all obesity treatments.  The next tool that can be used to achieve weight-loss and health improvement is medication. Pharmacotherapy may be offered to individuals affected by obesity who have failed to achieve weight-loss through diet and exercise alone. Currently there are several drugs that are approved by the FDA for weight-loss: lorcaserin HCI (Belviq) phentermine- topiramate ER (Qsymia)  Bupropion; Naltrexone ER (Contrave)  Let's take a closer look at each of these medications and learn how they work:   Lorcaserin Economist) How does it work? Lorcaserin was approved in June 2012 by the FDA and became commercially available in June 2013. It works by helping you feel full while eating less, and it  works on the chemicals in your brain to help decrease your appetite. Weight-loss: In individuals who took the medication for one-year, it has been shown to have an average of 7 percent weight-loss. In a 200 pound person, this would mean a 14 pound weight-loss. Blood sugar, cholesterol and blood pressure levels have also been shown to  improve. Concerns: The most common side effects are headache, dizziness, fatigue, dry mouth, upper UVO:ZDGUYQIH tract infection and nausea.  Response to therapy should be evaluated by week 12.  If a patient has not lost at least 5% of baseline body weigh  Phentermine-Topiramate ER (Qsymia) How does it work? This combination medication was approved by the FDA in July 2012. Topiramate is a medication used to treat seizures. It was found that a common side effect of this medication was weight-loss. Phentermine, as described in this brochure, helps to increase your energy and decrease your appetite. Weight-loss: Among individuals who took the highest does of Qsymia (15 mg phentermine and 92 mg of topiramate ER) for one-year, they achieved an average of 14.4 percent weight-loss. In a 200 pound person, a 14.4 percent weight-loss would mean a loss of 29 pounds. Cholesterol levels have also been shown to improve. Concerns: The most common side effects were dry mouth, constipation and pins-and-needle feeling in extremities. Qsymia should NOT be taken during pregnancy since Topiramate ER, a component of Qsymia, has been associated with an increased risk of birth defects.  Bupropion; Naltrexone ER (Contrave) How does it work? Works in two areas of your brain, hunger center and reward center to reduce hunger and cravings.  Weight loss In a 56 week trial patients lost more than 5% of their body weight.  Concerns Most common side effects are dry mouth, constipation or diarrhea, headache.  Please take it with a full glass of water and low fat meal.    Follow-up Visits: Patients are given the opportunity to revisit a topic or obtain more information on an area of interest during follow-up visits. The frequency of and interval between follow-up visits is determined on a patient-by-patient basis. Frequent visits (every 3 to 4 weeks) are encouraged until initial weight-loss goals (5 to 10 percent of body  weight) are achieved. At that point, less frequent visits are typically scheduled as needed for individual patients. However, since obesity is considered a chronic life-long problem for many individuals, periodic continual follow up is recommended.   Research has shown that weight-loss as low as 5 percent of initial body weight can lead to favorable improvements in blood pressure, cholesterol, glucose levels and insulin sensitivity. The risk of developing heart disease is reduced the most in patients who have impaired glucose tolerance, type 2 diabetes or high blood pressure.     Simple math prevails.    1st - exercise does not produce significant weight loss - at best one converts fat into muscle , "bulks up", loses inches, but usually stays "weight neutral"     2nd - think of your body weightas a check book: If you eat more calories than you burn up - you save money or gain weight .... Or if you spend more money than you put in the check book, ie burn up more calories than you eat, then you lose weight     3rd - if you walk or run 1 mile, you burn up 100 calories - you have to burn up 3,500 calories to lose 1 pound, ie you have to walk/run 35 miles to lose 1  measly pound. So if you want to lose 10 #, then you have to walk/run 350 miles, so.... clearly exercise is not the solution.     4. So if you consume 1,500 calories, then you have to burn up the equivalent of 15 miles to stay weight neutral - It also stands to reason that if you consume 1,500 cal/day and don't lose weight, then you must be burning up about 1,500 cals/day to stay weight neutral.     5. If you really want to lose weight, you must cut your calorie intake 300 calories /day and at that rate you should lose about 1 # every 3 days.   6. Please purchase Dr Fara Olden Fuhrman's book(s) "The End of Dieting" & "Eat to Live" . It has some great concepts and recipes.

## 2016-06-03 ENCOUNTER — Encounter: Payer: Self-pay | Admitting: Physician Assistant

## 2016-06-03 LAB — HEMOGLOBIN A1C
Hgb A1c MFr Bld: 5.7 % — ABNORMAL HIGH (ref <5.7)
Mean Plasma Glucose: 117 mg/dL

## 2016-06-03 LAB — SEDIMENTATION RATE: SED RATE: 4 mm/h (ref 0–30)

## 2016-06-03 LAB — VITAMIN D 25 HYDROXY (VIT D DEFICIENCY, FRACTURES): VIT D 25 HYDROXY: 74 ng/mL (ref 30–100)

## 2016-06-03 LAB — TSH: TSH: 2.31 m[IU]/L

## 2016-06-03 LAB — C-REACTIVE PROTEIN: CRP: 4.5 mg/L (ref <8.0)

## 2016-06-03 LAB — ANA: Anti Nuclear Antibody(ANA): NEGATIVE

## 2016-06-03 LAB — ANCA SCREEN W REFLEX TITER: ANCA SCREEN: NEGATIVE

## 2016-06-03 LAB — RNP ANTIBODY: Ribonucleic Protein(ENA) Antibody, IgG: <1

## 2016-06-04 ENCOUNTER — Encounter: Payer: Self-pay | Admitting: Physician Assistant

## 2016-06-04 MED ORDER — BUPROPION HCL ER (XL) 150 MG PO TB24: 150.0000 mg | ORAL_TABLET | ORAL | 2 refills | Status: DC

## 2016-06-09 ENCOUNTER — Encounter: Payer: Self-pay | Admitting: Physician Assistant

## 2016-06-09 ENCOUNTER — Ambulatory Visit (INDEPENDENT_AMBULATORY_CARE_PROVIDER_SITE_OTHER): Payer: 59 | Admitting: Physician Assistant

## 2016-06-09 VITALS — BP 130/80 | HR 85 | Temp 97.5°F | Resp 16 | Ht 63.5 in | Wt 208.6 lb

## 2016-06-09 DIAGNOSIS — J029 Acute pharyngitis, unspecified: Secondary | ICD-10-CM | POA: Diagnosis not present

## 2016-06-09 MED ORDER — PROMETHAZINE-DM 6.25-15 MG/5ML PO SYRP: 5.0000 mL | ORAL_SOLUTION | Freq: Four times a day (QID) | ORAL | 1 refills | Status: DC | PRN

## 2016-06-09 MED ORDER — AZITHROMYCIN 250 MG PO TABS: ORAL_TABLET | ORAL | 1 refills | Status: AC

## 2016-06-09 NOTE — Progress Notes (Signed)
Subjective:    Patient ID: Taylor Oconnor, female    DOB: August 26, 1950, 66 y.o.   MRN: [id number]  HPI 66 y.o. obese WF presents with sinus issues x Friday (5 days). Sore throat, sinus drainage, tonsils swollen, difficult to swallow, no fever, chills.   Blood pressure 130/80, pulse 85, temperature 97.5 F (36.4 C), resp. rate 16, height 5' 3.5" (1.613 m), weight 208 lb 9.6 oz (94.6 kg), SpO2 95 %.  Medications Current Outpatient Prescriptions on File Prior to Visit  Medication Sig  . acidophilus (RISAQUAD) CAPS Take 1 capsule by mouth daily. Reported on 02/19/2015  . ALPRAZolam (XANAX) 1 MG tablet TAKE 1/2 TO 1 TABLET 2 TO 3 TIMES A DAY ONLY AS NEEDED FOR SEVERE ANXIETY  . anastrozole (ARIMIDEX) 1 MG tablet Take 1 tablet (1 mg total) by mouth daily.  Marland Kitchen aspirin 81 MG tablet Take 81 mg by mouth daily.  Marland Kitchen atenolol (TENORMIN) 100 MG tablet TAKE 1 TABLET EVERY DAY  . b complex vitamins capsule Take 1 capsule by mouth daily.  Marland Kitchen buPROPion (WELLBUTRIN XL) 150 MG 24 hr tablet Take 1 tablet (150 mg total) by mouth every morning.  . calcium carbonate (TUMS) 500 MG chewable tablet Chew 1,000 mg by mouth at bedtime.   . Cholecalciferol (VITAMIN D3) 5000 UNITS CAPS Take 5,000 Units by mouth daily.  . Cinnamon 500 MG TABS Take 1 tablet by mouth 2 (two) times daily. Cinnamon 1000 mg, 1 tab in morning and 1 tab evening  . fenofibrate micronized (ANTARA) 130 MG capsule Take 1 capsule (130 mg total) by mouth daily before breakfast.  . Magnesium 250 MG TABS Take 250 mg by mouth 2 (two) times daily.  . Multiple Vitamins-Minerals (MULTIVITAMIN WITH MINERALS) tablet Take 1 tablet by mouth 2 (two) times daily.  . Omega-3 Fatty Acids (FISH OIL) 1000 MG CAPS Take 1,000 mg by mouth daily.  Marland Kitchen OVER THE COUNTER MEDICATION Take 1 tablet by mouth 2 (two) times daily. Tumeric with black pepper  . PROCTOSOL HC 2.5 % rectal cream APPLY 1 APPLICATORFUL RECTALLY 4 TIMES DAILY AS DIRECTED  . verapamil (CALAN) 80 MG tablet TAKE  1 TABLET BY MOUTH TWICE A DAY FOR BLOOD PRESSURE   No current facility-administered medications on file prior to visit.     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   Review of Systems  Constitutional: Positive for chills. Negative for diaphoresis.  HENT: Positive for congestion, ear pain, sinus pressure and sore throat. Negative for sneezing.   Respiratory: Negative.  Negative for cough and shortness of breath.   Cardiovascular: Negative.   Musculoskeletal: Positive for neck pain.  Neurological: Negative.  Negative for headaches.       Objective:   Physical Exam  Constitutional: She is oriented to person, place, and time. She appears well-developed and well-nourished. No distress.  HENT:  Head: Normocephalic and atraumatic.  Right Ear: External ear normal.  Left Ear: External ear normal.  Nose: Nose normal.  Mouth/Throat: Uvula is midline and mucous membranes are normal. Posterior oropharyngeal edema and posterior oropharyngeal erythema present. No oropharyngeal exudate or tonsillar abscesses.  Cloudy TM's bilaterally  Eyes: Conjunctivae and EOM are normal. Pupils are equal, round, and reactive to light.  Neck: Normal range  of motion. Neck supple. No JVD present. No thyromegaly present.  Cardiovascular: Normal rate, regular rhythm, normal heart sounds and intact distal pulses.   Pulmonary/Chest: Effort normal and breath sounds normal.  Abdominal: Soft. Bowel sounds are normal. She exhibits no distension and no mass. There is no tenderness. There is no rebound and no guarding.  Musculoskeletal: Normal range of motion. She exhibits no edema or tenderness.  Lymphadenopathy:    She has cervical  adenopathy.  Neurological: She is alert and oriented to person, place, and time. No cranial nerve deficit.  Skin: Skin is warm and dry. No rash noted. No erythema. No pallor.  Psychiatric: She has a normal mood and affect. Her behavior is normal. Judgment and thought content normal.  Nursing note and vitals reviewed.      Assessment & Plan:  1. Pharyngitis, unspecified etiology Pharyngitis: take medicationss as prescribed, increase fluids,  Salt water gargles. If symptoms do not improve in 5-7 days or get worse contact the office or go to ER. - azithromycin (ZITHROMAX) 250 MG tablet; Take 2 tablets (500 mg) on  Day 1,  followed by 1 tablet (250 mg) once daily on Days 2 through 5.  Dispense: 6 each; Refill: 1 - promethazine-dextromethorphan (PROMETHAZINE-DM) 6.25-15 MG/5ML syrup; Take 5 mLs by mouth 4 (four) times daily as needed for cough.  Dispense: 240 mL; Refill: 1  The patient was advised to call immediately if she has any concerning symptoms in the interval. The patient voices understanding of current treatment options and is in agreement with the current care plan.The patient knows to call the clinic with any problems, questions or concerns or go to the ER if any further progression of symptoms.

## 2016-06-09 NOTE — Patient Instructions (Signed)
-  Make sure you are drinking plenty of fluids to stay hydrated.  -while drinking fluids pinch and hold nose close and swallow, to help open eustachian tubes and drain fluid behind ear drums. -you can do salt water gargles. You can also do 1 TSP liquid Maalox and 1 TSP liquid benadryl- mix/ gargle/ spit  If you are not feeling better in 10-14 days, then please call the office.  Pharyngitis Pharyngitis is redness, pain, and swelling (inflammation) of your pharynx.  CAUSES  Pharyngitis is usually caused by infection. Most of the time, these infections are from viruses (viral) and are part of a cold. However, sometimes pharyngitis is caused by bacteria (bacterial). Pharyngitis can also be caused by allergies. Viral pharyngitis may be spread from person to person by coughing, sneezing, and personal items or utensils (cups, forks, spoons, toothbrushes). Bacterial pharyngitis may be spread from person to person by more intimate contact, such as kissing.  SIGNS AND SYMPTOMS  Symptoms of pharyngitis include:   Sore throat.   Tiredness (fatigue).   Low-grade fever.   Headache.  Joint pain and muscle aches.  Skin rashes.  Swollen lymph nodes.  Plaque-like film on throat or tonsils (often seen with bacterial pharyngitis). DIAGNOSIS  Your health care provider will ask you questions about your illness and your symptoms. Your medical history, along with a physical exam, is often all that is needed to diagnose pharyngitis. Sometimes, a rapid strep test is done. Other lab tests may also be done, depending on the suspected cause.  TREATMENT  Viral pharyngitis will usually get better in 3-4 days without the use of medicine. Bacterial pharyngitis is treated with medicines that kill germs (antibiotics).  HOME CARE INSTRUCTIONS   Drink enough water and fluids to keep your urine clear or pale yellow.   Only take over-the-counter or prescription medicines as directed by your health care  provider:   If you are prescribed antibiotics, make sure you finish them even if you start to feel better.   Do not take aspirin.   Get lots of rest.   Gargle with 8 oz of salt water ( tsp of salt per 1 qt of water) as often as every 1-2 hours to soothe your throat.   Throat lozenges (if you are not at risk for choking) or sprays may be used to soothe your throat. SEEK MEDICAL CARE IF:   You have large, tender lumps in your neck.  You have a rash.  You cough up green, yellow-brown, or bloody spit. SEEK IMMEDIATE MEDICAL CARE IF:   Your neck becomes stiff.  You drool or are unable to swallow liquids.  You vomit or are unable to keep medicines or liquids down.  You have severe pain that does not go away with the use of recommended medicines.  You have trouble breathing (not caused by a stuffy nose). MAKE SURE YOU:   Understand these instructions.  Will watch your condition.  Will get help right away if you are not doing well or get worse. Document Released: 12/29/2004 Document Revised: 10/19/2012 Document Reviewed: 09/05/2012 [employer] Patient Information 2015 [employer]. This information is not intended to replace advice given to you by your health care provider. Make sure you discuss any questions you have with your health care provider.  

## 2016-08-14 ENCOUNTER — Other Ambulatory Visit: Payer: Self-pay | Admitting: Internal Medicine

## 2016-08-14 MED ORDER — ALPRAZOLAM 1 MG PO TABS: ORAL_TABLET | ORAL | 0 refills | Status: DC

## 2016-08-17 ENCOUNTER — Other Ambulatory Visit: Payer: Self-pay | Admitting: Internal Medicine

## 2016-08-27 ENCOUNTER — Encounter: Payer: Self-pay | Admitting: Internal Medicine

## 2016-08-27 ENCOUNTER — Ambulatory Visit (INDEPENDENT_AMBULATORY_CARE_PROVIDER_SITE_OTHER): Payer: 59 | Admitting: Internal Medicine

## 2016-08-27 ENCOUNTER — Other Ambulatory Visit: Payer: Self-pay

## 2016-08-27 VITALS — BP 134/82 | HR 61 | Temp 98.6°F | Resp 16 | Ht 63.5 in | Wt 208.6 lb

## 2016-08-27 DIAGNOSIS — E782 Mixed hyperlipidemia: Secondary | ICD-10-CM | POA: Diagnosis not present

## 2016-08-27 DIAGNOSIS — N182 Chronic kidney disease, stage 2 (mild): Secondary | ICD-10-CM

## 2016-08-27 DIAGNOSIS — Z79899 Other long term (current) drug therapy: Secondary | ICD-10-CM

## 2016-08-27 DIAGNOSIS — E1122 Type 2 diabetes mellitus with diabetic chronic kidney disease: Secondary | ICD-10-CM | POA: Diagnosis not present

## 2016-08-27 DIAGNOSIS — E559 Vitamin D deficiency, unspecified: Secondary | ICD-10-CM | POA: Diagnosis not present

## 2016-08-27 DIAGNOSIS — I10 Essential (primary) hypertension: Secondary | ICD-10-CM

## 2016-08-27 MED ORDER — BUPROPION HCL ER (XL) 150 MG PO TB24: 150.0000 mg | ORAL_TABLET | ORAL | 2 refills | Status: DC

## 2016-08-27 NOTE — Progress Notes (Signed)
This very nice 66 y.o. MWF presents for 6 month follow up with Hypertension, Hyperlipidemia, T2_NIDDMand Vitamin D Deficiency. Patient had a Rt lumpectomy in Dec 2016.      Patient is treated for HTN (1995) & BP has been controlled at home. Today's BP is at goal - 134/82. Patient has had no complaints of any cardiac type chest pain, palpitations, dyspnea/orthopnea/PND, dizziness, claudication, or dependent edema.     Hyperlipidemia is controlled with diet & meds. Patient denies myalgias or other med SE's. Last Lipids were  Lab Results  Component Value Date   CHOL 215 (H) 06/02/2016   HDL 77 06/02/2016   LDLCALC 117 (H) 06/02/2016   TRIG 105 06/02/2016   CHOLHDL 2.8 06/02/2016      Also, the patient has history of Morbid Obesity (BMI 36+) and  T2_NIDDM (2012) and has had no symptoms of reactive hypoglycemia, diabetic polys, paresthesias or visual blurring. In 2008, she under went Gastric Lap Band bariatric surg with initial weight loss and tragically regaining allof her lost weight with bonus.  Last A1c was near goal:  Lab Results  Component Value Date   HGBA1C 5.7 (H) 06/02/2016      Further, the patient also has history of Vitamin D Deficiency of "28" in 2008 and supplements vitamin D without any suspected side-effects. Last vitamin D was at goal"  Lab Results  Component Value Date   VD25OH 74 06/02/2016   Current Outpatient Prescriptions on File Prior to Visit  Medication Sig  . acidophilus (RISAQUAD) CAPS Take 1 capsule by mouth daily. Reported on 02/19/2015  . ALPRAZolam (XANAX) 1 MG tablet TAKE 1/2 TO 1 TABLET 2 TO 3 TIMES A DAY ONLY AS NEEDED FOR SEVERE ANXIETY  . anastrozole (ARIMIDEX) 1 MG tablet Take 1 tablet (1 mg total) by mouth daily.  Marland Kitchen aspirin 81 MG tablet Take 81 mg by mouth daily.  Marland Kitchen atenolol (TENORMIN) 100 MG tablet TAKE 1 TABLET EVERY DAY  . b complex vitamins capsule Take 1 capsule by mouth daily.  . calcium carbonate (TUMS) 500 MG chewable tablet Chew 1,000 mg  by mouth at bedtime.   . Cholecalciferol (VITAMIN D3) 5000 UNITS CAPS Take 5,000 Units by mouth daily.  . Cinnamon 500 MG TABS Take 1 tablet by mouth 2 (two) times daily. Cinnamon 1000 mg, 1 tab in morning and 1 tab evening  . fenofibrate micronized (ANTARA) 130 MG capsule Take 1 capsule (130 mg total) by mouth daily before breakfast.  . Magnesium 250 MG TABS Take 250 mg by mouth 2 (two) times daily.  . Multiple Vitamins-Minerals (MULTIVITAMIN WITH MINERALS) tablet Take 1 tablet by mouth 2 (two) times daily.  . Omega-3 Fatty Acids (FISH OIL) 1000 MG CAPS Take 1,000 mg by mouth daily.  Marland Kitchen OVER THE COUNTER MEDICATION Take 1 tablet by mouth 2 (two) times daily. Tumeric with black pepper  . PROCTOSOL HC 2.5 % rectal cream APPLY 1 APPLICATORFUL RECTALLY 4 TIMES DAILY AS DIRECTED  . verapamil (CALAN) 80 MG tablet TAKE 1 TABLET BY MOUTH TWICE A DAY FOR BLOOD PRESSURE   No current facility-administered medications on file prior to visit.    Allergies  Allergen Reactions  . Codeine Itching  . Lipitor [Atorvastatin] Other (See Comments)    Severe fatigue. ? memory loss.  . Ace Inhibitors     cough   PMHx:   Past Medical History:  Diagnosis Date  . Allergy    SEASONAL  . Anxiety   .  At risk for difficult insertion of breathing tube   . Breast cancer ([place])   . Breast cancer of upper-outer quadrant of right female breast ([place]) 12/03/2014  . 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 GERD (gastroesophageal reflux disease)    hx - resolved with lap band surgery  . H/O hiatal hernia    resolved with band surgery  . Hyperlipidemia   . Hypertension   . Insomnia    tx with zanax  . 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 Type II or unspecified type diabetes mellitus without mention of complication, not stated as uncontrolled 12/14/2012   no meds, diet controlled   Immunization History  Administered Date(s) Administered  . DT 12/18/2013  . DTaP 01/12/2002  . Influenza Split  12/14/2012, 11/19/2013, 10/27/2014  . Influenza-Unspecified 10/22/2015  . PPD Test 12/14/2012, 12/18/2013, 01/24/2015, 02/20/2016  . Pneumococcal Conjugate-13 10/09/2013  . Pneumococcal Polysaccharide-23 01/13/1992   Past Surgical History:  Procedure Laterality Date  . APPENDECTOMY  08/06/12   lap appy  . BACK SURGERY     cervical  . BREAST LUMPECTOMY WITH NEEDLE LOCALIZATION AND AXILLARY SENTINEL LYMPH NODE BX Right 12/28/2014   Procedure: RIGHT BREAST LUMPECTOMY WITH TWO (2) NEEDLE LOCALIZATION AND AXILLARY SENTINEL LYMPH NODE BX;  Surgeon: Alphonsa Overall, MD;  Location: [facility];  Service: General;  Laterality: Right;  . CESAREAN SECTION     x 1  . CHOLECYSTECTOMY    . COLONOSCOPY  09/29/2004   normal  . DIAGNOSTIC LAPAROSCOPY    . HERNIA REPAIR  2008  . LAPAROSCOPIC APPENDECTOMY N/A 08/06/2012   Procedure: APPENDECTOMY LAPAROSCOPIC;  Surgeon: Earnstine Regal, MD;  Location: [facility];  Service: General;  Laterality: N/A;  . LAPAROSCOPIC GASTRIC BANDING WITH HIATAL HERNIA REPAIR N/A Nov 2008  . TUBAL LIGATION     FHx:    Reviewed / unchanged  SHx:    Reviewed / unchanged  Systems Review:  Constitutional: Denies fever, chills, wt changes, headaches, insomnia, fatigue, night sweats, change in appetite. Eyes: Denies redness, blurred vision, diplopia, discharge, itchy, watery eyes.  ENT: Denies discharge, congestion, post nasal drip, epistaxis, sore throat, earache, hearing loss, dental pain, tinnitus, vertigo, sinus pain, snoring.  CV: Denies chest pain, palpitations, irregular heartbeat, syncope, dyspnea, diaphoresis, orthopnea, PND, claudication or edema. Respiratory: denies cough, dyspnea, DOE, pleurisy, hoarseness, laryngitis, wheezing.  Gastrointestinal: Denies dysphagia, odynophagia, heartburn, reflux, water brash, abdominal pain or cramps, nausea, vomiting, bloating, diarrhea, constipation, hematemesis, melena, hematochezia  or hemorrhoids. Genitourinary: Denies  dysuria, frequency, urgency, nocturia, hesitancy, discharge, hematuria or flank pain. Musculoskeletal: Denies arthralgias, myalgias, stiffness, jt. swelling, pain, limping or strain/sprain.  Skin: Denies pruritus, rash, hives, warts, acne, eczema or change in skin lesion(s). Neuro: No weakness, tremor, incoordination, spasms, paresthesia or pain. Psychiatric: Denies confusion, memory loss or sensory loss. Endo: Denies change in weight, skin or hair change.  Heme/Lymph: No excessive bleeding, bruising or enlarged lymph nodes.  Physical Exam  BP 134/82   Pulse 61   Temp 98.6 F (37 C)   Resp 16   Ht 5' 3.5" (1.613 m)   Wt 208 lb 9.6 oz (94.6 kg)   SpO2 96%   BMI 36.37 kg/m   Appears well nourished, well groomed  and in no distress.  Eyes: PERRLA, EOMs, conjunctiva no swelling or erythema. Sinuses: No frontal/maxillary tenderness ENT/Mouth: EAC's clear, TM's nl w/o erythema, bulging. Nares clear w/o erythema, swelling, exudates. Oropharynx clear without erythema or exudates. Oral hygiene is  good. Tongue normal, non obstructing. Hearing intact.  Neck: Supple. Thyroid nl. Car 2+/2+ without bruits, nodes or JVD. Chest: Respirations nl with BS clear & equal w/o rales, rhonchi, wheezing or stridor.  Cor: Heart sounds normal w/ regular rate and rhythm without sig. murmurs, gallops, clicks or rubs. Peripheral pulses normal and equal  without edema.  Abdomen: Soft & bowel sounds normal. Non-tender w/o guarding, rebound, hernias, masses or organomegaly.  Lymphatics: Unremarkable.  Musculoskeletal: Full ROM all peripheral extremities, joint stability, 5/5 strength and normal gait.  Skin: Warm, dry without exposed rashes, lesions or ecchymosis apparent.  Neuro: Cranial nerves intact, reflexes equal bilaterally. Sensory-motor testing grossly intact. Tendon reflexes grossly intact.  Pysch: Alert & oriented x 3.  Insight and judgement nl & appropriate. No ideations.  Assessment and Plan:  1.  Essential hypertension  - Continue medication, monitor blood pressure at home.  - Continue DASH diet. Reminder to go to the ER if any CP,  SOB, nausea, dizziness, severe HA, changes vision/speech.  - CBC with Differential/Platelet - BASIC METABOLIC PANEL WITH GFR - Magnesium - TSH  2. Hyperlipidemia, mixed  - Continue diet/meds, exercise,& lifestyle modifications.  - Continue monitor periodic cholesterol/liver & renal functions   - Hepatic function panel - Lipid panel - TSH  3. Type 2 diabetes mellitus with stage 2 chronic kidney disease, without long-term current use of insulin (HCC)  - Continue diet, exercise, lifestyle modifications.  - Monitor appropriate labs.  - Hemoglobin A1c - Insulin, random  4. Vitamin D deficiency  - Continue supplementation.  - VITAMIN D 25 Hydroxy   5. Medication management  - CBC with Differential/Platelet - BASIC METABOLIC PANEL WITH GFR - Hepatic function panel - Magnesium - Lipid panel - TSH - Hemoglobin A1c - Insulin, random - VITAMIN D 25 Hydroxy          Discussed  regular exercise, BP monitoring, weight control to achieve/maintain BMI less than 25 and discussed med and SE's. Recommended labs to assess and monitor clinical status with further disposition pending results of labs. Over 30 minutes of exam, counseling, chart review was performed.

## 2016-08-27 NOTE — Patient Instructions (Signed)

## 2016-08-28 ENCOUNTER — Other Ambulatory Visit: Payer: Self-pay | Admitting: Internal Medicine

## 2016-08-28 DIAGNOSIS — E782 Mixed hyperlipidemia: Secondary | ICD-10-CM

## 2016-08-28 LAB — CBC WITH DIFFERENTIAL/PLATELET
Basophils Absolute: 49 cells/uL (ref 0–200)
Basophils Relative: 0.7 %
Eosinophils Absolute: 133 cells/uL (ref 15–500)
Eosinophils Relative: 1.9 %
HCT: 36.9 % (ref 35.0–45.0)
Hemoglobin: 12.4 g/dL (ref 11.7–15.5)
Lymphs Abs: 2135 cells/uL (ref 850–3900)
MCH: 30.5 pg (ref 27.0–33.0)
MCHC: 33.6 g/dL (ref 32.0–36.0)
MCV: 90.9 fL (ref 80.0–100.0)
MPV: 9.2 fL (ref 7.5–12.5)
Monocytes Relative: 8.2 %
Neutro Abs: 4109 cells/uL (ref 1500–7800)
Neutrophils Relative %: 58.7 %
PLATELETS: 406 10*3/uL — AB (ref 140–400)
RBC: 4.06 10*6/uL (ref 3.80–5.10)
RDW: 12.1 % (ref 11.0–15.0)
TOTAL LYMPHOCYTE: 30.5 %
WBC: 7 10*3/uL (ref 3.8–10.8)
WBCMIX: 574 {cells}/uL (ref 200–950)

## 2016-08-28 LAB — BASIC METABOLIC PANEL WITH GFR
BUN / CREAT RATIO: 15 (calc) (ref 6–22)
BUN: 18 mg/dL (ref 7–25)
CHLORIDE: 104 mmol/L (ref 98–110)
CO2: 27 mmol/L (ref 20–32)
Calcium: 9.4 mg/dL (ref 8.6–10.4)
Creat: 1.21 mg/dL — ABNORMAL HIGH (ref 0.50–0.99)
GFR, Est African American: 55 mL/min/{1.73_m2} — ABNORMAL LOW (ref >=60)
GFR, Est Non African American: 47 mL/min/{1.73_m2} — ABNORMAL LOW (ref >=60)
GLUCOSE: 99 mg/dL (ref 65–99)
Potassium: 3.9 mmol/L (ref 3.5–5.3)
SODIUM: 139 mmol/L (ref 135–146)

## 2016-08-28 LAB — LIPID PANEL
CHOL/HDL RATIO: 3 (calc) (ref <5.0)
Cholesterol: 204 mg/dL — ABNORMAL HIGH (ref <200)
HDL: 68 mg/dL (ref >50)
LDL Cholesterol (Calc): 113 mg/dL (calc) — ABNORMAL HIGH
Non-HDL Cholesterol (Calc): 136 mg/dL (calc) — ABNORMAL HIGH (ref <130)
Triglycerides: 119 mg/dL (ref <150)

## 2016-08-28 LAB — HEPATIC FUNCTION PANEL
AG RATIO: 1.9 (calc) (ref 1.0–2.5)
ALKALINE PHOSPHATASE (APISO): 41 U/L (ref 33–130)
ALT: 19 U/L (ref 6–29)
AST: 16 U/L (ref 10–35)
Albumin: 4.1 g/dL (ref 3.6–5.1)
BILIRUBIN DIRECT: 0.1 mg/dL (ref 0.0–0.2)
BILIRUBIN INDIRECT: 0.2 mg/dL (ref 0.2–1.2)
BILIRUBIN TOTAL: 0.3 mg/dL (ref 0.2–1.2)
Globulin: 2.2 g/dL (calc) (ref 1.9–3.7)
Total Protein: 6.3 g/dL (ref 6.1–8.1)

## 2016-08-28 LAB — INSULIN, FASTING: Insulin: 15.6 u[IU]/mL (ref 2.0–19.6)

## 2016-08-28 LAB — VITAMIN D 25 HYDROXY (VIT D DEFICIENCY, FRACTURES): Vit D, 25-Hydroxy: 82 ng/mL (ref 30–100)

## 2016-08-28 LAB — TSH: TSH: 3.27 mIU/L (ref 0.40–4.50)

## 2016-08-28 LAB — HEMOGLOBIN A1C
HEMOGLOBIN A1C: 5.8 %{Hb} — AB (ref <5.7)
MEAN PLASMA GLUCOSE: 120 (calc)
eAG (mmol/L): 6.6 (calc)

## 2016-08-28 LAB — MAGNESIUM: Magnesium: 1.9 mg/dL (ref 1.5–2.5)

## 2016-08-28 MED ORDER — EZETIMIBE 10 MG PO TABS: ORAL_TABLET | ORAL | 1 refills | Status: DC

## 2016-08-29 ENCOUNTER — Encounter: Payer: Self-pay | Admitting: Internal Medicine

## 2016-09-17 DIAGNOSIS — M7662 Achilles tendinitis, left leg: Secondary | ICD-10-CM | POA: Diagnosis not present

## 2016-09-17 DIAGNOSIS — M71572 Other bursitis, not elsewhere classified, left ankle and foot: Secondary | ICD-10-CM | POA: Diagnosis not present

## 2016-09-17 DIAGNOSIS — S92015A Nondisplaced fracture of body of left calcaneus, initial encounter for closed fracture: Secondary | ICD-10-CM | POA: Diagnosis not present

## 2016-09-24 ENCOUNTER — Encounter: Payer: Self-pay | Admitting: Oncology

## 2016-09-24 DIAGNOSIS — S92015D Nondisplaced fracture of body of left calcaneus, subsequent encounter for fracture with routine healing: Secondary | ICD-10-CM | POA: Diagnosis not present

## 2016-09-24 DIAGNOSIS — M7662 Achilles tendinitis, left leg: Secondary | ICD-10-CM | POA: Diagnosis not present

## 2016-10-07 ENCOUNTER — Other Ambulatory Visit: Payer: Self-pay | Admitting: Oncology

## 2016-10-07 DIAGNOSIS — Z853 Personal history of malignant neoplasm of breast: Secondary | ICD-10-CM

## 2016-10-08 DIAGNOSIS — S92015D Nondisplaced fracture of body of left calcaneus, subsequent encounter for fracture with routine healing: Secondary | ICD-10-CM | POA: Diagnosis not present

## 2016-10-08 DIAGNOSIS — M7662 Achilles tendinitis, left leg: Secondary | ICD-10-CM | POA: Diagnosis not present

## 2016-10-19 DIAGNOSIS — S92015D Nondisplaced fracture of body of left calcaneus, subsequent encounter for fracture with routine healing: Secondary | ICD-10-CM | POA: Diagnosis not present

## 2016-10-25 DIAGNOSIS — Z23 Encounter for immunization: Secondary | ICD-10-CM | POA: Diagnosis not present

## 2016-10-26 DIAGNOSIS — M7662 Achilles tendinitis, left leg: Secondary | ICD-10-CM | POA: Diagnosis not present

## 2016-10-26 DIAGNOSIS — S92015D Nondisplaced fracture of body of left calcaneus, subsequent encounter for fracture with routine healing: Secondary | ICD-10-CM | POA: Diagnosis not present

## 2016-11-09 DIAGNOSIS — S92015D Nondisplaced fracture of body of left calcaneus, subsequent encounter for fracture with routine healing: Secondary | ICD-10-CM | POA: Diagnosis not present

## 2016-11-09 DIAGNOSIS — M7662 Achilles tendinitis, left leg: Secondary | ICD-10-CM | POA: Diagnosis not present

## 2016-11-19 ENCOUNTER — Ambulatory Visit
Admission: RE | Admit: 2016-11-19 | Discharge: 2016-11-19 | Disposition: A | Discharge status: Home / Self Care | Payer: Medicare Other | Source: Ambulatory Visit | Attending: Oncology | Admitting: Oncology

## 2016-11-19 DIAGNOSIS — Z853 Personal history of malignant neoplasm of breast: Secondary | ICD-10-CM

## 2016-11-19 DIAGNOSIS — R928 Other abnormal and inconclusive findings on diagnostic imaging of breast: Secondary | ICD-10-CM | POA: Diagnosis not present

## 2016-11-30 DIAGNOSIS — N183 Chronic kidney disease, stage 3 unspecified: Secondary | ICD-10-CM | POA: Insufficient documentation

## 2016-11-30 DIAGNOSIS — R7303 Prediabetes: Secondary | ICD-10-CM | POA: Insufficient documentation

## 2016-11-30 NOTE — Progress Notes (Signed)
FOLLOW UP  Assessment and Plan:   Hypertension Well controlled with current medications  Monitor blood pressure at home; patient to call if consistently greater than 130/80 Continue DASH diet.   Reminder to go to the ER if any CP, SOB, nausea, dizziness, severe HA, changes vision/speech, left arm numbness and tingling and jaw pain.  Cholesterol Currently taking zetia 10 mg daily - has prescription for crestor which she was unsure if she was supposed to take. May switch to crestor pending lab results if elevated.  Continue low cholesterol diet and exercise.  Check lipid panel.   Prediabetes Emphasized high quality sources of carbohydrates in diet and exercise.  Perform daily foot/skin check, notify office of any concerning changes.  Check A1C  Obesity with co morbidities Long discussion about weight loss, diet, and exercise Discussed ideal weight for height and weight goal for next visit- not gain, maintain over holidays Patient will work on discussing healthier options with husband, portion control  Will follow up in 3 months  Vitamin D Def/ osteoporosis prevention Continue supplementation Check Vit D level  CKD stage 3 Increase fluids, avoid NSAIDS, monitor sugars, will monitor  Continue diet and meds as discussed. Further disposition pending results of labs. Discussed med's effects and SE's.   Over 30 minutes of exam, counseling, chart review, and critical decision making was performed.   Future Appointments  Date Time Provider [place]  12/08/2016  3:00 PM [facility] 5 [facility] None  12/08/2016  3:30 PM Magrinat, Virgie Dad, MD [facility] None  03/31/2017  3:00 PM Unk Pinto, MD [facility] None    ----------------------------------------------------------------------------------------------------------------------  HPI 66 y.o. female  presents for 3 month follow up on hypertension, cholesterol, prediabetes, stage 3 CKD, morbid obesity (s/p lab  band 2008) and vitamin D deficiency. She is reportedly recovering from a L calcaneus fracture for which she wore a boot and has been recommended to avoid high-impact activity. She reports she is doing well.   BMI is Body mass index is 37.38 kg/m., she has not working on diet and exercise- she reports she is a stress eater- has been stressed about her job and is trying to retire. She eats salads and protein shakes during the day, but tends to eat too much for dinner - husband has been cooking due to long/stressful days and tends to not cook healthy items  Wt Readings from Last 3 Encounters:  12/01/16 214 lb 6.4 oz (97.3 kg)  08/27/16 208 lb 9.6 oz (94.6 kg)  06/09/16 208 lb 9.6 oz (94.6 kg)   Her blood pressure has been controlled at home, today their BP is BP: 136/78  She does not workout. She denies chest pain, shortness of breath, dizziness.   She is on cholesterol medication and denies myalgias. Her cholesterol is not at goal. The cholesterol last visit was:   Lab Results  Component Value Date   CHOL 204 (H) 08/27/2016   HDL 68 08/27/2016   LDLCALC 117 (H) 06/02/2016   TRIG 119 08/27/2016   CHOLHDL 3.0 08/27/2016    She has not been working on diet and exercise for prediabetes, and denies increased appetite, nausea, paresthesia of the feet, polydipsia, polyuria, visual disturbances and vomiting. Last A1C in the office was:  Lab Results  Component Value Date   HGBA1C 5.8 (H) 08/27/2016   Patient is on Vitamin D supplement and at goal:  Lab Results  Component Value Date   VD25OH 82 08/27/2016        Current  Medications:  Current Outpatient Medications on File Prior to Visit  Medication Sig  . acidophilus (RISAQUAD) CAPS Take 1 capsule by mouth daily. Reported on 02/19/2015  . ALPRAZolam (XANAX) 1 MG tablet TAKE 1/2 TO 1 TABLET 2 TO 3 TIMES A DAY ONLY AS NEEDED FOR SEVERE ANXIETY  . anastrozole (ARIMIDEX) 1 MG tablet Take 1 tablet (1 mg total) by mouth daily.  Marland Kitchen aspirin 81 MG  tablet Take 81 mg by mouth daily.  Marland Kitchen atenolol (TENORMIN) 100 MG tablet TAKE 1 TABLET EVERY DAY  . b complex vitamins capsule Take 1 capsule by mouth daily.  . calcium carbonate (TUMS) 500 MG chewable tablet Chew 1,000 mg by mouth at bedtime.   . Cholecalciferol (VITAMIN D3) 5000 UNITS CAPS Take 5,000 Units by mouth daily.  . Cinnamon 500 MG TABS Take 1 tablet by mouth 2 (two) times daily. Cinnamon 1000 mg, 1 tab in morning and 1 tab evening  . ezetimibe (ZETIA) 10 MG tablet Take 1 tablet daily for Cholesterol  . Magnesium 250 MG TABS Take 250 mg by mouth 2 (two) times daily.  . Multiple Vitamins-Minerals (MULTIVITAMIN WITH MINERALS) tablet Take 1 tablet by mouth 2 (two) times daily.  . Omega-3 Fatty Acids (FISH OIL) 1000 MG CAPS Take 1,000 mg by mouth daily.  Marland Kitchen OVER THE COUNTER MEDICATION Take 1 tablet by mouth 2 (two) times daily. Tumeric with black pepper  . verapamil (CALAN) 80 MG tablet TAKE 1 TABLET BY MOUTH TWICE A DAY FOR BLOOD PRESSURE  . buPROPion (WELLBUTRIN XL) 150 MG 24 hr tablet Take 1 tablet (150 mg total) by mouth every morning. (Patient not taking: Reported on 12/01/2016)  . fenofibrate micronized (ANTARA) 130 MG capsule Take 1 capsule (130 mg total) by mouth daily before breakfast. (Patient not taking: Reported on 12/01/2016)  . PROCTOSOL HC 2.5 % rectal cream APPLY 1 APPLICATORFUL RECTALLY 4 TIMES DAILY AS DIRECTED (Patient not taking: Reported on 12/01/2016)   No current facility-administered medications on file prior to visit.      Allergies:  Allergies  Allergen Reactions  . Codeine Itching  . Lipitor [Atorvastatin] Other (See Comments)    Severe fatigue. ? memory loss.  . Ace Inhibitors     cough     Medical History:  Past Medical History:  Diagnosis Date  . Allergy    SEASONAL  . Anxiety   . At risk for difficult insertion of breathing tube   . Breast cancer ([place])   . Breast cancer of upper-outer quadrant of right female breast ([place]) 12/03/2014  .  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 GERD (gastroesophageal reflux disease)    hx - resolved with lap band surgery  . H/O hiatal hernia    resolved with band surgery  . Hyperlipidemia   . Hypertension   . Insomnia    tx with zanax  . 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 Type II or unspecified type diabetes mellitus without mention of complication, not stated as uncontrolled 12/14/2012   no meds, diet controlled   Family history- Reviewed and unchanged Social history- Reviewed and unchanged   Review of Systems:  Review of Systems  Constitutional: Negative for malaise/fatigue and weight loss.  HENT: Negative for hearing loss and tinnitus.   Eyes: Negative for blurred vision and double vision.  Respiratory: Negative for cough, shortness of breath and wheezing.   Cardiovascular: Negative for chest pain, palpitations, orthopnea, claudication and leg swelling.  Gastrointestinal: Negative for abdominal  pain, blood in stool, constipation, diarrhea, heartburn, melena, nausea and vomiting.  Genitourinary: Negative.   Musculoskeletal: Negative for joint pain and myalgias.  Skin: Negative for rash.  Neurological: Negative for dizziness, tingling, sensory change, weakness and headaches.  Endo/Heme/Allergies: Negative for polydipsia.  Psychiatric/Behavioral: Negative.   All other systems reviewed and are negative.     Physical Exam: BP 136/78   Pulse 96   Temp 97.9 F (36.6 C)   Ht 5' 3.5" (1.613 m)   Wt 214 lb 6.4 oz (97.3 kg)   SpO2 96%   BMI 37.38 kg/m  Wt Readings from Last 3 Encounters:  12/01/16 214 lb 6.4 oz (97.3 kg)  08/27/16 208 lb 9.6 oz (94.6 kg)  06/09/16 208 lb 9.6 oz (94.6 kg)   General Appearance: Well nourished, in no apparent distress. Eyes: PERRLA, EOMs, conjunctiva no swelling or erythema Sinuses: No Frontal/maxillary tenderness ENT/Mouth: Ext aud canals clear, TMs without erythema, bulging. No erythema, swelling, or exudate on post  pharynx.  Tonsils not swollen or erythematous. Hearing normal.  Neck: Supple, thyroid normal.  Respiratory: Respiratory effort normal, BS equal bilaterally without rales, rhonchi, wheezing or stridor.  Cardio: RRR with no MRGs. Brisk peripheral pulses without edema.  Abdomen: Soft, + BS.  Non tender, no guarding, rebound, hernias, masses. Lymphatics: Non tender without lymphadenopathy.  Musculoskeletal: Full ROM, 5/5 strength, Normal gait Skin: Warm, dry without rashes, lesions, ecchymosis.  Neuro: Cranial nerves intact. No cerebellar symptoms.  Psych: Awake and oriented X 3, normal affect, Insight and Judgment appropriate.    Izora Ribas, NP 3:46 PM [facility]

## 2016-12-01 ENCOUNTER — Ambulatory Visit (INDEPENDENT_AMBULATORY_CARE_PROVIDER_SITE_OTHER): Payer: Medicare Other | Admitting: Adult Health

## 2016-12-01 ENCOUNTER — Encounter: Payer: Self-pay | Admitting: Adult Health

## 2016-12-01 VITALS — BP 136/78 | HR 96 | Temp 97.9°F | Ht 63.5 in | Wt 214.4 lb

## 2016-12-01 DIAGNOSIS — R7303 Prediabetes: Secondary | ICD-10-CM

## 2016-12-01 DIAGNOSIS — F419 Anxiety disorder, unspecified: Secondary | ICD-10-CM | POA: Insufficient documentation

## 2016-12-01 DIAGNOSIS — N183 Chronic kidney disease, stage 3 unspecified: Secondary | ICD-10-CM

## 2016-12-01 DIAGNOSIS — Z79899 Other long term (current) drug therapy: Secondary | ICD-10-CM

## 2016-12-01 DIAGNOSIS — I10 Essential (primary) hypertension: Secondary | ICD-10-CM

## 2016-12-01 DIAGNOSIS — E559 Vitamin D deficiency, unspecified: Secondary | ICD-10-CM

## 2016-12-01 DIAGNOSIS — E782 Mixed hyperlipidemia: Secondary | ICD-10-CM

## 2016-12-01 NOTE — Patient Instructions (Signed)
Ways to cut 100 calories  1. Eat your eggs with hot sauce OR salsa instead of cheese.  Eggs are great for breakfast, but many people consider eggs and cheese to be BFFs. Instead of cheese-1 oz. of cheddar has 114 calories-top your eggs with hot sauce, which contains no calories and helps with satiety and metabolism. Salsa is also a great option!!  2. Top your toast, waffles or pancakes with mashed berries instead of jelly or syrup. Half a cup of berries-fresh, frozen or thawed-has about 40 calories, compared with 2 tbsp. of maple syrup or jelly, which both have about 100 calories. The berries will also give you a good punch of fiber, which helps keep you full and satisfied and won't spike blood sugar quickly like the jelly or syrup. 3. Swap the non-fat latte for black coffee with a splash of half-and-half. Contrary to its name, that non-fat latte has 130 calories and a startling 19g of carbohydrates per 16 oz. serving. Replacing that 'light' drinkable dessert with a black coffee with a splash of half-and-half saves you more than 100 calories per 16 oz. serving. 4. Sprinkle salads with freeze-dried raspberries instead of dried cranberries. If you want a sweet addition to your nutritious salad, stay away from dried cranberries. They have a whopping 130 calories per  cup and 30g carbohydrates. Instead, sprinkle freeze-dried raspberries guilt-free and save more than 100 calories per  cup serving, adding 3g of belly-filling fiber. 5. Go for mustard in place of mayo on your sandwich. Mustard can add really nice flavor to any sandwich, and there are tons of varieties, from spicy to honey. A serving of mayo is 95 calories, versus 10 calories in a serving of mustard. 6. Choose a DIY salad dressing instead of the store-bought kind. Mix Dijon or whole grain mustard with low-fat Kefir or red wine vinegar and garlic. 7. Use hummus as a spread instead of a dip. Use hummus as a spread on a high-fiber cracker or  tortilla with a sandwich and save on calories without sacrificing taste. 8. Pick just one salad "accessory." Salad isn't automatically a calorie winner. It's easy to over-accessorize with toppings. Instead of topping your salad with nuts, avocado and cranberries (all three will clock in at 313 calories), just pick one. The next day, choose a different accessory, which will also keep your salad interesting. You don't wear all your jewelry every day, right? 9. Ditch the white pasta in favor of spaghetti squash. One cup of cooked spaghetti squash has about 40 calories, compared with traditional spaghetti, which comes with more than 200. Spaghetti squash is also nutrient-dense. It's a good source of fiber and Vitamins A and C, and it can be eaten just like you would eat pasta-with a great tomato sauce and [place] meatballs or with pesto, tofu and spinach, for example. 10. Dress up your chili, soups and stews with non-fat [place] yogurt instead of sour cream. Just a 'dollop' of sour cream can set you back 115 calories and a whopping 12g of fat-seven of which are of the artery-clogging variety. Added bonus: [place] yogurt is packed with muscle-building protein, calcium and B Vitamins. 11. Mash cauliflower instead of mashed potatoes. One cup of traditional mashed potatoes-in all their creamy goodness-has more than 200 calories, compared to mashed cauliflower, which you can typically eat for less than 100 calories per 1 cup serving. Cauliflower is a great source of the antioxidant indole-3-carbinol (I3C), which may help reduce the risk of some cancers, like breast  cancer. 12. Ditch the ice cream sundae in favor of a Greek yogurt parfait. Instead of a cup of ice cream or fro-yo for dessert, try 1 cup of nonfat Greek yogurt topped with fresh berries and a sprinkle of cacao nibs. Both toppings are packed with antioxidants, which can help reduce cellular inflammation and oxidative damage. And the comparison is a  no-brainer: One cup of ice cream has about 275 calories; one cup of frozen yogurt has about 230; and a cup of Greek yogurt has just 130, plus twice the protein, so you're less likely to return to the freezer for a second helping. 13. Put olive oil in a spray container instead of using it directly from the bottle. Each tablespoon of olive oil is 120 calories and 15g of fat. Use a mister instead of pouring it straight into the pan or onto a salad. This allows for portion control and will save you more than 100 calories. 14. When baking, substitute canned pumpkin for butter or oil. Canned pumpkin-not pumpkin pie mix-is loaded with Vitamin A, which is important for skin and eye health, as well as immunity. And the comparisons are pretty crazy:  cup of canned pumpkin has about 40 calories, compared to butter or oil, which has more than 800 calories. Yes, 800 calories. Applesauce and mashed banana can also serve as good substitutions for butter or oil, usually in a 1:1 ratio. 15. Top casseroles with high-fiber cereal instead of breadcrumbs. Breadcrumbs are typically made with white bread, while breakfast cereals contain 5-9g of fiber per serving. Not only will you save more than 150 calories per  cup serving, the swap will also keep you more full and you'll get a metabolism boost from the added fiber. 16. Snack on pistachios instead of macadamia nuts. Believe it or not, you get the same amount of calories from 35 pistachios (100 calories) as you would from only five macadamia nuts. 17. Chow down on kale chips rather than potato chips. This is my favorite 'don't knock it 'till you try it' swap. Kale chips are so easy to make at home, and you can spice them up with a little grated parmesan or chili powder. Plus, they're a mere fraction of the calories of potato chips, but with the same crunch factor we crave so often. 18. Add seltzer and some fruit slices to your cocktail instead of soda or fruit juice. One  cup of soda or fruit juice can pack on as much as 140 calories. Instead, use seltzer and fruit slices. The fruit provides valuable phytochemicals, such as flavonoids and anthocyanins, which help to combat cancer and stave off the aging process.  

## 2016-12-02 LAB — CBC WITH DIFFERENTIAL/PLATELET
BASOS PCT: 0.9 %
Basophils Absolute: 62 cells/uL (ref 0–200)
EOS ABS: 110 {cells}/uL (ref 15–500)
Eosinophils Relative: 1.6 %
HCT: 37.7 % (ref 35.0–45.0)
HEMOGLOBIN: 13.1 g/dL (ref 11.7–15.5)
Lymphs Abs: 2098 cells/uL (ref 850–3900)
MCH: 32 pg (ref 27.0–33.0)
MCHC: 34.7 g/dL (ref 32.0–36.0)
MCV: 92 fL (ref 80.0–100.0)
MPV: 9.2 fL (ref 7.5–12.5)
Monocytes Relative: 9.1 %
NEUTROS ABS: 4002 {cells}/uL (ref 1500–7800)
Neutrophils Relative %: 58 %
Platelets: 364 10*3/uL (ref 140–400)
RBC: 4.1 10*6/uL (ref 3.80–5.10)
RDW: 12.2 % (ref 11.0–15.0)
Total Lymphocyte: 30.4 %
WBC: 6.9 10*3/uL (ref 3.8–10.8)
WBCMIX: 628 {cells}/uL (ref 200–950)

## 2016-12-02 LAB — BASIC METABOLIC PANEL WITH GFR
BUN: 20 mg/dL (ref 7–25)
CO2: 31 mmol/L (ref 20–32)
CREATININE: 0.88 mg/dL (ref 0.50–0.99)
Calcium: 9.3 mg/dL (ref 8.6–10.4)
Chloride: 104 mmol/L (ref 98–110)
GFR, EST AFRICAN AMERICAN: 80 mL/min/{1.73_m2} (ref >=60)
GFR, EST NON AFRICAN AMERICAN: 69 mL/min/{1.73_m2} (ref >=60)
Glucose, Bld: 104 mg/dL — ABNORMAL HIGH (ref 65–99)
Potassium: 4.4 mmol/L (ref 3.5–5.3)
Sodium: 140 mmol/L (ref 135–146)

## 2016-12-02 LAB — LIPID PANEL
Cholesterol: 203 mg/dL — ABNORMAL HIGH (ref <200)
HDL: 61 mg/dL (ref >50)
LDL Cholesterol (Calc): 110 mg/dL (calc) — ABNORMAL HIGH
NON-HDL CHOLESTEROL (CALC): 142 mg/dL — AB (ref <130)
TRIGLYCERIDES: 205 mg/dL — AB (ref <150)
Total CHOL/HDL Ratio: 3.3 (calc) (ref <5.0)

## 2016-12-02 LAB — HEPATIC FUNCTION PANEL
AG RATIO: 1.6 (calc) (ref 1.0–2.5)
ALT: 19 U/L (ref 6–29)
AST: 17 U/L (ref 10–35)
Albumin: 3.9 g/dL (ref 3.6–5.1)
Alkaline phosphatase (APISO): 57 U/L (ref 33–130)
BILIRUBIN DIRECT: 0.1 mg/dL (ref 0.0–0.2)
BILIRUBIN INDIRECT: 0.2 mg/dL (ref 0.2–1.2)
GLOBULIN: 2.4 g/dL (ref 1.9–3.7)
Total Bilirubin: 0.3 mg/dL (ref 0.2–1.2)
Total Protein: 6.3 g/dL (ref 6.1–8.1)

## 2016-12-02 LAB — HEMOGLOBIN A1C
Hgb A1c MFr Bld: 5.7 % of total Hgb — ABNORMAL HIGH (ref <5.7)
Mean Plasma Glucose: 117 (calc)
eAG (mmol/L): 6.5 (calc)

## 2016-12-02 LAB — TSH: TSH: 2.79 mIU/L (ref 0.40–4.50)

## 2016-12-03 NOTE — Progress Notes (Signed)
[facility]  [phone] [phone]) [phone]     ID: HIDAYA DANIEL DOB: 1950/08/30  MR#: [id number]  BPZ#:[id number]  Patient Care Team: Unk Pinto, MD as PCP - General (Internal Medicine) Alphonsa Overall, MD as Consulting Physician (General Surgery) Mahayla Haddaway, Virgie Dad, MD as Consulting Physician (Oncology) Thea Silversmith, MD (Inactive) as Consulting Physician (Radiation Oncology) Sylvan Cheese, NP as Nurse Practitioner (Hematology and Oncology) PCP: Unk Pinto, MD OTHER MD:  CHIEF COMPLAINT: Estrogen receptor positive breast cancer  CURRENT TREATMENT:  anastrozole   BREAST CANCER HISTORY: From the original intake note:  Markala's had routine bilateral digital screening mammography 11/16/2014 at the [facility], showing a possible mass in the right breast. Right diagnostic mammography with tomosynthesis and right breast ultrasonography 11/27/2014 showed the breast density to be category B. There was a persistent spiculated mass in the upper outer right breast measuring 6 mm. Posterior to that there was a second mass measuring 1.6 cm. There was no palpable mass by exam. Ultrasonography confirmed a 0.6 cm irregular hypoechoic mass in the right breast at 10:00 7 cm from the nipple and another measuring 0.6 cm also in the 10:00 location but 9 cm from the nipple. The 2 masses were separated by approximately 2.9 cm. The axilla was sonographically benign.  On 11/28/2014 the patient underwent biopsy of the 2 masses in question the more posterior mass at 9 cm from the nipple was an invasive ductal carcinoma, grade 1, estrogen receptor 100% positive, progesterone receptor 85% positive, with an MIB-1 of 10% and no HER-2 amplification, the signals ratio being 1.41 and the number per cell 2.05.  The more anterior mass (measuring 0.6 cm) also showed an invasive ductal carcinoma, grade 1, estrogen receptor 100% positive, but progesterone receptor negative.  The MIB-1 was 5%. This was also HER-2 negative, with a signals ratio 1.29 and the number per cell 2.00.  The patient's subsequent history is as detailed below  INTERVAL HISTORY: Valery returns today for follow-up and treatment of her estrogen receptor positive breast cancer.  She continues on anastrozole, with good tolerance. She denies hot flashes but notes that she has mild vaginal dryness.   Since her last visit to the office, she underwent bilateral diagnostic mammography with tomography at [facility] on 11/19/2016 with results showing: Breast density Category B.  No mammographic evidence of malignancy in either breast. Interval reduction in size of seroma at right breast lumpectomy site.   REVIEW OF SYSTEMS:  Shawntel reports that she was advised that she had a left broken heel and was advised by an Podiatrist of this. She notes that she was evaluated by an Orthopedic Surgeon as well. She was placed in a boot and was given thick sole inserts. She denies injury prior to this, but reports that she plays outside with her grandchildren. As far as exercise, she is able to walk better and averages 2 miles a day while at work. She notes improved fatigue. She was discontinued from Fenofibrate and placed on Zetia by her PCP. She denies unusual headaches, visual changes, nausea, vomiting, or dizziness. There has been no unusual cough, phlegm production, or pleurisy. This been no change in bowel or bladder habits. She denies unexplained fatigue or unexplained weight loss, bleeding, rash, or fever. A detailed review of systems was otherwise stable.    PAST MEDICAL HISTORY: Past Medical History:  Diagnosis Date  . Allergy    SEASONAL  . Anxiety   . At risk for difficult insertion  of breathing tube   . Breast cancer ([place])   . Breast cancer of upper-outer quadrant of right female breast ([place]) 12/03/2014  . 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 GERD (gastroesophageal reflux disease)    hx -  resolved with lap band surgery  . H/O hiatal hernia    resolved with band surgery  . Hyperlipidemia   . Hypertension   . Insomnia    tx with zanax  . 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 Type II or unspecified type diabetes mellitus without mention of complication, not stated as uncontrolled 12/14/2012   no meds, diet controlled    PAST SURGICAL HISTORY: Past Surgical History:  Procedure Laterality Date  . APPENDECTOMY  08/06/12   lap appy  . BACK SURGERY     cervical  . BREAST LUMPECTOMY WITH NEEDLE LOCALIZATION AND AXILLARY SENTINEL LYMPH NODE BX Right 12/28/2014   Procedure: RIGHT BREAST LUMPECTOMY WITH TWO (2) NEEDLE LOCALIZATION AND AXILLARY SENTINEL LYMPH NODE BX;  Surgeon: Alphonsa Overall, MD;  Location: [facility];  Service: General;  Laterality: Right;  . CESAREAN SECTION     x 1  . CHOLECYSTECTOMY    . COLONOSCOPY  09/29/2004   normal  . DIAGNOSTIC LAPAROSCOPY    . HERNIA REPAIR  2008  . LAPAROSCOPIC APPENDECTOMY N/A 08/06/2012   Procedure: APPENDECTOMY LAPAROSCOPIC;  Surgeon: Earnstine Regal, MD;  Location: [facility];  Service: General;  Laterality: N/A;  . LAPAROSCOPIC GASTRIC BANDING WITH HIATAL HERNIA REPAIR N/A Nov 2008  . TUBAL LIGATION      FAMILY HISTORY Family History  Problem Relation Age of Onset  . Cirrhosis Mother   . Alcohol abuse Mother   . Liver disease Mother   . Diabetes Father   . Hypertension Father   . Stroke Father   . Heart disease Father   . AAA (abdominal aortic aneurysm) Sister   . Cancer Cousin        Breast  . Breast cancer Cousin        x3  . Colon cancer Neg Hx   . Rectal cancer Neg Hx   . Stomach cancer Neg Hx    the patient's father died from a stroke at age 72. The patient's mother died from cirrhosis of the liver at age 25. The patient had no brothers. She had 2 sisters, one of whom died from a ruptured aortic aneurysm. There is no history of breast or ovarian cancer in the family to the patient's  knowledge  GYNECOLOGIC HISTORY:  No LMP recorded. Patient is postmenopausal. Menarche age 69, first live birth age 69. The patient is GX P1. She stopped having periods in 2002. She did not take hormone replacement. She used oral contraceptives for 11 years remotely, with no complications.  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 Her husband Mikki Santee is self-employed as a Herbalist area did daughter Warden/ranger lives in [place]. She is working there full-time and the patient tells me her daughter recently has had multiple promotions.  She is divorced. The patient's grand son, 20years old, lives in [place] with the child's father. The patient is not a church attender   ADVANCED DIRECTIVES: Not in place   HEALTH MAINTENANCE: Social History   Tobacco Use  . Smoking status: Never Smoker  . Smokeless tobacco: Never Used  Substance Use Topics  . Alcohol use: No    Alcohol/week: 0.0 oz  .  Drug use: No     Colonoscopy: DEC 2016  PAP:  Bone density: January 2017  Lipid panel:  Allergies  Allergen Reactions  . Codeine Itching  . Lipitor [Atorvastatin] Other (See Comments)    Severe fatigue. ? memory loss.  . Ace Inhibitors     cough    Current Outpatient Medications  Medication Sig Dispense Refill  . acidophilus (RISAQUAD) CAPS Take 1 capsule by mouth daily. Reported on 02/19/2015    . ALPRAZolam (XANAX) 1 MG tablet 1/2-1 tablet 2 to 3 x/day (ONLY AS NEEDED FOR SEVERE ANXIETY) & please limit to 5 days week to avoid addiction 90 tablet 0  . anastrozole (ARIMIDEX) 1 MG tablet Take 1 tablet (1 mg total) by mouth daily. 90 tablet 4  . aspirin 81 MG tablet Take 81 mg by mouth daily.    Marland Kitchen atenolol (TENORMIN) 100 MG tablet TAKE 1 TABLET EVERY DAY 90 tablet 1  . b complex vitamins capsule Take 1 capsule by mouth daily.    . calcium carbonate (TUMS) 500 MG chewable tablet Chew 1,000 mg by mouth at bedtime.     .  Cholecalciferol (VITAMIN D3) 5000 UNITS CAPS Take 5,000 Units by mouth daily.    . Cinnamon 500 MG TABS Take 1 tablet by mouth 2 (two) times daily. Cinnamon 1000 mg, 1 tab in morning and 1 tab evening    . ezetimibe (ZETIA) 10 MG tablet Take 1 tablet daily for Cholesterol 90 tablet 1  . Magnesium 250 MG TABS Take 250 mg by mouth 2 (two) times daily.    . Multiple Vitamins-Minerals (MULTIVITAMIN WITH MINERALS) tablet Take 1 tablet by mouth 2 (two) times daily.    . Omega-3 Fatty Acids (FISH OIL) 1000 MG CAPS Take 1,000 mg by mouth daily.    Marland Kitchen OVER THE COUNTER MEDICATION Take 1 tablet by mouth 2 (two) times daily. Tumeric with black pepper    . verapamil (CALAN) 80 MG tablet TAKE 1 TABLET BY MOUTH TWICE A DAY FOR BLOOD PRESSURE 180 tablet 1   No current facility-administered medications for this visit.     OBJECTIVE: Middle-aged white woman   Vitals:   12/08/16 1457  BP: (!) 144/82  Pulse: 92  Resp: 18  Temp: 97.8 F (36.6 C)  SpO2: 97%     Body mass index is 37.84 kg/m.    ECOG FS:1 - Symptomatic but completely ambulatory  Sclerae unicteric, pupils round and equal Oropharynx clear and moist No cervical or supraclavicular adenopathy Lungs no rales or rhonchi Heart regular rate and rhythm Abd soft, nontender, positive bowel sounds MSK no focal spinal tenderness, no upper extremity lymphedema Neuro: nonfocal, well oriented, appropriate affect Breasts: the right breast is s/p lumpectomy, with no evidence o flocal recurrence. The left breast is unremarkable. Both axillae are benign  LAB RESULTS:  CMP     Component Value Date/Time   NA 141 12/08/2016 1432   K 3.9 12/08/2016 1432   CL 104 12/01/2016 1614   CO2 27 12/08/2016 1432   GLUCOSE 102 12/08/2016 1432   BUN 19.3 12/08/2016 1432   CREATININE 1.0 12/08/2016 1432   CALCIUM 9.5 12/08/2016 1432   PROT 6.8 12/08/2016 1432   ALBUMIN 3.5 12/08/2016 1432   AST 16 12/08/2016 1432   ALT 22 12/08/2016 1432   ALKPHOS 61  12/08/2016 1432   BILITOT 0.30 12/08/2016 1432   GFRNONAA 69 12/01/2016 1614   GFRAA 80 12/01/2016 1614    INo results found for: SPEP, UPEP  Lab Results  Component Value Date   WBC 9.3 12/08/2016   NEUTROABS 5.8 12/08/2016   HGB 13.2 12/08/2016   HCT 40.0 12/08/2016   MCV 95.0 12/08/2016   PLT 343 12/08/2016      Chemistry      Component Value Date/Time   NA 141 12/08/2016 1432   K 3.9 12/08/2016 1432   CL 104 12/01/2016 1614   CO2 27 12/08/2016 1432   BUN 19.3 12/08/2016 1432   CREATININE 1.0 12/08/2016 1432      Component Value Date/Time   CALCIUM 9.5 12/08/2016 1432   ALKPHOS 61 12/08/2016 1432   AST 16 12/08/2016 1432   ALT 22 12/08/2016 1432   BILITOT 0.30 12/08/2016 1432       No results found for: LABCA2  No components found for: LABCA125  No results for input(s): INR in the last 168 hours.  Urinalysis    Component Value Date/Time   COLORURINE YELLOW 02/20/2016 1601   APPEARANCEUR CLEAR 02/20/2016 1601   LABSPEC 1.019 02/20/2016 1601   PHURINE 6.0 02/20/2016 1601   GLUCOSEU NEGATIVE 02/20/2016 1601   HGBUR NEGATIVE 02/20/2016 1601   BILIRUBINUR NEGATIVE 02/20/2016 1601   KETONESUR NEGATIVE 02/20/2016 1601   PROTEINUR NEGATIVE 02/20/2016 1601   UROBILINOGEN 0.2 05/30/2013 0903   NITRITE NEGATIVE 02/20/2016 1601   LEUKOCYTESUR 1+ (A) 02/20/2016 1601    STUDIES: Mm Diag Breast Tomo Bilateral  Result Date: 11/19/2016 CLINICAL DATA:  66 year old female with history of right breast cancer post lumpectomy in 2016. EXAM: 2D DIGITAL DIAGNOSTIC BILATERAL MAMMOGRAM WITH CAD AND ADJUNCT TOMO COMPARISON:  Previous exam(s). ACR Breast Density Category b: There are scattered areas of fibroglandular density. FINDINGS: No suspicious masses or calcifications are seen in either breast. Postsurgical changes are again identified in the upper-outer posterior right breast related to prior lumpectomy with interval decrease in size of seroma. Spot compression  magnification X CC L view of the lumpectomy site in the right breast was performed. There is no mammographic evidence of locally recurrent malignancy. A can meet with fat Mammographic images were processed with CAD. IMPRESSION: No mammographic evidence of malignancy in either breast. Interval reduction in size of seroma at right breast lumpectomy site. RECOMMENDATION: Diagnostic mammogram is suggested in 1 year. (Code:DM-B-01Y) I have discussed the findings and recommendations with the patient. Results were also provided in writing at the conclusion of the visit. If applicable, a reminder letter will be sent to the patient regarding the next appointment. BI-RADS CATEGORY  2: Benign. Electronically Signed   By: Everlean Alstrom M.D.   On: 11/19/2016 08:48      ASSESSMENT: 66 y.o. [place] woman status post Right breast upper outer quadrant biopsy 11/28/2014 of 2 separate right breast masses, both invasive ductal carcinomas, grade 1, estrogen receptor positive, and HER-2 not amplified. MIB-1 is 5-10%. The smaller mass is progesterone receptor negative  (1) Status post right lumpectomy and sentinel lymph node sampling 12/28/2014 for an mpT1c pN0, stage IA invasive ductal carcinoma, grade 2, repeat HER-2 again negative  (2) Oncotype score of 16 predict sent outside the breast risk of recurrence within 10 years of 10% if the patient's only systemic therapy is tamoxifen for 5 years. It also predicts no benefit from adjuvant chemotherapy.  (3) adjuvant radiation 02/18/2015-04/03/2015:  1. Right breast / 45 Gray at 1.8 Gray per fraction x 25 fractions 2. Right breast boost / 16 Gray at [employer] per fraction x 8 fractions  (4) Anastrozole started 05/13/2015  (a) bone  density 01/18/2015 was normal with a T score of 0.4  (5) polycystic kidney disease  (a) testosterone level 10 ng/dL [normal 6-86 in women] 07/17/2015   PLAN   Galadriel is now just about 2 years out from definitive surgery for her breast  cancer with no evidence of disease recurrence.  This is very favorable.  She is tolerating the anastrozole with essentially no side effects.  The plan will be to continue that for a total of 5 years.  We will repeat a bone density with her next set of mammograms, November of next year.  Her past 1 of course was normal  She will see me again in a year.  She knows to call for any problems that may develop before her next visit.  Holliday Sheaffer, Virgie Dad, MD  12/08/16 3:21 PM Medical Oncology and Hematology [facility] [address] [place], [place] [address] Tel. [phone]    Fax. [phone]    This document serves as a record of services personally performed by Lurline Del, MD. It was created on his behalf by Steva Colder, a trained medical scribe. The creation of this record is based on the scribe's personal observations and the provider's statements to them.   I have reviewed the above documentation for accuracy and completeness, and I agree with the above.

## 2016-12-04 ENCOUNTER — Other Ambulatory Visit: Payer: Self-pay | Admitting: Internal Medicine

## 2016-12-07 ENCOUNTER — Other Ambulatory Visit: Payer: Self-pay | Admitting: *Deleted

## 2016-12-07 DIAGNOSIS — Z170 Estrogen receptor positive status [ER+]: Principal | ICD-10-CM

## 2016-12-07 DIAGNOSIS — C50411 Malignant neoplasm of upper-outer quadrant of right female breast: Secondary | ICD-10-CM

## 2016-12-08 ENCOUNTER — Other Ambulatory Visit (HOSPITAL_BASED_OUTPATIENT_CLINIC_OR_DEPARTMENT_OTHER): Payer: Medicare Other

## 2016-12-08 ENCOUNTER — Telehealth: Payer: Self-pay | Admitting: Oncology

## 2016-12-08 ENCOUNTER — Ambulatory Visit (HOSPITAL_BASED_OUTPATIENT_CLINIC_OR_DEPARTMENT_OTHER): Payer: Medicare Other | Admitting: Oncology

## 2016-12-08 VITALS — BP 144/82 | HR 92 | Temp 97.8°F | Resp 18 | Ht 63.5 in | Wt 217.0 lb

## 2016-12-08 DIAGNOSIS — C50411 Malignant neoplasm of upper-outer quadrant of right female breast: Secondary | ICD-10-CM

## 2016-12-08 DIAGNOSIS — N898 Other specified noninflammatory disorders of vagina: Secondary | ICD-10-CM | POA: Diagnosis not present

## 2016-12-08 DIAGNOSIS — Z170 Estrogen receptor positive status [ER+]: Secondary | ICD-10-CM | POA: Diagnosis not present

## 2016-12-08 LAB — CBC WITH DIFFERENTIAL/PLATELET
BASO%: 0.3 % (ref 0.0–2.0)
BASOS ABS: 0 10*3/uL (ref 0.0–0.1)
EOS ABS: 0.2 10*3/uL (ref 0.0–0.5)
EOS%: 1.8 % (ref 0.0–7.0)
HCT: 40 % (ref 34.8–46.6)
HGB: 13.2 g/dL (ref 11.6–15.9)
LYMPH%: 26.7 % (ref 14.0–49.7)
MCH: 31.4 pg (ref 25.1–34.0)
MCHC: 33 g/dL (ref 31.5–36.0)
MCV: 95 fL (ref 79.5–101.0)
MONO#: 0.8 10*3/uL (ref 0.1–0.9)
MONO%: 8.2 % (ref 0.0–14.0)
NEUT%: 63 % (ref 38.4–76.8)
NEUTROS ABS: 5.8 10*3/uL (ref 1.5–6.5)
Platelets: 343 10*3/uL (ref 145–400)
RBC: 4.21 10*6/uL (ref 3.70–5.45)
RDW: 13.1 % (ref 11.2–14.5)
WBC: 9.3 10*3/uL (ref 3.9–10.3)
lymph#: 2.5 10*3/uL (ref 0.9–3.3)

## 2016-12-08 LAB — COMPREHENSIVE METABOLIC PANEL
ALT: 22 U/L (ref 0–55)
AST: 16 U/L (ref 5–34)
Albumin: 3.5 g/dL (ref 3.5–5.0)
Alkaline Phosphatase: 61 U/L (ref 40–150)
Anion Gap: 10 mEq/L (ref 3–11)
BUN: 19.3 mg/dL (ref 7.0–26.0)
CO2: 27 meq/L (ref 22–29)
Calcium: 9.5 mg/dL (ref 8.4–10.4)
Chloride: 104 mEq/L (ref 98–109)
Creatinine: 1 mg/dL (ref 0.6–1.1)
EGFR: 57 mL/min/{1.73_m2} — AB (ref >60)
GLUCOSE: 102 mg/dL (ref 70–140)
POTASSIUM: 3.9 meq/L (ref 3.5–5.1)
SODIUM: 141 meq/L (ref 136–145)
Total Bilirubin: 0.3 mg/dL (ref 0.20–1.20)
Total Protein: 6.8 g/dL (ref 6.4–8.3)

## 2016-12-08 NOTE — Telephone Encounter (Signed)
Scheduled appt per 11/27 los - lab and f/u in one year - sent reminder letter in the mail.

## 2016-12-14 MED ORDER — DICLOFENAC SODIUM 1 % TD GEL: 4.0000 g | Freq: Four times a day (QID) | TRANSDERMAL | 3 refills | Status: DC

## 2016-12-19 ENCOUNTER — Other Ambulatory Visit: Payer: Self-pay | Admitting: Internal Medicine

## 2017-02-03 ENCOUNTER — Other Ambulatory Visit: Payer: Self-pay | Admitting: Internal Medicine

## 2017-02-19 ENCOUNTER — Other Ambulatory Visit: Payer: Self-pay | Admitting: Physician Assistant

## 2017-02-20 ENCOUNTER — Other Ambulatory Visit: Payer: Self-pay | Admitting: Internal Medicine

## 2017-02-20 ENCOUNTER — Other Ambulatory Visit: Payer: Self-pay | Admitting: Physician Assistant

## 2017-02-20 DIAGNOSIS — E782 Mixed hyperlipidemia: Secondary | ICD-10-CM

## 2017-03-18 ENCOUNTER — Other Ambulatory Visit: Payer: Self-pay | Admitting: Physician Assistant

## 2017-03-20 ENCOUNTER — Other Ambulatory Visit: Payer: Self-pay | Admitting: Oncology

## 2017-03-25 DIAGNOSIS — M7662 Achilles tendinitis, left leg: Secondary | ICD-10-CM | POA: Diagnosis not present

## 2017-03-25 DIAGNOSIS — M71572 Other bursitis, not elsewhere classified, left ankle and foot: Secondary | ICD-10-CM | POA: Diagnosis not present

## 2017-03-30 NOTE — Patient Instructions (Signed)

## 2017-03-30 NOTE — Progress Notes (Signed)
[facility] Unk Pinto, M.D.     Uvaldo Bristle. Silverio Lay, P.A.-C Liane Comber, [facility] [address] [place], [place] [address] Telephone [phone] Telefax [phone]  Comprehensive Evaluation &  Examination     This very nice 67 y.o.  MWF presents for a comprehensive evaluation and management of multiple medical co-morbidities.  Patient has been followed for HTN, HLD, T2_NIDDM / Prediabetes  and Vitamin D Deficiency.      In Dec 2016 , she was dx'd with a ER (+) R Breast Cancer and underwent R lumpectomy by Dr Alphonsa Overall followed by Radiation (Dr Pablo Ledger) and has been treated with primary hormonal suppression with Anastrozole prescribed by Dr [name].       HTN predates since 1995. Patient's BP has been controlled at home and patient denies any cardiac symptoms as chest pain, palpitations, shortness of breath, dizziness or ankle swelling. Today's BP is at goal -  126/82.      Patient's hyperlipidemia is controlled with diet and medications. Patient denies myalgias or other medication SE's. Last lipids were not at goal with elevated LDL: Lab Results  Component Value Date   CHOL 203 (H) 03/31/2017   HDL 51 03/31/2017   LDLCALC 107 (H) 03/31/2017   TRIG 336 (H) 03/31/2017   CHOLHDL 4.0 03/31/2017       She has hx/o PreDiabetes predating back to 2008 when she underwent Gastric Lap band Bariatric surg (2011)  for Morbid Obesity (wt 229#) losing down to 186# and tragically regained all of her initial weight (60#) loss and then  more. Patient was treated with Metformin 2012 - 2016 for  T2_DM w/ CKD (GFR 47) and she was able to wean off of   MF (2016) & maintain her sugars in control.  Patient denies reactive hypoglycemic symptoms, visual blurring, diabetic polys, or paresthesias. Last A1c was near goal: Lab Results  Component Value Date   HGBA1C 5.8 (H) 03/31/2017      Finally, patient has history of  Vitamin D Deficiency ("28"/2008)  and last Vitamin D was at goal: Lab Results  Component Value Date   VD25OH 82 03/31/2017   Current Outpatient Medications on File Prior to Visit  Medication Sig  . acidophilus (RISAQUAD) CAPS Take 1 capsule by mouth daily. Reported on 02/19/2015  . ALPRAZolam (XANAX) 1 MG tablet TAKE 1/2 TO 1 TABLET BY MOUTH 2-3 TIMES DAILY ONLY AS NEEDED FOR SEVERE ANXIETY. LIMIT TO 5 DAYS/WEEK TO AVOID ADDICTION.  Marland Kitchen anastrozole (ARIMIDEX) 1 MG tablet TAKE 1 TABLET BY MOUTH EVERY DAY  . aspirin 81 MG tablet Take 81 mg by mouth daily.  Marland Kitchen atenolol (TENORMIN) 100 MG tablet TAKE 1 TABLET BY MOUTH EVERY DAY  . b complex vitamins capsule Take 1 capsule by mouth daily.  . calcium carbonate (TUMS) 500 MG chewable tablet Chew 1,000 mg by mouth at bedtime.   . Cholecalciferol (VITAMIN D3) 5000 UNITS CAPS Take 5,000 Units by mouth 2 (two) times daily.   . Cinnamon 500 MG TABS Take 1 tablet by mouth daily. Cinnamon 1000 mg, 1 tab in morning and 1 tab evening   . diclofenac sodium (VOLTAREN) 1 % GEL Apply 4 g topically 4 (four) times daily.  Marland Kitchen ezetimibe (ZETIA) 10 MG tablet TAKE 1 TABLET BY MOUTH DAILY FOR CHOLESTEROL  . Magnesium 250 MG TABS Take 250 mg by mouth 2 (two) times daily.  . Multiple Vitamins-Minerals (MULTIVITAMIN WITH MINERALS) tablet Take 1 tablet by  mouth 2 (two) times daily.  . Omega-3 Fatty Acids (FISH OIL) 1000 MG CAPS Take 1,000 mg by mouth daily.  Marland Kitchen OVER THE COUNTER MEDICATION Take 1 tablet by mouth 2 (two) times daily. Tumeric with black pepper  . verapamil (CALAN) 80 MG tablet TAKE 1 TABLET BY MOUTH TWICE DAILY FOR BLOOD PRESSURE   No current facility-administered medications on file prior to visit.    Allergies  Allergen Reactions  . Codeine Itching  . Lipitor [Atorvastatin] Other (See Comments)    Severe fatigue. ? memory loss.  . Ace Inhibitors     cough   Past Medical History:  Diagnosis Date  . Allergy    SEASONAL  . Anxiety   . At risk for  difficult insertion of breathing tube   . Breast cancer ([place])   . Breast cancer of upper-outer quadrant of right female breast ([facility]) 12/03/2014  . 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 GERD (gastroesophageal reflux disease)    hx - resolved with lap band surgery  . H/O hiatal hernia    resolved with band surgery  . Hyperlipidemia   . Hypertension   . Insomnia    tx with zanax  . 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 Type II or unspecified type diabetes mellitus without mention of complication, not stated as uncontrolled 12/14/2012   no meds, diet controlled   Health Maintenance  Topic Date Due  . OPHTHALMOLOGY EXAM  02/22/2014  . PAP SMEAR  12/15/2015  . FOOT EXAM  02/19/2017  . URINE MICROALBUMIN  02/19/2017  . HEMOGLOBIN A1C  10/01/2017  . MAMMOGRAM  11/20/2018  . TETANUS/TDAP  01/04/2024  . COLONOSCOPY  12/16/2024  . INFLUENZA VACCINE  Completed  . DEXA SCAN  Completed  . Hepatitis C Screening  Completed  . HIV Screening  Completed  . PNA vac Low Risk Adult  Completed   Immunization History  Administered Date(s) Administered  . DT 12/18/2013  . DTaP 01/12/2002  . Influenza Split 12/14/2012, 11/19/2013, 10/27/2014  . Influenza-Unspecified 10/22/2015, 10/25/2016  . PPD Test 12/14/2012, 12/18/2013, 01/24/2015, 02/20/2016  . Pneumococcal Conjugate-13 10/09/2013  . Pneumococcal Polysaccharide-23 01/13/1992, 03/31/2017   Last Colon - 12.05.2016 - Dr Silverio Decamp hyperplastic polyp recc 10 yr f/u 12/2024  Last MGM - 11.08.2018 - Dr [name]   Past Surgical History:  Procedure Laterality Date  . APPENDECTOMY  08/06/12   lap appy  . BACK SURGERY     cervical  . BREAST LUMPECTOMY WITH NEEDLE LOCALIZATION AND AXILLARY SENTINEL LYMPH NODE BX Right 12/28/2014   Procedure: RIGHT BREAST LUMPECTOMY WITH TWO (2) NEEDLE LOCALIZATION AND AXILLARY SENTINEL LYMPH NODE BX;  Surgeon: Alphonsa Overall, MD;  Location: [facility];  Service: General;   Laterality: Right;  . CESAREAN SECTION     x 1  . CHOLECYSTECTOMY    . COLONOSCOPY  09/29/2004   normal  . DIAGNOSTIC LAPAROSCOPY    . HERNIA REPAIR  2008  . LAPAROSCOPIC APPENDECTOMY N/A 08/06/2012   Procedure: APPENDECTOMY LAPAROSCOPIC;  Surgeon: Earnstine Regal, MD;  Location: [facility];  Service: General;  Laterality: N/A;  . LAPAROSCOPIC GASTRIC BANDING WITH HIATAL HERNIA REPAIR N/A Nov 2008  . TUBAL LIGATION     Family History  Problem Relation Age of Onset  . Cirrhosis Mother   . Alcohol abuse Mother   . Liver disease Mother   . Diabetes Father   . Hypertension Father   . Stroke Father   . Heart  disease Father   . AAA (abdominal aortic aneurysm) Sister   . Cancer Cousin        Breast  . Breast cancer Cousin        x3  . Colon cancer Neg Hx   . Rectal cancer Neg Hx   . Stomach cancer Neg Hx    Social History   Tobacco Use  . Smoking status: Never Smoker  . Smokeless tobacco: Never Used  Substance Use Topics  . Alcohol use: No    Alcohol/week: 0.0 oz  . Drug use: No    ROS Constitutional: Denies fever, chills, weight loss/gain, headaches, insomnia,  night sweats, and change in appetite. Does c/o fatigue. Eyes: Denies redness, blurred vision, diplopia, discharge, itchy, watery eyes.  ENT: Denies discharge, congestion, post nasal drip, epistaxis, sore throat, earache, hearing loss, dental pain, Tinnitus, Vertigo, Sinus pain, snoring.  Cardio: Denies chest pain, palpitations, irregular heartbeat, syncope, dyspnea, diaphoresis, orthopnea, PND, claudication, edema Respiratory: denies cough, dyspnea, DOE, pleurisy, hoarseness, laryngitis, wheezing.  Gastrointestinal: Denies dysphagia, heartburn, reflux, water brash, pain, cramps, nausea, vomiting, bloating, diarrhea, constipation, hematemesis, melena, hematochezia, jaundice, hemorrhoids Genitourinary: Denies dysuria, frequency, urgency, nocturia, hesitancy, discharge, hematuria, flank pain Breast: Breast lumps, nipple  discharge, bleeding.  Musculoskeletal: Denies arthralgia, myalgia, stiffness, Jt. Swelling, pain, limp, and strain/sprain. Denies falls. Skin: Denies puritis, rash, hives, warts, acne, eczema, changing in skin lesion Neuro: No weakness, tremor, incoordination, spasms, paresthesia, pain Psychiatric: Denies confusion, memory loss, sensory loss. Denies Depression. Endocrine: Denies change in weight, skin, hair change, nocturia, and paresthesia, diabetic polys, visual blurring, hyper / hypo glycemic episodes.  Heme/Lymph: No excessive bleeding, bruising, enlarged lymph nodes.  Physical Exam  BP 126/82   Pulse 80   Temp (!) 97.3 F (36.3 C)   Resp 16   Ht 5' 3.5" (1.613 m)   Wt 220 lb (99.8 kg)   BMI 38.36 kg/m   General Appearance: Over nourished, well groomed and in no apparent distress.  Eyes: PERRLA, EOMs, conjunctiva no swelling or erythema, normal fundi and vessels. Sinuses: No frontal/maxillary tenderness ENT/Mouth: EACs patent / TMs  nl. Nares clear without erythema, swelling, mucoid exudates. Oral hygiene is good. No erythema, swelling, or exudate. Tongue normal, non-obstructing. Tonsils not swollen or erythematous. Hearing normal.  Neck: Supple, thyroid not palpable. No bruits, nodes or JVD. Respiratory: Respiratory effort normal.  BS equal and clear bilateral without rales, rhonci, wheezing or stridor. Cardio: Heart sounds are normal with regular rate and rhythm and no murmurs, rubs or gallops. Peripheral pulses are normal and equal bilaterally without edema. No aortic or femoral bruits. Chest: symmetric with normal excursions and percussion. Breasts: Symmetric, without lumps, nipple discharge, retractions, or fibrocystic changes.  Abdomen: Flat, soft with bowel sounds active. Nontender, no guarding, rebound, hernias, masses, or organomegaly.  Lymphatics: Non tender without lymphadenopathy.  Musculoskeletal: Full ROM all peripheral extremities, joint stability, 5/5 strength, and  normal gait. Skin: Warm and dry without rashes, lesions, cyanosis, clubbing or  ecchymosis.  Neuro: Cranial nerves intact, reflexes equal bilaterally. Normal muscle tone, no cerebellar symptoms. Sensation intact to touch, vibratory and Monofilament to the toes bilaterally. Pysch: Alert and oriented X 3, normal affect, Insight and Judgment appropriate.   Assessment and Plan  1. Essential hypertension  - EKG 12-Lead - Urinalysis, Routine w reflex microscopic - Microalbumin / creatinine urine ratio - CBC with Differential/Platelet - BASIC METABOLIC PANEL WITH GFR - Magnesium - TSH  2. Hyperlipidemia, mixed  - EKG 12-Lead - Hepatic function panel -  Lipid panel - TSH  3. Type 2 diabetes mellitus with stage 3 chronic kidney disease, without long-term current use of insulin (H[facility])  - EKG 12-Lead - Urinalysis, Routine w reflex microscopic - Microalbumin / creatinine urine ratio - BASIC METABOLIC PANEL WITH GFR  5. Morbid obesity ([place])  6. Urinary tract infection with hematuria, site unspecified  - Urine Culture  7. Need for prophylactic vaccination against Streptococcus pneumoniae (pneumococcus)  - Pneumococcal polysaccharide vaccine 23-valent greater than or equal to 2yo subcutaneous/IM  8. Screening for colorectal cancer  - POC Hemoccult Bld/Stl   9. FHx: heart disease  - EKG 12-Lead  10. Screening for ischemic heart disease  - EKG 12-Lead  11. Medication management  - Urinalysis, Routine w reflex microscopic - Microalbumin / creatinine urine ratio - CBC with Differential/Platelet - BASIC METABOLIC PANEL WITH GFR - Hepatic function panel - Magnesium - Lipid panel - TSH - Hemoglobin A1c - Insulin, random - VITAMIN D 25 Hydroxyl        Patient was counseled in prudent diet to achieve/maintain BMI less than 25 for weight control, BP monitoring, regular exercise and medications. Discussed med's effects and SE's. Screening labs and tests as requested with  regular follow-up as recommended. Over 40 minutes of exam, counseling, chart review and high complex critical decision making was performed.

## 2017-03-31 ENCOUNTER — Ambulatory Visit (INDEPENDENT_AMBULATORY_CARE_PROVIDER_SITE_OTHER): Payer: Medicare Other | Admitting: Internal Medicine

## 2017-03-31 ENCOUNTER — Other Ambulatory Visit: Payer: Self-pay | Admitting: Oncology

## 2017-03-31 VITALS — BP 126/82 | HR 80 | Temp 97.3°F | Resp 16 | Ht 63.5 in | Wt 220.0 lb

## 2017-03-31 DIAGNOSIS — R7303 Prediabetes: Secondary | ICD-10-CM | POA: Diagnosis not present

## 2017-03-31 DIAGNOSIS — N390 Urinary tract infection, site not specified: Secondary | ICD-10-CM

## 2017-03-31 DIAGNOSIS — R319 Hematuria, unspecified: Secondary | ICD-10-CM | POA: Diagnosis not present

## 2017-03-31 DIAGNOSIS — Z8249 Family history of ischemic heart disease and other diseases of the circulatory system: Secondary | ICD-10-CM

## 2017-03-31 DIAGNOSIS — E1122 Type 2 diabetes mellitus with diabetic chronic kidney disease: Secondary | ICD-10-CM

## 2017-03-31 DIAGNOSIS — N183 Chronic kidney disease, stage 3 unspecified: Secondary | ICD-10-CM

## 2017-03-31 DIAGNOSIS — Z23 Encounter for immunization: Secondary | ICD-10-CM

## 2017-03-31 DIAGNOSIS — E782 Mixed hyperlipidemia: Secondary | ICD-10-CM

## 2017-03-31 DIAGNOSIS — Z79899 Other long term (current) drug therapy: Secondary | ICD-10-CM

## 2017-03-31 DIAGNOSIS — I10 Essential (primary) hypertension: Secondary | ICD-10-CM | POA: Diagnosis not present

## 2017-03-31 DIAGNOSIS — Z136 Encounter for screening for cardiovascular disorders: Secondary | ICD-10-CM

## 2017-03-31 DIAGNOSIS — Z1211 Encounter for screening for malignant neoplasm of colon: Secondary | ICD-10-CM

## 2017-03-31 DIAGNOSIS — E559 Vitamin D deficiency, unspecified: Secondary | ICD-10-CM | POA: Diagnosis not present

## 2017-03-31 DIAGNOSIS — Z853 Personal history of malignant neoplasm of breast: Secondary | ICD-10-CM

## 2017-03-31 DIAGNOSIS — Z1212 Encounter for screening for malignant neoplasm of rectum: Secondary | ICD-10-CM | POA: Diagnosis not present

## 2017-04-01 ENCOUNTER — Other Ambulatory Visit: Payer: Self-pay | Admitting: Internal Medicine

## 2017-04-01 ENCOUNTER — Encounter: Payer: Self-pay | Admitting: Internal Medicine

## 2017-04-01 DIAGNOSIS — R319 Hematuria, unspecified: Secondary | ICD-10-CM | POA: Diagnosis not present

## 2017-04-01 DIAGNOSIS — N390 Urinary tract infection, site not specified: Secondary | ICD-10-CM | POA: Diagnosis not present

## 2017-04-01 LAB — CBC WITH DIFFERENTIAL/PLATELET
Basophils Absolute: 46 cells/uL (ref 0–200)
Basophils Relative: 0.6 %
EOS PCT: 1.8 %
Eosinophils Absolute: 139 cells/uL (ref 15–500)
HCT: 37.2 % (ref 35.0–45.0)
Hemoglobin: 12.9 g/dL (ref 11.7–15.5)
LYMPHS ABS: 2610 {cells}/uL (ref 850–3900)
MCH: 30.9 pg (ref 27.0–33.0)
MCHC: 34.7 g/dL (ref 32.0–36.0)
MCV: 89 fL (ref 80.0–100.0)
MPV: 9.1 fL (ref 7.5–12.5)
Monocytes Relative: 6.6 %
NEUTROS PCT: 57.1 %
Neutro Abs: 4397 cells/uL (ref 1500–7800)
PLATELETS: 356 10*3/uL (ref 140–400)
RBC: 4.18 10*6/uL (ref 3.80–5.10)
RDW: 12.7 % (ref 11.0–15.0)
TOTAL LYMPHOCYTE: 33.9 %
WBC mixed population: 508 cells/uL (ref 200–950)
WBC: 7.7 10*3/uL (ref 3.8–10.8)

## 2017-04-01 LAB — VITAMIN D 25 HYDROXY (VIT D DEFICIENCY, FRACTURES): Vit D, 25-Hydroxy: 82 ng/mL (ref 30–100)

## 2017-04-01 LAB — URINALYSIS, ROUTINE W REFLEX MICROSCOPIC
Bacteria, UA: NONE SEEN /HPF
Bilirubin Urine: NEGATIVE
GLUCOSE, UA: NEGATIVE
HYALINE CAST: NONE SEEN /LPF
Ketones, ur: NEGATIVE
Nitrite: NEGATIVE
PROTEIN: NEGATIVE
SPECIFIC GRAVITY, URINE: 1.011 (ref 1.001–1.03)
Squamous Epithelial / LPF: NONE SEEN /HPF (ref <=5)
pH: 6.5 (ref 5.0–8.0)

## 2017-04-01 LAB — HEPATIC FUNCTION PANEL
AG Ratio: 1.5 (calc) (ref 1.0–2.5)
ALBUMIN MSPROF: 3.7 g/dL (ref 3.6–5.1)
ALT: 18 U/L (ref 6–29)
AST: 18 U/L (ref 10–35)
Alkaline phosphatase (APISO): 64 U/L (ref 33–130)
BILIRUBIN DIRECT: 0.1 mg/dL (ref 0.0–0.2)
Globulin: 2.5 g/dL (calc) (ref 1.9–3.7)
Indirect Bilirubin: 0.3 mg/dL (calc) (ref 0.2–1.2)
TOTAL PROTEIN: 6.2 g/dL (ref 6.1–8.1)
Total Bilirubin: 0.4 mg/dL (ref 0.2–1.2)

## 2017-04-01 LAB — BASIC METABOLIC PANEL WITH GFR
BUN: 24 mg/dL (ref 7–25)
CALCIUM: 9 mg/dL (ref 8.6–10.4)
CHLORIDE: 101 mmol/L (ref 98–110)
CO2: 29 mmol/L (ref 20–32)
Creat: 0.95 mg/dL (ref 0.50–0.99)
GFR, Est African American: 73 mL/min/{1.73_m2} (ref >=60)
GFR, Est Non African American: 63 mL/min/{1.73_m2} (ref >=60)
GLUCOSE: 107 mg/dL — AB (ref 65–99)
POTASSIUM: 4.1 mmol/L (ref 3.5–5.3)
Sodium: 137 mmol/L (ref 135–146)

## 2017-04-01 LAB — HEMOGLOBIN A1C
EAG (MMOL/L): 6.6 (calc)
Hgb A1c MFr Bld: 5.8 % of total Hgb — ABNORMAL HIGH (ref <5.7)
Mean Plasma Glucose: 120 (calc)

## 2017-04-01 LAB — MICROALBUMIN / CREATININE URINE RATIO
CREATININE, URINE: 49 mg/dL (ref 20–275)
MICROALB/CREAT RATIO: 80 ug/mg{creat} — AB (ref <30)
Microalb, Ur: 3.9 mg/dL

## 2017-04-01 LAB — INSULIN, RANDOM: INSULIN: 7.5 u[IU]/mL (ref 2.0–19.6)

## 2017-04-01 LAB — LIPID PANEL
Cholesterol: 203 mg/dL — ABNORMAL HIGH (ref <200)
HDL: 51 mg/dL (ref >50)
LDL Cholesterol (Calc): 107 mg/dL (calc) — ABNORMAL HIGH
Non-HDL Cholesterol (Calc): 152 mg/dL (calc) — ABNORMAL HIGH (ref <130)
TRIGLYCERIDES: 336 mg/dL — AB (ref <150)
Total CHOL/HDL Ratio: 4 (calc) (ref <5.0)

## 2017-04-01 LAB — TSH: TSH: 3.21 m[IU]/L (ref 0.40–4.50)

## 2017-04-01 LAB — MAGNESIUM: MAGNESIUM: 2 mg/dL (ref 1.5–2.5)

## 2017-04-02 LAB — URINE CULTURE
MICRO NUMBER: 90358297
SPECIMEN QUALITY:: ADEQUATE

## 2017-05-03 ENCOUNTER — Other Ambulatory Visit: Payer: Self-pay | Admitting: Physician Assistant

## 2017-05-19 ENCOUNTER — Other Ambulatory Visit: Payer: Self-pay

## 2017-05-19 DIAGNOSIS — Z1212 Encounter for screening for malignant neoplasm of rectum: Secondary | ICD-10-CM | POA: Diagnosis not present

## 2017-05-19 DIAGNOSIS — Z1211 Encounter for screening for malignant neoplasm of colon: Secondary | ICD-10-CM

## 2017-05-19 LAB — POC HEMOCCULT BLD/STL (HOME/3-CARD/SCREEN)
Card #3 Fecal Occult Blood, POC: NEGATIVE
FECAL OCCULT BLD: NEGATIVE
Fecal Occult Blood, POC: NEGATIVE

## 2017-06-13 ENCOUNTER — Other Ambulatory Visit: Payer: Self-pay | Admitting: Physician Assistant

## 2017-06-22 ENCOUNTER — Other Ambulatory Visit: Payer: Self-pay | Admitting: Oncology

## 2017-07-04 NOTE — Progress Notes (Signed)
MEDICARE ANNUAL WELLNESS VISIT AND FOLLOW UP  Assessment:   Diagnoses and all orders for this visit:  Welcome to Medicare preventive visit EKG, AAA [place] Due next year  Essential hypertension Continue medication Monitor blood pressure at home; call if consistently over 130/80 Continue DASH diet.   Reminder to go to the ER if any CP, SOB, nausea, dizziness, severe HA, changes vision/speech, left arm numbness and tingling and jaw pain.  Vitamin D deficiency At goal at recent check; continue to recommend supplementation for goal of 70-100 Defer vitamin D level  Prediabetes Discussed disease and risks Discussed diet/exercise, weight management  A1C  Morbid obesity ([place]) Long discussion about weight loss, diet, and exercise Recommended diet heavy in fruits and veggies and low in animal meats, cheeses, and dairy products, appropriate calorie intake Patient will work on continue avoiding stress eating, snacking, portion control Discussed appropriate weight for height Follow up at next visit  Mixed hyperlipidemia Continue medications: zetia Continue low cholesterol diet and exercise.  Check lipid panel.   Medication management CBC, CMP/GFR  Malignant neoplasm of upper-outer quadrant of right breast in female, estrogen receptor positive ([place]) S/p lumpectomy/radiation, continue anastrozole Followed by Dr. [name]  Anxiety Well managed by current regimen; continue medications; reminded to limit use of benzo- will plan to taper off after retirement Stress management techniques discussed, increase water, good sleep hygiene discussed, increase exercise, and increase veggies.   Lapband APS + HH repair Nov 2008    Over 40 minutes of exam, counseling, chart review and critical decision making was performed Future Appointments  Date Time Provider [place]  10/05/2017  4:00 PM Unk Pinto, MD [facility] None  11/30/2017  7:30 AM [facility] DX DEXA 1 [facility] GI-BREAST CE   11/30/2017  8:00 AM [facility] DIAG TOMO 1 [facility] GI-BREAST CE  12/02/2017  2:30 PM [facility] LAB 1 [facility] None  12/02/2017  3:00 PM Magrinat, Virgie Dad, MD [facility] None  05/03/2018  3:00 PM Unk Pinto, MD [facility] None     Plan:   During the course of the visit the patient was educated and counseled about appropriate screening and preventive services including:    Pneumococcal vaccine   Prevnar 13  Influenza vaccine  Td vaccine  Screening electrocardiogram  Bone densitometry screening  Colorectal cancer screening  Diabetes screening  Glaucoma screening  Nutrition counseling   Advanced directives: requested   Subjective:  Taylor Oconnor is a 67 y.o. female who presents for Medicare Annual Wellness Visit and 3 month follow up. She has hx of ER+ R breast cancer in 2016, underwent lumpectomy by Dr. Lucia Gaskins and radiation (Dr. Pablo Ledger) and continues with anastrozole followed by Dr. [name]. She has hx of gastric lap band in 2011 with 60 lb weight loss though has unfortunately regained weight after anastrozole treatment.  she has a diagnosis of anxiety and is currently on xanax 1 mg once daily at night, reports symptoms are well controlled on current regimen. she is anticipating reduced stress after she retires from her current position this winter and plans to taper off of medications.   BMI is Body mass index is 36.97 kg/m., she has been working on diet, has been watching her food intake while under stress, limiting portion sizes and not eating after dinner. Exercise is limited.  Wt Readings from Last 3 Encounters:  07/05/17 212 lb (96.2 kg)  03/31/17 220 lb (99.8 kg)  12/08/16 217 lb (98.4 kg)    Her blood pressure has been controlled at home, today  their BP is BP: 110/64 She does not workout. She denies chest pain, shortness of breath, dizziness.   She is on cholesterol medication (zetia 10 mg daily) and denies myalgias. Her cholesterol is not at  goal. The cholesterol last visit was:   Lab Results  Component Value Date   CHOL 203 (H) 03/31/2017   HDL 51 03/31/2017   LDLCALC 107 (H) 03/31/2017   TRIG 336 (H) 03/31/2017   CHOLHDL 4.0 03/31/2017    She has been working on diet for prediabetes, and denies foot ulcerations, increased appetite, nausea, paresthesia of the feet, polydipsia, polyuria, visual disturbances, vomiting and weight loss. Last A1C in the office was:  Lab Results  Component Value Date   HGBA1C 5.8 (H) 03/31/2017   Last GFR: Lab Results  Component Value Date   [facility] 63 03/31/2017   Patient is on Vitamin D supplement.   Lab Results  Component Value Date   VD25OH 82 03/31/2017      Medication Review: Current Outpatient Medications on File Prior to Visit  Medication Sig Dispense Refill  . acidophilus (RISAQUAD) CAPS Take 1 capsule by mouth daily. Reported on 02/19/2015    . ALPRAZolam (XANAX) 1 MG tablet TAKE 1/2 TO 1 TABLET BY MOUTH TWO TO THREE TIMES DAILY AS NEEDED FOR SVERE ANXIETY. LIMIT TO 5 DAYS PER WEEK TO AVOID ADDICTION 90 tablet 0  . anastrozole (ARIMIDEX) 1 MG tablet TAKE 1 TABLET BY MOUTH EVERY DAY 90 tablet 0  . aspirin 81 MG tablet Take 81 mg by mouth daily.    Marland Kitchen atenolol (TENORMIN) 100 MG tablet TAKE 1 TABLET BY MOUTH EVERY DAY 90 tablet 1  . b complex vitamins capsule Take 1 capsule by mouth daily.    . calcium carbonate (TUMS) 500 MG chewable tablet Chew 1,000 mg by mouth at bedtime.     . Cholecalciferol (VITAMIN D3) 5000 UNITS CAPS Take 5,000 Units by mouth 2 (two) times daily.     . Cinnamon 500 MG TABS Take 1 tablet by mouth daily. Cinnamon 1000 mg, 1 tab in morning and 1 tab evening     . diclofenac sodium (VOLTAREN) 1 % GEL Apply 4 g topically 4 (four) times daily. 100 g 3  . ezetimibe (ZETIA) 10 MG tablet TAKE 1 TABLET BY MOUTH DAILY FOR CHOLESTEROL 90 tablet 1  . Magnesium 250 MG TABS Take 250 mg by mouth 2 (two) times daily.    . Multiple Vitamins-Minerals (MULTIVITAMIN WITH  MINERALS) tablet Take 1 tablet by mouth 2 (two) times daily.    . Omega-3 Fatty Acids (FISH OIL) 1000 MG CAPS Take 1,000 mg by mouth daily.    Marland Kitchen OVER THE COUNTER MEDICATION Take 1 tablet by mouth 2 (two) times daily. Tumeric with black pepper    . verapamil (CALAN) 80 MG tablet TAKE 1 TABLET BY MOUTH TWICE DAILY FOR BLOOD PRESSURE 180 tablet 3   No current facility-administered medications on file prior to visit.     Allergies  Allergen Reactions  . Codeine Itching  . Lipitor [Atorvastatin] Other (See Comments)    Severe fatigue. ? memory loss.  . Ace Inhibitors     cough    Current Problems (verified) Patient Active Problem List   Diagnosis Date Noted  . Anxiety 12/01/2016  . Prediabetes 11/30/2016  . Malignant neoplasm of upper-outer quadrant of right breast in female, estrogen receptor positive ([place]) 12/03/2014  . Medication management 12/18/2013  . Essential hypertension 12/14/2012  . Mixed hyperlipidemia 12/14/2012  .  Vitamin D deficiency 12/14/2012  . Morbid obesity ([place]) 12/14/2012  . Lapband APS + [facility] repair Nov 2008 08/26/2012    Screening Tests Immunization History  Administered Date(s) Administered  . DT 12/18/2013  . DTaP 01/12/2002  . Influenza Split 12/14/2012, 11/19/2013, 10/27/2014  . Influenza-Unspecified 10/22/2015, 10/25/2016  . PPD Test 12/14/2012, 12/18/2013, 01/24/2015, 02/20/2016  . Pneumococcal Conjugate-13 10/09/2013  . Pneumococcal Polysaccharide-23 01/13/1992, 03/31/2017    Preventative care: Last colonoscopy: 2016 Last mammogram: 11/2016 Last pap smear/pelvic exam: 12/2012 never abnormal DEXA: 01/2015 normal, has bone density scheduled 11/2016  Prior vaccinations: TD or Tdap: 2015  Influenza: 2018  Pneumococcal: 1994, 2019 Prevnar13: 2015 Shingles/Zostavax: Declines  Names of Other Physician/Practitioners you currently use: 1.  [facility] here for primary care 2. Dr. Marland Kitchen At [place], eye doctor,  last visit 02/2017, goes annually 3. , dentist, last visit remote  Patient Care Team: Unk Pinto, MD as PCP - General (Internal Medicine) Alphonsa Overall, MD as Consulting Physician (General Surgery) Magrinat, Virgie Dad, MD as Consulting Physician (Oncology) Thea Silversmith, MD as Consulting Physician (Radiation Oncology) Sylvan Cheese, NP as Nurse Practitioner (Hematology and Oncology)  SURGICAL HISTORY She  has a past surgical history that includes Cholecystectomy; Diagnostic laparoscopy; Hernia repair (2008); Tubal ligation; Back surgery; laparoscopic appendectomy (N/A, 08/06/2012); Appendectomy (08/06/12); Laparoscopic gastric banding with hiatal hernia repair (N/A, Nov 2008); Colonoscopy (09/29/2004); Cesarean section; and Breast lumpectomy with needle localization and axillary sentinel lymph node bx (Right, 12/28/2014). FAMILY HISTORY Her family history includes AAA (abdominal aortic aneurysm) in her sister; Alcohol abuse in her mother; Breast cancer in her cousin; Cancer in her cousin; Cirrhosis in her mother; Diabetes in her father; Heart disease in her father; Hypertension in her father; Liver disease in her mother; Stroke in her father. SOCIAL HISTORY She  reports that she has never smoked. She has never used smokeless tobacco. She reports that she does not drink alcohol or use drugs.   MEDICARE WELLNESS OBJECTIVES: Physical activity: Current Exercise Habits: The patient does not participate in regular exercise at present, Exercise limited by: None identified Cardiac risk factors: Cardiac Risk Factors include: advanced age (>24men, >60 women);hypertension;dyslipidemia;obesity (BMI >30kg/m2);sedentary lifestyle Depression/mood screen:   Depression screen [facility] 2/9 07/05/2017  Decreased Interest 0  Down, Depressed, Hopeless 0  PHQ - 2 Score 0    ADLs:  In your present state of health, do you have any difficulty performing the following activities: 07/05/2017 04/01/2017   Hearing? N N  Vision? N N  Difficulty concentrating or making decisions? N N  Walking or climbing stairs? N N  Dressing or bathing? N N  Doing errands, shopping? N N  Some recent data might be hidden     Cognitive Testing  Alert? Yes  Normal Appearance?Yes  Oriented to person? Yes  Place? Yes   Time? Yes  Recall of three objects?  Yes  Can perform simple calculations? Yes  Displays appropriate judgment?Yes  Can read the correct time from a watch face?Yes  EOL planning: Does Patient Have a Medical Advance Directive?: No Would patient like information on creating a medical advance directive?: Yes (MAU/Ambulatory/Procedural Areas - Information given)  Review of Systems  Constitutional: Negative for malaise/fatigue and weight loss.  HENT: Negative for hearing loss and tinnitus.   Eyes: Negative for blurred vision and double vision.  Respiratory: Negative for cough, sputum production, shortness of breath and wheezing.   Cardiovascular: Negative for chest pain, palpitations, orthopnea, claudication, leg swelling and PND.  Gastrointestinal:  Negative for abdominal pain, blood in stool, constipation, diarrhea, heartburn, melena, nausea and vomiting.  Genitourinary: Negative.   Musculoskeletal: Negative for falls, joint pain and myalgias.  Skin: Negative for rash.  Neurological: Negative for dizziness, tingling, sensory change, weakness and headaches.  Endo/Heme/Allergies: Negative for polydipsia.  Psychiatric/Behavioral: Negative for depression, memory loss, substance abuse and suicidal ideas. The patient is nervous/anxious and has insomnia.   All other systems reviewed and are negative.    Objective:     Today's Vitals   07/05/17 1544  BP: 110/64  Pulse: 80  Temp: (!) 97.5 F (36.4 C)  SpO2: 96%  Weight: 212 lb (96.2 kg)  Height: 5' 3.5" (1.613 m)   Body mass index is 36.97 kg/m.  General appearance: alert, no distress, WD/WN, female HEENT: normocephalic, sclerae  anicteric, TMs pearly, nares patent, no discharge or erythema, pharynx normal Oral cavity: MMM, no lesions Neck: supple, no lymphadenopathy, no thyromegaly, no masses Heart: RRR, normal S1, S2, no murmurs Lungs: CTA bilaterally, no wheezes, rhonchi, or rales Abdomen: +bs, soft, non tender, non distended, no masses, no hepatomegaly, no splenomegaly Musculoskeletal: nontender, no swelling, no obvious deformity Extremities: no edema, no cyanosis, no clubbing Pulses: 2+ symmetric, upper and lower extremities, normal cap refill Neurological: alert, oriented x 3, CN2-12 intact, strength normal upper extremities and lower extremities, sensation normal throughout, DTRs 2+ throughout, no cerebellar signs, gait normal Psychiatric: normal affect, behavior normal, pleasant   Medicare Attestation I have personally reviewed: The patient's medical and social history Their use of alcohol, tobacco or illicit drugs Their current medications and supplements The patient's functional ability including ADLs,fall risks, home safety risks, cognitive, and hearing and visual impairment Diet and physical activities Evidence for depression or mood disorders  The patient's weight, height, BMI, and visual acuity have been recorded in the chart.  I have made referrals, counseling, and provided education to the patient based on review of the above and I have provided the patient with a written personalized care plan for preventive services.     Izora Ribas, NP   07/05/2017

## 2017-07-05 ENCOUNTER — Encounter: Payer: Self-pay | Admitting: Adult Health

## 2017-07-05 ENCOUNTER — Ambulatory Visit (INDEPENDENT_AMBULATORY_CARE_PROVIDER_SITE_OTHER): Payer: Medicare Other | Admitting: Adult Health

## 2017-07-05 VITALS — BP 110/64 | HR 80 | Temp 97.5°F | Ht 63.5 in | Wt 212.0 lb

## 2017-07-05 DIAGNOSIS — R7303 Prediabetes: Secondary | ICD-10-CM | POA: Diagnosis not present

## 2017-07-05 DIAGNOSIS — Z79899 Other long term (current) drug therapy: Secondary | ICD-10-CM

## 2017-07-05 DIAGNOSIS — I10 Essential (primary) hypertension: Secondary | ICD-10-CM | POA: Diagnosis not present

## 2017-07-05 DIAGNOSIS — F419 Anxiety disorder, unspecified: Secondary | ICD-10-CM

## 2017-07-05 DIAGNOSIS — E782 Mixed hyperlipidemia: Secondary | ICD-10-CM

## 2017-07-05 DIAGNOSIS — E559 Vitamin D deficiency, unspecified: Secondary | ICD-10-CM

## 2017-07-05 DIAGNOSIS — Z170 Estrogen receptor positive status [ER+]: Secondary | ICD-10-CM

## 2017-07-05 DIAGNOSIS — C50411 Malignant neoplasm of upper-outer quadrant of right female breast: Secondary | ICD-10-CM | POA: Diagnosis not present

## 2017-07-05 DIAGNOSIS — Z0000 Encounter for general adult medical examination without abnormal findings: Secondary | ICD-10-CM | POA: Diagnosis not present

## 2017-07-05 DIAGNOSIS — Z8249 Family history of ischemic heart disease and other diseases of the circulatory system: Secondary | ICD-10-CM | POA: Diagnosis not present

## 2017-07-05 DIAGNOSIS — Z9884 Bariatric surgery status: Secondary | ICD-10-CM

## 2017-07-05 NOTE — Patient Instructions (Addendum)
Aim for 7+ servings of fruits and vegetables daily  80+ fluid ounces of water or unsweet tea for healthy kidneys  Limit to 1 drink of alcohol per day max, avoid smoking  Limit animal fats in diet for cholesterol and heart health - choose grass fed whenever available  Aim for low stress - take time to unwind and care for your mental health  Aim for 150 min of moderate intensity exercise weekly for heart health, and weights twice weekly for bone health  Aim for 7-9 hours of sleep daily    Drink 1/2 your body weight in fluid ounces of water daily; drink a tall glass of water 30 min before meals  Don't eat until you're stuffed- listen to your stomach and eat until you are 80% full   Try eating off of a salad plate; wait 10 min after finishing before going back for seconds  Start by eating the vegetables on your plate; aim for 50% of your meals to be fruits or vegetables  Then eat your protein - lean meats (grass fed if possible), fish, beans, nuts in moderation  Eat your carbs/starch last ONLY if you still are hungry. If you can, stop before finishing it all  Avoid sugar and flour - the closer it looks to it's original form in nature, typically the better it is for you  Splurge in moderation - "assign" days when you get to splurge and have the "bad stuff" - I like to follow a 80% - 20% plan- "good" choices 80 % of the time, "bad" choices in moderation 20% of the time  Simple equation is: Calories out > calories in = weight loss - even if you eat the bad stuff, if you limit portions, you will still lose weight       When it comes to diets, agreement about the perfect plan isn't easy to find, even among the experts. Experts at the [employer] developed an idea known as the Healthy Eating Plate. Just imagine a plate divided into logical, healthy portions.  The emphasis is on diet quality:  Load up on vegetables and fruits - one-half of your plate: Aim for  color and variety, and remember that potatoes don't count.  Go for whole grains - one-quarter of your plate: Whole wheat, barley, wheat berries, quinoa, oats, brown rice, and foods made with them. If you want pasta, go with whole wheat pasta.  Protein power - one-quarter of your plate: Fish, chicken, beans, and nuts are all healthy, versatile protein sources. Limit red meat.  The diet, however, does go beyond the plate, offering a few other suggestions.  Use healthy plant oils, such as olive, canola, soy, corn, sunflower and peanut. Check the labels, and avoid partially hydrogenated oil, which have unhealthy trans fats.  If you're thirsty, drink water. Coffee and tea are good in moderation, but skip sugary drinks and limit milk and dairy products to one or two daily servings.  The type of carbohydrate in the diet is more important than the amount. Some sources of carbohydrates, such as vegetables, fruits, whole grains, and beans-are healthier than others.  Finally, stay active.

## 2017-07-06 ENCOUNTER — Other Ambulatory Visit: Payer: Self-pay | Admitting: Adult Health

## 2017-07-06 DIAGNOSIS — N179 Acute kidney failure, unspecified: Secondary | ICD-10-CM

## 2017-07-06 LAB — COMPLETE METABOLIC PANEL WITH GFR
AG RATIO: 1.6 (calc) (ref 1.0–2.5)
ALBUMIN MSPROF: 4.2 g/dL (ref 3.6–5.1)
ALT: 21 U/L (ref 6–29)
AST: 17 U/L (ref 10–35)
Alkaline phosphatase (APISO): 65 U/L (ref 33–130)
BUN / CREAT RATIO: 20 (calc) (ref 6–22)
BUN: 24 mg/dL (ref 7–25)
CALCIUM: 9.8 mg/dL (ref 8.6–10.4)
CO2: 28 mmol/L (ref 20–32)
CREATININE: 1.19 mg/dL — AB (ref 0.50–0.99)
Chloride: 105 mmol/L (ref 98–110)
GFR, EST AFRICAN AMERICAN: 55 mL/min/{1.73_m2} — AB (ref >=60)
GFR, EST NON AFRICAN AMERICAN: 48 mL/min/{1.73_m2} — AB (ref >=60)
GLOBULIN: 2.6 g/dL (ref 1.9–3.7)
Glucose, Bld: 105 mg/dL — ABNORMAL HIGH (ref 65–99)
POTASSIUM: 4 mmol/L (ref 3.5–5.3)
SODIUM: 141 mmol/L (ref 135–146)
TOTAL PROTEIN: 6.8 g/dL (ref 6.1–8.1)
Total Bilirubin: 0.4 mg/dL (ref 0.2–1.2)

## 2017-07-06 LAB — TSH: TSH: 2.28 m[IU]/L (ref 0.40–4.50)

## 2017-07-06 LAB — HEMOGLOBIN A1C
EAG (MMOL/L): 6.8 (calc)
Hgb A1c MFr Bld: 5.9 % of total Hgb — ABNORMAL HIGH (ref <5.7)
MEAN PLASMA GLUCOSE: 123 (calc)

## 2017-07-06 LAB — CBC WITH DIFFERENTIAL/PLATELET
BASOS ABS: 49 {cells}/uL (ref 0–200)
Basophils Relative: 0.7 %
EOS ABS: 140 {cells}/uL (ref 15–500)
Eosinophils Relative: 2 %
HEMATOCRIT: 40.6 % (ref 35.0–45.0)
Hemoglobin: 13.6 g/dL (ref 11.7–15.5)
LYMPHS ABS: 1995 {cells}/uL (ref 850–3900)
MCH: 30.7 pg (ref 27.0–33.0)
MCHC: 33.5 g/dL (ref 32.0–36.0)
MCV: 91.6 fL (ref 80.0–100.0)
MPV: 9.3 fL (ref 7.5–12.5)
Monocytes Relative: 8.1 %
NEUTROS PCT: 60.7 %
Neutro Abs: 4249 cells/uL (ref 1500–7800)
PLATELETS: 372 10*3/uL (ref 140–400)
RBC: 4.43 10*6/uL (ref 3.80–5.10)
RDW: 12.3 % (ref 11.0–15.0)
TOTAL LYMPHOCYTE: 28.5 %
WBC: 7 10*3/uL (ref 3.8–10.8)
WBCMIX: 567 {cells}/uL (ref 200–950)

## 2017-07-06 LAB — LIPID PANEL
CHOL/HDL RATIO: 3.8 (calc) (ref <5.0)
Cholesterol: 211 mg/dL — ABNORMAL HIGH (ref <200)
HDL: 55 mg/dL (ref >50)
LDL Cholesterol (Calc): 119 mg/dL (calc) — ABNORMAL HIGH
NON-HDL CHOLESTEROL (CALC): 156 mg/dL — AB (ref <130)
Triglycerides: 260 mg/dL — ABNORMAL HIGH (ref <150)

## 2017-07-14 ENCOUNTER — Ambulatory Visit (INDEPENDENT_AMBULATORY_CARE_PROVIDER_SITE_OTHER): Payer: Medicare Other | Admitting: *Deleted

## 2017-07-14 ENCOUNTER — Other Ambulatory Visit: Payer: Self-pay | Admitting: Internal Medicine

## 2017-07-14 DIAGNOSIS — N3000 Acute cystitis without hematuria: Secondary | ICD-10-CM | POA: Diagnosis not present

## 2017-07-14 MED ORDER — CIPROFLOXACIN HCL 250 MG PO TABS: 250.0000 mg | ORAL_TABLET | Freq: Two times a day (BID) | ORAL | 0 refills | Status: AC

## 2017-07-14 NOTE — Progress Notes (Signed)
Patient is here for a NV to check a UA and a urine culture.  She states she is having burning when she urinates, frequency and urgency x 3-4 days. She noticed blood in her urine this morning. She complained of bladder spams and back pain. An RX was sent in to her pharmacy for Cipro 250 mg by Dr Melford Aase.

## 2017-07-16 LAB — URINALYSIS, ROUTINE W REFLEX MICROSCOPIC
BACTERIA UA: NONE SEEN /HPF
Bilirubin Urine: NEGATIVE
Glucose, UA: NEGATIVE
HYALINE CAST: NONE SEEN /LPF
KETONES UR: NEGATIVE
Leukocytes, UA: NEGATIVE
Nitrite: NEGATIVE
Protein, ur: NEGATIVE
SQUAMOUS EPITHELIAL / LPF: NONE SEEN /HPF (ref <=5)
Specific Gravity, Urine: 1.009 (ref 1.001–1.03)
WBC, UA: NONE SEEN /HPF (ref 0–5)
pH: 7.5 (ref 5.0–8.0)

## 2017-07-16 LAB — URINE CULTURE
MICRO NUMBER: 90795624
SPECIMEN QUALITY: ADEQUATE

## 2017-07-17 ENCOUNTER — Other Ambulatory Visit: Payer: Self-pay | Admitting: Internal Medicine

## 2017-07-17 DIAGNOSIS — N390 Urinary tract infection, site not specified: Secondary | ICD-10-CM

## 2017-07-17 DIAGNOSIS — R319 Hematuria, unspecified: Principal | ICD-10-CM

## 2017-07-23 ENCOUNTER — Encounter: Payer: Self-pay | Admitting: Internal Medicine

## 2017-07-23 ENCOUNTER — Other Ambulatory Visit: Payer: Self-pay | Admitting: Internal Medicine

## 2017-07-23 DIAGNOSIS — B379 Candidiasis, unspecified: Secondary | ICD-10-CM

## 2017-07-23 DIAGNOSIS — R319 Hematuria, unspecified: Principal | ICD-10-CM

## 2017-07-23 DIAGNOSIS — N3000 Acute cystitis without hematuria: Secondary | ICD-10-CM

## 2017-07-23 DIAGNOSIS — N390 Urinary tract infection, site not specified: Secondary | ICD-10-CM

## 2017-07-23 MED ORDER — FLUCONAZOLE 150 MG PO TABS: ORAL_TABLET | ORAL | 0 refills | Status: DC

## 2017-07-23 MED ORDER — NITROFURANTOIN MONOHYD MACRO 100 MG PO CAPS: ORAL_CAPSULE | ORAL | 0 refills | Status: DC

## 2017-07-28 ENCOUNTER — Other Ambulatory Visit: Payer: Self-pay | Admitting: Adult Health

## 2017-08-10 ENCOUNTER — Other Ambulatory Visit: Payer: Medicare Other

## 2017-08-10 DIAGNOSIS — N3000 Acute cystitis without hematuria: Secondary | ICD-10-CM

## 2017-08-10 DIAGNOSIS — N179 Acute kidney failure, unspecified: Secondary | ICD-10-CM | POA: Diagnosis not present

## 2017-08-10 LAB — BASIC METABOLIC PANEL WITH GFR
BUN: 16 mg/dL (ref 7–25)
CALCIUM: 9.1 mg/dL (ref 8.6–10.4)
CO2: 24 mmol/L (ref 20–32)
Chloride: 105 mmol/L (ref 98–110)
Creat: 0.96 mg/dL (ref 0.50–0.99)
GFR, EST AFRICAN AMERICAN: 72 mL/min/{1.73_m2} (ref >=60)
GFR, Est Non African American: 62 mL/min/{1.73_m2} (ref >=60)
Glucose, Bld: 98 mg/dL (ref 65–99)
POTASSIUM: 3.9 mmol/L (ref 3.5–5.3)
SODIUM: 139 mmol/L (ref 135–146)

## 2017-08-12 LAB — URINALYSIS, ROUTINE W REFLEX MICROSCOPIC
Bacteria, UA: NONE SEEN /HPF
Bilirubin Urine: NEGATIVE
GLUCOSE, UA: NEGATIVE
Hyaline Cast: NONE SEEN /LPF
Ketones, ur: NEGATIVE
Nitrite: NEGATIVE
Protein, ur: NEGATIVE
Specific Gravity, Urine: 1.009 (ref 1.001–1.03)
Squamous Epithelial / LPF: NONE SEEN /HPF (ref <=5)
pH: 7 (ref 5.0–8.0)

## 2017-08-12 LAB — URINE CULTURE
MICRO NUMBER: 90902030
SPECIMEN QUALITY:: ADEQUATE

## 2017-08-17 ENCOUNTER — Other Ambulatory Visit: Payer: Self-pay | Admitting: Internal Medicine

## 2017-08-17 DIAGNOSIS — E782 Mixed hyperlipidemia: Secondary | ICD-10-CM

## 2017-09-09 ENCOUNTER — Other Ambulatory Visit: Payer: Self-pay | Admitting: Internal Medicine

## 2017-09-21 ENCOUNTER — Other Ambulatory Visit: Payer: Self-pay | Admitting: Oncology

## 2017-10-05 ENCOUNTER — Encounter: Payer: Self-pay | Admitting: Internal Medicine

## 2017-10-05 ENCOUNTER — Ambulatory Visit (INDEPENDENT_AMBULATORY_CARE_PROVIDER_SITE_OTHER): Payer: Medicare Other | Admitting: Internal Medicine

## 2017-10-05 VITALS — BP 130/64 | HR 75 | Temp 97.8°F | Ht 63.5 in | Wt 212.0 lb

## 2017-10-05 DIAGNOSIS — E559 Vitamin D deficiency, unspecified: Secondary | ICD-10-CM

## 2017-10-05 DIAGNOSIS — E1122 Type 2 diabetes mellitus with diabetic chronic kidney disease: Secondary | ICD-10-CM | POA: Diagnosis not present

## 2017-10-05 DIAGNOSIS — N182 Chronic kidney disease, stage 2 (mild): Secondary | ICD-10-CM | POA: Diagnosis not present

## 2017-10-05 DIAGNOSIS — I10 Essential (primary) hypertension: Secondary | ICD-10-CM | POA: Diagnosis not present

## 2017-10-05 DIAGNOSIS — Z23 Encounter for immunization: Secondary | ICD-10-CM | POA: Diagnosis not present

## 2017-10-05 DIAGNOSIS — E782 Mixed hyperlipidemia: Secondary | ICD-10-CM | POA: Diagnosis not present

## 2017-10-05 DIAGNOSIS — Z79899 Other long term (current) drug therapy: Secondary | ICD-10-CM

## 2017-10-05 NOTE — Patient Instructions (Signed)

## 2017-10-05 NOTE — Progress Notes (Signed)
This very nice 67 y.o. MWF presents for 6 month follow up with HTN, HLD, T2_DM/Pre-Diabetes and Vitamin D Deficiency.      In 2016 , she Had Rt Lumpectomy for an ER(+) Breast Ca by Dr Lucia Gaskins followed by Radiation by Dr Pablo Ledger and since followed by Dr [name] on Anastrozole.        Patient is treated for HTN (1995) & BP has been controlled at home. Today's BP is at goal - 130/64. Patient has had no complaints of any cardiac type chest pain, palpitations, dyspnea / orthopnea / PND, dizziness, claudication, or dependent edema.     Hyperlipidemia is not controlled with diet & Zetia with alleged severe intolerance to Lipitor. Patient denies myalgias or other med SE's. Last Lipids were not at goal: Lab Results  Component Value Date   CHOL 209 (H) 10/05/2017   HDL 50 (L) 10/05/2017   LDLCALC 115 (H) 10/05/2017   TRIG 331 (H) 10/05/2017   CHOLHDL 4.2 10/05/2017      Also, the patient has history of  PreDiabetes (2008) and underwent Gastric Lap Band Bariatric surg in 2011 losing from 229# down to 186# and sadly had regained back to her current wt of 212#. She was treated w/MF from 2012-2016 with wt loss she weaned off meds. She still remains off of meds despite her weight gain.  She denies symptoms of reactive hypoglycemia, diabetic polys, paresthesias or visual blurring.  Last A1c was near goal: Lab Results  Component Value Date   HGBA1C 5.9 (H) 10/05/2017      Further, the patient also has history of Vitamin D Deficiency ("28"/2008)  and supplements vitamin D without any suspected side-effects. Last vitamin D was at goal:  Lab Results  Component Value Date   VD25OH 75 10/05/2017   Current Outpatient Medications on File Prior to Visit  Medication Sig  . acidophilus (RISAQUAD) CAPS Take 1 capsule by mouth daily. Reported on 02/19/2015  . ALPRAZolam (XANAX) 1 MG tablet TAKE 1/2 TO 1 TABLET BY MOUTH 2 TO 3 TIMES DAILY ONLY IF NEEDED FOR SEVERE ANXIETY AND PLEASE LIMIT TO 5 DAYS/ WEEK TO  AVOID ADDICTION  . anastrozole (ARIMIDEX) 1 MG tablet TAKE 1 TABLET BY MOUTH EVERY DAY  . aspirin 81 MG tablet Take 81 mg by mouth daily.  Marland Kitchen atenolol (TENORMIN) 100 MG tablet TAKE 1 TABLET BY MOUTH EVERY DAY  . b complex vitamins capsule Take 1 capsule by mouth daily.  . calcium carbonate (TUMS) 500 MG chewable tablet Chew 1,000 mg by mouth at bedtime.   . Cholecalciferol (VITAMIN D3) 5000 UNITS CAPS Take 5,000 Units by mouth 2 (two) times daily.   . Cinnamon 500 MG TABS Take 1 tablet by mouth daily. Cinnamon 1000 mg, 1 tab in morning and 1 tab evening   . diclofenac sodium (VOLTAREN) 1 % GEL Apply 4 g topically 4 (four) times daily.  Marland Kitchen ezetimibe (ZETIA) 10 MG tablet TAKE 1 TABLET BY MOUTH DAILY FOR CHOLESTEROL  . fluconazole (DIFLUCAN) 150 MG tablet Take 1 tablet weekly if needed for yeast infection  . Magnesium 250 MG TABS Take 250 mg by mouth 2 (two) times daily.  . Multiple Vitamins-Minerals (MULTIVITAMIN WITH MINERALS) tablet Take 1 tablet by mouth 2 (two) times daily.  . Omega-3 Fatty Acids (FISH OIL) 1000 MG CAPS Take 1,000 mg by mouth daily.  Marland Kitchen OVER THE COUNTER MEDICATION Take 1 tablet by mouth 2 (two) times daily. Tumeric with black pepper  .  verapamil (CALAN) 80 MG tablet TAKE 1 TABLET BY MOUTH TWICE DAILY FOR BLOOD PRESSURE   No current facility-administered medications on file prior to visit.    Allergies  Allergen Reactions  . Codeine Itching  . Lipitor [Atorvastatin] Other (See Comments)    Severe fatigue. ? memory loss.  . Ace Inhibitors     cough   PMHx:   Past Medical History:  Diagnosis Date  . Allergy    SEASONAL  . Anxiety   . At risk for difficult insertion of breathing tube   . Breast cancer ([place])   . Breast cancer of upper-outer quadrant of right female breast ([place]) 12/03/2014  . 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 GERD (gastroesophageal reflux disease)    hx - resolved with lap band surgery  . H/O hiatal hernia    resolved with band surgery    . Hyperlipidemia   . Hypertension   . Insomnia    tx with zanax  . 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 Type II or unspecified type diabetes mellitus without mention of complication, not stated as uncontrolled 12/14/2012   no meds, diet controlled   Immunization History  Administered Date(s) Administered  . DT 12/18/2013  . DTaP 01/12/2002  . Influenza Split 12/14/2012, 11/19/2013, 10/27/2014  . Influenza-Unspecified 10/22/2015, 10/25/2016  . PPD Test 12/14/2012, 12/18/2013, 01/24/2015, 02/20/2016  . Pneumococcal Conjugate-13 10/09/2013  . Pneumococcal Polysaccharide-23 01/13/1992, 03/31/2017   Past Surgical History:  Procedure Laterality Date  . APPENDECTOMY  08/06/12   lap appy  . BACK SURGERY     cervical  . BREAST LUMPECTOMY WITH NEEDLE LOCALIZATION AND AXILLARY SENTINEL LYMPH NODE BX Right 12/28/2014   Procedure: RIGHT BREAST LUMPECTOMY WITH TWO (2) NEEDLE LOCALIZATION AND AXILLARY SENTINEL LYMPH NODE BX;  Surgeon: Alphonsa Overall, MD;  Location: [facility];  Service: General;  Laterality: Right;  . CESAREAN SECTION     x 1  . CHOLECYSTECTOMY    . COLONOSCOPY  09/29/2004   normal  . DIAGNOSTIC LAPAROSCOPY    . HERNIA REPAIR  2008  . LAPAROSCOPIC APPENDECTOMY N/A 08/06/2012   Procedure: APPENDECTOMY LAPAROSCOPIC;  Surgeon: Earnstine Regal, MD;  Location: [facility];  Service: General;  Laterality: N/A;  . LAPAROSCOPIC GASTRIC BANDING WITH HIATAL HERNIA REPAIR N/A Nov 2008  . TUBAL LIGATION     FHx:    Reviewed / unchanged  SHx:    Reviewed / unchanged   Systems Review:  Constitutional: Denies fever, chills, wt changes, headaches, insomnia, fatigue, night sweats, change in appetite. Eyes: Denies redness, blurred vision, diplopia, discharge, itchy, watery eyes.  ENT: Denies discharge, congestion, post nasal drip, epistaxis, sore throat, earache, hearing loss, dental pain, tinnitus, vertigo, sinus pain, snoring.  CV: Denies chest pain, palpitations,  irregular heartbeat, syncope, dyspnea, diaphoresis, orthopnea, PND, claudication or edema. Respiratory: denies cough, dyspnea, DOE, pleurisy, hoarseness, laryngitis, wheezing.  Gastrointestinal: Denies dysphagia, odynophagia, heartburn, reflux, water brash, abdominal pain or cramps, nausea, vomiting, bloating, diarrhea, constipation, hematemesis, melena, hematochezia  or hemorrhoids. Genitourinary: Denies dysuria, frequency, urgency, nocturia, hesitancy, discharge, hematuria or flank pain. Musculoskeletal: Denies arthralgias, myalgias, stiffness, jt. swelling, pain, limping or strain/sprain.  Skin: Denies pruritus, rash, hives, warts, acne, eczema or change in skin lesion(s). Neuro: No weakness, tremor, incoordination, spasms, paresthesia or pain. Psychiatric: Denies confusion, memory loss or sensory loss. Endo: Denies change in weight, skin or hair change.  Heme/Lymph: No excessive bleeding, bruising or enlarged lymph nodes.  Physical Exam  BP  130/64   Pulse 75   Temp 97.8 F (36.6 C)   Ht 5' 3.5" (1.613 m)   Wt 212 lb (96.2 kg)   SpO2 96%   BMI 36.97 kg/m   Appears over nourished, well groomed  and in no distress.  Eyes: PERRLA, EOMs, conjunctiva no swelling or erythema. Sinuses: No frontal/maxillary tenderness ENT/Mouth: EAC's clear, TM's nl w/o erythema, bulging. Nares clear w/o erythema, swelling, exudates. Oropharynx clear without erythema or exudates. Oral hygiene is good. Tongue normal, non obstructing. Hearing intact.  Neck: Supple. Thyroid not palpable. Car 2+/2+ without bruits, nodes or JVD. Chest: Respirations nl with BS clear & equal w/o rales, rhonchi, wheezing or stridor.  Cor: Heart sounds normal w/ regular rate and rhythm without sig. murmurs, gallops, clicks or rubs. Peripheral pulses normal and equal  without edema.  Abdomen: Soft & bowel sounds normal. Non-tender w/o guarding, rebound, hernias, masses or organomegaly.  Lymphatics: Unremarkable.  Musculoskeletal:  Full ROM all peripheral extremities, joint stability, 5/5 strength and normal gait.  Skin: Warm, dry without exposed rashes, lesions or ecchymosis apparent.  Neuro: Cranial nerves intact, reflexes equal bilaterally. Sensory-motor testing grossly intact. Tendon reflexes grossly intact.  Pysch: Alert & oriented x 3.  Insight and judgement nl & appropriate. No ideations.  Assessment and Plan:  1. Essential hypertension  - Continue medication, monitor blood pressure at home.  - Continue DASH diet.  Reminder to go to the ER if any CP,  SOB, nausea, dizziness, severe HA, changes vision/speech.  - CBC with Differential/Platelet - COMPLETE METABOLIC PANEL WITH GFR - Magnesium - TSH  2. Hyperlipidemia, mixed  - Continue diet/meds, exercise,& lifestyle modifications.  - Continue monitor periodic cholesterol/liver & renal functions   - Lipid panel - TSH  3. 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  - Monitor appropriate labs.  - Hemoglobin A1c - Insulin, random  4. Vitamin D deficiency  - Continue supplementation.   - VITAMIN D 25 Hydroxyl  5. Medication management  - CBC with Differential/Platelet - COMPLETE METABOLIC PANEL WITH GFR - Magnesium - Lipid panel - TSH - Hemoglobin A1c - Insulin, random - VITAMIN D 25 Hydroxyl  6. Needs flu shot  - Flu vaccine HIGH DOSE PF      Discussed  regular exercise, BP monitoring, weight control to achieve/maintain BMI less than 25 and discussed med and SE's. Recommended labs to assess and monitor clinical status with further disposition pending results of labs. Over 30 minutes of exam, counseling, chart review was performed.

## 2017-10-06 ENCOUNTER — Other Ambulatory Visit: Payer: Self-pay | Admitting: Internal Medicine

## 2017-10-06 LAB — CBC WITH DIFFERENTIAL/PLATELET
BASOS PCT: 0.5 %
Basophils Absolute: 32 cells/uL (ref 0–200)
EOS PCT: 1.1 %
Eosinophils Absolute: 70 cells/uL (ref 15–500)
HCT: 38.7 % (ref 35.0–45.0)
Hemoglobin: 13.1 g/dL (ref 11.7–15.5)
Lymphs Abs: 1990 cells/uL (ref 850–3900)
MCH: 31.3 pg (ref 27.0–33.0)
MCHC: 33.9 g/dL (ref 32.0–36.0)
MCV: 92.4 fL (ref 80.0–100.0)
MONOS PCT: 7.1 %
MPV: 9.3 fL (ref 7.5–12.5)
Neutro Abs: 3853 cells/uL (ref 1500–7800)
Neutrophils Relative %: 60.2 %
PLATELETS: 366 10*3/uL (ref 140–400)
RBC: 4.19 10*6/uL (ref 3.80–5.10)
RDW: 12.5 % (ref 11.0–15.0)
TOTAL LYMPHOCYTE: 31.1 %
WBC: 6.4 10*3/uL (ref 3.8–10.8)
WBCMIX: 454 {cells}/uL (ref 200–950)

## 2017-10-06 LAB — COMPLETE METABOLIC PANEL WITH GFR
AG Ratio: 2 (calc) (ref 1.0–2.5)
ALT: 19 U/L (ref 6–29)
AST: 15 U/L (ref 10–35)
Albumin: 4.1 g/dL (ref 3.6–5.1)
Alkaline phosphatase (APISO): 57 U/L (ref 33–130)
BILIRUBIN TOTAL: 0.3 mg/dL (ref 0.2–1.2)
BUN / CREAT RATIO: 16 (calc) (ref 6–22)
BUN: 17 mg/dL (ref 7–25)
CALCIUM: 9.6 mg/dL (ref 8.6–10.4)
CHLORIDE: 106 mmol/L (ref 98–110)
CO2: 28 mmol/L (ref 20–32)
Creat: 1.09 mg/dL — ABNORMAL HIGH (ref 0.50–0.99)
GFR, EST AFRICAN AMERICAN: 61 mL/min/{1.73_m2} (ref >=60)
GFR, Est Non African American: 53 mL/min/{1.73_m2} — ABNORMAL LOW (ref >=60)
GLUCOSE: 121 mg/dL — AB (ref 65–99)
Globulin: 2.1 g/dL (calc) (ref 1.9–3.7)
POTASSIUM: 3.8 mmol/L (ref 3.5–5.3)
Sodium: 141 mmol/L (ref 135–146)
TOTAL PROTEIN: 6.2 g/dL (ref 6.1–8.1)

## 2017-10-06 LAB — HEMOGLOBIN A1C
HEMOGLOBIN A1C: 5.9 %{Hb} — AB (ref <5.7)
Mean Plasma Glucose: 123 (calc)
eAG (mmol/L): 6.8 (calc)

## 2017-10-06 LAB — LIPID PANEL
CHOL/HDL RATIO: 4.2 (calc) (ref <5.0)
Cholesterol: 209 mg/dL — ABNORMAL HIGH (ref <200)
HDL: 50 mg/dL — ABNORMAL LOW (ref >50)
LDL Cholesterol (Calc): 115 mg/dL (calc) — ABNORMAL HIGH
NON-HDL CHOLESTEROL (CALC): 159 mg/dL — AB (ref <130)
Triglycerides: 331 mg/dL — ABNORMAL HIGH (ref <150)

## 2017-10-06 LAB — VITAMIN D 25 HYDROXY (VIT D DEFICIENCY, FRACTURES): Vit D, 25-Hydroxy: 85 ng/mL (ref 30–100)

## 2017-10-06 LAB — MAGNESIUM: Magnesium: 2 mg/dL (ref 1.5–2.5)

## 2017-10-06 LAB — INSULIN, RANDOM: Insulin: 21.4 u[IU]/mL — ABNORMAL HIGH (ref 2.0–19.6)

## 2017-10-06 LAB — TSH: TSH: 2.58 mIU/L (ref 0.40–4.50)

## 2017-10-06 MED ORDER — ROSUVASTATIN CALCIUM 40 MG PO TABS: ORAL_TABLET | ORAL | 1 refills | Status: DC

## 2017-10-09 ENCOUNTER — Encounter: Payer: Self-pay | Admitting: Internal Medicine

## 2017-10-14 ENCOUNTER — Other Ambulatory Visit: Payer: Self-pay | Admitting: Internal Medicine

## 2017-10-14 DIAGNOSIS — M5440 Lumbago with sciatica, unspecified side: Secondary | ICD-10-CM

## 2017-10-14 MED ORDER — GABAPENTIN 300 MG PO CAPS: ORAL_CAPSULE | ORAL | 2 refills | Status: DC

## 2017-10-20 ENCOUNTER — Other Ambulatory Visit: Payer: Self-pay | Admitting: Internal Medicine

## 2017-11-30 ENCOUNTER — Ambulatory Visit
Admission: RE | Admit: 2017-11-30 | Discharge: 2017-11-30 | Disposition: A | Discharge status: Home / Self Care | Payer: Medicare Other | Source: Ambulatory Visit | Attending: Oncology | Admitting: Oncology

## 2017-11-30 DIAGNOSIS — Z13820 Encounter for screening for osteoporosis: Secondary | ICD-10-CM | POA: Diagnosis not present

## 2017-11-30 DIAGNOSIS — C50411 Malignant neoplasm of upper-outer quadrant of right female breast: Secondary | ICD-10-CM

## 2017-11-30 DIAGNOSIS — Z853 Personal history of malignant neoplasm of breast: Secondary | ICD-10-CM | POA: Diagnosis not present

## 2017-11-30 DIAGNOSIS — Z780 Asymptomatic menopausal state: Secondary | ICD-10-CM | POA: Diagnosis not present

## 2017-11-30 DIAGNOSIS — Z170 Estrogen receptor positive status [ER+]: Principal | ICD-10-CM

## 2017-11-30 DIAGNOSIS — R928 Other abnormal and inconclusive findings on diagnostic imaging of breast: Secondary | ICD-10-CM | POA: Diagnosis not present

## 2017-11-30 HISTORY — DX: Personal history of irradiation: Z92.3

## 2017-12-02 ENCOUNTER — Inpatient Hospital Stay: Payer: Medicare Other | Attending: Oncology | Admitting: Oncology

## 2017-12-02 ENCOUNTER — Other Ambulatory Visit: Payer: Self-pay | Admitting: *Deleted

## 2017-12-02 ENCOUNTER — Other Ambulatory Visit: Payer: Self-pay | Admitting: Internal Medicine

## 2017-12-02 ENCOUNTER — Inpatient Hospital Stay: Payer: Medicare Other

## 2017-12-02 VITALS — BP 130/74 | HR 97 | Temp 97.7°F | Resp 18 | Ht 63.5 in | Wt 212.0 lb

## 2017-12-02 DIAGNOSIS — Z79899 Other long term (current) drug therapy: Secondary | ICD-10-CM | POA: Insufficient documentation

## 2017-12-02 DIAGNOSIS — N182 Chronic kidney disease, stage 2 (mild): Secondary | ICD-10-CM | POA: Diagnosis not present

## 2017-12-02 DIAGNOSIS — I129 Hypertensive chronic kidney disease with stage 1 through stage 4 chronic kidney disease, or unspecified chronic kidney disease: Secondary | ICD-10-CM | POA: Insufficient documentation

## 2017-12-02 DIAGNOSIS — E785 Hyperlipidemia, unspecified: Secondary | ICD-10-CM | POA: Diagnosis not present

## 2017-12-02 DIAGNOSIS — F419 Anxiety disorder, unspecified: Secondary | ICD-10-CM | POA: Diagnosis not present

## 2017-12-02 DIAGNOSIS — Z803 Family history of malignant neoplasm of breast: Secondary | ICD-10-CM | POA: Diagnosis not present

## 2017-12-02 DIAGNOSIS — C50411 Malignant neoplasm of upper-outer quadrant of right female breast: Secondary | ICD-10-CM

## 2017-12-02 DIAGNOSIS — Z170 Estrogen receptor positive status [ER+]: Principal | ICD-10-CM

## 2017-12-02 DIAGNOSIS — Z7982 Long term (current) use of aspirin: Secondary | ICD-10-CM | POA: Insufficient documentation

## 2017-12-02 DIAGNOSIS — Z79811 Long term (current) use of aromatase inhibitors: Secondary | ICD-10-CM | POA: Diagnosis not present

## 2017-12-02 DIAGNOSIS — G4700 Insomnia, unspecified: Secondary | ICD-10-CM | POA: Diagnosis not present

## 2017-12-02 DIAGNOSIS — K219 Gastro-esophageal reflux disease without esophagitis: Secondary | ICD-10-CM | POA: Insufficient documentation

## 2017-12-02 DIAGNOSIS — E1122 Type 2 diabetes mellitus with diabetic chronic kidney disease: Secondary | ICD-10-CM | POA: Diagnosis not present

## 2017-12-02 DIAGNOSIS — Q613 Polycystic kidney, unspecified: Secondary | ICD-10-CM | POA: Insufficient documentation

## 2017-12-02 LAB — COMPREHENSIVE METABOLIC PANEL
ALBUMIN: 3.7 g/dL (ref 3.5–5.0)
ALT: 19 U/L (ref 0–44)
AST: 17 U/L (ref 15–41)
Alkaline Phosphatase: 65 U/L (ref 38–126)
Anion gap: 9 (ref 5–15)
BUN: 14 mg/dL (ref 8–23)
CO2: 26 mmol/L (ref 22–32)
CREATININE: 0.97 mg/dL (ref 0.44–1.00)
Calcium: 9.2 mg/dL (ref 8.9–10.3)
Chloride: 106 mmol/L (ref 98–111)
GFR calc Af Amer: >60 mL/min (ref >60)
GFR, EST NON AFRICAN AMERICAN: 60 mL/min — AB (ref >60)
GLUCOSE: 119 mg/dL — AB (ref 70–99)
POTASSIUM: 3.7 mmol/L (ref 3.5–5.1)
SODIUM: 141 mmol/L (ref 135–145)
Total Bilirubin: 0.4 mg/dL (ref 0.3–1.2)
Total Protein: 6.7 g/dL (ref 6.5–8.1)

## 2017-12-02 LAB — CBC WITH DIFFERENTIAL (CANCER CENTER ONLY)
ABS IMMATURE GRANULOCYTES: 0.03 10*3/uL (ref 0.00–0.07)
BASOS ABS: 0 10*3/uL (ref 0.0–0.1)
BASOS PCT: 1 %
EOS ABS: 0.1 10*3/uL (ref 0.0–0.5)
Eosinophils Relative: 1 %
HCT: 38.9 % (ref 36.0–46.0)
Hemoglobin: 12.9 g/dL (ref 12.0–15.0)
IMMATURE GRANULOCYTES: 0 %
Lymphocytes Relative: 34 %
Lymphs Abs: 2.3 10*3/uL (ref 0.7–4.0)
MCH: 31.2 pg (ref 26.0–34.0)
MCHC: 33.2 g/dL (ref 30.0–36.0)
MCV: 94 fL (ref 80.0–100.0)
MONOS PCT: 7 %
Monocytes Absolute: 0.5 10*3/uL (ref 0.1–1.0)
NEUTROS ABS: 3.8 10*3/uL (ref 1.7–7.7)
NEUTROS PCT: 57 %
NRBC: 0 % (ref 0.0–0.2)
PLATELETS: 339 10*3/uL (ref 150–400)
RBC: 4.14 MIL/uL (ref 3.87–5.11)
RDW: 12.3 % (ref 11.5–15.5)
WBC Count: 6.7 10*3/uL (ref 4.0–10.5)

## 2017-12-02 NOTE — Progress Notes (Signed)
[place]  [phone] Fax:([phone]) [phone]     ID: AUBRIANNE MOLYNEUX DOB: [birth year]  MR#: [id number]  JYN#:[id number]  Patient Care Team: Unk Pinto, MD as PCP - General (Internal Medicine) Alphonsa Overall, MD as Consulting Physician (General Surgery) Roselie Cirigliano, Virgie Dad, MD as Consulting Physician (Oncology) Thea Silversmith, MD as Consulting Physician (Radiation Oncology) Sylvan Cheese, NP as Nurse Practitioner (Hematology and Oncology) OTHER MD:  CHIEF COMPLAINT: Estrogen receptor positive breast cancer  CURRENT TREATMENT:  anastrozole   BREAST CANCER HISTORY: From the original intake note:  Daissy's had routine bilateral digital screening mammography 11/16/2014 at the [facility] Center, showing a possible mass in the right breast. Right diagnostic mammography with tomosynthesis and right breast ultrasonography 11/27/2014 showed the breast density to be category B. There was a persistent spiculated mass in the upper outer right breast measuring 6 mm. Posterior to that there was a second mass measuring 1.6 cm. There was no palpable mass by exam. Ultrasonography confirmed a 0.6 cm irregular hypoechoic mass in the right breast at 10:00 7 cm from the nipple and another measuring 0.6 cm also in the 10:00 location but 9 cm from the nipple. The 2 masses were separated by approximately 2.9 cm. The axilla was sonographically benign.  On 11/28/2014 the patient underwent biopsy of the 2 masses in question the more posterior mass at 9 cm from the nipple was an invasive ductal carcinoma, grade 1, estrogen receptor 100% positive, progesterone receptor 85% positive, with an MIB-1 of 10% and no HER-2 amplification, the signals ratio being 1.41 and the number per cell 2.05.  The more anterior mass (measuring 0.6 cm) also showed an invasive ductal carcinoma, grade 1, estrogen receptor 100% positive, but progesterone receptor negative. The MIB-1 was 5%. This was also HER-2  negative, with a signals ratio 1.29 and the number per cell 2.00.  The patient's subsequent history is as detailed below  INTERVAL HISTORY: Dayannara returns today for follow-up and treatment of her estrogen receptor positive breast cancer.  The patient continues on anastrozole, and is tolerating it well. She is experiencing no hot flashes and mild vaginal dryness.   Since her last visit here, she underwent a Bone Density scan on 11/30/2017 showing a T-score of -0.3, which is considered normal.   On that same day she also underwent a Diagnostic Bilateral breast mammogram with CAD and TOMO, breast density category B, showing no evidence of malignancy in either breast. Lumpectomy changes on the right.   REVIEW OF SYSTEMS:  Tamaira is well overall. 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 She saw Dr. Melford Aase for treatment for pain. Dr. Melford Aase gave her Gabapentin, which she did not tolerate well. She felt "loopy" on the medication. Over the summer of 2019, she experienced a rather bad Urinary Tract Infection and received an antibiotic and yeast infection from Dr. Melford Aase.  The patient denies unusual headaches, visual changes, nausea, vomiting, or dizziness. There has been no unusual cough, phlegm production, or pleurisy. This been no change in bowel habits. The patient denies unexplained fatigue or unexplained weight loss, bleeding, rash, or fever. A detailed review of systems was otherwise noncontributory.    PAST MEDICAL HISTORY: Past Medical History:  Diagnosis Date  . Allergy    SEASONAL  . Anxiety   . At risk for difficult insertion of breathing tube   . Breast cancer ([place])   .  Breast cancer of upper-outer quadrant of right female breast ([place]) 12/03/2014  . 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 GERD (gastroesophageal reflux disease)    hx - resolved with lap band surgery  . H/O hiatal hernia      resolved with band surgery  . Hyperlipidemia   . Hypertension   . Insomnia    tx with zanax  . Personal history of radiation therapy   . 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 Type II or unspecified type diabetes mellitus without mention of complication, not stated as uncontrolled 12/14/2012   no meds, diet controlled    PAST SURGICAL HISTORY: Past Surgical History:  Procedure Laterality Date  . APPENDECTOMY  08/06/12   lap appy  . BACK SURGERY     cervical  . BREAST BIOPSY    . BREAST LUMPECTOMY Right 2016  . BREAST LUMPECTOMY WITH NEEDLE LOCALIZATION AND AXILLARY SENTINEL LYMPH NODE BX Right 12/28/2014   Procedure: RIGHT BREAST LUMPECTOMY WITH TWO (2) NEEDLE LOCALIZATION AND AXILLARY SENTINEL LYMPH NODE BX;  Surgeon: Alphonsa Overall, MD;  Location: [facility];  Service: General;  Laterality: Right;  . CESAREAN SECTION     x 1  . CHOLECYSTECTOMY    . COLONOSCOPY  09/29/2004   normal  . DIAGNOSTIC LAPAROSCOPY    . HERNIA REPAIR  2008  . LAPAROSCOPIC APPENDECTOMY N/A 08/06/2012   Procedure: APPENDECTOMY LAPAROSCOPIC;  Surgeon: Earnstine Regal, MD;  Location: [facility];  Service: General;  Laterality: N/A;  . LAPAROSCOPIC GASTRIC BANDING WITH HIATAL HERNIA REPAIR N/A Nov 2008  . TUBAL LIGATION      FAMILY HISTORY Family History  Problem Relation Age of Onset  . Cirrhosis Mother   . Alcohol abuse Mother   . Liver disease Mother   . Diabetes Father   . Hypertension Father   . Stroke Father   . Heart disease Father   . AAA (abdominal aortic aneurysm) Sister   . Cancer Cousin        Breast  . Breast cancer Cousin        x3  . Colon cancer Neg Hx   . Rectal cancer Neg Hx   . Stomach cancer Neg Hx    the patient's father died from a stroke at age 71. The patient's mother died from cirrhosis of the liver at age 33. The patient had no brothers. She had 2 sisters, one of whom died from a ruptured aortic aneurysm. There is no history of breast or ovarian  cancer in the family to the patient's knowledge  GYNECOLOGIC HISTORY:  No LMP recorded. Patient is postmenopausal. Menarche age 69, first live birth age 45. The patient is GX P1. She stopped having periods in 2002. She did not take hormone replacement. She used oral contraceptives for 11 years remotely, with no complications.  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 Her husband Mikki Santee is self-employed as a Herbalist area did daughter Warden/ranger lives in [place]. She is working there full-time and the patient tells me her daughter recently has had multiple promotions.  She is divorced. The patient's grand son, 29years old, lives in [place] with the child's father. The patient is not a church attender   ADVANCED DIRECTIVES: Not in place   HEALTH MAINTENANCE: Social History   Tobacco Use  . Smoking status: Never Smoker  . Smokeless tobacco: Never Used  Substance Use Topics  .  Alcohol use: No    Alcohol/week: 0.0 standard drinks  . Drug use: No     Colonoscopy: DEC 2016  PAP:  Bone density: January 2017  Lipid panel:  Allergies  Allergen Reactions  . Codeine Itching  . Gabapentin Other (See Comments)  . Lipitor [Atorvastatin] Other (See Comments)    Severe fatigue. ? memory loss.  . Ace Inhibitors     cough    Current Outpatient Medications  Medication Sig Dispense Refill  . acidophilus (RISAQUAD) CAPS Take 1 capsule by mouth daily. Reported on 02/19/2015    . ALPRAZolam (XANAX) 1 MG tablet Take 1/2-1 tablet 2 - 3 x /day ONLY if needed for Anxiety Attack &  limit to 5 days /week to avoid addiction 90 tablet 0  . anastrozole (ARIMIDEX) 1 MG tablet TAKE 1 TABLET BY MOUTH EVERY DAY 90 tablet 0  . aspirin 81 MG tablet Take 81 mg by mouth daily.    Marland Kitchen atenolol (TENORMIN) 100 MG tablet TAKE 1 TABLET BY MOUTH EVERY DAY 90 tablet 1  . b complex vitamins capsule Take 1 capsule by mouth daily.    . calcium  carbonate (TUMS) 500 MG chewable tablet Chew 1,000 mg by mouth at bedtime.     . Cholecalciferol (VITAMIN D3) 5000 UNITS CAPS Take 5,000 Units by mouth 2 (two) times daily.     . Cinnamon 500 MG TABS Take 1 tablet by mouth daily. Cinnamon 1000 mg, 1 tab in morning and 1 tab evening     . ezetimibe (ZETIA) 10 MG tablet TAKE 1 TABLET BY MOUTH DAILY FOR CHOLESTEROL 90 tablet 1  . Magnesium 250 MG TABS Take 250 mg by mouth 2 (two) times daily.    . Multiple Vitamins-Minerals (MULTIVITAMIN WITH MINERALS) tablet Take 1 tablet by mouth 2 (two) times daily.    . Omega-3 Fatty Acids (FISH OIL) 1000 MG CAPS Take 1,000 mg by mouth daily.    Marland Kitchen OVER THE COUNTER MEDICATION Take 1 tablet by mouth 2 (two) times daily. Tumeric with black pepper    . rosuvastatin (CRESTOR) 40 MG tablet Take 1/2 to 1 tablet daily or as directed for Cholesterol 90 tablet 1  . verapamil (CALAN) 80 MG tablet TAKE 1 TABLET BY MOUTH TWICE DAILY FOR BLOOD PRESSURE 180 tablet 3   No current facility-administered medications for this visit.     OBJECTIVE: Middle-aged white woman in no acute distress  Vitals:   12/02/17 1504  BP: 130/74  Pulse: 97  Resp: 18  Temp: 97.7 F (36.5 C)  SpO2: 96%     Body mass index is 36.97 kg/m.    ECOG FS:1 - Symptomatic but completely ambulatory  Sclerae unicteric, EOMs intact No cervical or supraclavicular adenopathy Lungs no rales or rhonchi Heart regular rate and rhythm Abd soft, nontender, positive bowel sounds MSK no focal spinal tenderness, no upper extremity lymphedema Neuro: nonfocal, well oriented, appropriate affect Breasts: Right breast is status post lumpectomy.  There is no evidence of local recurrence.  The left breast is benign.  Both axillae are benign.  LAB RESULTS:  CMP     Component Value Date/Time   NA 141 10/05/2017 1547   NA 141 12/08/2016 1432   K 3.8 10/05/2017 1547   K 3.9 12/08/2016 1432   CL 106 10/05/2017 1547   CO2 28 10/05/2017 1547   CO2 27 12/08/2016  1432   GLUCOSE 121 (H) 10/05/2017 1547   GLUCOSE 102 12/08/2016 1432   BUN 17 10/05/2017 1547  BUN 19.3 12/08/2016 1432   CREATININE 1.09 (H) 10/05/2017 1547   CREATININE 1.0 12/08/2016 1432   CALCIUM 9.6 10/05/2017 1547   CALCIUM 9.5 12/08/2016 1432   PROT 6.2 10/05/2017 1547   PROT 6.8 12/08/2016 1432   ALBUMIN 3.5 12/08/2016 1432   AST 15 10/05/2017 1547   AST 16 12/08/2016 1432   ALT 19 10/05/2017 1547   ALT 22 12/08/2016 1432   ALKPHOS 61 12/08/2016 1432   BILITOT 0.3 10/05/2017 1547   BILITOT 0.30 12/08/2016 1432   GFRNONAA 53 (L) 10/05/2017 1547   GFRAA 61 10/05/2017 1547    INo results found for: SPEP, UPEP  Lab Results  Component Value Date   WBC 6.7 12/02/2017   NEUTROABS 3.8 12/02/2017   HGB 12.9 12/02/2017   HCT 38.9 12/02/2017   MCV 94.0 12/02/2017   PLT 339 12/02/2017      Chemistry      Component Value Date/Time   NA 141 10/05/2017 1547   NA 141 12/08/2016 1432   K 3.8 10/05/2017 1547   K 3.9 12/08/2016 1432   CL 106 10/05/2017 1547   CO2 28 10/05/2017 1547   CO2 27 12/08/2016 1432   BUN 17 10/05/2017 1547   BUN 19.3 12/08/2016 1432   CREATININE 1.09 (H) 10/05/2017 1547   CREATININE 1.0 12/08/2016 1432      Component Value Date/Time   CALCIUM 9.6 10/05/2017 1547   CALCIUM 9.5 12/08/2016 1432   ALKPHOS 61 12/08/2016 1432   AST 15 10/05/2017 1547   AST 16 12/08/2016 1432   ALT 19 10/05/2017 1547   ALT 22 12/08/2016 1432   BILITOT 0.3 10/05/2017 1547   BILITOT 0.30 12/08/2016 1432       No results found for: LABCA2  No components found for: LABCA125  No results for input(s): INR in the last 168 hours.  Urinalysis    Component Value Date/Time   COLORURINE YELLOW 08/10/2017 1629   APPEARANCEUR CLEAR 08/10/2017 1629   LABSPEC 1.009 08/10/2017 1629   PHURINE 7.0 08/10/2017 1629   GLUCOSEU NEGATIVE 08/10/2017 1629   HGBUR 2+ (A) 08/10/2017 1629   BILIRUBINUR NEGATIVE 02/20/2016 1601   KETONESUR NEGATIVE 08/10/2017 1629    PROTEINUR NEGATIVE 08/10/2017 1629   UROBILINOGEN 0.2 05/30/2013 0903   NITRITE NEGATIVE 08/10/2017 1629   LEUKOCYTESUR 2+ (A) 08/10/2017 1629    STUDIES: Dg Bone Density  Result Date: 11/30/2017 EXAM: DUAL X-RAY ABSORPTIOMETRY (DXA) FOR BONE MINERAL DENSITY IMPRESSION: Referring Physician:  Chauncey Cruel Your patient completed a BMD test using Lunar IDXA DXA system ( analysis version: 16 ) manufactured by [employer]. Technologist: AW PATIENT: Name: Rhena, Glace Patient ID:  [id number] Birth Date: 1950-03-08 Height:     63.5 in. Weight:     212.0 lbs. Sex:         Female Measured:   11/30/2017 Indications: Anastrazole, Breast Cancer History, Caucasian, Estrogen Deficient, Omeprazole, Postmenopausal Fractures: calcaneous Treatments: Calcium (E943.0), Hormone Therapy For Cancer, Vitamin D (E933.5) ASSESSMENT: The BMD measured at Femur Neck Left is 0.992 g/cm2 with a T-score of -0.3. This patient is considered NORMAL according to [facility]) criteria. There has been a statistically significant decrease in BMD of Total Mean since prior exam dated 01/18/2015. The scan quality is good. Lumbar spine was not utilized due to being excluded in prior exam. Site Region Measured Date Measured Age YA T-score BMD Significant CHANGE DualFemur Neck Left 11/30/2017 66.1 -[age] 0.992 g/cm2 * DualFemur Neck Left 01/18/2015 63.2  [age] 1.092 g/cm2 DualFemur Total Mean 11/30/2017 66.1 1.1 1.134 g/cm2 * DualFemur Total Mean 2/18/2015 63.2 1.6 1.195 g/cm2 Left Forearm Radius 33% 11/30/2017 66.1 1.2 0.981 g/cm2 Left Forearm Radius 33% 01/18/2015 63.2 1.4 1.007 g/cm2 World Health Organization [facility]) criteria for post-menopausal, Caucasian Women: Normal       T-score at or above -1 SD Osteopenia   T-score between -1 and -2.5 SD Osteoporosis T-score at or below -2.5 SD RECOMMENDATION: 1. All patients should optimize calcium and vitamin D intake. 2. Consider FDA approved medical therapies in postmenopausal women  and men aged 23 years and older, based on the following: a. A hip or vertebral (clinical or morphometric) fracture b. T- score < or = -2.5 at the femoral neck or spine after appropriate evaluation to exclude secondary causes c. Low bone mass (T-score between -1.0 and -2.5 at the femoral neck or spine) and a 10-year probability of a hip fracture > or = 3% or a 10-year probability of a major osteoporosis-related fracture > or = 20% based on the [place]-adapted WHO algorithm d. Clinician judgment and/or patient preferences may indicate treatment for people with 10-year fracture probabilities above or below these levels FOLLOW-UP: People with diagnosed cases of osteoporosis or at high risk for fracture should have regular bone mineral density tests. For patients eligible for Medicare, routine testing is allowed once every 2 years. The testing frequency can be increased to one year for patients who have rapidly progressing disease, those who are receiving or discontinuing medical therapy to restore bone mass, or have additional risk factors. I have reviewed this report and agree with the above findings. Mark A. Thornton Papas, M.D. [facility] Radiology Electronically Signed   By: Lavonia Dana M.D.   On: 11/30/2017 09:38   Mm Diag Breast Tomo Bilateral  Result Date: 11/30/2017 CLINICAL DATA:  Annual examination. History of right breast cancer status post lumpectomy 2016.She currently takes anastrozole. No current concerns. EXAM: DIGITAL DIAGNOSTIC BILATERAL MAMMOGRAM WITH CAD AND TOMO COMPARISON:  Previous exam(s). ACR Breast Density Category b: There are scattered areas of fibroglandular density. FINDINGS: Lumpectomy changes in the deep upper outer right breast. No mass, nonsurgical distortion, or suspicious microcalcification is identified in either breast to suggest malignancy. Mammographic images were processed with CAD. IMPRESSION: No evidence of malignancy in either breast. Lumpectomy changes on the right. RECOMMENDATION:  Diagnostic mammogram is suggested in 1 year. (Code:[id number]-B-01Y) I have discussed the findings and recommendations with the patient. Results were also provided in writing at the conclusion of the visit. If applicable, a reminder letter will be sent to the patient regarding the next appointment. BI-RADS CATEGORY  2: Benign. Electronically Signed   By: Curlene Dolphin M.D.   On: 11/30/2017 08:39      ASSESSMENT: 67 y.o. [place] woman status post Right breast upper outer quadrant biopsy 11/28/2014 of 2 separate right breast masses, both invasive ductal carcinomas, grade 1, estrogen receptor positive, and HER-2 not amplified. MIB-1 is 5-10%. The smaller mass is progesterone receptor negative  (1) Status post right lumpectomy and sentinel lymph node sampling 12/28/2014 for an mpT1c pN0, stage IA invasive ductal carcinoma, grade 2, repeat HER-2 again negative  (2) Oncotype score of 16 predict sent outside the breast risk of recurrence within 10 years of 10% if the patient's only systemic therapy is tamoxifen for 5 years. It also predicts no benefit from adjuvant chemotherapy.  (3) adjuvant radiation 02/18/2015-04/03/2015:  1. Right breast / 45 Gray at 1.8 Gray per fraction x 25 fractions 2.  Right breast boost / 16 Gray at [employer] per fraction x 8 fractions  (4) Anastrozole started 05/13/2015  (a) bone density 01/18/2015 was normal with a T score of 0.4  (b) on 11/30/2017 bone density  T score was - 0.3   (5) polycystic kidney disease  (a) testosterone level 10 ng/dL [normal 6-86 in women] 07/17/2015   PLAN   Belinda is now just about 3 years out from definitive surgery for breast cancer with no evidence of disease recurrence.  This is very favorable.  She is tolerating anastrozole remarkably well with no significant side effects or complications.  Her repeat bone density does show mild bone loss but is still in the normal range  The plan is to continue anastrozole for total of 5 years which is  going to take [place] through April 2022.  I am going to see her again in January 2021 and then March 2022 which will be her last visit here  She knows to call for any other issues that may develop before then. Lewi Drost, Virgie Dad, MD  12/02/17 3:31 PM Medical Oncology and Hematology [facility] [address] [place], [place] [address] Tel. [phone]    Fax. [phone]    I, Jacqualyn Posey, am acting as a Education administrator for Chauncey Cruel, MD.   I, Lurline Del MD, have reviewed the above documentation for accuracy and completeness, and I agree with the above.

## 2017-12-06 ENCOUNTER — Encounter: Payer: Self-pay | Admitting: Oncology

## 2017-12-09 IMAGING — MG MM BREAST WIRE LOCALIZATION*R*
4 series · 4 of 4 positions shown · non-contrast
Comparison: Previous exams.

CLINICAL DATA: Recently diagnosed invasive ductal carcinoma in the
10 o'clock position of the right breast and second area of invasive
ductal carcinoma with ductal carcinoma in situ in the 10 o'clock
position of the right breast.

EXAM:
NEEDLE LOCALIZATION OF THE RIGHT BREAST WITH MAMMO GUIDANCE

[R LM (1 of 2)]
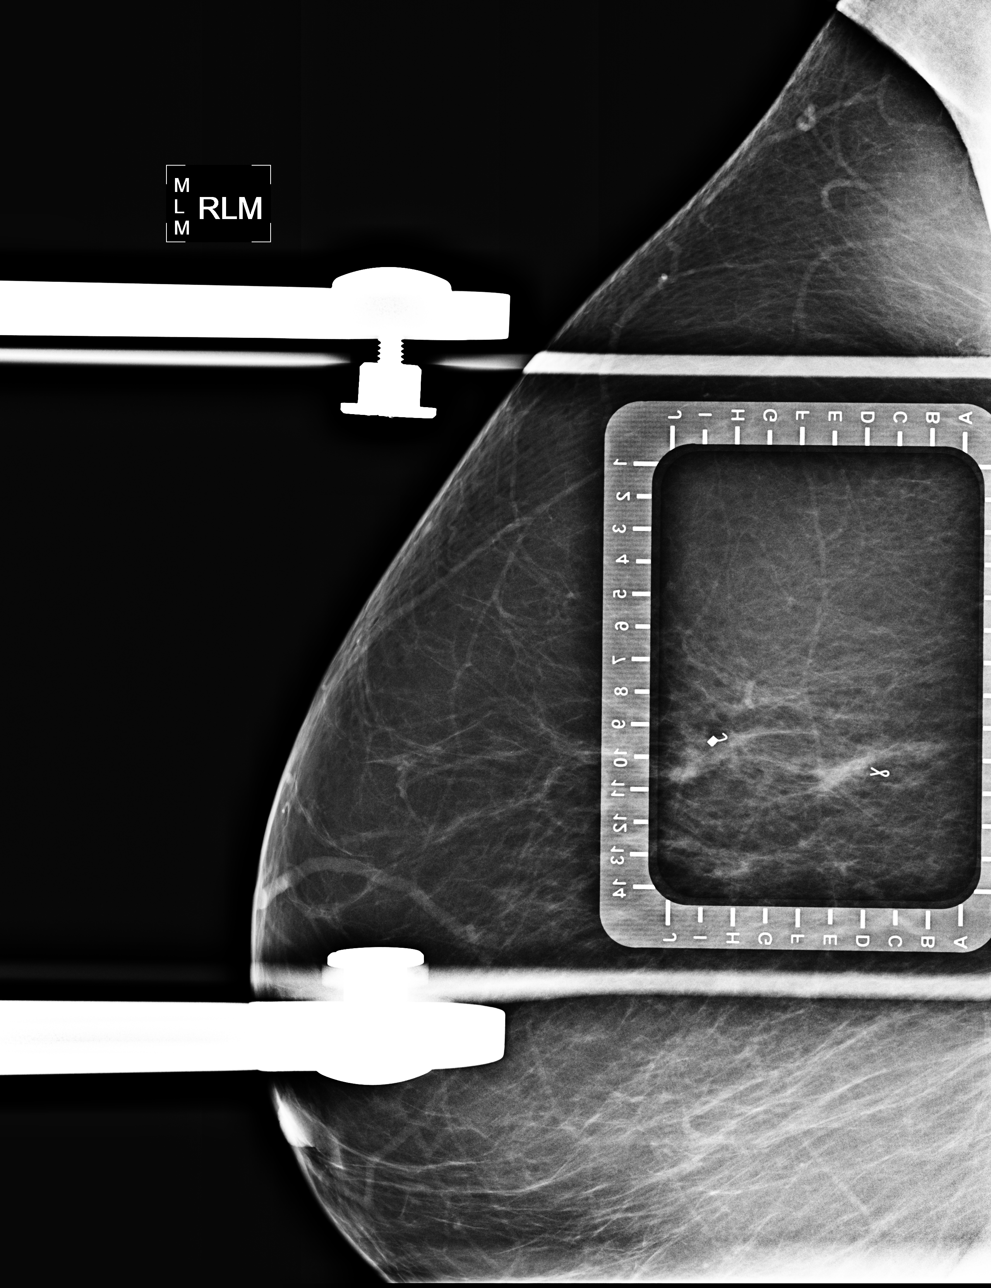

[R LM (2 of 2)]
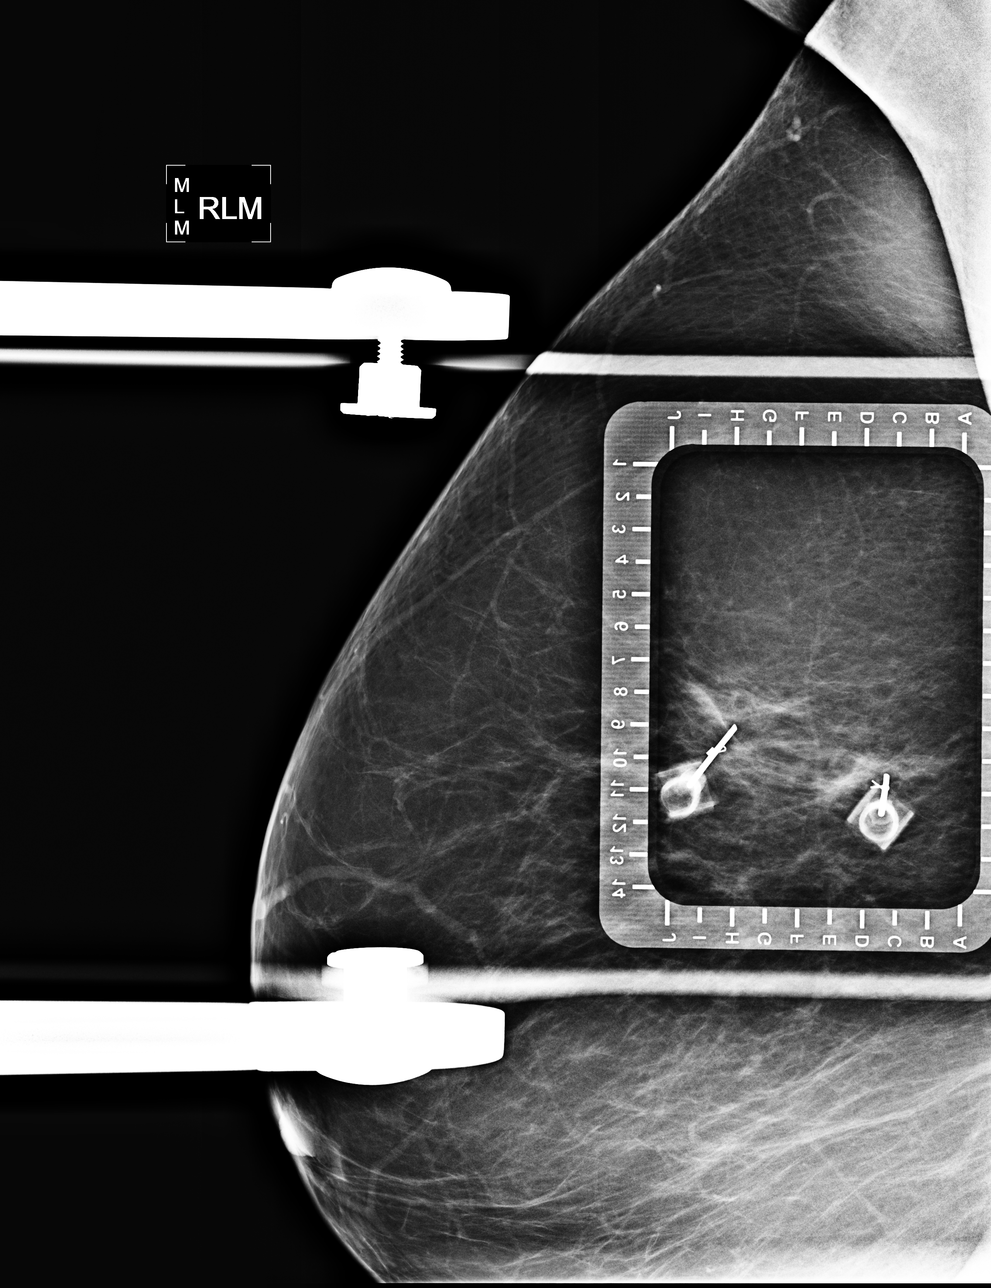

[R CC (1 of 2)]
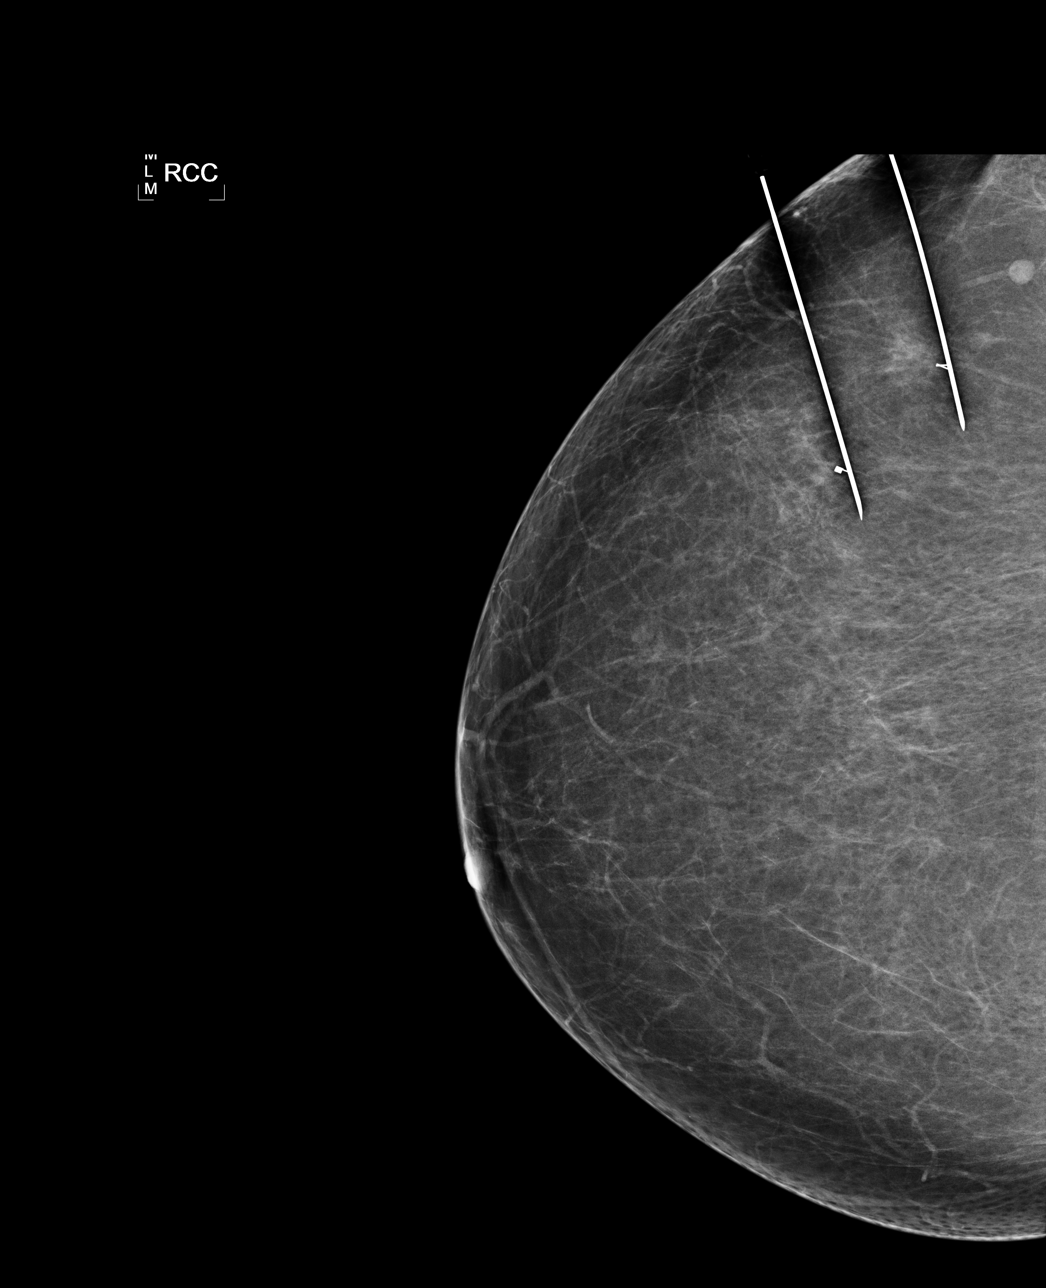

[R CC (2 of 2)]
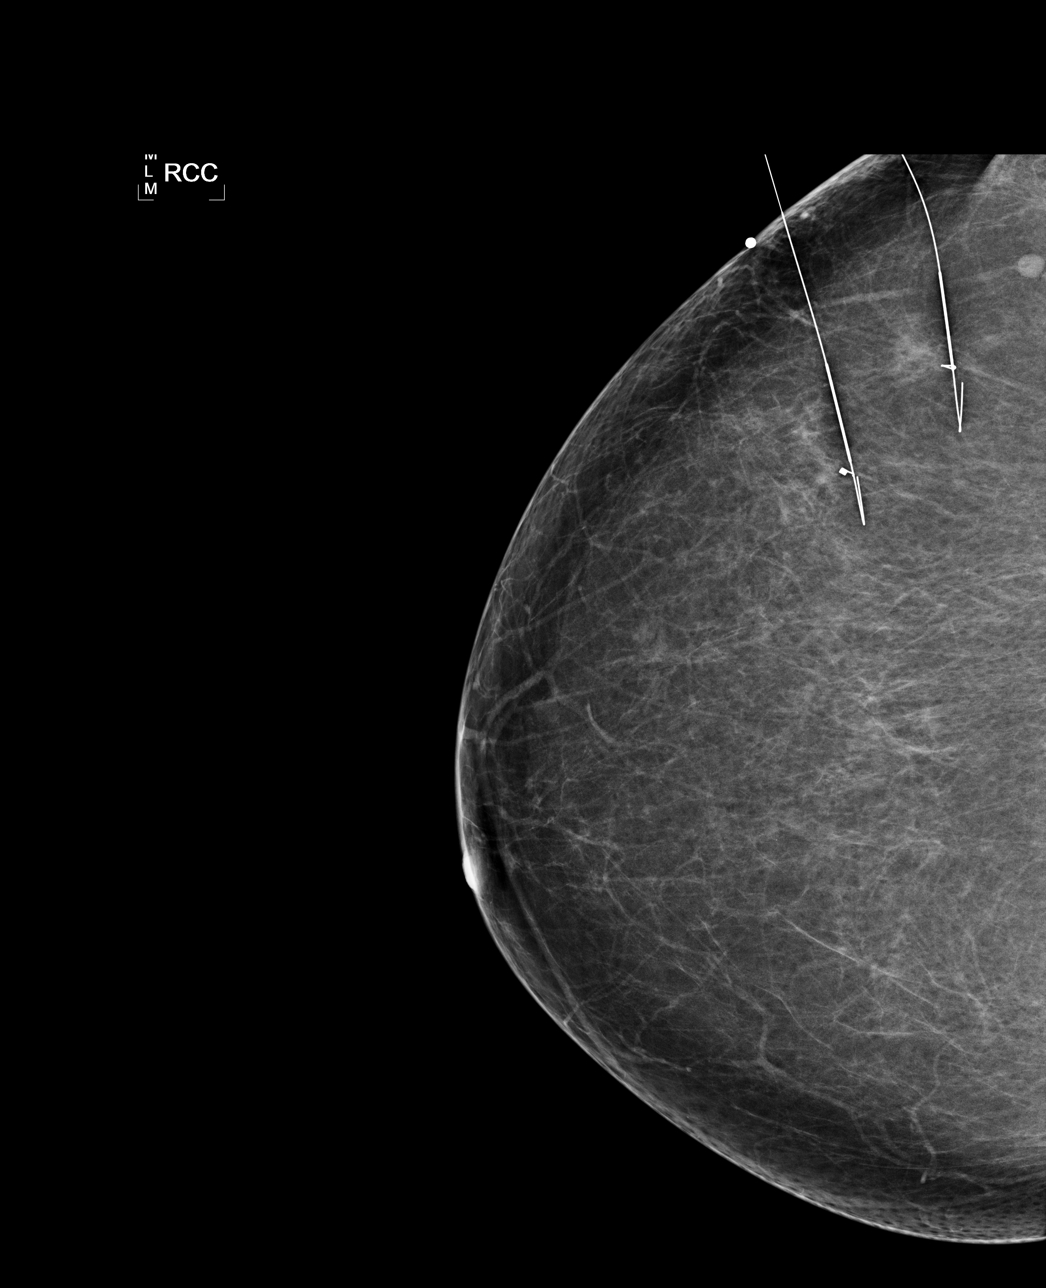

[4 of 4 positions shown; findings below may reference images not displayed]

FINDINGS: Patient presents for needle localization prior to right lumpectomy.
I met with the patient and we discussed the procedure of needle
localization including benefits and alternatives. We discussed the
high likelihood of a successful procedure. We discussed the risks of
the procedure, including infection, bleeding, tissue injury, and
further surgery. Informed, written consent was given. The usual
time-out protocol was performed immediately prior to the procedure.

Using mammographic guidance, sterile technique, 2% lidocaine and two
7 cm modified Kopans needles, the recently placed coil shaped and
ribbon shaped biopsy marker clips in the 10 o'clock position of the
right breast were localized using a lateral approach. The images
were marked for Dr. Bautii.
IMPRESSION: Needle localization right breast. No apparent complications.

## 2017-12-21 ENCOUNTER — Other Ambulatory Visit: Payer: Self-pay | Admitting: Oncology

## 2017-12-21 ENCOUNTER — Other Ambulatory Visit: Payer: Self-pay | Admitting: Internal Medicine

## 2018-01-04 ENCOUNTER — Other Ambulatory Visit: Payer: Self-pay | Admitting: Physician Assistant

## 2018-01-04 ENCOUNTER — Other Ambulatory Visit: Payer: Self-pay | Admitting: Internal Medicine

## 2018-01-04 DIAGNOSIS — M2550 Pain in unspecified joint: Secondary | ICD-10-CM

## 2018-01-15 NOTE — Progress Notes (Signed)
MEDICARE ANNUAL WELLNESS VISIT AND FOLLOW UP [phone]  Assessment:     Medicare preventive visit Due next year  Essential hypertension Continue medication Monitor blood pressure at home; call if consistently over 130/80 Continue DASH diet.   Reminder to go to the ER if any CP, SOB, nausea, dizziness, severe HA, changes vision/speech, left arm numbness and tingling and jaw pain.  Vitamin D deficiency At goal at recent check; continue to recommend supplementation for goal of 70-100 Defer vitamin D level  Prediabetes Discussed disease and risks Discussed diet/exercise, weight management  A1C  Morbid obesity ([place]) Long discussion about weight loss, diet, and exercise Recommended diet heavy in fruits and veggies and low in animal meats, cheeses, and dairy products, appropriate calorie intake Patient will work on continue avoiding stress eating, snacking, portion control Discussed appropriate weight for height Follow up at next visit  Mixed hyperlipidemia Continue medications: zetia Continue low cholesterol diet and exercise.  Check lipid panel.   Medication management CBC, CMP/GFR  Malignant neoplasm of upper-outer quadrant of right breast in female, estrogen receptor positive ([place]) S/p lumpectomy/radiation, continue anastrozole Followed by Dr. [name]  Anxiety Well managed by current regimen; continue medications; reminded to limit use of benzo- will plan to taper off after retirement Stress management techniques discussed, increase water, good sleep hygiene discussed, increase exercise, and increase veggies.   Lapband APS + HH repair Nov 2008    Over 40 minutes of exam, counseling, chart review and critical decision making was performed Future Appointments  Date Time Provider [place]  01/20/2018  2:00 PM [facility] 6 [facility] None  01/20/2018  2:30 PM Magrinat, Virgie Dad, MD [facility] None  05/03/2018  3:00 PM Unk Pinto, MD [facility] None   07/11/2018  4:15 PM Liane Comber, NP [facility] None     Plan:   During the course of the visit the patient was educated and counseled about appropriate screening and preventive services including:    Pneumococcal vaccine   Prevnar 13  Influenza vaccine  Td vaccine  Screening electrocardiogram  Bone densitometry screening  Colorectal cancer screening  Diabetes screening  Glaucoma screening  Nutrition counseling   Advanced directives: requested   Subjective:  Taylor Oconnor is a 68 y.o. female who presents for Medicare Annual Wellness Visit and 3 month follow up.   She has hx of ER+ R breast cancer in 2016, underwent lumpectomy by Dr. Lucia Gaskins and radiation (Dr. Pablo Ledger) and continues with anastrozole followed by Dr. [name].   She has hx of gastric lap band in 2011 with 60 lb weight loss though has unfortunately regained weight after anastrozole treatment and stress.   she has a diagnosis of anxiety and is currently on xanax 1 mg once daily at night, reports symptoms are well controlled on current regimen. she is anticipating reduced stress after she retires, SHE IS STARTING TO WORK PART TIME.   BMI is Body mass index is 37.73 kg/m., she has been working on diet, has been watching her food intake, limiting portion sizes and not eating after dinner. Exercise is limited.  Wt Readings from Last 3 Encounters:  01/17/18 216 lb 6.4 oz (98.2 kg)  12/02/17 212 lb (96.2 kg)  10/05/17 212 lb (96.2 kg)    Her blood pressure has been controlled at home, today their BP is BP: 130/72 She does not workout. She denies chest pain, shortness of breath, dizziness.   She is on cholesterol medication (CRESTOR 40MG  1/2 EVERY OTHER DAY STATES THIS IS HARD  TO REMEMBER, WILL CHANGE TO DAILY AND zetia 10 mg daily) and denies myalgias. Her cholesterol is not at goal. The cholesterol last visit was:   Lab Results  Component Value Date   CHOL 209 (H) 10/05/2017   HDL 50 (L) 10/05/2017    LDLCALC 115 (H) 10/05/2017   TRIG 331 (H) 10/05/2017   CHOLHDL 4.2 10/05/2017    She has been working on diet for prediabetes, and denies foot ulcerations, increased appetite, nausea, paresthesia of the feet, polydipsia, polyuria, visual disturbances, vomiting and weight loss. Last A1C in the office was:  Lab Results  Component Value Date   HGBA1C 5.9 (H) 10/05/2017   Last GFR: Lab Results  Component Value Date   GFRNONAA 60 (L) 12/02/2017   Patient is on Vitamin D supplement.   Lab Results  Component Value Date   VD25OH 15 10/05/2017      Medication Review: Current Outpatient Medications on File Prior to Visit  Medication Sig Dispense Refill  . acidophilus (RISAQUAD) CAPS Take 1 capsule by mouth daily. Reported on 02/19/2015    . ALPRAZolam (XANAX) 1 MG tablet Take 1/2-1 tablet 2 - 3 x /day ONLY if needed for Anxiety Attack &  limit to 5 days /week to avoid addiction 80 tablet 0  . anastrozole (ARIMIDEX) 1 MG tablet TAKE 1 TABLET BY MOUTH EVERY DAY 90 tablet 0  . aspirin 81 MG tablet Take 81 mg by mouth daily.    Marland Kitchen atenolol (TENORMIN) 100 MG tablet TAKE 1 TABLET BY MOUTH EVERY DAY 90 tablet 0  . b complex vitamins capsule Take 1 capsule by mouth daily.    . calcium carbonate (TUMS) 500 MG chewable tablet Chew 1,000 mg by mouth at bedtime.     . Cholecalciferol (VITAMIN D3) 5000 UNITS CAPS Take 5,000 Units by mouth 2 (two) times daily.     . Cinnamon 500 MG TABS Take 1 tablet by mouth daily. Cinnamon 1000 mg, 1 tab in morning and 1 tab evening     . diclofenac sodium (VOLTAREN) 1 % GEL Apply to affected area 4 x /day as needed 100 g 3  . ezetimibe (ZETIA) 10 MG tablet TAKE 1 TABLET BY MOUTH DAILY FOR CHOLESTEROL 90 tablet 1  . Magnesium 250 MG TABS Take 250 mg by mouth 2 (two) times daily.    . Multiple Vitamins-Minerals (MULTIVITAMIN WITH MINERALS) tablet Take 1 tablet by mouth 2 (two) times daily.    . Omega-3 Fatty Acids (FISH OIL) 1000 MG CAPS Take 1,000 mg by mouth daily.     Marland Kitchen OVER THE COUNTER MEDICATION Take 1 tablet by mouth 2 (two) times daily. Tumeric with black pepper    . verapamil (CALAN) 80 MG tablet TAKE 1 TABLET BY MOUTH TWICE DAILY FOR BLOOD PRESSURE 180 tablet 3   No current facility-administered medications on file prior to visit.     Allergies  Allergen Reactions  . Codeine Itching  . Gabapentin Other (See Comments)  . Lipitor [Atorvastatin] Other (See Comments)    Severe fatigue. ? memory loss.  . Ace Inhibitors     cough    Current Problems (verified) Patient Active Problem List   Diagnosis Date Noted  . Anxiety 12/01/2016  . Prediabetes 11/30/2016  . Malignant neoplasm of upper-outer quadrant of right breast in female, estrogen receptor positive ([place]) 12/03/2014  . Medication management 12/18/2013  . Essential hypertension 12/14/2012  . Mixed hyperlipidemia 12/14/2012  . Vitamin D deficiency 12/14/2012  . Morbid obesity ([place]) 12/14/2012  .  Lapband APS + [facility] repair Nov 2008 08/26/2012    Screening Tests Immunization History  Administered Date(s) Administered  . DT 12/18/2013  . DTaP 01/12/2002  . Influenza Split 12/14/2012, 11/19/2013, 10/27/2014  . Influenza, High Dose Seasonal PF 10/05/2017  . Influenza-Unspecified 10/22/2015, 10/25/2016  . PPD Test 12/14/2012, 12/18/2013, 01/24/2015, 02/20/2016  . Pneumococcal Conjugate-13 10/09/2013  . Pneumococcal Polysaccharide-23 01/13/1992, 03/31/2017    Preventative care: Last colonoscopy: 2016 Last mammogram: 11/2017 Last pap smear/pelvic exam: 12/2012 never abnormal DECLINES ANOTHER DEXA: 11/2017 norma  Prior vaccinations: TD or Tdap: 2015  Influenza: 2019 Pneumococcal: 1994, 2019 Prevnar13: 2015 Shingles/Zostavax: Declines  Names of Other Physician/Practitioners you currently use: 1. [facility] here for primary care 2. Dr. Marland Kitchen [facility], eye doctor, last visit 02/2017, goes annually 3. , dentist, last visit remote  Patient Care  Team: Unk Pinto, MD as PCP - General (Internal Medicine) Alphonsa Overall, MD as Consulting Physician (General Surgery) Magrinat, Virgie Dad, MD as Consulting Physician (Oncology) Thea Silversmith, MD as Consulting Physician (Radiation Oncology) Sylvan Cheese, NP as Nurse Practitioner (Hematology and Oncology)  SURGICAL HISTORY She  has a past surgical history that includes Cholecystectomy; Diagnostic laparoscopy; Hernia repair (2008); Tubal ligation; Back surgery; laparoscopic appendectomy (N/A, 08/06/2012); Appendectomy (08/06/12); Laparoscopic gastric banding with hiatal hernia repair (N/A, Nov 2008); Colonoscopy (09/29/2004); Cesarean section; Breast lumpectomy with needle localization and axillary sentinel lymph node bx (Right, 12/28/2014); Breast biopsy; and Breast lumpectomy (Right, 2016). FAMILY HISTORY Her family history includes AAA (abdominal aortic aneurysm) in her sister; Alcohol abuse in her mother; Breast cancer in her cousin; Cancer in her cousin; Cirrhosis in her mother; Diabetes in her father; Heart disease in her father; Hypertension in her father; Liver disease in her mother; Stroke in her father. SOCIAL HISTORY She  reports that she has never smoked. She has never used smokeless tobacco. She reports that she does not drink alcohol or use drugs.   MEDICARE WELLNESS OBJECTIVES: Physical activity: Current Exercise Habits: The patient does not participate in regular exercise at present Cardiac risk factors: Cardiac Risk Factors include: advanced age (>58men, >32 women);dyslipidemia;hypertension;obesity (BMI >30kg/m2);sedentary lifestyle Depression/mood screen:   Depression screen [facility] 2/9 01/17/2018  Decreased Interest 0  Down, Depressed, Hopeless 0  PHQ - 2 Score 0    ADLs:  In your present state of health, do you have any difficulty performing the following activities: 01/17/2018 10/09/2017  Hearing? N N  Vision? N N  Difficulty concentrating or making decisions? N  N  Walking or climbing stairs? N N  Dressing or bathing? N N  Doing errands, shopping? N N  Some recent data might be hidden     Cognitive Testing  Alert? Yes  Normal Appearance?Yes  Oriented to person? Yes  Place? Yes   Time? Yes  Recall of three objects?  Yes  Can perform simple calculations? Yes  Displays appropriate judgment?Yes  Can read the correct time from a watch face?Yes  EOL planning: Does Patient Have a Medical Advance Directive?: No Would patient like information on creating a medical advance directive?: No - Patient declined(has paper work at home, talked about it with her husband)  Review of Systems  Constitutional: Negative for malaise/fatigue and weight loss.  HENT: Negative for hearing loss and tinnitus.   Eyes: Negative for blurred vision and double vision.  Respiratory: Negative for cough, sputum production, shortness of breath and wheezing.   Cardiovascular: Negative for chest pain, palpitations, orthopnea, claudication, leg swelling and PND.  Gastrointestinal: Negative for abdominal pain, blood in stool, constipation, diarrhea, heartburn, melena, nausea and vomiting.  Genitourinary: Negative.   Musculoskeletal: Negative for falls, joint pain and myalgias.  Skin: Negative for rash.  Neurological: Negative for dizziness, tingling, sensory change, weakness and headaches.  Endo/Heme/Allergies: Negative for polydipsia.  Psychiatric/Behavioral: Negative for depression, memory loss, substance abuse and suicidal ideas. The patient is nervous/anxious and has insomnia.   All other systems reviewed and are negative.    Objective:     Today's Vitals   01/17/18 1532  BP: 130/72  Pulse: 92  Temp: (!) 97.3 F (36.3 C)  SpO2: 97%  Weight: 216 lb 6.4 oz (98.2 kg)  Height: 5' 3.5" (1.613 m)   Body mass index is 37.73 kg/m.  General appearance: alert, no distress, WD/WN, female HEENT: normocephalic, sclerae anicteric, TMs pearly, nares patent, no discharge or  erythema, pharynx normal Oral cavity: MMM, no lesions Neck: supple, no lymphadenopathy, no thyromegaly, no masses Heart: RRR, normal S1, S2, no murmurs Lungs: CTA bilaterally, no wheezes, rhonchi, or rales Abdomen: +bs, soft, non tender, non distended, no masses, no hepatomegaly, no splenomegaly Musculoskeletal: nontender, no swelling, no obvious deformity Extremities: no edema, no cyanosis, no clubbing Pulses: 2+ symmetric, upper and lower extremities, normal cap refill Neurological: alert, oriented x 3, CN2-12 intact, strength normal upper extremities and lower extremities, sensation normal throughout, DTRs 2+ throughout, no cerebellar signs, gait normal Psychiatric: normal affect, behavior normal, pleasant   Medicare Attestation I have personally reviewed: The patient's medical and social history Their use of alcohol, tobacco or illicit drugs Their current medications and supplements The patient's functional ability including ADLs,fall risks, home safety risks, cognitive, and hearing and visual impairment Diet and physical activities Evidence for depression or mood disorders  The patient's weight, height, BMI, and visual acuity have been recorded in the chart.  I have made referrals, counseling, and provided education to the patient based on review of the above and I have provided the patient with a written personalized care plan for preventive services.     Vicie Mutters, PA-C   01/17/2018

## 2018-01-17 ENCOUNTER — Ambulatory Visit (INDEPENDENT_AMBULATORY_CARE_PROVIDER_SITE_OTHER): Payer: Medicare Other | Admitting: Physician Assistant

## 2018-01-17 ENCOUNTER — Encounter: Payer: Self-pay | Admitting: Oncology

## 2018-01-17 ENCOUNTER — Encounter: Payer: Self-pay | Admitting: Physician Assistant

## 2018-01-17 VITALS — BP 130/72 | HR 92 | Temp 97.3°F | Ht 63.5 in | Wt 216.4 lb

## 2018-01-17 DIAGNOSIS — Z79899 Other long term (current) drug therapy: Secondary | ICD-10-CM | POA: Diagnosis not present

## 2018-01-17 DIAGNOSIS — Z0001 Encounter for general adult medical examination with abnormal findings: Secondary | ICD-10-CM

## 2018-01-17 DIAGNOSIS — Z9884 Bariatric surgery status: Secondary | ICD-10-CM

## 2018-01-17 DIAGNOSIS — R6889 Other general symptoms and signs: Secondary | ICD-10-CM

## 2018-01-17 DIAGNOSIS — R7303 Prediabetes: Secondary | ICD-10-CM | POA: Diagnosis not present

## 2018-01-17 DIAGNOSIS — E782 Mixed hyperlipidemia: Secondary | ICD-10-CM

## 2018-01-17 DIAGNOSIS — E559 Vitamin D deficiency, unspecified: Secondary | ICD-10-CM | POA: Diagnosis not present

## 2018-01-17 DIAGNOSIS — C50411 Malignant neoplasm of upper-outer quadrant of right female breast: Secondary | ICD-10-CM

## 2018-01-17 DIAGNOSIS — Z170 Estrogen receptor positive status [ER+]: Secondary | ICD-10-CM

## 2018-01-17 DIAGNOSIS — I10 Essential (primary) hypertension: Secondary | ICD-10-CM | POA: Diagnosis not present

## 2018-01-17 DIAGNOSIS — Z0000 Encounter for general adult medical examination without abnormal findings: Secondary | ICD-10-CM

## 2018-01-17 DIAGNOSIS — F419 Anxiety disorder, unspecified: Secondary | ICD-10-CM

## 2018-01-17 MED ORDER — ROSUVASTATIN CALCIUM 10 MG PO TABS: ORAL_TABLET | ORAL | 2 refills | Status: DC

## 2018-01-17 NOTE — Patient Instructions (Addendum)
Do the crestor 10mg  every day with the zetia  Silent reflux: Not all heartburn burns...Marland KitchenMarland KitchenMarland Kitchen  What is LPR? Laryngopharyngeal reflux (LPR) or silent reflux is a condition in which acid that is made in the stomach travels up the esophagus (swallowing tube) and gets to the throat. Not everyone with reflux has a lot of heartburn or indigestion. In fact, many people with LPR never have heartburn. This is why LPR is called SILENT REFLUX, and the terms "Silent reflux" and "LPR" are often used interchangeably. Because LPR is silent, it is sometimes difficult to diagnose.  How can you tell if you have LPR?  Marland Kitchen Chronic hoarseness- Some people have hoarseness that comes and goes . throat clearing  . Cough . It can cause shortness of breath and cause asthma like symptoms. Marland Kitchen a feeling of a lump in the throat  . difficulty swallowing . a problem with too much nose and throat drainage.  . Some people will feel their esophagus spasm which feels like their heart beating hard and fast, this will usually be after a meal, at rest, or lying down at night.    How do I treat this? Treatment for LPR should be individualized, and your doctor will suggest the best treatment for you. Generally there are several treatments for LPR: . changing habits and diet to reduce reflux,  . medications to reduce stomach acid, and  . surgery to prevent reflux. Most people with LPR need to modify how and when they eat, as well as take some medication, to get well. Sometimes, nonprescription liquid antacids, such as Maalox, Gelucil and Mylanta are recommended. When used, these antacids should be taken four times each day - one tablespoon one hour after each meal and before bedtime. Dietary and lifestyle changes alone are not often enough to control LPR - medications that reduce stomach acid are also usually needed. These must be prescribed by our doctor.   TIPS FOR REDUCING REFLUX AND LPR Control your LIFE-STYLE and your  DIET! Marland Kitchen If you use tobacco, QUIT.  Marland Kitchen Smoking makes you reflux. After every cigarette you have some LPR.  . Don't wear clothing that is too tight, especially around the waist (trousers, corsets, belts).  . Do not lie down just after eating...in fact, do not eat within three hours of bedtime.  . You should be on a low-fat diet.  . Limit your intake of red meat.  . Limit your intake of butter.  Marland Kitchen Avoid fried foods.  . Avoid chocolate  . Avoid cheese.  Marland Kitchen Avoid eggs. Marland Kitchen Specifically avoid caffeine (especially coffee and tea), soda pop (especially cola) and mints.  . Avoid alcoholic beverages, particularly in the evening.  Common causes of cough OR hoarseness OR sore throat:   Allergies, Viral Infections, Acid Reflux and Bacterial Infections.   Allergies and viral infections cause a cough OR sore throat by post nasal drip and are often worse at night, can also have sneezing, lower grade fevers, clear/yellow mucus. This is best treated with allergy medications or nasal sprays.  Please get on allegra for 1-2 weeks The strongest is allegra or fexafinadine  Cheapest at [employer], [employer], [employer]   Bacterial infections are more severe than allergies or viral infections with fever, teeth pain, fatigue. This can be treated with prednisone and the same over the counter medication and after 7 days can be treated with an antibiotic.   Silent reflux/GERD can cause a cough OR sore throat OR hoarseness WITHOUT heart burn because the  esophagus that goes to the stomach and trachea that goes to the lungs are very close and when you lay down the acid can irritate your throat and lungs. This can cause hoarseness, cough, and wheezing. Please stop any alcohol or anti-inflammatories like aleve/advil/ibuprofen and start an over the counter Prilosec or omeprazole 1-2 times daily 6mins before food for 2 weeks, then switch to over the counter zantac/ratinidine or pepcid/famotadine once at night for 2 weeks.    sometimes  irritation causes more irritation. Try voice rest, use sugar free cough drops to prevent coughing, and try to stop clearing your throat.   If you ever have a cough that does not go away after trying these things please make a follow up visit for further evaluation or we can refer you to a specialist. Or if you ever have shortness of breath or chest pain go to the ER.

## 2018-01-18 LAB — HEMOGLOBIN A1C
HEMOGLOBIN A1C: 6.1 %{Hb} — AB (ref <5.7)
Mean Plasma Glucose: 128 (calc)
eAG (mmol/L): 7.1 (calc)

## 2018-01-18 LAB — CBC WITH DIFFERENTIAL/PLATELET
Absolute Monocytes: 730 cells/uL (ref 200–950)
BASOS ABS: 61 {cells}/uL (ref 0–200)
BASOS PCT: 0.8 %
EOS ABS: 167 {cells}/uL (ref 15–500)
Eosinophils Relative: 2.2 %
HCT: 38.9 % (ref 35.0–45.0)
Hemoglobin: 13.2 g/dL (ref 11.7–15.5)
Lymphs Abs: 2432 cells/uL (ref 850–3900)
MCH: 31.2 pg (ref 27.0–33.0)
MCHC: 33.9 g/dL (ref 32.0–36.0)
MCV: 92 fL (ref 80.0–100.0)
MPV: 8.8 fL (ref 7.5–12.5)
Monocytes Relative: 9.6 %
Neutro Abs: 4210 cells/uL (ref 1500–7800)
Neutrophils Relative %: 55.4 %
PLATELETS: 346 10*3/uL (ref 140–400)
RBC: 4.23 10*6/uL (ref 3.80–5.10)
RDW: 11.8 % (ref 11.0–15.0)
TOTAL LYMPHOCYTE: 32 %
WBC: 7.6 10*3/uL (ref 3.8–10.8)

## 2018-01-18 LAB — COMPLETE METABOLIC PANEL WITH GFR
AG Ratio: 1.8 (calc) (ref 1.0–2.5)
ALBUMIN MSPROF: 4 g/dL (ref 3.6–5.1)
ALT: 18 U/L (ref 6–29)
AST: 18 U/L (ref 10–35)
Alkaline phosphatase (APISO): 57 U/L (ref 33–130)
BUN: 13 mg/dL (ref 7–25)
CALCIUM: 9.3 mg/dL (ref 8.6–10.4)
CO2: 32 mmol/L (ref 20–32)
CREATININE: 0.83 mg/dL (ref 0.50–0.99)
Chloride: 105 mmol/L (ref 98–110)
GFR, EST AFRICAN AMERICAN: 85 mL/min/{1.73_m2} (ref >=60)
GFR, Est Non African American: 73 mL/min/{1.73_m2} (ref >=60)
GLUCOSE: 115 mg/dL — AB (ref 65–99)
Globulin: 2.2 g/dL (calc) (ref 1.9–3.7)
Potassium: 4.3 mmol/L (ref 3.5–5.3)
Sodium: 142 mmol/L (ref 135–146)
TOTAL PROTEIN: 6.2 g/dL (ref 6.1–8.1)
Total Bilirubin: 0.4 mg/dL (ref 0.2–1.2)

## 2018-01-18 LAB — TSH: TSH: 1.66 mIU/L (ref 0.40–4.50)

## 2018-01-18 LAB — LIPID PANEL
Cholesterol: 138 mg/dL (ref <200)
HDL: 68 mg/dL (ref >50)
LDL CHOLESTEROL (CALC): 49 mg/dL
NON-HDL CHOLESTEROL (CALC): 70 mg/dL (ref <130)
Total CHOL/HDL Ratio: 2 (calc) (ref <5.0)
Triglycerides: 128 mg/dL (ref <150)

## 2018-01-18 LAB — MAGNESIUM: Magnesium: 2.2 mg/dL (ref 1.5–2.5)

## 2018-01-20 ENCOUNTER — Inpatient Hospital Stay: Payer: Medicare Other | Admitting: Oncology

## 2018-01-20 ENCOUNTER — Inpatient Hospital Stay: Payer: Medicare Other | Attending: Oncology

## 2018-01-24 ENCOUNTER — Encounter: Payer: Self-pay | Admitting: Oncology

## 2018-02-12 DIAGNOSIS — Z87442 Personal history of urinary calculi: Secondary | ICD-10-CM

## 2018-02-12 HISTORY — DX: Personal history of urinary calculi: Z87.442

## 2018-02-13 ENCOUNTER — Other Ambulatory Visit: Payer: Self-pay | Admitting: Adult Health

## 2018-02-13 DIAGNOSIS — E782 Mixed hyperlipidemia: Secondary | ICD-10-CM

## 2018-02-14 ENCOUNTER — Other Ambulatory Visit: Payer: Self-pay | Admitting: Internal Medicine

## 2018-02-14 DIAGNOSIS — I10 Essential (primary) hypertension: Secondary | ICD-10-CM

## 2018-02-14 DIAGNOSIS — E782 Mixed hyperlipidemia: Secondary | ICD-10-CM

## 2018-02-14 DIAGNOSIS — Z170 Estrogen receptor positive status [ER+]: Principal | ICD-10-CM

## 2018-02-14 DIAGNOSIS — C50411 Malignant neoplasm of upper-outer quadrant of right female breast: Secondary | ICD-10-CM

## 2018-02-14 MED ORDER — EZETIMIBE 10 MG PO TABS: ORAL_TABLET | ORAL | 3 refills | Status: DC

## 2018-02-14 MED ORDER — ANASTROZOLE 1 MG PO TABS: ORAL_TABLET | ORAL | 3 refills | Status: DC

## 2018-02-14 MED ORDER — VERAPAMIL HCL 80 MG PO TABS: ORAL_TABLET | ORAL | 3 refills | Status: DC

## 2018-02-23 ENCOUNTER — Telehealth: Payer: Self-pay | Admitting: Physician Assistant

## 2018-02-23 MED ORDER — ALPRAZOLAM 1 MG PO TABS: ORAL_TABLET | ORAL | 0 refills | Status: DC

## 2018-02-23 NOTE — Telephone Encounter (Signed)
-----   Message from Elenor Quinones, [place] sent at 02/21/2018  9:21 AM EST ----- Regarding: med refills Contact: [phone] Per pt/Yellow note:   Refill on XANAX Please & Thank You!   FYI: pharmacy:  [facility]

## 2018-02-26 ENCOUNTER — Encounter (HOSPITAL_COMMUNITY): Payer: Self-pay | Admitting: Emergency Medicine

## 2018-02-26 ENCOUNTER — Other Ambulatory Visit: Payer: Self-pay

## 2018-02-26 ENCOUNTER — Emergency Department (HOSPITAL_COMMUNITY)
Admission: EM | Admit: 2018-02-26 | Discharge: 2018-02-27 | Disposition: A | Discharge status: Home / Self Care | Payer: Medicare Other | Attending: Emergency Medicine | Admitting: Emergency Medicine

## 2018-02-26 DIAGNOSIS — N182 Chronic kidney disease, stage 2 (mild): Secondary | ICD-10-CM | POA: Insufficient documentation

## 2018-02-26 DIAGNOSIS — N201 Calculus of ureter: Secondary | ICD-10-CM | POA: Diagnosis not present

## 2018-02-26 DIAGNOSIS — I129 Hypertensive chronic kidney disease with stage 1 through stage 4 chronic kidney disease, or unspecified chronic kidney disease: Secondary | ICD-10-CM | POA: Insufficient documentation

## 2018-02-26 DIAGNOSIS — Z79899 Other long term (current) drug therapy: Secondary | ICD-10-CM | POA: Diagnosis not present

## 2018-02-26 DIAGNOSIS — Z853 Personal history of malignant neoplasm of breast: Secondary | ICD-10-CM | POA: Insufficient documentation

## 2018-02-26 DIAGNOSIS — Z7982 Long term (current) use of aspirin: Secondary | ICD-10-CM | POA: Diagnosis not present

## 2018-02-26 DIAGNOSIS — N202 Calculus of kidney with calculus of ureter: Secondary | ICD-10-CM | POA: Diagnosis not present

## 2018-02-26 DIAGNOSIS — E1129 Type 2 diabetes mellitus with other diabetic kidney complication: Secondary | ICD-10-CM | POA: Diagnosis not present

## 2018-02-26 DIAGNOSIS — R319 Hematuria, unspecified: Secondary | ICD-10-CM | POA: Diagnosis present

## 2018-02-26 LAB — CBC
HEMATOCRIT: 42.5 % (ref 36.0–46.0)
Hemoglobin: 13.6 g/dL (ref 12.0–15.0)
MCH: 30.8 pg (ref 26.0–34.0)
MCHC: 32 g/dL (ref 30.0–36.0)
MCV: 96.2 fL (ref 80.0–100.0)
NRBC: 0 % (ref 0.0–0.2)
Platelets: 361 10*3/uL (ref 150–400)
RBC: 4.42 MIL/uL (ref 3.87–5.11)
RDW: 12.3 % (ref 11.5–15.5)
WBC: 9.1 10*3/uL (ref 4.0–10.5)

## 2018-02-26 LAB — BASIC METABOLIC PANEL
ANION GAP: 9 (ref 5–15)
BUN: 20 mg/dL (ref 8–23)
CHLORIDE: 108 mmol/L (ref 98–111)
CO2: 23 mmol/L (ref 22–32)
Calcium: 9.2 mg/dL (ref 8.9–10.3)
Creatinine, Ser: 1.26 mg/dL — ABNORMAL HIGH (ref 0.44–1.00)
GFR calc non Af Amer: 44 mL/min — ABNORMAL LOW (ref >60)
GFR, EST AFRICAN AMERICAN: 51 mL/min — AB (ref >60)
Glucose, Bld: 118 mg/dL — ABNORMAL HIGH (ref 70–99)
Potassium: 3.9 mmol/L (ref 3.5–5.1)
Sodium: 140 mmol/L (ref 135–145)

## 2018-02-26 MED ORDER — ONDANSETRON HCL 4 MG/2ML IJ SOLN
4.0000 mg | Freq: Once | INTRAMUSCULAR | Status: AC
  Administered 2018-02-27: 4 mg via INTRAVENOUS
  Filled 2018-02-26: qty 2

## 2018-02-26 MED ORDER — KETOROLAC TROMETHAMINE 30 MG/ML IJ SOLN
30.0000 mg | Freq: Once | INTRAMUSCULAR | Status: AC
  Administered 2018-02-27: 30 mg via INTRAVENOUS
  Filled 2018-02-26: qty 1

## 2018-02-26 NOTE — ED Notes (Signed)
Bed: WA12 Expected date:  Expected time:  Means of arrival:  Comments: 

## 2018-02-26 NOTE — ED Triage Notes (Signed)
Patient complaining of right lower back pain radiating to left lower abdominal side. Patient states her urine had blood in it. Patient states she felt nauseas and it went away.

## 2018-02-27 ENCOUNTER — Emergency Department (HOSPITAL_COMMUNITY): Payer: Medicare Other

## 2018-02-27 DIAGNOSIS — N202 Calculus of kidney with calculus of ureter: Secondary | ICD-10-CM | POA: Diagnosis not present

## 2018-02-27 LAB — URINALYSIS, ROUTINE W REFLEX MICROSCOPIC
BILIRUBIN URINE: NEGATIVE
Bacteria, UA: NONE SEEN
Glucose, UA: NEGATIVE mg/dL
KETONES UR: NEGATIVE mg/dL
NITRITE: NEGATIVE
PH: 7 (ref 5.0–8.0)
Protein, ur: NEGATIVE mg/dL
Specific Gravity, Urine: 1.01 (ref 1.005–1.030)

## 2018-02-27 MED ORDER — ONDANSETRON HCL 4 MG PO TABS: 4.0000 mg | ORAL_TABLET | Freq: Three times a day (TID) | ORAL | 0 refills | Status: DC | PRN

## 2018-02-27 MED ORDER — HYDROCODONE-ACETAMINOPHEN 5-325 MG PO TABS: 1.0000 | ORAL_TABLET | Freq: Four times a day (QID) | ORAL | 0 refills | Status: DC | PRN

## 2018-02-27 MED ORDER — TAMSULOSIN HCL 0.4 MG PO CAPS: ORAL_CAPSULE | ORAL | 0 refills | Status: DC

## 2018-02-27 NOTE — ED Provider Notes (Signed)
[place] DEPT Provider Note   CSN: [id number] Arrival date & time: 02/26/18  2126  Time seen 11:37 PM   History   Chief Complaint Chief Complaint  Patient presents with  . Hematuria  . Back Pain    HPI Taylor Oconnor is a 68 y.o. female.  HPI patient states about 7 PM this evening she was watching TV and started getting pain in her right side and states her whole abdomen got tight.  She thought she had to use the bathroom and had a BM however it did not help the pain.  She went back and had another small BM and also urinated and noted there was blood in the water and blood when she wiped.  She denies any nausea or vomiting.  She denies fever, frequency, or prior hematuria.  She denies any abdominal bloating.  She states her pain when it was bad was sharp however now it is dull.  It does have a component of getting worse with movement.  Nothing she did made it feel better.  She denies having this pain before.  She denies prior history of kidney stones in herself or her family.  She states she had a bad UTI in July 2019.  She does not have a lot of milk ingestion and states she only drinks 1 caffeinated drink a day.  Patient states she was diagnosed with breast cancer in 2016 and had a lumpectomy and radiation, she is currently on hormonal therapy.  PCP Unk Pinto, MD Oncology Dr [place]  Past Medical History:  Diagnosis Date  . Allergy    SEASONAL  . Anxiety   . At risk for difficult insertion of breathing tube   . Breast cancer ([place])   . Breast cancer of upper-outer quadrant of right female breast ([place]) 12/03/2014  . 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 GERD (gastroesophageal reflux disease)    hx - resolved with lap band surgery  . H/O hiatal hernia    resolved with band surgery  . Hyperlipidemia   . Hypertension   . Insomnia    tx with zanax  . Personal history of radiation therapy   . 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 Type II or unspecified type diabetes mellitus without mention of complication, not stated as uncontrolled 12/14/2012   no meds, diet controlled    Patient Active Problem List   Diagnosis Date Noted  . Anxiety 12/01/2016  . Prediabetes 11/30/2016  . Malignant neoplasm of upper-outer quadrant of right breast in female, estrogen receptor positive ([place]) 12/03/2014  . Medication management 12/18/2013  . Essential hypertension 12/14/2012  . Mixed hyperlipidemia 12/14/2012  . Vitamin D deficiency 12/14/2012  . Morbid obesity ([place]) 12/14/2012  . Lapband APS + [facility] repair Nov 2008 08/26/2012    Past Surgical History:  Procedure Laterality Date  . APPENDECTOMY  08/06/12   lap appy  . BACK SURGERY     cervical  . BREAST BIOPSY    . BREAST LUMPECTOMY Right 2016  . BREAST LUMPECTOMY WITH NEEDLE LOCALIZATION AND AXILLARY SENTINEL LYMPH NODE BX Right 12/28/2014   Procedure: RIGHT BREAST LUMPECTOMY WITH TWO (2) NEEDLE LOCALIZATION AND AXILLARY SENTINEL LYMPH NODE BX;  Surgeon: Alphonsa Overall, MD;  Location: [place];  Service: General;  Laterality: Right;  . CESAREAN SECTION     x 1  . CHOLECYSTECTOMY    . COLONOSCOPY  09/29/2004   normal  . DIAGNOSTIC LAPAROSCOPY    .  HERNIA REPAIR  2008  . LAPAROSCOPIC APPENDECTOMY N/A 08/06/2012   Procedure: APPENDECTOMY LAPAROSCOPIC;  Surgeon: Earnstine Regal, MD;  Location: [facility];  Service: General;  Laterality: N/A;  . LAPAROSCOPIC GASTRIC BANDING WITH HIATAL HERNIA REPAIR N/A Nov 2008  . TUBAL LIGATION       OB History   No obstetric history on file.    Obstetric Comments  GYNECOLOGIC HISTORY:  No LMP recorded. Patient is postmenopausal. Menarche age 70, first live birth age 6. The patient is GX P1. She stopped having periods in 2002. She did not take hormone replacement. She used oral contraceptives for 11 years remot ely, with no complications.          Home Medications    Prior to Admission medications    Medication Sig Start Date End Date Taking? Authorizing Provider  acidophilus (RISAQUAD) CAPS Take 1 capsule by mouth daily. Reported on 02/19/2015    [provider]  ALPRAZolam Duanne Moron) 1 MG tablet Take 1/2-1 tablet 2 - 3 x /day ONLY if needed for Anxiety Attack &  limit to 5 days /week to avoid addiction 02/23/18   Vicie Mutters, PA-C  anastrozole (ARIMIDEX) 1 MG tablet Take 1 tablet daily for Breast Cancer protection 02/14/18   Unk Pinto, MD  aspirin 81 MG tablet Take 81 mg by mouth daily.    [provider]  atenolol (TENORMIN) 100 MG tablet TAKE 1 TABLET BY MOUTH EVERY DAY 12/21/17   Liane Comber, NP  b complex vitamins capsule Take 1 capsule by mouth daily.    [provider]  calcium carbonate (TUMS) 500 MG chewable tablet Chew 1,000 mg by mouth at bedtime.     [provider]  Cholecalciferol (VITAMIN D3) 5000 UNITS CAPS Take 5,000 Units by mouth 2 (two) times daily.     [provider]  Cinnamon 500 MG TABS Take 1 tablet by mouth daily. Cinnamon 1000 mg, 1 tab in morning and 1 tab evening     [provider]  diclofenac sodium (VOLTAREN) 1 % GEL Apply to affected area 4 x /day as needed 01/04/18   Unk Pinto, MD  ezetimibe (ZETIA) 10 MG tablet Take 1 tablet daily for Cholesterol 02/14/18   Unk Pinto, MD  HYDROcodone-acetaminophen (NORCO/VICODIN) 5-325 MG tablet Take 1 tablet by mouth every 6 (six) hours as needed. 02/27/18   Rolland Porter, MD  Magnesium 250 MG TABS Take 250 mg by mouth 2 (two) times daily.    [provider]  Multiple Vitamins-Minerals (MULTIVITAMIN WITH MINERALS) tablet Take 1 tablet by mouth 2 (two) times daily.    [provider]  Omega-3 Fatty Acids (FISH OIL) 1000 MG CAPS Take 1,000 mg by mouth daily.    [provider]  ondansetron (ZOFRAN) 4 MG tablet Take 1 tablet (4 mg total) by mouth every 8 (eight) hours as needed for nausea or vomiting. 02/27/18   Rolland Porter, MD  OVER  THE COUNTER MEDICATION Take 1 tablet by mouth 2 (two) times daily. Tumeric with black pepper    [provider]  rosuvastatin (CRESTOR) 10 MG tablet 1 tablet daily or as directed for Cholesterol 01/17/18   Vicie Mutters, PA-C  tamsulosin (FLOMAX) 0.4 MG CAPS capsule Take 1 po QD until you pass the stone. 02/27/18   Rolland Porter, MD  verapamil (CALAN) 80 MG tablet Take 1 tablet 2 x /day with meal for BP 02/14/18   Unk Pinto, MD    Family History Family History  Problem  Relation Age of Onset  . Cirrhosis Mother   . Alcohol abuse Mother   . Liver disease Mother   . Diabetes Father   . Hypertension Father   . Stroke Father   . Heart disease Father   . AAA (abdominal aortic aneurysm) Sister   . Cancer Cousin        Breast  . Breast cancer Cousin        x3  . Colon cancer Neg Hx   . Rectal cancer Neg Hx   . Stomach cancer Neg Hx     Social History Social History   Tobacco Use  . Smoking status: Never Smoker  . Smokeless tobacco: Never Used  Substance Use Topics  . Alcohol use: No    Alcohol/week: 0.0 standard drinks  . Drug use: No  lives with spouse   Allergies   Codeine; Gabapentin; Lipitor [atorvastatin]; and Ace inhibitors   Review of Systems Review of Systems  All other systems reviewed and are negative.    Physical Exam Updated Vital Signs BP 140/73   Pulse 66   Temp 98 F (36.7 C) (Oral)   Resp 18   Ht 5' 3.5" (1.613 m)   Wt 96.6 kg   SpO2 98%   BMI 37.14 kg/m   Vital signs normal    Physical Exam Vitals signs and nursing note reviewed.  Constitutional:      Appearance: Normal appearance.  HENT:     Head: Normocephalic and atraumatic.     Right Ear: External ear normal.     Left Ear: External ear normal.     Nose: Nose normal.     Mouth/Throat:     Mouth: Mucous membranes are moist.     Pharynx: No oropharyngeal exudate or posterior oropharyngeal erythema.  Eyes:     Extraocular Movements: Extraocular movements intact.      Conjunctiva/sclera: Conjunctivae normal.     Pupils: Pupils are equal, round, and reactive to light.  Neck:     Musculoskeletal: Normal range of motion.  Cardiovascular:     Rate and Rhythm: Normal rate.  Pulmonary:     Effort: Pulmonary effort is normal. No respiratory distress.  Abdominal:     Tenderness: There is abdominal tenderness. There is no guarding or rebound.       Comments: Plus right CVA tenderness  Neurological:     Mental Status: She is alert.      ED Treatments / Results  Labs (all labs ordered are listed, but only abnormal results are displayed) Results for orders placed or performed during the hospital encounter of 56/21/30  Basic metabolic panel  Result Value Ref Range   Sodium 140 135 - 145 mmol/L   Potassium 3.9 3.5 - 5.1 mmol/L   Chloride 108 98 - 111 mmol/L   CO2 23 22 - 32 mmol/L   Glucose, Bld 118 (H) 70 - 99 mg/dL   BUN 20 8 - 23 mg/dL   Creatinine, Ser 1.26 (H) 0.44 - 1.00 mg/dL   Calcium 9.2 8.9 - 10.3 mg/dL   GFR calc non Af Amer 44 (L) >60 mL/min   GFR calc Af Amer 51 (L) >60 mL/min   Anion gap 9 5 - 15  CBC  Result Value Ref Range   WBC 9.1 4.0 - 10.5 K/uL   RBC 4.42 3.87 - 5.11 MIL/uL   Hemoglobin 13.6 12.0 - 15.0 g/dL   HCT 42.5 36.0 - 46.0 %   MCV 96.2 80.0 - 100.0 fL  MCH 30.8 26.0 - 34.0 pg   MCHC 32.0 30.0 - 36.0 g/dL   RDW 12.3 11.5 - 15.5 %   Platelets 361 150 - 400 K/uL   nRBC 0.0 0.0 - 0.2 %  Urinalysis, Routine w reflex microscopic- may I&O cath if menses  Result Value Ref Range   Color, Urine YELLOW YELLOW   APPearance CLEAR CLEAR   Specific Gravity, Urine 1.010 1.005 - 1.030   pH 7.0 5.0 - 8.0   Glucose, UA NEGATIVE NEGATIVE mg/dL   Hgb urine dipstick LARGE (A) NEGATIVE   Bilirubin Urine NEGATIVE NEGATIVE   Ketones, ur NEGATIVE NEGATIVE mg/dL   Protein, ur NEGATIVE NEGATIVE mg/dL   Nitrite NEGATIVE NEGATIVE   Leukocytes,Ua TRACE (A) NEGATIVE   RBC / HPF >50 (H) 0 - 5 RBC/hpf   WBC, UA 6-10 0 - 5 WBC/hpf    Bacteria, UA NONE SEEN NONE SEEN   Squamous Epithelial / LPF 0-5 0 - 5   Mucus PRESENT    Laboratory interpretation all normal except hematuria, ? UTI urine culture sent, new renal insufficiency    EKG None  Radiology Ct Renal Stone Study  Result Date: 02/27/2018 CLINICAL DATA:  Right flank pain. EXAM: CT ABDOMEN AND PELVIS WITHOUT CONTRAST TECHNIQUE: Multidetector CT imaging of the abdomen and pelvis was performed following the standard protocol without IV contrast. COMPARISON:  08/06/2012 FINDINGS: Lower chest: Lung bases are normal. Hepatobiliary: Prior cholecystectomy. Liver and biliary tree are normal. Pancreas: Normal. Spleen: Normal. Adrenals/Urinary Tract: Adrenal glands are normal. Kidneys are normal in size without hydronephrosis. Couple punctate stones over the lower pole collecting system of the left kidney. Subtle prominence of the proximal right ureter. There is a 3 mm stone over the distal right ureter just above the UVJ without significant obstruction. Left ureter is normal. Bladder is normal. Stomach/Bowel: Gastric lap band apparatus is in place and appears in adequate position. Small bowel is normal. Previous appendectomy. Colon is normal. Vascular/Lymphatic: Normal. Reproductive: Normal. Other: No free fluid or focal inflammatory change. Musculoskeletal: Minimal degenerative change of the spine. IMPRESSION: Minimal left-sided nephrolithiasis. 3 mm stone over the distal right ureter just above the UVJ without significant obstruction. Gastric lap band apparatus in adequate position. Electronically Signed   By: Marin Olp M.D.   On: 02/27/2018 00:56    Procedures Procedures (including critical care time)  Medications Ordered in ED Medications  ketorolac (TORADOL) 30 MG/ML injection 30 mg (30 mg Intravenous Given 02/27/18 0004)  ondansetron (ZOFRAN) injection 4 mg (4 mg Intravenous Given 02/27/18 0003)     Initial Impression / Assessment and Plan / ED Course  I have  reviewed the triage vital signs and the nursing notes.  Pertinent labs & imaging results that were available during my care of the patient were reviewed by me and considered in my medical decision making (see chart for details).     Patient describes her pain and it sounds consistent with renal stone, also consideration of UTI that has spread up to the actual kidney although she does not have fever, or more worrisome would be a tumor of her kidney.  She was given Toradol for pain, and CT renal was done to see if she has a stone.  1:15 AM patient states her pain is almost gone.  We discussed her CT results.  She was informed she had some small stones in the left kidney.  She should drink plenty of fluids, take the medications as prescribed, and she should return to  the ED if she gets fever, vomiting that is uncontrolled, or severe pain not controlled by the medications.  She was [facility] to [facility]rology.  Final Clinical Impressions(s) / ED Diagnoses   Final diagnoses:  Right ureteral stone    ED Discharge Orders         Ordered    tamsulosin (FLOMAX) 0.4 MG CAPS capsule     02/27/18 0135    ondansetron (ZOFRAN) 4 MG tablet  Every 8 hours PRN     02/27/18 0135    HYDROcodone-acetaminophen (NORCO/VICODIN) 5-325 MG tablet  Every 6 hours PRN     02/27/18 0135          Plan discharge  Rolland Porter, MD, Barbette Or, MD 02/27/18 [id number]

## 2018-02-27 NOTE — ED Notes (Signed)
Patient transported to CT 

## 2018-02-27 NOTE — Discharge Instructions (Addendum)
Strain your urine and save the stone when passed.  You can have your doctor or the urologist send it off for analysis to see what type of stone it is.  Drink plenty of fluids.  Take the medications as prescribed.  Return to the ED if you get fever, uncontrolled vomiting, or pain not controlled by the medications.  It could take several hours or several days for you to pass the stone.  If you have not passed a stone within the next week please call [facility].

## 2018-02-28 LAB — URINE CULTURE
Culture: NO GROWTH
SPECIAL REQUESTS: NORMAL

## 2018-03-09 DIAGNOSIS — N201 Calculus of ureter: Secondary | ICD-10-CM | POA: Diagnosis not present

## 2018-03-19 ENCOUNTER — Other Ambulatory Visit: Payer: Self-pay | Admitting: Internal Medicine

## 2018-03-19 ENCOUNTER — Other Ambulatory Visit: Payer: Self-pay | Admitting: Adult Health

## 2018-03-19 ENCOUNTER — Other Ambulatory Visit: Payer: Self-pay | Admitting: Oncology

## 2018-03-19 DIAGNOSIS — Z170 Estrogen receptor positive status [ER+]: Principal | ICD-10-CM

## 2018-03-19 DIAGNOSIS — C50411 Malignant neoplasm of upper-outer quadrant of right female breast: Secondary | ICD-10-CM

## 2018-03-21 DIAGNOSIS — N201 Calculus of ureter: Secondary | ICD-10-CM | POA: Diagnosis not present

## 2018-03-23 ENCOUNTER — Other Ambulatory Visit: Payer: Self-pay | Admitting: Urology

## 2018-03-23 ENCOUNTER — Other Ambulatory Visit: Payer: Self-pay

## 2018-03-23 ENCOUNTER — Encounter (HOSPITAL_BASED_OUTPATIENT_CLINIC_OR_DEPARTMENT_OTHER): Payer: Self-pay | Admitting: *Deleted

## 2018-03-23 NOTE — Progress Notes (Signed)
Spoke with patient via telephone for pre op interview.  No solids after MN but can have clear liquids until 0715 DOS. Patient verbalized understanding of clear liquid diet and no milk products. Patient to take Verapamil, Crestor and Norco and Zofran PRN AM of surgery. Will need ISTAT 4 DOS. Arrival time 1115.

## 2018-03-23 NOTE — H&P (Signed)
H&P  Chief Complaint: Kidney stone  History of Present Illness: Taylor Oconnor is a 68 y.o. year old female  Who presents at this time for cystoscopy, right retrograde ureteral pyelogram, probably ureteroscopy, possible holmium laser and extraction of right ureteral stone.  She has had a persistently symptomatic right distal ureteral stone for approximately the past month.  She was recently seen in the office with persistent pain and radiographic evidence of persistence of this  Stone.  She requests intervention.  We have discussed ureteroscopy with her, stone extraction, as well as attendant risks and complications including but not limited to infection, bleeding, ureteral trauma, anesthetic complications, the need for stent postoperatively.  She understands these and desires to proceed.  Past Medical History:  Diagnosis Date  . Allergy    SEASONAL  . Anxiety   . At risk for difficult insertion of breathing tube   . Breast cancer ([place])   . Breast cancer of upper-outer quadrant of right female breast ([place]) 12/03/2014  . 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 GERD (gastroesophageal reflux disease)    hx - resolved with lap band surgery  . H/O hiatal hernia    resolved with band surgery  . H/O laparoscopic adjustable gastric banding 2010  . Hyperlipidemia   . Hypertension   . Insomnia    tx with zanax  . Personal history of radiation therapy   . 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 Type II or unspecified type diabetes mellitus without mention of complication, not stated as uncontrolled 12/14/2012   no meds, diet controlled    Past Surgical History:  Procedure Laterality Date  . APPENDECTOMY  08/06/12   lap appy  . BACK SURGERY     cervical  . BREAST BIOPSY    . BREAST LUMPECTOMY Right 2016  . BREAST LUMPECTOMY WITH NEEDLE LOCALIZATION AND AXILLARY SENTINEL LYMPH NODE BX Right 12/28/2014   Procedure: RIGHT BREAST LUMPECTOMY WITH TWO (2) NEEDLE LOCALIZATION AND  AXILLARY SENTINEL LYMPH NODE BX;  Surgeon: Alphonsa Overall, MD;  Location: [place];  Service: General;  Laterality: Right;  . CESAREAN SECTION     x 1  . CHOLECYSTECTOMY    . COLONOSCOPY  09/29/2004   normal  . DIAGNOSTIC LAPAROSCOPY    . HERNIA REPAIR  2008  . LAPAROSCOPIC APPENDECTOMY N/A 08/06/2012   Procedure: APPENDECTOMY LAPAROSCOPIC;  Surgeon: Earnstine Regal, MD;  [facility];  Service: General;  Laterality: N/A;  . LAPAROSCOPIC GASTRIC BANDING WITH HIATAL HERNIA REPAIR N/A Nov 2008  . TUBAL LIGATION      Home Medications:  No medications prior to admission.    Allergies:  Allergies  Allergen Reactions  . Codeine Itching  . Gabapentin Other (See Comments)  . Lipitor [Atorvastatin] Other (See Comments)    Severe fatigue. ? memory loss.  . Ace Inhibitors     cough    Family History  Problem Relation Age of Onset  . Cirrhosis Mother   . Alcohol abuse Mother   . Liver disease Mother   . Diabetes Father   . Hypertension Father   . Stroke Father   . Heart disease Father   . AAA (abdominal aortic aneurysm) Sister   . Cancer Cousin        Breast  . Breast cancer Cousin        x3  . Colon cancer Neg Hx   . Rectal cancer Neg Hx   . Stomach cancer Neg  Hx     Social History:  reports that she has never smoked. She has never used smokeless tobacco. She reports that she does not drink alcohol or use drugs.  ROS: A complete review of systems was performed.  All systems are negative except for pertinent findings as noted.  Physical Exam:  Vital signs in last 24 hours: Weight:  [95.3 kg] 95.3 kg (03/11 1004) General:  Alert and oriented, No acute distress HEENT: Normocephalic, atraumatic Neck: No JVD or lymphadenopathy Cardiovascular: Regular rate and rhythm Lungs: Clear bilaterally Abdomen: Soft, nontender, nondistended, no abdominal masses Back: No CVA tenderness Extremities: No edema Neurologic: Grossly intact  Laboratory Data:  No results  found for this or any previous visit (from the past 24 hour(s)). No results found for this or any previous visit (from the past 240 hour(s)). Creatinine: No results for input(s): CREATININE in the last 168 hours.  Radiologic Imaging: No results found.  Impression/Assessment:    Right distal ureteral stone with persistent symptoms and non passage  Plan:   cystoscopy, right retrograde ureteral pyelogram, ureteroscopy, possible holmium laser and extraction of stone, possible double-J stent placement  [name] 03/23/2018, 8:45 PM  Lillette Boxer. Toribio Seiber MD

## 2018-03-24 ENCOUNTER — Ambulatory Visit (HOSPITAL_BASED_OUTPATIENT_CLINIC_OR_DEPARTMENT_OTHER)
Admission: RE | Admit: 2018-03-24 | Discharge: 2018-03-24 | Disposition: A | Discharge status: Home / Self Care | Payer: Medicare Other | Attending: Urology | Admitting: Urology

## 2018-03-24 ENCOUNTER — Encounter (HOSPITAL_BASED_OUTPATIENT_CLINIC_OR_DEPARTMENT_OTHER): Payer: Self-pay

## 2018-03-24 ENCOUNTER — Ambulatory Visit (HOSPITAL_BASED_OUTPATIENT_CLINIC_OR_DEPARTMENT_OTHER): Payer: Medicare Other | Admitting: Anesthesiology

## 2018-03-24 ENCOUNTER — Encounter (HOSPITAL_BASED_OUTPATIENT_CLINIC_OR_DEPARTMENT_OTHER)
Admission: RE | Disposition: A | Discharge status: Home / Self Care | Payer: Self-pay | Source: Home / Self Care | Attending: Urology

## 2018-03-24 DIAGNOSIS — Z803 Family history of malignant neoplasm of breast: Secondary | ICD-10-CM | POA: Diagnosis not present

## 2018-03-24 DIAGNOSIS — Z853 Personal history of malignant neoplasm of breast: Secondary | ICD-10-CM | POA: Diagnosis not present

## 2018-03-24 DIAGNOSIS — F419 Anxiety disorder, unspecified: Secondary | ICD-10-CM | POA: Diagnosis not present

## 2018-03-24 DIAGNOSIS — K219 Gastro-esophageal reflux disease without esophagitis: Secondary | ICD-10-CM | POA: Insufficient documentation

## 2018-03-24 DIAGNOSIS — K449 Diaphragmatic hernia without obstruction or gangrene: Secondary | ICD-10-CM | POA: Diagnosis not present

## 2018-03-24 DIAGNOSIS — E1122 Type 2 diabetes mellitus with diabetic chronic kidney disease: Secondary | ICD-10-CM | POA: Diagnosis not present

## 2018-03-24 DIAGNOSIS — Z885 Allergy status to narcotic agent status: Secondary | ICD-10-CM | POA: Diagnosis not present

## 2018-03-24 DIAGNOSIS — N201 Calculus of ureter: Secondary | ICD-10-CM | POA: Insufficient documentation

## 2018-03-24 DIAGNOSIS — Z823 Family history of stroke: Secondary | ICD-10-CM | POA: Insufficient documentation

## 2018-03-24 DIAGNOSIS — Z8379 Family history of other diseases of the digestive system: Secondary | ICD-10-CM | POA: Diagnosis not present

## 2018-03-24 DIAGNOSIS — N182 Chronic kidney disease, stage 2 (mild): Secondary | ICD-10-CM | POA: Insufficient documentation

## 2018-03-24 DIAGNOSIS — Z923 Personal history of irradiation: Secondary | ICD-10-CM | POA: Insufficient documentation

## 2018-03-24 DIAGNOSIS — Z888 Allergy status to other drugs, medicaments and biological substances status: Secondary | ICD-10-CM | POA: Insufficient documentation

## 2018-03-24 DIAGNOSIS — Z9049 Acquired absence of other specified parts of digestive tract: Secondary | ICD-10-CM | POA: Diagnosis not present

## 2018-03-24 DIAGNOSIS — G4700 Insomnia, unspecified: Secondary | ICD-10-CM | POA: Diagnosis not present

## 2018-03-24 DIAGNOSIS — I10 Essential (primary) hypertension: Secondary | ICD-10-CM | POA: Diagnosis not present

## 2018-03-24 DIAGNOSIS — Z8249 Family history of ischemic heart disease and other diseases of the circulatory system: Secondary | ICD-10-CM | POA: Diagnosis not present

## 2018-03-24 DIAGNOSIS — Z811 Family history of alcohol abuse and dependence: Secondary | ICD-10-CM | POA: Insufficient documentation

## 2018-03-24 DIAGNOSIS — E785 Hyperlipidemia, unspecified: Secondary | ICD-10-CM | POA: Diagnosis not present

## 2018-03-24 DIAGNOSIS — N189 Chronic kidney disease, unspecified: Secondary | ICD-10-CM | POA: Diagnosis not present

## 2018-03-24 DIAGNOSIS — I129 Hypertensive chronic kidney disease with stage 1 through stage 4 chronic kidney disease, or unspecified chronic kidney disease: Secondary | ICD-10-CM | POA: Insufficient documentation

## 2018-03-24 HISTORY — DX: Bariatric surgery status: Z98.84

## 2018-03-24 HISTORY — PX: CYSTOSCOPY WITH RETROGRADE PYELOGRAM, URETEROSCOPY AND STENT PLACEMENT: SHX5789

## 2018-03-24 LAB — POCT I-STAT 4, (NA,K, GLUC, HGB,HCT)
Glucose, Bld: 110 mg/dL — ABNORMAL HIGH (ref 70–99)
HCT: 40 % (ref 36.0–46.0)
Hemoglobin: 13.6 g/dL (ref 12.0–15.0)
Potassium: 4 mmol/L (ref 3.5–5.1)
Sodium: 141 mmol/L (ref 135–145)

## 2018-03-24 LAB — GLUCOSE, CAPILLARY: Glucose-Capillary: 87 mg/dL (ref 70–99)

## 2018-03-24 SURGERY — CYSTOURETEROSCOPY, WITH RETROGRADE PYELOGRAM AND STENT INSERTION
Anesthesia: General | Site: Ureter | Laterality: Right

## 2018-03-24 MED ORDER — LIDOCAINE HCL (CARDIAC) PF 100 MG/5ML IV SOSY
PREFILLED_SYRINGE | INTRAVENOUS | Status: DC | PRN
  Administered 2018-03-24: 50 mg via INTRAVENOUS

## 2018-03-24 MED ORDER — ONDANSETRON HCL 4 MG/2ML IJ SOLN
INTRAMUSCULAR | Status: AC
  Filled 2018-03-24: qty 2

## 2018-03-24 MED ORDER — ACETAMINOPHEN 500 MG PO TABS
ORAL_TABLET | ORAL | Status: AC
  Filled 2018-03-24: qty 2

## 2018-03-24 MED ORDER — CEFAZOLIN SODIUM-DEXTROSE 2-4 GM/100ML-% IV SOLN
2.0000 g | INTRAVENOUS | Status: AC
  Administered 2018-03-24: 2 g via INTRAVENOUS
  Filled 2018-03-24: qty 100

## 2018-03-24 MED ORDER — CEPHALEXIN 500 MG PO CAPS: 500.0000 mg | ORAL_CAPSULE | Freq: Two times a day (BID) | ORAL | 0 refills | Status: DC

## 2018-03-24 MED ORDER — EPHEDRINE 5 MG/ML INJ
INTRAVENOUS | Status: AC
  Filled 2018-03-24: qty 10

## 2018-03-24 MED ORDER — OXYBUTYNIN CHLORIDE 5 MG PO TABS: 5.0000 mg | ORAL_TABLET | Freq: Three times a day (TID) | ORAL | 1 refills | Status: DC | PRN

## 2018-03-24 MED ORDER — LIDOCAINE 2% (20 MG/ML) 5 ML SYRINGE
INTRAMUSCULAR | Status: AC
  Filled 2018-03-24: qty 5

## 2018-03-24 MED ORDER — PROPOFOL 10 MG/ML IV BOLUS
INTRAVENOUS | Status: DC | PRN
  Administered 2018-03-24: 50 mg via INTRAVENOUS
  Administered 2018-03-24: 150 mg via INTRAVENOUS

## 2018-03-24 MED ORDER — ACETAMINOPHEN 500 MG PO TABS
1000.0000 mg | ORAL_TABLET | Freq: Once | ORAL | Status: AC
  Administered 2018-03-24: 1000 mg via ORAL
  Filled 2018-03-24: qty 2

## 2018-03-24 MED ORDER — EPHEDRINE SULFATE 50 MG/ML IJ SOLN
INTRAMUSCULAR | Status: DC | PRN
  Administered 2018-03-24: 10 mg via INTRAVENOUS

## 2018-03-24 MED ORDER — PHENYLEPHRINE 40 MCG/ML (10ML) SYRINGE FOR IV PUSH (FOR BLOOD PRESSURE SUPPORT)
PREFILLED_SYRINGE | INTRAVENOUS | Status: AC
  Filled 2018-03-24: qty 10

## 2018-03-24 MED ORDER — SODIUM CHLORIDE 0.9 % IR SOLN
Status: DC | PRN
  Administered 2018-03-24: 3000 mL

## 2018-03-24 MED ORDER — LACTATED RINGERS IV SOLN
INTRAVENOUS | Status: DC
  Administered 2018-03-24 (×2): via INTRAVENOUS
  Filled 2018-03-24: qty 1000

## 2018-03-24 MED ORDER — FENTANYL CITRATE (PF) 100 MCG/2ML IJ SOLN
INTRAMUSCULAR | Status: DC | PRN
  Administered 2018-03-24: 25 ug via INTRAVENOUS
  Administered 2018-03-24: 50 ug via INTRAVENOUS
  Administered 2018-03-24: 25 ug via INTRAVENOUS

## 2018-03-24 MED ORDER — FENTANYL CITRATE (PF) 100 MCG/2ML IJ SOLN
INTRAMUSCULAR | Status: AC
  Filled 2018-03-24: qty 2

## 2018-03-24 MED ORDER — CEFAZOLIN SODIUM-DEXTROSE 2-4 GM/100ML-% IV SOLN
INTRAVENOUS | Status: AC
  Filled 2018-03-24: qty 100

## 2018-03-24 MED ORDER — MIDAZOLAM HCL 2 MG/2ML IJ SOLN
INTRAMUSCULAR | Status: DC | PRN
  Administered 2018-03-24: 1 mg via INTRAVENOUS

## 2018-03-24 MED ORDER — DEXAMETHASONE SODIUM PHOSPHATE 10 MG/ML IJ SOLN
INTRAMUSCULAR | Status: AC
  Filled 2018-03-24: qty 1

## 2018-03-24 MED ORDER — ONDANSETRON HCL 4 MG/2ML IJ SOLN
INTRAMUSCULAR | Status: DC | PRN
  Administered 2018-03-24: 4 mg via INTRAVENOUS

## 2018-03-24 MED ORDER — GLYCOPYRROLATE PF 0.2 MG/ML IJ SOSY
PREFILLED_SYRINGE | INTRAMUSCULAR | Status: AC
  Filled 2018-03-24: qty 2

## 2018-03-24 MED ORDER — PHENYLEPHRINE HCL 10 MG/ML IJ SOLN
INTRAMUSCULAR | Status: DC | PRN
  Administered 2018-03-24: 40 ug via INTRAVENOUS

## 2018-03-24 MED ORDER — IOHEXOL 300 MG/ML  SOLN
INTRAMUSCULAR | Status: DC | PRN
  Administered 2018-03-24: 10 mL via URETHRAL

## 2018-03-24 MED ORDER — FENTANYL CITRATE (PF) 100 MCG/2ML IJ SOLN
25.0000 ug | INTRAMUSCULAR | Status: DC | PRN
  Filled 2018-03-24: qty 1

## 2018-03-24 MED ORDER — MIDAZOLAM HCL 2 MG/2ML IJ SOLN
INTRAMUSCULAR | Status: AC
  Filled 2018-03-24: qty 2

## 2018-03-24 SURGICAL SUPPLY — 33 items
BAG DRAIN URO-CYSTO SKYTR STRL (DRAIN) ×4 IMPLANT
BAG DRN UROCATH (DRAIN) ×2
BASKET STONE 1.7 NGAGE (UROLOGICAL SUPPLIES) ×3 IMPLANT
BASKET ZERO TIP NITINOL 2.4FR (BASKET) IMPLANT
BSKT STON RTRVL ZERO TP 2.4FR (BASKET)
CATH INTERMIT  6FR 70CM (CATHETERS) ×4 IMPLANT
CLOTH BEACON ORANGE TIMEOUT ST (SAFETY) ×4 IMPLANT
ELECT REM PT RETURN 9FT ADLT (ELECTROSURGICAL)
ELECTRODE REM PT RTRN 9FT ADLT (ELECTROSURGICAL) IMPLANT
FIBER LASER FLEXIVA 200 (UROLOGICAL SUPPLIES) IMPLANT
FIBER LASER FLEXIVA 365 (UROLOGICAL SUPPLIES) IMPLANT
FIBER LASER TRAC TIP (UROLOGICAL SUPPLIES) IMPLANT
GLOVE BIO SURGEON STRL SZ 6.5 (GLOVE) ×2 IMPLANT
GLOVE BIO SURGEON STRL SZ8 (GLOVE) ×4 IMPLANT
GLOVE BIO SURGEONS STRL SZ 6.5 (GLOVE) ×1
GLOVE BIOGEL PI IND STRL 6.5 (GLOVE) ×1 IMPLANT
GLOVE BIOGEL PI INDICATOR 6.5 (GLOVE) ×2
GOWN STRL REUS W/ TWL XL LVL3 (GOWN DISPOSABLE) ×2 IMPLANT
GOWN STRL REUS W/TWL LRG LVL3 (GOWN DISPOSABLE) ×3 IMPLANT
GOWN STRL REUS W/TWL XL LVL3 (GOWN DISPOSABLE) ×4
GUIDEWIRE ANG ZIPWIRE 038X150 (WIRE) IMPLANT
GUIDEWIRE STR DUAL SENSOR (WIRE) ×3 IMPLANT
IV NS IRRIG 3000ML ARTHROMATIC (IV SOLUTION) ×5 IMPLANT
KIT TURNOVER CYSTO (KITS) ×4 IMPLANT
MANIFOLD NEPTUNE II (INSTRUMENTS) ×3 IMPLANT
NS IRRIG 500ML POUR BTL (IV SOLUTION) ×4 IMPLANT
PACK CYSTO (CUSTOM PROCEDURE TRAY) ×4 IMPLANT
SHEATH ACCESS URETERAL 38CM (SHEATH) IMPLANT
SHEATH URETERAL 12FRX28CM (UROLOGICAL SUPPLIES) ×3 IMPLANT
STENT URET 6FRX24 CONTOUR (STENTS) ×3 IMPLANT
TUBE CONNECTING 12'X1/4 (SUCTIONS) ×1
TUBE CONNECTING 12X1/4 (SUCTIONS) ×2 IMPLANT
TUBING UROLOGY SET (TUBING) ×3 IMPLANT

## 2018-03-24 NOTE — Anesthesia Preprocedure Evaluation (Addendum)
Anesthesia Evaluation  Patient identified by MRN, date of birth, ID band Patient awake    Reviewed: Allergy & Precautions, NPO status , Patient's Chart, lab work & pertinent test results, reviewed documented beta blocker date and time   History of Anesthesia Complications (+) DIFFICULT AIRWAY and history of anesthetic complications  Airway Mallampati: III  TM Distance: >3 FB Neck ROM: Full    Dental no notable dental hx. (+) Teeth Intact, Dental Advisory Given   Pulmonary neg pulmonary ROS,    Pulmonary exam normal breath sounds clear to auscultation       Cardiovascular hypertension, Pt. on medications and Pt. on home beta blockers negative cardio ROS Normal cardiovascular exam Rhythm:Regular Rate:Normal     Neuro/Psych PSYCHIATRIC DISORDERS Anxiety negative neurological ROS     GI/Hepatic Neg liver ROS, hiatal hernia, GERD  ,  Endo/Other  negative endocrine ROSdiabetes, Well Controlled, Type 2  Renal/GU Renal InsufficiencyRenal disease  negative genitourinary   Musculoskeletal negative musculoskeletal ROS (+)   Abdominal   Peds negative pediatric ROS (+)  Hematology negative hematology ROS (+)   Anesthesia Other Findings Right ureteral stone  Reproductive/Obstetrics negative OB ROS                            Anesthesia Physical Anesthesia Plan  ASA: III  Anesthesia Plan: General   Post-op Pain Management:    Induction: Intravenous  PONV Risk Score and Plan: 3 and Midazolam, Dexamethasone and Ondansetron  Airway Management Planned: LMA  Additional Equipment:   Intra-op Plan:   Post-operative Plan: Extubation in OR  Informed Consent: I have reviewed the patients History and Physical, chart, labs and discussed the procedure including the risks, benefits and alternatives for the proposed anesthesia with the patient or authorized representative who has indicated his/her  understanding and acceptance.     Dental advisory given  Plan Discussed with: CRNA  Anesthesia Plan Comments:        Anesthesia Quick Evaluation

## 2018-03-24 NOTE — Anesthesia Procedure Notes (Signed)
Procedure Name: LMA Insertion Date/Time: 03/24/2018 1:38 PM Performed by: Georgeanne Nim, CRNA Pre-anesthesia Checklist: Patient identified, Emergency Drugs available, Suction available, Patient being monitored and Timeout performed Patient Re-evaluated:Patient Re-evaluated prior to induction Oxygen Delivery Method: Circle system utilized Preoxygenation: Pre-oxygenation with 100% oxygen Induction Type: IV induction Ventilation: Mask ventilation without difficulty LMA: LMA inserted LMA Size: 4.0 Number of attempts: 1 Placement Confirmation: positive ETCO2,  CO2 detector and breath sounds checked- equal and bilateral Tube secured with: Tape Dental Injury: Teeth and Oropharynx as per pre-operative assessment

## 2018-03-24 NOTE — Discharge Instructions (Signed)
1. You may see some blood in the urine and may have some burning with urination for 48-72 hours. You also may notice that you have to urinate more frequently or urgently after your procedure which is normal.  2. You should call should you develop an inability urinate, fever > 101, persistent nausea and vomiting that prevents you from eating or drinking to stay hydrated.  3. If you have a stent, you will likely urinate more frequently and urgently until the stent is removed and you may experience some discomfort/pain in the lower abdomen and flank especially when urinating. You may take pain medication prescribed to you if needed for pain. You may also intermittently have blood in the urine until the stent is removed.  It is okay to pull the threads to remove the stent on Monday morning, March 16    [facility] [phone] Post Ureteroscopy With or Without Stent Instructions  Definitions:  Ureter: The duct that transports urine from the kidney to the bladder. Stent:   A plastic hollow tube that is placed into the ureter, from the kidney to the                 bladder to prevent the ureter from swelling shut.  GENERAL INSTRUCTIONS:  Despite the fact that no skin incisions were used, the area around the ureter and bladder is raw and irritated. The stent is a foreign body which will further irritate the bladder wall. This irritation is manifested by increased frequency of urination, both day and night, and by an increase in the urge to urinate. In some, the urge to urinate is present almost always. Sometimes the urge is strong enough that you may not be able to stop yourself from urinating. The only real cure is to remove the stent and then give time for the bladder wall to heal which can't be done until the danger of the ureter swelling shut has passed, which varies.  You may see some blood in your urine while the stent is in place and a few days afterwards. Do not be alarmed, even  if the urine was clear for a while. Get off your feet and drink lots of fluids until clearing occurs. If you start to pass clots or don't improve, call us.  DIET: You may return to your normal diet immediately. Because of the raw surface of your bladder, alcohol, spicy foods, acid type foods and drinks with caffeine may cause irritation or frequency and should be used in moderation. To keep your urine flowing freely and to avoid constipation, drink plenty of fluids during the day ( 8-10 glasses ). Tip: Avoid cranberry juice because it is very acidic.  ACTIVITY: Your physical activity doesn't need to be restricted. However, if you are very active, you may see some blood in your urine. We suggest that you reduce your activity under these circumstances until the bleeding has stopped.  BOWELS: It is important to keep your bowels regular during the postoperative period. Straining with bowel movements can cause bleeding. A bowel movement every other day is reasonable. Use a mild laxative if needed, such as Milk of Magnesia 2-3 tablespoons, or 2 Dulcolax tablets. Call if you continue to have problems. If you have been taking narcotics for pain, before, during or after your surgery, you may be constipated. Take a laxative if necessary.   MEDICATION: You should resume your pre-surgery medications unless told not to. In addition you will often be given an antibiotic to  prevent infection. These should be taken as prescribed until the bottles are finished unless you are having an unusual reaction to one of the drugs.  PROBLEMS YOU SHOULD REPORT TO [place]:  Fevers over 100.5 Fahrenheit.  Heavy bleeding, or clots ( See above notes about blood in urine ).  Inability to urinate.  Drug reactions ( hives, rash, nausea, vomiting, diarrhea ).  Severe burning or pain with urination that is not improving.  FOLLOW-UP: You will need a follow-up appointment to monitor your progress. Call for this appointment at the  number listed above. Usually the first appointment will be about three to fourteen days after your surgery.      Post Anesthesia Home Care Instructions  Activity: Get plenty of rest for the remainder of the day. A responsible adult should stay with you for 24 hours following the procedure.  For the next 24 hours, DO NOT: -Drive a car -Paediatric nurse -Drink alcoholic beverages -Take any medication unless instructed by your physician -Make any legal decisions or sign important papers.  Meals: Start with liquid foods such as gelatin or soup. Progress to regular foods as tolerated. Avoid greasy, spicy, heavy foods. If nausea and/or vomiting occur, drink only clear liquids until the nausea and/or vomiting subsides. Call your physician if vomiting continues.  Special Instructions/Symptoms: Your throat may feel dry or sore from the anesthesia or the breathing tube placed in your throat during surgery. If this causes discomfort, gargle with warm salt water. The discomfort should disappear within 24 hours.  If you had a scopolamine patch placed behind your ear for the management of post- operative nausea and/or vomiting:  1. The medication in the patch is effective for 72 hours, after which it should be removed.  Wrap patch in a tissue and discard in the trash. Wash hands thoroughly with soap and water. 2. You may remove the patch earlier than 72 hours if you experience unpleasant side effects which may include dry mouth, dizziness or visual disturbances. 3. Avoid touching the patch. Wash your hands with soap and water after contact with the patch.

## 2018-03-24 NOTE — Anesthesia Postprocedure Evaluation (Signed)
Anesthesia Post Note  Patient: Taylor Oconnor  Procedure(s) Performed: CYSTOSCOPY WITH RETROGRADE PYELOGRAM, URETEROSCOPY AND STENT PLACEMENT (Right Ureter)     Patient location during evaluation: PACU Anesthesia Type: General Level of consciousness: awake and alert Pain management: pain level controlled Vital Signs Assessment: post-procedure vital signs reviewed and stable Respiratory status: spontaneous breathing, nonlabored ventilation, respiratory function stable and patient connected to nasal cannula oxygen Cardiovascular status: blood pressure returned to baseline and stable Postop Assessment: no apparent nausea or vomiting Anesthetic complications: no    Last Vitals:  Vitals:   03/24/18 1450 03/24/18 1600  BP:  140/64  Pulse: 74 63  Resp: 13 12  Temp: 36.6 C 36.7 C  SpO2: 98% 97%    Last Pain:  Vitals:   03/24/18 1545  TempSrc:   PainSc: 3                  Vyron Fronczak L Demere Dotzler

## 2018-03-24 NOTE — Interval H&P Note (Signed)
History and Physical Interval Note:  03/24/2018 1:09 PM  Taylor Oconnor  has presented today for surgery, with the diagnosis of RIGHT URETERAL CALCULUS.  The various methods of treatment have been discussed with the patient and family. After consideration of risks, benefits and other options for treatment, the patient has consented to  Procedure(s) with comments: CYSTOSCOPY WITH RETROGRADE PYELOGRAM, URETEROSCOPY AND STENT PLACEMENT (Right) - 1 HR HOLMIUM LASER APPLICATION (Right) as a surgical intervention.  The patient's history has been reviewed, patient examined, no change in status, stable for surgery.  I have reviewed the patient's chart and labs.  Questions were answered to the patient's satisfaction.     Lillette Boxer Alizia Greif

## 2018-03-24 NOTE — Transfer of Care (Signed)
Immediate Anesthesia Transfer of Care Note  Patient: Taylor Oconnor  Procedure(s) Performed: CYSTOSCOPY WITH RETROGRADE PYELOGRAM, URETEROSCOPY AND STENT PLACEMENT (Right Ureter)  Patient Location: PACU  Anesthesia Type:General  Level of Consciousness: awake, alert , oriented and patient cooperative  Airway & Oxygen Therapy: Patient Spontanous Breathing  Post-op Assessment: Report given to RN and Post -op Vital signs reviewed and stable  Post vital signs: Reviewed and stable  Last Vitals:  Vitals Value Taken Time  BP 128/46 03/24/2018  2:11 PM  Temp    Pulse 80 03/24/2018  2:13 PM  Resp 14 03/24/2018  2:13 PM  SpO2 96 % 03/24/2018  2:13 PM  Vitals shown include unvalidated device data.  Last Pain:  Vitals:   03/24/18 1127  TempSrc:   PainSc: 0-No pain      Patients Stated Pain Goal: 5 (98/92/11 9417)  Complications: No apparent anesthesia complications

## 2018-03-24 NOTE — Op Note (Signed)
Preoperative diagnosis: Right distal ureteral stone  Postoperative diagnosis: Same  Principal procedure: Cystoscopy, right retrograde ureteropyelogram, fluoroscopic interpretation, right ureteroscopy, extraction of right distal ureteral stone, placement of 6 French by 24 cm contour double-J stent with tether  Surgeon: Jhair Witherington  Anesthesia: General with LMA  Complications: None  Estimated blood loss: None  Specimen: Stone  Indications: 68 year old female with persistently symptomatic right distal ureteral stone.  This was diagnosed approximately 1 month ago.  She is attempted medical expulsive therapy, but recently has had persistent pain and desires management.  I have discussed ureteroscopy with the patient.  Risks and complications of this procedure have been discussed as well by both Azucena Fallen, NP and myself.  She understands these and desires to proceed.  Description of procedure: The patient was properly identified in the holding area.  She was marked on the right side and received preoperative IV antibiotics.  She was taken to the operating room where general anesthetic was administered.  She was placed in the dorsolithotomy position.  Genitalia and perineum were prepped and draped.  Proper timeout was performed.  A 21 French panendoscope was advanced into the bladder.  Bladder was inspected circumferentially with normal findings.  Ureteral orifice ease were normal in configuration and location.  The right ureter was cannulated with a 6 [place] open-ended catheter.  Retrograde ureteropyelogram was performed.  This revealed possible small filling defect at the UVJ.  Otherwise, the entire right ureter and pyelocalyceal system were normal in caliber without filling defects, hydronephrosis, pyelocaliectasis.  Following this, guidewire was advanced through the open-ended catheter to the renal pelvis.  The open-ended catheter and cystoscope were then removed.  I dilated the distal ureter  first with the obturator and then the entire 12/14 ureteral access catheter.  6 French dual-lumen semirigid ureteroscope was advanced into the ureter where the stone was seen.  It was grasped with the engage basket and extracted without difficulty.  The ureter was then fully inspected with the ureteroscope, from the [place]. [place] to the UPJ without other stones being seen.  There was no CT evidence of right renal calculi.  Because of the mild trauma from dilation as well as the persistent stone in the distal ureter with some stenosis, I felt it worthwhile to place a stent.  The cystoscope was replaced over top the guidewire, and using fluoroscopic and cystoscopic guidance, a 6 [place] by 24 cm contour double-J stent with the tether left on was deployed in the ureter once the guidewire was removed.  Good proximal and distal curls were seen on fluoroscopy and cystoscopy, respectively.  The bladder was then drained and the scope removed.  The tether was trimmed at the urethral meatus, tied and then passed into the vagina.  At this point the procedure was terminated.  The patient was then awakened and taken to the PACU in stable condition.  She tolerated the procedure well.

## 2018-03-25 ENCOUNTER — Encounter (HOSPITAL_BASED_OUTPATIENT_CLINIC_OR_DEPARTMENT_OTHER): Payer: Self-pay | Admitting: Urology

## 2018-03-29 ENCOUNTER — Other Ambulatory Visit: Payer: Self-pay | Admitting: Oncology

## 2018-03-31 DIAGNOSIS — N201 Calculus of ureter: Secondary | ICD-10-CM | POA: Diagnosis not present

## 2018-04-04 MED ORDER — ALPRAZOLAM 1 MG PO TABS: ORAL_TABLET | ORAL | 0 refills | Status: DC

## 2018-05-02 ENCOUNTER — Encounter: Payer: Self-pay | Admitting: Internal Medicine

## 2018-05-02 NOTE — Progress Notes (Signed)
[facility] Unk Pinto, M.D.     Uvaldo Bristle. Silverio Lay, P.A.-C Liane Comber, [place] [address] [place] [address] Telephone [phone] Telefax [phone] Annual Screening/Preventative Visit & Comprehensive Evaluation &  Examination  History of Present Illness:       This very nice 68 y.o. MWF presents for a Screening /Preventative Visit & comprehensive evaluation and management of multiple medical co-morbidities.  Patient has been followed for HTN, HLD, T2_NIDDM  and Vitamin D Deficiency.     In 12/2014 she had a Rt Breast (ER+) treated po with radiation and follows with Dr [name] on Anastrozole.      In March, patient underwent Cysto,  Rt Ureteroscopy w Joaquim Lai extraction by Dr Diona Fanti.      HTN predates circa 1995. Patient's BP has been controlled at home and patient denies any cardiac symptoms as chest pain, palpitations, shortness of breath, dizziness or ankle swelling. Today's BP is at goal - 136/82.      Patient's hyperlipidemia is controlled with diet and medications. Patient denies myalgias or other medication SE's. Last lipids were at goal: Lab Results  Component Value Date   CHOL 138 01/17/2018   HDL 68 01/17/2018   LDLCALC 49 01/17/2018   TRIG 128 01/17/2018   CHOLHDL 2.0 01/17/2018      Patient has hx/o  prediabetes since 2008,  then T2_DM from 2012 - 2016. In 2011 patient underwent Gastric Lap band Bariatric surg and lost 60 # from 229# down to 186 # and then gained it all back. In 2016 she was able to wean off of her Metformin and has remained off.   Current weight 228# and BMI 38+. She denies reactive hypoglycemic symptoms, visual blurring, diabetic polys or paresthesias. Last A1c was not at goal; Lab Results  Component Value Date   HGBA1C 6.1 (H) 01/17/2018      Finally, patient has history of Vitamin D Deficiency ("28" / 2008)  and last Vitamin D was at goal: Lab  Results  Component Value Date   VD25OH 85 10/05/2017   Current Outpatient Medications on File Prior to Visit  Medication Sig  . acidophilus (RISAQUAD) CAPS Take 1 capsule by mouth daily. Reported on 02/19/2015  . anastrozole (ARIMIDEX) 1 MG tablet TAKE 1 TABLET BY MOUTH EVERY DAY  . aspirin 81 MG tablet Take 81 mg by mouth daily.  Marland Kitchen atenolol (TENORMIN) 100 MG tablet TAKE 1 TABLET BY MOUTH EVERY DAY  . b complex vitamins capsule Take 1 capsule by mouth daily.  . calcium carbonate (TUMS) 500 MG chewable tablet Chew 1,000 mg by mouth at bedtime.   . Cholecalciferol (VITAMIN D3) 5000 UNITS CAPS Take 5,000 Units by mouth 2 (two) times daily.   . Cinnamon 500 MG TABS Take 1 tablet by mouth daily. Cinnamon 1000 mg, 1 tab in morning and 1 tab evening   . diclofenac sodium (VOLTAREN) 1 % GEL Apply to affected area 4 x /day as needed  . Magnesium 250 MG TABS Take 250 mg by mouth 2 (two) times daily.  . Multiple Vitamins-Minerals (MULTIVITAMIN WITH MINERALS) tablet Take 1 tablet by mouth 2 (two) times daily.  . Omega-3 Fatty Acids (FISH OIL) 1000 MG CAPS Take 1,000 mg by mouth daily.  . ondansetron (ZOFRAN) 4 MG tablet Take 1 tablet (4 mg total) by mouth every 8 (eight) hours as needed for nausea or vomiting.  Marland Kitchen OVER THE COUNTER MEDICATION Take 1 tablet by mouth  2 (two) times daily. Tumeric with black pepper  . rosuvastatin (CRESTOR) 40 MG tablet Take 1 tablet daily for Cholesterol  . verapamil (CALAN) 80 MG tablet Take 1 tablet 2 x /day with meal for BP  . HYDROcodone-acetaminophen (NORCO/VICODIN) 5-325 MG tablet Take 1 tablet by mouth every 6 (six) hours as needed. (Patient not taking: Reported on 05/03/2018)   No current facility-administered medications on file prior to visit.    Allergies  Allergen Reactions  . Codeine Itching  . Gabapentin Other (See Comments)  . Lipitor [Atorvastatin] Other (See Comments)    Severe fatigue. ? memory loss.  . Ace Inhibitors     cough   Past Medical  History:  Diagnosis Date  . Allergy    SEASONAL  . Anxiety   . At risk for difficult insertion of breathing tube   . Breast cancer ([place])   . Breast cancer of upper-outer quadrant of right female breast ([place]) 12/03/2014  . 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 GERD (gastroesophageal reflux disease)    hx - resolved with lap band surgery  . H/O hiatal hernia    resolved with band surgery  . H/O laparoscopic adjustable gastric banding 2010  . Hyperlipidemia   . Hypertension   . Insomnia    tx with zanax  . Personal history of radiation therapy   . 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 Type II or unspecified type diabetes mellitus without mention of complication, not stated as uncontrolled 12/14/2012   no meds, diet controlled   Health Maintenance  Topic Date Due  . URINE MICROALBUMIN  04/01/2018  . INFLUENZA VACCINE  08/13/2018  . MAMMOGRAM  12/01/2019  . TETANUS/TDAP  01/04/2024  . COLONOSCOPY  12/16/2024  . DEXA SCAN  Completed  . Hepatitis C Screening  Completed  . PNA vac Low Risk Adult  Completed   Immunization History  Administered Date(s) Administered  . DT 12/18/2013  . DTaP 01/12/2002  . Influenza Split 12/14/2012, 11/19/2013, 10/27/2014  . Influenza, High Dose Seasonal PF 10/05/2017  . Influenza-Unspecified 10/22/2015, 10/25/2016  . PPD Test 12/14/2012, 12/18/2013, 01/24/2015, 02/20/2016  . Pneumococcal Conjugate-13 10/09/2013  . Pneumococcal Polysaccharide-23 01/13/1992, 03/31/2017   Last Colon - 12/17/2014 - Dr Silverio Decamp hyperplastic polyp recc 10 yr f/u 12/2024  Last MGM - 11/30/2017  Past Surgical History:  Procedure Laterality Date  . APPENDECTOMY  08/06/12   lap appy  . BACK SURGERY     cervical  . BREAST BIOPSY    . BREAST LUMPECTOMY Right 2016  . BREAST LUMPECTOMY WITH NEEDLE LOCALIZATION AND AXILLARY SENTINEL LYMPH NODE BX Right 12/28/2014   Procedure: RIGHT BREAST LUMPECTOMY WITH TWO (2) NEEDLE LOCALIZATION AND AXILLARY  SENTINEL LYMPH NODE BX;  Surgeon: Alphonsa Overall, MD;  Location: [facility];  Service: General;  Laterality: Right;  . CESAREAN SECTION     x 1  . CHOLECYSTECTOMY    . COLONOSCOPY  09/29/2004   normal  . CYSTOSCOPY WITH RETROGRADE PYELOGRAM, URETEROSCOPY AND STENT PLACEMENT Right 03/24/2018   Procedure: CYSTOSCOPY WITH RETROGRADE PYELOGRAM, URETEROSCOPY AND STENT PLACEMENT;  Surgeon: Franchot Gallo, MD;  Location: [facility];  Service: Urology;  Laterality: Right;  1 HR  . DIAGNOSTIC LAPAROSCOPY    . HERNIA REPAIR  2008  . LAPAROSCOPIC APPENDECTOMY N/A 08/06/2012   Procedure: APPENDECTOMY LAPAROSCOPIC;  Surgeon: Earnstine Regal, MD;  Location: [facility];  Service: General;  Laterality: N/A;  . LAPAROSCOPIC GASTRIC BANDING WITH HIATAL  HERNIA REPAIR N/A Nov 2008  . TUBAL LIGATION     Family History  Problem Relation Age of Onset  . Cirrhosis Mother   . Alcohol abuse Mother   . Liver disease Mother   . Diabetes Father   . Hypertension Father   . Stroke Father   . Heart disease Father   . AAA (abdominal aortic aneurysm) Sister   . Cancer Cousin        Breast  . Breast cancer Cousin        x3  . Colon cancer Neg Hx   . Rectal cancer Neg Hx   . Stomach cancer Neg Hx    Social History   Tobacco Use  . Smoking status: Never Smoker  . Smokeless tobacco: Never Used  Substance Use Topics  . Alcohol use: No    Alcohol/week: 0.0 standard drinks  . Drug use: No    ROS Constitutional: Denies fever, chills, weight loss/gain, headaches, insomnia,  night sweats, and change in appetite. Does c/o fatigue. Eyes: Denies redness, blurred vision, diplopia, discharge, itchy, watery eyes.  ENT: Denies discharge, congestion, post nasal drip, epistaxis, sore throat, earache, hearing loss, dental pain, Tinnitus, Vertigo, Sinus pain, snoring.  Cardio: Denies chest pain, palpitations, irregular heartbeat, syncope, dyspnea, diaphoresis, orthopnea, PND, claudication,  edema Respiratory: denies cough, dyspnea, DOE, pleurisy, hoarseness, laryngitis, wheezing.  Gastrointestinal: Denies dysphagia, heartburn, reflux, water brash, pain, cramps, nausea, vomiting, bloating, diarrhea, constipation, hematemesis, melena, hematochezia, jaundice, hemorrhoids Genitourinary: Denies dysuria, frequency, urgency, nocturia, hesitancy, discharge, hematuria, flank pain Breast: Breast lumps, nipple discharge, bleeding.  Musculoskeletal: Denies arthralgia, myalgia, stiffness, Jt. Swelling, pain, limp, and strain/sprain. Denies falls. Skin: Denies puritis, rash, hives, warts, acne, eczema, changing in skin lesion Neuro: No weakness, tremor, incoordination, spasms, paresthesia, pain Psychiatric: Denies confusion, memory loss, sensory loss. Denies Depression. Endocrine: Denies change in weight, skin, hair change, nocturia, and paresthesia, diabetic polys, visual blurring, hyper / hypo glycemic episodes.  Heme/Lymph: No excessive bleeding, bruising, enlarged lymph nodes.  Physical Exam  BP 136/82   Pulse 68   Temp (!) 97.4 F (36.3 C)   Resp 16   Ht 5' 3.75" (1.619 m)   Wt 197 lb 6.4 oz (89.5 kg)   BMI 34.15 kg/m   General Appearance: Over  nourished, well groomed and in no apparent distress.  Eyes: PERRLA, EOMs, conjunctiva no swelling or erythema, normal fundi and vessels. Sinuses: No frontal/maxillary tenderness ENT/Mouth: EACs patent / TMs  nl. Nares clear without erythema, swelling, mucoid exudates. Oral hygiene is good. No erythema, swelling, or exudate. Tongue normal, non-obstructing. Tonsils not swollen or erythematous. Hearing normal.  Neck: Supple, thyroid not palpable. No bruits, nodes or JVD. Respiratory: Respiratory effort normal.  BS equal and clear bilateral without rales, rhonci, wheezing or stridor. Cardio: Heart sounds are normal with regular rate and rhythm and no murmurs, rubs or gallops. Peripheral pulses are normal and equal bilaterally without edema. No  aortic or femoral bruits. Chest: symmetric with normal excursions and percussion. Breasts: Symmetric, without lumps, nipple discharge, retractions, or fibrocystic changes.  Abdomen: Flat, soft with bowel sounds active. Nontender, no guarding, rebound, hernias, masses, or organomegaly.  Lymphatics: Non tender without lymphadenopathy.  Musculoskeletal: Full ROM all peripheral extremities, joint stability, 5/5 strength, and normal gait. Skin: Warm and dry without rashes, lesions, cyanosis, clubbing or  ecchymosis.  Neuro: Cranial nerves intact, reflexes equal bilaterally. Normal muscle tone, no cerebellar symptoms. Sensation intact.  Pysch: Alert and oriented X 3, normal affect, Insight and Judgment appropriate.  Assessment and Plan  1. Annual Preventative Screening Examination  2. Essential hypertension  - EKG 12-Lead - Urinalysis, Routine w reflex microscopic - Microalbumin / creatinine urine ratio - CBC with Differential/Platelet - COMPLETE METABOLIC PANEL WITH GFR - Magnesium - TSH  3. Hyperlipidemia, mixed  - EKG 12-Lead - Lipid panel - TSH  4. Type 2 diabetes mellitus with stage 3 chronic kidney disease, without long-term current use of insulin (HCC)  - EKG 12-Lead - Urinalysis, Routine w reflex microscopic - Microalbumin / creatinine urine ratio - HM DIABETES FOOT EXAM - LOW EXTREMITY NEUR EXAM DOCUM - Hemoglobin A1c - Insulin, random  5. Vitamin D deficiency  - VITAMIN D 25 Hydroxyl  6. Morbid obesity ([place])   7. Screening for ischemic heart disease  - EKG 12-Lead  8. FHx: heart disease  - EKG 12-Lead  9. Screening for colorectal cancer  - POC Hemoccult Bld/Stl l  10. Medication management  - Urinalysis, Routine w reflex microscopic - Microalbumin / creatinine urine ratio - CBC with Differential/Platelet - COMPLETE METABOLIC PANEL WITH GFR - Magnesium - Lipid panel - TSH - Hemoglobin A1c - Insulin, random - VITAMIN D 25 Hydroxyl      Patient  was counseled in prudent diet to achieve/maintain BMI less than 25 for weight control, BP monitoring, regular exercise and medications. Discussed med's effects and SE's. Screening labs and tests as requested with regular follow-up as recommended. I discussed the assessment and treatment plan as above with the patient. The patient was provided an opportunity to ask questions and all were answered. The patient agreed with the plan and demonstrated an understanding of the instructions. Over 40 minutes of exam, counseling, chart review and high complex critical decision making was performed.   Kirtland Bouchard, MD

## 2018-05-02 NOTE — Patient Instructions (Signed)
>>>>>>>>>>>>>>>>>>>>>>>>>>>>>>>>>>>>>>>>>>>>>>>>>>>>>>> Coronavirus (COVID-19) Are you at risk?  Are you at risk for the Coronavirus (COVID-19)?  To be considered HIGH RISK for Coronavirus (COVID-19), you have to meet the following criteria:  . Traveled to [place], [place], [place], [place] or [place]; or in the [place] to [place], [place], [place]  . or [place]; and have fever, cough, and shortness of breath within the last 2 weeks of travel OR . Been in close contact with a person diagnosed with COVID-19 within the last 2 weeks and have  . fever, cough,and shortness of breath .  . IF YOU DO NOT MEET THESE CRITERIA, YOU ARE CONSIDERED LOW RISK FOR COVID-19.  What to do if you are HIGH RISK for COVID-19?  Marland Kitchen If you are having a medical emergency, call 911. . Seek medical care right away. Before you go to a doctor's office, urgent care or emergency department, .  call ahead and tell them about your recent travel, contact with someone diagnosed with COVID-19  .  and your symptoms.  . You should receive instructions from your physician's office regarding next steps of care.  . When you arrive at healthcare provider, tell the healthcare staff immediately you have returned from  . visiting [place], [place], [place], [place] or [place]; or traveled in the [place] to [place], [place],  . [place] or [place] in the last two weeks or you have been in close contact with a person diagnosed with  . COVID-19 in the last 2 weeks.   . Tell the health care staff about your symptoms: fever, cough and shortness of breath. . After you have been seen by a medical provider, you will be either: o Tested for (COVID-19) and discharged home on quarantine except to seek medical care if  o symptoms worsen, and asked to  - Stay home and avoid contact with others until you get your results (4-5 days)  - Avoid travel on public transportation if possible (such as bus, train, or airplane)  or o Sent to the Emergency Department by EMS for evaluation, COVID-19 testing  and  o possible admission depending on your condition and test results.  What to do if you are LOW RISK for COVID-19?  Reduce your risk of any infection by using the same precautions used for avoiding the common cold or flu:  Marland Kitchen Wash your hands often with soap and warm water for at least 20 seconds.  If soap and water are not readily available,  . use an alcohol-based hand sanitizer with at least 60% alcohol.  . If coughing or sneezing, cover your mouth and nose by coughing or sneezing into the elbow areas of your shirt or coat, .  into a tissue or into your sleeve (not your hands). . Avoid shaking hands with others and consider head nods or verbal greetings only. . Avoid touching your eyes, nose, or mouth with unwashed hands.  . Avoid close contact with people who are sick. . Avoid places or events with large numbers of people in one location, like concerts or sporting events. . Carefully consider travel plans you have or are making. . If you are planning any travel outside or inside the [place], visit the CDC's Travelers' Health webpage for the latest health notices. . If you have some symptoms but not all symptoms, continue to monitor at home and seek medical attention  . if your symptoms worsen. . If you are having a medical emergency, call 911.   . >>>>>>>>>>>>>>>>>>>>>>>>>>>>>>>>> .  We Do NOT Approve of  [facility], [employer]  Our Patients  To Do Home Visits & We Do NOT Approve of [employer] > > > > > > > > > > > > > > > > > > > > > > > > > > > > > > > > > > > > > > >  Preventive Care for Adults  A healthy lifestyle and preventive care can promote health and wellness. Preventive health guidelines for women include the following key practices.  A routine yearly physical is a good way to check with your health care provider about your health and preventive screening. It is a  chance to share any concerns and updates on your health and to receive a thorough exam.  Visit your dentist for a routine exam and preventive care every 6 months. Brush your teeth twice a day and floss once a day. Good oral hygiene prevents tooth decay and gum disease.  The frequency of eye exams is based on your age, health, family medical history, use of contact lenses, and other factors. Follow your health care provider's recommendations for frequency of eye exams.  Eat a healthy diet. Foods like vegetables, fruits, whole grains, low-fat dairy products, and lean protein foods contain the nutrients you need without too many calories. Decrease your intake of foods high in solid fats, added sugars, and salt. Eat the right amount of calories for you. Get information about a proper diet from your health care provider, if necessary.  Regular physical exercise is one of the most important things you can do for your health. Most adults should get at least 150 minutes of moderate-intensity exercise (any activity that increases your heart rate and causes you to sweat) each week. In addition, most adults need muscle-strengthening exercises on 2 or more days a week.  Maintain a healthy weight. The body mass index (BMI) is a screening tool to identify possible weight problems. It provides an estimate of body fat based on height and weight. Your health care provider can find your BMI and can help you achieve or maintain a healthy weight. For adults 20 years and older:  A BMI below 18.5 is considered underweight.  A BMI of 18.5 to 24.9 is normal.  A BMI of 25 to 29.9 is considered overweight.  A BMI of 30 and above is considered obese.  Maintain normal blood lipids and cholesterol levels by exercising and minimizing your intake of saturated fat. Eat a balanced diet with plenty of fruit and vegetables. If your lipid or cholesterol levels are high, you are over 50, or you are at high risk for heart disease,  you may need your cholesterol levels checked more frequently. Ongoing high lipid and cholesterol levels should be treated with medicines if diet and exercise are not working.  If you smoke, find out from your health care provider how to quit. If you do not use tobacco, do not start.  Lung cancer screening is recommended for adults aged 60-80 years who are at high risk for developing lung cancer because of a history of smoking. A yearly low-dose CT scan of the lungs is recommended for people who have at least a 30-pack-year history of smoking and are a current smoker or have quit within the past 15 years. A pack year of smoking is smoking an average of 1 pack of cigarettes a day for 1 year (for example: 1 pack a day for 30 years or 2 packs a  day for 15 years). Yearly screening should continue until the smoker has stopped smoking for at least 15 years. Yearly screening should be stopped for people who develop a health problem that would prevent them from having lung cancer treatment.  Avoid use of street drugs. Do not share needles with anyone. Ask for help if you need support or instructions about stopping the use of drugs.  High blood pressure causes heart disease and increases the risk of stroke.  Ongoing high blood pressure should be treated with medicines if weight loss and exercise do not work.  If you are 16-48 years old, ask your health care provider if you should take aspirin to prevent strokes.  Diabetes screening involves taking a blood sample to check your fasting blood sugar level. This should be done once every 3 years, after age 8, if you are within normal weight and without risk factors for diabetes. Testing should be considered at a younger age or be carried out more frequently if you are overweight and have at least 1 risk factor for diabetes.  Breast cancer screening is essential preventive care for women. You should practice "breast self-awareness." This means understanding the  normal appearance and feel of your breasts and may include breast self-examination. Any changes detected, no matter how small, should be reported to a health care provider. Women in their 28s and 30s should have a clinical breast exam (CBE) by a health care provider as part of a regular health exam every 1 to 3 years. After age 45, women should have a CBE every year. Starting at age 38, women should consider having a mammogram (breast X-ray test) every year. Women who have a family history of breast cancer should talk to their health care provider about genetic screening. Women at a high risk of breast cancer should talk to their health care providers about having an MRI and a mammogram every year.  Breast cancer gene (BRCA)-related cancer risk assessment is recommended for women who have family members with BRCA-related cancers. BRCA-related cancers include breast, ovarian, tubal, and peritoneal cancers. Having family members with these cancers may be associated with an increased risk for harmful changes (mutations) in the breast cancer genes BRCA1 and BRCA2. Results of the assessment will determine the need for genetic counseling and BRCA1 and BRCA2 testing.  Routine pelvic exams to screen for cancer are no longer recommended for nonpregnant women who are considered low risk for cancer of the pelvic organs (ovaries, uterus, and vagina) and who do not have symptoms. Ask your health care provider if a screening pelvic exam is right for you.  If you have had past treatment for cervical cancer or a condition that could lead to cancer, you need Pap tests and screening for cancer for at least 20 years after your treatment. If Pap tests have been discontinued, your risk factors (such as having a new sexual partner) need to be reassessed to determine if screening should be resumed. Some women have medical problems that increase the chance of getting cervical cancer. In these cases, your health care provider may  recommend more frequent screening and Pap tests.    Colorectal cancer can be detected and often prevented. Most routine colorectal cancer screening begins at the age of 69 years and continues through age 55 years. However, your health care provider may recommend screening at an earlier age if you have risk factors for colon cancer. On a yearly basis, your health care provider may provide home test kits to  check for hidden blood in the stool. Use of a small camera at the end of a tube, to directly examine the colon (sigmoidoscopy or colonoscopy), can detect the earliest forms of colorectal cancer. Talk to your health care provider about this at age 50, when routine screening begins.  Direct exam of the colon should be repeated every 5-10 years through age 75 years, unless early forms of pre-cancerous polyps or small growths are found.  Osteoporosis is a disease in which the bones lose minerals and strength with aging. This can result in serious bone fractures or breaks. The risk of osteoporosis can be identified using a bone density scan. Women ages 65 years and over and women at risk for fractures or osteoporosis should discuss screening with their health care providers. Ask your health care provider whether you should take a calcium supplement or vitamin D to reduce the rate of osteoporosis.  Menopause can be associated with physical symptoms and risks. Hormone replacement therapy is available to decrease symptoms and risks. You should talk to your health care provider about whether hormone replacement therapy is right for you.  Use sunscreen. Apply sunscreen liberally and repeatedly throughout the day. You should seek shade when your shadow is shorter than you. Protect yourself by wearing long sleeves, pants, a wide-brimmed hat, and sunglasses year round, whenever you are outdoors.  Once a month, do a whole body skin exam, using a mirror to look at the skin on your back. Tell your health care provider  of new moles, moles that have irregular borders, moles that are larger than a pencil eraser, or moles that have changed in shape or color.  Stay current with required vaccines (immunizations).  Influenza vaccine. All adults should be immunized every year.  Tetanus, diphtheria, and acellular pertussis (Td, Tdap) vaccine. Pregnant women should receive 1 dose of Tdap vaccine during each pregnancy. The dose should be obtained regardless of the length of time since the last dose. Immunization is preferred during the 27th-36th week of gestation. An adult who has not previously received Tdap or who does not know her vaccine status should receive 1 dose of Tdap. This initial dose should be followed by tetanus and diphtheria toxoids (Td) booster doses every 10 years. Adults with an unknown or incomplete history of completing a 3-dose immunization series with Td-containing vaccines should begin or complete a primary immunization series including a Tdap dose. Adults should receive a Td booster every 10 years.    Zoster vaccine. One dose is recommended for adults aged 60 years or older unless certain conditions are present.    Pneumococcal 13-valent conjugate (PCV13) vaccine. When indicated, a person who is uncertain of her immunization history and has no record of immunization should receive the PCV13 vaccine. An adult aged 19 years or older who has certain medical conditions and has not been previously immunized should receive 1 dose of PCV13 vaccine. This PCV13 should be followed with a dose of pneumococcal polysaccharide (PPSV23) vaccine. The PPSV23 vaccine dose should be obtained at least 1 or more year(s) after the dose of PCV13 vaccine. An adult aged 19 years or older who has certain medical conditions and previously received 1 or more doses of PPSV23 vaccine should receive 1 dose of PCV13. The PCV13 vaccine dose should be obtained 1 or more years after the last PPSV23 vaccine dose.    Pneumococcal  polysaccharide (PPSV23) vaccine. When PCV13 is also indicated, PCV13 should be obtained first. All adults aged 65 years and older   should be immunized. An adult younger than age 58 years who has certain medical conditions should be immunized. Any person who resides in a nursing home or long-term care facility should be immunized. An adult smoker should be immunized. People with an immunocompromised condition and certain other conditions should receive both PCV13 and PPSV23 vaccines. People with human immunodeficiency virus (HIV) infection should be immunized as soon as possible after diagnosis. Immunization during chemotherapy or radiation therapy should be avoided. Routine use of PPSV23 vaccine is not recommended for American Indians, [place] Natives, or people younger than 65 years unless there are medical conditions that require PPSV23 vaccine. When indicated, people who have unknown immunization and have no record of immunization should receive PPSV23 vaccine. One-time revaccination 5 years after the first dose of PPSV23 is recommended for people aged 19-64 years who have chronic kidney failure, nephrotic syndrome, asplenia, or immunocompromised conditions. People who received 1-2 doses of PPSV23 before age 82 years should receive another dose of PPSV23 vaccine at age 2 years or later if at least 5 years have passed since the previous dose. Doses of PPSV23 are not needed for people immunized with PPSV23 at or after age 9 years.   Preventive Services / Frequency  Ages 40 years and over  Blood pressure check.  Lipid and cholesterol check.  Lung cancer screening. / Every year if you are aged 49-80 years and have a 30-pack-year history of smoking and currently smoke or have quit within the past 15 years. Yearly screening is stopped once you have quit smoking for at least 15 years or develop a health problem that would prevent you from having lung cancer treatment.  Clinical breast exam.** / Every year  after age 53 years.   BRCA-related cancer risk assessment.** / For women who have family members with a BRCA-related cancer (breast, ovarian, tubal, or peritoneal cancers).  Mammogram.** / Every year beginning at age 56 years and continuing for as long as you are in good health. Consult with your health care provider.  Pap test.** / Every 3 years starting at age 73 years through age 8 or 48 years with 3 consecutive normal Pap tests. Testing can be stopped between 65 and 70 years with 3 consecutive normal Pap tests and no abnormal Pap or HPV tests in the past 10 years.  Fecal occult blood test (FOBT) of stool. / Every year beginning at age 58 years and continuing until age 53 years. You may not need to do this test if you get a colonoscopy every 10 years.  Flexible sigmoidoscopy or colonoscopy.** / Every 5 years for a flexible sigmoidoscopy or every 10 years for a colonoscopy beginning at age 35 years and continuing until age 78 years.  Hepatitis C blood test.** / For all people born from [birth year] through 1964 and any individual with known risks for hepatitis C.  Osteoporosis screening.** / A one-time screening for women ages 54 years and over and women at risk for fractures or osteoporosis.  Skin self-exam. / Monthly.  Influenza vaccine. / Every year.  Tetanus, diphtheria, and acellular pertussis (Tdap/Td) vaccine.** / 1 dose of Td every 10 years.  Zoster vaccine.** / 1 dose for adults aged 48 years or older.  Pneumococcal 13-valent conjugate (PCV13) vaccine.** / Consult your health care provider.  Pneumococcal polysaccharide (PPSV23) vaccine.** / 1 dose for all adults aged 14 years and older. Screening for abdominal aortic aneurysm (AAA)  by ultrasound is recommended for people who have history of high blood pressure  or who are current or former smokers. ++++++++++++++++++++ Recommend Adult Low Dose Aspirin or  coated  Aspirin 81 mg daily  To reduce risk of Colon Cancer 20 %,  Skin  Cancer 26 % ,  Melanoma 46%  and  Pancreatic cancer 60% ++++++++++++++++++++ Vitamin D goal  is between 70-100.  Please make sure that you are taking your Vitamin D as directed.  It is very important as a natural anti-inflammatory  helping hair, skin, and nails, as well as reducing stroke and heart attack risk.  It helps your bones and helps with mood. It also decreases numerous cancer risks so please take it as directed.  Low Vit D is associated with a 200-300% higher risk for CANCER  and 200-300% higher risk for HEART   ATTACK  &  STROKE.   .....................................Marland Kitchen It is also associated with higher death rate at younger ages,  autoimmune diseases like Rheumatoid arthritis, Lupus, Multiple Sclerosis.    Also many other serious conditions, like depression, Alzheimer's Dementia, infertility, muscle aches, fatigue, fibromyalgia - just to name a few. ++++++++++++++++++ Recommend the book "The END of DIETING" by Dr Excell Seltzer  & the book "The END of DIABETES " by Dr Excell Seltzer At [email or web address] - get book & Audio CD's    Being diabetic has a  300% increased risk for heart attack, stroke, cancer, and alzheimer- type vascular dementia. It is very important that you work harder with diet by avoiding all foods that are white. Avoid white rice (brown & wild rice is OK), white potatoes (sweetpotatoes in moderation is OK), White bread or wheat bread or anything made out of white flour like bagels, donuts, rolls, buns, biscuits, cakes, pastries, cookies, pizza crust, and pasta (made from white flour & egg whites) - vegetarian pasta or spinach or wheat pasta is OK. Multigrain breads like Arnold's or Pepperidge [employer], or multigrain sandwich thins or flatbreads.  Diet, exercise and weight loss can reverse and cure diabetes in the early stages.  Diet, exercise and weight loss is very important in the control and prevention of complications of diabetes which affects every system in your body, ie.  Brain - dementia/stroke, eyes - glaucoma/blindness, heart - heart attack/heart failure, kidneys - dialysis, stomach - gastric paralysis, intestines - malabsorption, nerves - severe painful neuritis, circulation - gangrene & loss of a leg(s), and finally cancer and Alzheimers.    I recommend avoid fried & greasy foods,  sweets/candy, white rice (brown or wild rice or Quinoa is OK), white potatoes (sweet potatoes are OK) - anything made from white flour - bagels, doughnuts, rolls, buns, biscuits,white and wheat breads, pizza crust and traditional pasta made of white flour & egg white(vegetarian pasta or spinach or wheat pasta is OK).  Multi-grain bread is OK - like multi-grain flat bread or sandwich thins. Avoid alcohol in excess. Exercise is also important.    Eat all the vegetables you want - avoid meat, especially red meat and dairy - especially cheese.  Cheese is the most concentrated form of trans-fats which is the worst thing to clog up our arteries. Veggie cheese is OK which can be found in the fresh produce section at [employer] or [employer] or [employer]  +++++++++++++++++++ DASH Eating Plan  DASH stands for "Dietary Approaches to Stop Hypertension."   The DASH eating plan is a healthy eating plan that has been shown to reduce high blood pressure (hypertension). Additional health benefits may include reducing the risk of type 2 diabetes mellitus, heart  disease, and stroke. The DASH eating plan may also help with weight loss. WHAT DO I NEED TO KNOW ABOUT THE DASH EATING PLAN? For the DASH eating plan, you will follow these general guidelines:  Choose foods with a percent daily value for sodium of less than 5% (as listed on the food label).  Use salt-free seasonings or herbs instead of table salt or sea salt.  Check with your health care provider or pharmacist before using salt substitutes.  Eat lower-sodium products, often labeled as "lower sodium" or "no salt added."  Eat fresh  foods.  Eat more vegetables, fruits, and low-fat dairy products.  Choose whole grains. Look for the word "whole" as the first word in the ingredient list.  Choose fish   Limit sweets, desserts, sugars, and sugary drinks.  Choose heart-healthy fats.  Eat veggie cheese   Eat more home-cooked food and less restaurant, buffet, and fast food.  Limit fried foods.  Cook foods using methods other than frying.  Limit canned vegetables. If you do use them, rinse them well to decrease the sodium.  When eating at a restaurant, ask that your food be prepared with less salt, or no salt if possible.                      WHAT FOODS CAN I EAT? Read Dr Fara Olden Fuhrman's books on The End of Dieting & The End of Diabetes  Grains Whole grain or whole wheat bread. Brown rice. Whole grain or whole wheat pasta. Quinoa, bulgur, and whole grain cereals. Low-sodium cereals. Corn or whole wheat flour tortillas. Whole grain cornbread. Whole grain crackers. Low-sodium crackers.  Vegetables Fresh or frozen vegetables (raw, steamed, roasted, or grilled). Low-sodium or reduced-sodium tomato and vegetable juices. Low-sodium or reduced-sodium tomato sauce and paste. Low-sodium or reduced-sodium canned vegetables.   Fruits All fresh, canned (in natural juice), or frozen fruits.  Protein Products  All fish and seafood.  Dried beans, peas, or lentils. Unsalted nuts and seeds. Unsalted canned beans.  Dairy Low-fat dairy products, such as skim or 1% milk, 2% or reduced-fat cheeses, low-fat ricotta or cottage cheese, or plain low-fat yogurt. Low-sodium or reduced-sodium cheeses.  Fats and Oils Tub margarines without trans fats. Light or reduced-fat mayonnaise and salad dressings (reduced sodium). Avocado. Safflower, olive, or canola oils. Natural peanut or almond butter.  Other Unsalted popcorn and pretzels. The items listed above may not be a complete list of recommended foods or beverages. Contact your  dietitian for more options.  +++++++++++++++  WHAT FOODS ARE NOT RECOMMENDED? Grains/ White flour or wheat flour White bread. White pasta. White rice. Refined cornbread. Bagels and croissants. Crackers that contain trans fat.  Vegetables  Creamed or fried vegetables. Vegetables in a . Regular canned vegetables. Regular canned tomato sauce and paste. Regular tomato and vegetable juices.  Fruits Dried fruits. Canned fruit in light or heavy syrup. Fruit juice.  Meat and Other Protein Products Meat in general - RED meat & White meat.  Fatty cuts of meat. Ribs, chicken wings, all processed meats as bacon, sausage, bologna, salami, fatback, hot dogs, bratwurst and packaged luncheon meats.  Dairy Whole or 2% milk, cream, half-and-half, and cream cheese. Whole-fat or sweetened yogurt. Full-fat cheeses or blue cheese. Non-dairy creamers and whipped toppings. Processed cheese, cheese spreads, or cheese curds.  Condiments Onion and garlic salt, seasoned salt, table salt, and sea salt. Canned and packaged gravies. Worcestershire sauce. Tartar sauce. Barbecue sauce. Teriyaki sauce. Soy sauce, including  reduced sodium. Steak sauce. Fish sauce. Oyster sauce. Cocktail sauce. Horseradish. Ketchup and mustard. Meat flavorings and tenderizers. Bouillon cubes. Hot sauce. Tabasco sauce. Marinades. Taco seasonings. Relishes.  Fats and Oils Butter, stick margarine, lard, shortening and bacon fat. Coconut, palm kernel, or palm oils. Regular salad dressings.  Pickles and olives. Salted popcorn and pretzels.  The items listed above may not be a complete list of foods and beverages to avoid.

## 2018-05-03 ENCOUNTER — Ambulatory Visit (INDEPENDENT_AMBULATORY_CARE_PROVIDER_SITE_OTHER): Payer: Medicare Other | Admitting: Internal Medicine

## 2018-05-03 ENCOUNTER — Encounter: Payer: Self-pay | Admitting: Internal Medicine

## 2018-05-03 ENCOUNTER — Other Ambulatory Visit: Payer: Self-pay

## 2018-05-03 VITALS — BP 136/82 | HR 68 | Temp 97.4°F | Resp 16 | Ht 63.75 in | Wt 197.4 lb

## 2018-05-03 DIAGNOSIS — Z79899 Other long term (current) drug therapy: Secondary | ICD-10-CM

## 2018-05-03 DIAGNOSIS — I10 Essential (primary) hypertension: Secondary | ICD-10-CM

## 2018-05-03 DIAGNOSIS — N183 Chronic kidney disease, stage 3 unspecified: Secondary | ICD-10-CM

## 2018-05-03 DIAGNOSIS — Z0000 Encounter for general adult medical examination without abnormal findings: Secondary | ICD-10-CM

## 2018-05-03 DIAGNOSIS — E559 Vitamin D deficiency, unspecified: Secondary | ICD-10-CM

## 2018-05-03 DIAGNOSIS — Z1212 Encounter for screening for malignant neoplasm of rectum: Secondary | ICD-10-CM

## 2018-05-03 DIAGNOSIS — E1122 Type 2 diabetes mellitus with diabetic chronic kidney disease: Secondary | ICD-10-CM

## 2018-05-03 DIAGNOSIS — F5101 Primary insomnia: Secondary | ICD-10-CM

## 2018-05-03 DIAGNOSIS — Z1211 Encounter for screening for malignant neoplasm of colon: Secondary | ICD-10-CM

## 2018-05-03 DIAGNOSIS — Z136 Encounter for screening for cardiovascular disorders: Secondary | ICD-10-CM | POA: Diagnosis not present

## 2018-05-03 DIAGNOSIS — Z8249 Family history of ischemic heart disease and other diseases of the circulatory system: Secondary | ICD-10-CM

## 2018-05-03 DIAGNOSIS — E782 Mixed hyperlipidemia: Secondary | ICD-10-CM

## 2018-05-03 DIAGNOSIS — Z0001 Encounter for general adult medical examination with abnormal findings: Secondary | ICD-10-CM

## 2018-05-03 MED ORDER — TRAZODONE HCL 150 MG PO TABS: ORAL_TABLET | ORAL | 0 refills | Status: DC

## 2018-05-04 LAB — LIPID PANEL
Cholesterol: 132 mg/dL (ref <200)
HDL: 50 mg/dL (ref >=50)
LDL Cholesterol (Calc): 56 mg/dL (calc)
Non-HDL Cholesterol (Calc): 82 mg/dL (calc) (ref <130)
Total CHOL/HDL Ratio: 2.6 (calc) (ref <5.0)
Triglycerides: 193 mg/dL — ABNORMAL HIGH (ref <150)

## 2018-05-04 LAB — COMPLETE METABOLIC PANEL WITH GFR
AG Ratio: 1.8 (calc) (ref 1.0–2.5)
ALT: 14 U/L (ref 6–29)
AST: 13 U/L (ref 10–35)
Albumin: 4.2 g/dL (ref 3.6–5.1)
Alkaline phosphatase (APISO): 72 U/L (ref 37–153)
BUN: 21 mg/dL (ref 7–25)
CO2: 25 mmol/L (ref 20–32)
Calcium: 9.7 mg/dL (ref 8.6–10.4)
Chloride: 103 mmol/L (ref 98–110)
Creat: 0.82 mg/dL (ref 0.50–0.99)
GFR, Est African American: 86 mL/min/{1.73_m2} (ref >=60)
GFR, Est Non African American: 75 mL/min/{1.73_m2} (ref >=60)
Globulin: 2.4 g/dL (calc) (ref 1.9–3.7)
Glucose, Bld: 104 mg/dL — ABNORMAL HIGH (ref 65–99)
Potassium: 4.1 mmol/L (ref 3.5–5.3)
Sodium: 137 mmol/L (ref 135–146)
Total Bilirubin: 0.5 mg/dL (ref 0.2–1.2)
Total Protein: 6.6 g/dL (ref 6.1–8.1)

## 2018-05-04 LAB — URINALYSIS, ROUTINE W REFLEX MICROSCOPIC
Bacteria, UA: NONE SEEN /HPF
Bilirubin Urine: NEGATIVE
Glucose, UA: NEGATIVE
Hgb urine dipstick: NEGATIVE
Hyaline Cast: NONE SEEN /LPF
Ketones, ur: NEGATIVE
Nitrite: NEGATIVE
Protein, ur: NEGATIVE
RBC / HPF: NONE SEEN /HPF (ref 0–2)
Specific Gravity, Urine: 1.01 (ref 1.001–1.03)
Squamous Epithelial / HPF: NONE SEEN /HPF (ref <=5)
WBC, UA: NONE SEEN /HPF (ref 0–5)
pH: 6.5 (ref 5.0–8.0)

## 2018-05-04 LAB — HEMOGLOBIN A1C
Hgb A1c MFr Bld: 6 % of total Hgb — ABNORMAL HIGH (ref <5.7)
Mean Plasma Glucose: 126 (calc)
eAG (mmol/L): 7 (calc)

## 2018-05-04 LAB — MICROALBUMIN / CREATININE URINE RATIO
Creatinine, Urine: 44 mg/dL (ref 20–275)
Microalb Creat Ratio: 9 mcg/mg creat (ref <30)
Microalb, Ur: 0.4 mg/dL

## 2018-05-04 LAB — CBC WITH DIFFERENTIAL/PLATELET
Absolute Monocytes: 531 cells/uL (ref 200–950)
Basophils Absolute: 48 cells/uL (ref 0–200)
Basophils Relative: 0.7 %
Eosinophils Absolute: 62 cells/uL (ref 15–500)
Eosinophils Relative: 0.9 %
HCT: 42.2 % (ref 35.0–45.0)
Hemoglobin: 14.3 g/dL (ref 11.7–15.5)
Lymphs Abs: 1732 cells/uL (ref 850–3900)
MCH: 30.7 pg (ref 27.0–33.0)
MCHC: 33.9 g/dL (ref 32.0–36.0)
MCV: 90.6 fL (ref 80.0–100.0)
MPV: 9.7 fL (ref 7.5–12.5)
Monocytes Relative: 7.7 %
Neutro Abs: 4526 cells/uL (ref 1500–7800)
Neutrophils Relative %: 65.6 %
Platelets: 391 10*3/uL (ref 140–400)
RBC: 4.66 10*6/uL (ref 3.80–5.10)
RDW: 12.4 % (ref 11.0–15.0)
Total Lymphocyte: 25.1 %
WBC: 6.9 10*3/uL (ref 3.8–10.8)

## 2018-05-04 LAB — MAGNESIUM: Magnesium: 2.1 mg/dL (ref 1.5–2.5)

## 2018-05-04 LAB — TSH: TSH: 2.3 mIU/L (ref 0.40–4.50)

## 2018-05-04 LAB — INSULIN, RANDOM: Insulin: 14.3 u[IU]/mL

## 2018-05-04 LAB — VITAMIN D 25 HYDROXY (VIT D DEFICIENCY, FRACTURES): Vit D, 25-Hydroxy: 102 ng/mL — ABNORMAL HIGH (ref 30–100)

## 2018-05-11 DIAGNOSIS — N201 Calculus of ureter: Secondary | ICD-10-CM | POA: Diagnosis not present

## 2018-05-16 ENCOUNTER — Other Ambulatory Visit: Payer: Self-pay

## 2018-05-16 DIAGNOSIS — Z1212 Encounter for screening for malignant neoplasm of rectum: Principal | ICD-10-CM

## 2018-05-16 DIAGNOSIS — Z1211 Encounter for screening for malignant neoplasm of colon: Secondary | ICD-10-CM

## 2018-05-16 LAB — POC HEMOCCULT BLD/STL (HOME/3-CARD/SCREEN)
Card #2 Fecal Occult Blod, POC: NEGATIVE
Card #3 Fecal Occult Blood, POC: NEGATIVE
Fecal Occult Blood, POC: NEGATIVE

## 2018-05-17 DIAGNOSIS — Z1211 Encounter for screening for malignant neoplasm of colon: Secondary | ICD-10-CM | POA: Diagnosis not present

## 2018-06-01 ENCOUNTER — Other Ambulatory Visit: Payer: Self-pay | Admitting: Internal Medicine

## 2018-06-01 DIAGNOSIS — F5101 Primary insomnia: Secondary | ICD-10-CM

## 2018-06-01 MED ORDER — TRAZODONE HCL 150 MG PO TABS: ORAL_TABLET | ORAL | 3 refills | Status: DC

## 2018-06-20 DIAGNOSIS — H2513 Age-related nuclear cataract, bilateral: Secondary | ICD-10-CM | POA: Diagnosis not present

## 2018-06-29 ENCOUNTER — Encounter: Payer: Self-pay | Admitting: Adult Health

## 2018-06-29 ENCOUNTER — Ambulatory Visit (INDEPENDENT_AMBULATORY_CARE_PROVIDER_SITE_OTHER): Payer: Medicare Other | Admitting: Adult Health

## 2018-06-29 ENCOUNTER — Other Ambulatory Visit: Payer: Self-pay

## 2018-06-29 VITALS — BP 164/88 | HR 74 | Temp 97.3°F | Ht 63.75 in | Wt 188.6 lb

## 2018-06-29 DIAGNOSIS — R82998 Other abnormal findings in urine: Secondary | ICD-10-CM

## 2018-06-29 DIAGNOSIS — R1013 Epigastric pain: Secondary | ICD-10-CM | POA: Diagnosis not present

## 2018-06-29 MED ORDER — PROMETHAZINE HCL 25 MG PO TABS: ORAL_TABLET | ORAL | 3 refills | Status: DC

## 2018-06-29 MED ORDER — OMEPRAZOLE 20 MG PO CPDR: 20.0000 mg | DELAYED_RELEASE_CAPSULE | Freq: Two times a day (BID) | ORAL | 0 refills | Status: DC

## 2018-06-29 NOTE — Progress Notes (Signed)
Assessment and Plan:  Diagnoses and all orders for this visit:  Epigastric pain Low grade fever, worse with food intake, doubt cardiac etiology + epigastric tenderness Check labs, possible pancreatitis, ulcer, r/o H. Pylori, recurrent hiatal hernia Please go to the ER if you have any severe AB pain, unable to hold down food/water, blood in stool or vomit, chest pain, shortness of breath, or any worsening symptoms.  Consider imaging pending labs Push fluids, bland diet recommended,  Phenergan PRN nausea, start PPI after h. Pylori test completed Call with any new symptoms -     CBC with Differential/Platelet -     COMPLETE METABOLIC PANEL WITH GFR -     Amylase -     Lipase -     H. pylori breath test  Dark urine -     Urinalysis w microscopic + reflex cultur  Other orders -     promethazine (PHENERGAN) 25 MG tablet; Take 1/2-1 tab every 6 hours as needed for nausea. -     omeprazole (PRILOSEC) 20 MG capsule; Take 1 capsule (20 mg total) by mouth 2 (two) times daily before a meal for 14 days. 14 days  Further disposition pending results of labs. Discussed med's effects and SE's.   Over 30 minutes of exam, counseling, chart review, and critical decision making was performed.   Future Appointments  Date Time Provider [place]  08/03/2018  2:30 PM Liane Comber, NP [facility] None  11/07/2018  2:30 PM Unk Pinto, MD [facility] None  01/19/2019  3:00 PM Vicie Mutters, PA-C [facility] None  01/26/2019  9:00 AM [facility] 1 [facility] None  01/26/2019  9:30 AM Magrinat, Virgie Dad, MD [facility] None  05/08/2019  2:00 PM Unk Pinto, MD [facility] None    ------------------------------------------------------------------------------------------------------------------   HPI BP (!) 164/88   Pulse 74   Temp (!) 97.3 F (36.3 C)   Ht 5' 3.75" (1.619 m)   Wt 188 lb 9.6 oz (85.5 kg)   SpO2 96%   BMI 32.63 kg/m   68 y.o.female with hx of htn, T2DM, GERD,  presents for evaluation of low grade fever (99.0-100.3), RUQ dull pain/epigastric, poor appetite, nausea and fatigue ongoing for 5 days. She reports gradual onset Saturday morning, began with epigastric discomfort, has sense that area is "swollen" and "hard" - reports didn't have an appetite the next day, has been feeling unwell with poor appetite and persistent nausea.   She describes epigastric discomfort, "achy", 5/10, worse with deep inspiration, is intermittent, intermittently radiates through her back. Denies extremity pain, jaw pain, exertion. Denies dyspnea, LE edema. She does have mild fatigue but attributes this to not eating. She is able to hold down water, but due to nausea feels hasn't been drinking much. Pain worse with eating. Worse when she rolls to her either side, improved with supine position.   She reports hasn't had a BM since Monday, unsual for her (but hasn't been eating), denies vomiting, diarrhea, previous changes in stool, red or dark stools.   Had episode of ? Blood in urine this AM and yesterday, dark orange color, denies dysuria, urgency, frequency.   Just had renal stone on the R in feb 2020, was admitted and had cysto with stent by Dr. Diona Fanti.   She reports history of GERD but resolved after lap band, does endorse mild night tight reflux. Doesn't drink alcohol, denies recent NSAID use.   Hx of numerous abdominal surgeries including cholecystectomy, appendectomy, C section, exploratory lab, cysto with stents  Hx of hiatal hernia corrected surgically, s/p lab band procedure   Past Medical History:  Diagnosis Date  . Allergy    SEASONAL  . Anxiety   . At risk for difficult insertion of breathing tube   . Breast cancer ([place])   . Breast cancer of upper-outer quadrant of right female breast ([place]) 12/03/2014  . 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 GERD (gastroesophageal reflux disease)    hx - resolved with lap band surgery  . H/O hiatal hernia     resolved with band surgery  . H/O laparoscopic adjustable gastric banding 2010  . Hyperlipidemia   . Hypertension   . Insomnia    tx with zanax  . Personal history of radiation therapy   . 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 Type II or unspecified type diabetes mellitus without mention of complication, not stated as uncontrolled 12/14/2012   no meds, diet controlled     Allergies  Allergen Reactions  . Codeine Itching  . Gabapentin Other (See Comments)  . Lipitor [Atorvastatin] Other (See Comments)    Severe fatigue. ? memory loss.  . Ace Inhibitors     cough    Current Outpatient Medications on File Prior to Visit  Medication Sig  . anastrozole (ARIMIDEX) 1 MG tablet TAKE 1 TABLET BY MOUTH EVERY DAY  . atenolol (TENORMIN) 100 MG tablet TAKE 1 TABLET BY MOUTH EVERY DAY (Patient taking differently: Take 1/2 tablet daily)  . rosuvastatin (CRESTOR) 40 MG tablet Take 1 tablet daily for Cholesterol  . traZODone (DESYREL) 150 MG tablet Take 1/2 to 1 tablet 1 to 2 hours before Bedtime  . verapamil (CALAN) 80 MG tablet Take 1 tablet 2 x /day with meal for BP  . acidophilus (RISAQUAD) CAPS Take 1 capsule by mouth daily. Reported on 02/19/2015  . aspirin 81 MG tablet Take 81 mg by mouth daily.  Marland Kitchen b complex vitamins capsule Take 1 capsule by mouth daily.  . calcium carbonate (TUMS) 500 MG chewable tablet Chew 1,000 mg by mouth at bedtime.   . Cholecalciferol (VITAMIN D3) 5000 UNITS CAPS Take 5,000 Units by mouth 2 (two) times daily.   . Cinnamon 500 MG TABS Take 1 tablet by mouth daily. Cinnamon 1000 mg, 1 tab in morning and 1 tab evening   . diclofenac sodium (VOLTAREN) 1 % GEL Apply to affected area 4 x /day as needed (Patient not taking: Reported on 06/29/2018)  . HYDROcodone-acetaminophen (NORCO/VICODIN) 5-325 MG tablet Take 1 tablet by mouth every 6 (six) hours as needed. (Patient not taking: Reported on 05/03/2018)  . Magnesium 250 MG TABS Take 250 mg by mouth 2 (two) times  daily.  . Multiple Vitamins-Minerals (MULTIVITAMIN WITH MINERALS) tablet Take 1 tablet by mouth 2 (two) times daily.  . Omega-3 Fatty Acids (FISH OIL) 1000 MG CAPS Take 1,000 mg by mouth daily.  . ondansetron (ZOFRAN) 4 MG tablet Take 1 tablet (4 mg total) by mouth every 8 (eight) hours as needed for nausea or vomiting. (Patient not taking: Reported on 06/29/2018)  . OVER THE COUNTER MEDICATION Take 1 tablet by mouth 2 (two) times daily. Tumeric with black pepper   No current facility-administered medications on file prior to visit.     ROS: Review of Systems  Constitutional: Positive for fever and malaise/fatigue. Negative for chills and weight loss.  HENT: Negative.   Eyes: Negative.   Respiratory: Negative for cough, sputum production, shortness of breath and wheezing.   Cardiovascular:  Negative for chest pain, palpitations, orthopnea, claudication, leg swelling and PND.  Gastrointestinal: Positive for abdominal pain (epigastric, RUQ), constipation (no BM since Monday), heartburn (mild intermittent ongoing) and nausea. Negative for blood in stool, diarrhea, melena and vomiting.  Genitourinary: Positive for hematuria (? hematuria vs concentrated urine "dark color" ). Negative for dysuria, flank pain, frequency and urgency.  Musculoskeletal: Negative for back pain, falls, joint pain and myalgias.  Skin: Negative for rash.  Neurological: Negative for dizziness, weakness and headaches.  Endo/Heme/Allergies: Negative for polydipsia. Does not bruise/bleed easily.  Psychiatric/Behavioral: Negative for substance abuse.     Physical Exam:  BP (!) 164/88   Pulse 74   Temp (!) 97.3 F (36.3 C)   Ht 5' 3.75" (1.619 m)   Wt 188 lb 9.6 oz (85.5 kg)   SpO2 96%   BMI 32.63 kg/m   General Appearance: Well nourished, in no apparent distress. Eyes: PERRLA, conjunctiva no swelling or erythema Sinuses: No Frontal/maxillary tenderness ENT/Mouth: Ext aud canals clear, TMs without erythema,  bulging. No erythema, swelling, or exudate on post pharynx.  Tonsils not swollen or erythematous. Hearing normal.  Neck: Supple, thyroid normal.  Respiratory: Respiratory effort normal, BS equal bilaterally without rales, rhonchi, wheezing or stridor.  Cardio: RRR with 2/6 non-radiating systolic murmur. Brisk peripheral pulses without edema.  Abdomen: Soft, + BS.  She has notable epigastric tenderness with some guarding, mild RUQ tenderness, no rebound, palpable hernias, masses. Lymphatics: Non tender without lymphadenopathy.  Musculoskeletal: Full ROM, 5/5 strength, normal gait.  Skin: Warm, dry without rashes, lesions, ecchymosis.  Neuro: Cranial nerves intact. Normal muscle tone, no cerebellar symptoms. Sensation intact.  Psych: Awake and oriented X 3, normal affect, Insight and Judgment appropriate.     Izora Ribas, NP 11:18 AM [facility]

## 2018-06-29 NOTE — Patient Instructions (Addendum)
Please continue with liquids only/bland diet, alternate water and gatorade if not drinking, monitor glucose closely at home  I am checking for pancreatitis, H. Pylori stomach ulcer  Please go to the ER if you have any severe AB pain, unable to hold down food/water, blood in stool or vomit, chest pain, shortness of breath, or any worsening symptoms.   Start omeprazole 20 mg twice daily   Phenergan as needed every 6 hours for nausea/vomiting   Abdominal Pain, Adult Abdominal pain can be caused by many things. Often, abdominal pain is not serious and it gets better with no treatment or by being treated at home. However, sometimes abdominal pain is serious. Your health care provider will do a medical history and a physical exam to try to determine the cause of your abdominal pain. Follow these instructions at home:  Take over-the-counter and prescription medicines only as told by your health care provider. Do not take a laxative unless told by your health care provider.  Drink enough fluid to keep your urine clear or pale yellow.  Watch your condition for any changes.  Keep all follow-up visits as told by your health care provider. This is important. Contact a health care provider if:  Your abdominal pain changes or gets worse.  You are not hungry or you lose weight without trying.  You are constipated or have diarrhea for more than 2-3 days.  You have pain when you urinate or have a bowel movement.  Your abdominal pain wakes you up at night.  Your pain gets worse with meals, after eating, or with certain foods.  You are throwing up and cannot keep anything down.  You have a fever. Get help right away if:  Your pain does not go away as soon as your health care provider told you to expect.  You cannot stop throwing up.  Your pain is only in areas of the abdomen, such as the right side or the left lower portion of the abdomen.  You have bloody or black stools, or stools that  look like tar.  You have severe pain, cramping, or bloating in your abdomen.  You have signs of dehydration, such as: ? Dark urine, very little urine, or no urine. ? Cracked lips. ? Dry mouth. ? Sunken eyes. ? Sleepiness. ? Weakness. This information is not intended to replace advice given to you by your health care provider. Make sure you discuss any questions you have with your health care provider. Document Released: 10/08/2004 Document Revised: 07/19/2015 Document Reviewed: 06/12/2015 Elsevier Interactive Patient Education  2019 [place].     Promethazine tablets What is this medicine? PROMETHAZINE (proe METH a zeen) is an antihistamine. It is used to treat allergic reactions and to treat or prevent nausea and vomiting from illness or motion sickness. It is also used to make you sleep before surgery, and to help treat pain or nausea after surgery. This medicine may be used for other purposes; ask your health care provider or pharmacist if you have questions. COMMON BRAND NAME(S): Phenergan What should I tell my health care provider before I take this medicine? They need to know if you have any of these conditions: -glaucoma -high blood pressure or heart disease -kidney disease -liver disease -lung or breathing disease, like asthma -prostate trouble -pain or difficulty passing urine -seizures -an unusual or allergic reaction to promethazine or phenothiazines, other medicines, foods, dyes, or preservatives -pregnant or trying to get pregnant -breast-feeding How should I use this medicine?  Take this medicine by mouth with a glass of water. Follow the directions on the prescription label. Take your doses at regular intervals. Do not take your medicine more often than directed. Talk to your pediatrician regarding the use of this medicine in children. Special care may be needed. This medicine should not be given to infants and children younger than 79 years old. Overdosage:  If you think you have taken too much of this medicine contact a poison control center or emergency room at once. NOTE: This medicine is only for you. Do not share this medicine with others. What if I miss a dose? If you miss a dose, take it as soon as you can. If it is almost time for your next dose, take only that dose. Do not take double or extra doses. What may interact with this medicine? Do not take this medicine with any of the following medications: -cisapride -dofetilide -dronedarone -MAOIs like Carbex, Eldepryl, Marplan, Nardil, Parnate -pimozide -quinidine, including dextromethorphan; quinidine -thioridazine -ziprasidone This medicine may also interact with the following medications: -certain medicines for depression, anxiety, or psychotic disturbances -certain medicines for anxiety or sleep -certain medicines for seizures like carbamazepine, phenobarbital, phenytoin -certain medicines for movement abnormalities as in Parkinson's disease, or for gastrointestinal problems -epinephrine -medicines for allergies or colds -muscle relaxants -narcotic medicines for pain -other medicines that prolong the QT interval (cause an abnormal heart rhythm) -tramadol -trimethobenzamide This list may not describe all possible interactions. Give your health care provider a list of all the medicines, herbs, non-prescription drugs, or dietary supplements you use. Also tell them if you smoke, drink alcohol, or use illegal drugs. Some items may interact with your medicine. What should I watch for while using this medicine? Tell your doctor or health care professional if your symptoms do not start to get better in 1 to 2 days. You may get drowsy or dizzy. Do not drive, use machinery, or do anything that needs mental alertness until you know how this medicine affects you. To reduce the risk of dizzy or fainting spells, do not stand or sit up quickly, especially if you are an older patient. Alcohol may  increase dizziness and drowsiness. Avoid alcoholic drinks. Your mouth may get dry. Chewing sugarless gum or sucking hard candy, and drinking plenty of water may help. Contact your doctor if the problem does not go away or is severe. This medicine may cause dry eyes and blurred vision. If you wear contact lenses you may feel some discomfort. Lubricating drops may help. See your eye doctor if the problem does not go away or is severe. This medicine can make you more sensitive to the sun. Keep out of the sun. If you cannot avoid being in the sun, wear protective clothing and use sunscreen. Do not use sun lamps or tanning beds/booths. If you are diabetic, check your blood-sugar levels regularly. What side effects may I notice from receiving this medicine? Side effects that you should report to your doctor or health care professional as soon as possible: -blurred vision -irregular heartbeat, palpitations or chest pain -muscle or facial twitches -pain or difficulty passing urine -seizures -skin rash -slowed or shallow breathing -unusual bleeding or bruising -yellowing of the eyes or skin Side effects that usually do not require medical attention (report to your doctor or health care professional if they continue or are bothersome): -headache -nightmares, agitation, nervousness, excitability, not able to sleep (these are more likely in children) -stuffy nose This list may not describe all  possible side effects. Call your doctor for medical advice about side effects. You may report side effects to FDA at 1-800-FDA-1088. Where should I keep my medicine? Keep out of the reach of children. Store at room temperature, between 20 and 25 degrees C (68 and 77 degrees F). Protect from light. Throw away any unused medicine after the expiration date. NOTE: This sheet is a summary. It may not cover all possible information. If you have questions about this medicine, talk to your doctor, pharmacist, or health  care provider.  2019 Elsevier/Gold Standard (2012-08-30 15:04:46)

## 2018-06-30 ENCOUNTER — Inpatient Hospital Stay (HOSPITAL_COMMUNITY)
Admission: EM | Admit: 2018-06-30 | Discharge: 2018-07-02 | DRG: 435 | Disposition: A | Discharge status: Home / Self Care | Payer: Medicare Other | Attending: Internal Medicine | Admitting: Internal Medicine

## 2018-06-30 ENCOUNTER — Other Ambulatory Visit: Payer: Self-pay | Admitting: Adult Health

## 2018-06-30 ENCOUNTER — Ambulatory Visit
Admission: RE | Admit: 2018-06-30 | Discharge: 2018-06-30 | Disposition: A | Discharge status: Home / Self Care | Payer: Medicare Other | Source: Ambulatory Visit | Attending: Adult Health | Admitting: Adult Health

## 2018-06-30 ENCOUNTER — Telehealth: Payer: Self-pay | Admitting: Gastroenterology

## 2018-06-30 ENCOUNTER — Encounter (HOSPITAL_COMMUNITY): Payer: Self-pay

## 2018-06-30 ENCOUNTER — Other Ambulatory Visit: Payer: Self-pay

## 2018-06-30 DIAGNOSIS — E1122 Type 2 diabetes mellitus with diabetic chronic kidney disease: Secondary | ICD-10-CM | POA: Diagnosis not present

## 2018-06-30 DIAGNOSIS — E559 Vitamin D deficiency, unspecified: Secondary | ICD-10-CM | POA: Diagnosis present

## 2018-06-30 DIAGNOSIS — R1013 Epigastric pain: Secondary | ICD-10-CM

## 2018-06-30 DIAGNOSIS — Z923 Personal history of irradiation: Secondary | ICD-10-CM | POA: Diagnosis not present

## 2018-06-30 DIAGNOSIS — N182 Chronic kidney disease, stage 2 (mild): Secondary | ICD-10-CM | POA: Diagnosis not present

## 2018-06-30 DIAGNOSIS — R7989 Other specified abnormal findings of blood chemistry: Secondary | ICD-10-CM

## 2018-06-30 DIAGNOSIS — K838 Other specified diseases of biliary tract: Secondary | ICD-10-CM

## 2018-06-30 DIAGNOSIS — Z20828 Contact with and (suspected) exposure to other viral communicable diseases: Secondary | ICD-10-CM | POA: Diagnosis present

## 2018-06-30 DIAGNOSIS — Z803 Family history of malignant neoplasm of breast: Secondary | ICD-10-CM

## 2018-06-30 DIAGNOSIS — Z79899 Other long term (current) drug therapy: Secondary | ICD-10-CM | POA: Diagnosis not present

## 2018-06-30 DIAGNOSIS — C250 Malignant neoplasm of head of pancreas: Principal | ICD-10-CM | POA: Diagnosis present

## 2018-06-30 DIAGNOSIS — I10 Essential (primary) hypertension: Secondary | ICD-10-CM | POA: Diagnosis present

## 2018-06-30 DIAGNOSIS — Z9049 Acquired absence of other specified parts of digestive tract: Secondary | ICD-10-CM | POA: Diagnosis not present

## 2018-06-30 DIAGNOSIS — E782 Mixed hyperlipidemia: Secondary | ICD-10-CM | POA: Diagnosis not present

## 2018-06-30 DIAGNOSIS — I129 Hypertensive chronic kidney disease with stage 1 through stage 4 chronic kidney disease, or unspecified chronic kidney disease: Secondary | ICD-10-CM | POA: Diagnosis present

## 2018-06-30 DIAGNOSIS — Z7982 Long term (current) use of aspirin: Secondary | ICD-10-CM

## 2018-06-30 DIAGNOSIS — R748 Abnormal levels of other serum enzymes: Secondary | ICD-10-CM

## 2018-06-30 DIAGNOSIS — Z87891 Personal history of nicotine dependence: Secondary | ICD-10-CM

## 2018-06-30 DIAGNOSIS — R945 Abnormal results of liver function studies: Secondary | ICD-10-CM

## 2018-06-30 DIAGNOSIS — K219 Gastro-esophageal reflux disease without esophagitis: Secondary | ICD-10-CM | POA: Diagnosis present

## 2018-06-30 DIAGNOSIS — R109 Unspecified abdominal pain: Secondary | ICD-10-CM | POA: Diagnosis not present

## 2018-06-30 DIAGNOSIS — R17 Unspecified jaundice: Secondary | ICD-10-CM

## 2018-06-30 DIAGNOSIS — Z170 Estrogen receptor positive status [ER+]: Secondary | ICD-10-CM | POA: Diagnosis not present

## 2018-06-30 DIAGNOSIS — K859 Acute pancreatitis without necrosis or infection, unspecified: Secondary | ICD-10-CM

## 2018-06-30 DIAGNOSIS — K831 Obstruction of bile duct: Secondary | ICD-10-CM

## 2018-06-30 DIAGNOSIS — Z9884 Bariatric surgery status: Secondary | ICD-10-CM

## 2018-06-30 DIAGNOSIS — Z8249 Family history of ischemic heart disease and other diseases of the circulatory system: Secondary | ICD-10-CM

## 2018-06-30 DIAGNOSIS — Z87442 Personal history of urinary calculi: Secondary | ICD-10-CM

## 2018-06-30 DIAGNOSIS — Z03818 Encounter for observation for suspected exposure to other biological agents ruled out: Secondary | ICD-10-CM | POA: Diagnosis not present

## 2018-06-30 DIAGNOSIS — K769 Liver disease, unspecified: Secondary | ICD-10-CM | POA: Diagnosis present

## 2018-06-30 DIAGNOSIS — Z6832 Body mass index (BMI) 32.0-32.9, adult: Secondary | ICD-10-CM

## 2018-06-30 DIAGNOSIS — Z888 Allergy status to other drugs, medicaments and biological substances status: Secondary | ICD-10-CM | POA: Diagnosis not present

## 2018-06-30 DIAGNOSIS — Z885 Allergy status to narcotic agent status: Secondary | ICD-10-CM

## 2018-06-30 DIAGNOSIS — E876 Hypokalemia: Secondary | ICD-10-CM | POA: Diagnosis not present

## 2018-06-30 DIAGNOSIS — K8689 Other specified diseases of pancreas: Secondary | ICD-10-CM | POA: Diagnosis not present

## 2018-06-30 DIAGNOSIS — Z853 Personal history of malignant neoplasm of breast: Secondary | ICD-10-CM | POA: Diagnosis not present

## 2018-06-30 DIAGNOSIS — C50411 Malignant neoplasm of upper-outer quadrant of right female breast: Secondary | ICD-10-CM

## 2018-06-30 DIAGNOSIS — Z79811 Long term (current) use of aromatase inhibitors: Secondary | ICD-10-CM

## 2018-06-30 DIAGNOSIS — E669 Obesity, unspecified: Secondary | ICD-10-CM | POA: Diagnosis present

## 2018-06-30 DIAGNOSIS — Z833 Family history of diabetes mellitus: Secondary | ICD-10-CM

## 2018-06-30 DIAGNOSIS — Z811 Family history of alcohol abuse and dependence: Secondary | ICD-10-CM

## 2018-06-30 DIAGNOSIS — R935 Abnormal findings on diagnostic imaging of other abdominal regions, including retroperitoneum: Secondary | ICD-10-CM | POA: Diagnosis not present

## 2018-06-30 DIAGNOSIS — Z823 Family history of stroke: Secondary | ICD-10-CM

## 2018-06-30 LAB — URINALYSIS, ROUTINE W REFLEX MICROSCOPIC
Bacteria, UA: NONE SEEN
Glucose, UA: NEGATIVE mg/dL
Ketones, ur: 5 mg/dL — AB
Leukocytes,Ua: NEGATIVE
Nitrite: NEGATIVE
Protein, ur: NEGATIVE mg/dL
Specific Gravity, Urine: 1.024 (ref 1.005–1.030)
pH: 5 (ref 5.0–8.0)

## 2018-06-30 LAB — AMYLASE: Amylase: 39 U/L (ref 21–101)

## 2018-06-30 LAB — CBC
HCT: 43.2 % (ref 36.0–46.0)
Hemoglobin: 14.5 g/dL (ref 12.0–15.0)
MCH: 31.6 pg (ref 26.0–34.0)
MCHC: 33.6 g/dL (ref 30.0–36.0)
MCV: 94.1 fL (ref 80.0–100.0)
Platelets: 311 10*3/uL (ref 150–400)
RBC: 4.59 MIL/uL (ref 3.87–5.11)
RDW: 13.6 % (ref 11.5–15.5)
WBC: 7.2 10*3/uL (ref 4.0–10.5)
nRBC: 0 % (ref 0.0–0.2)

## 2018-06-30 LAB — CBC WITH DIFFERENTIAL/PLATELET
Absolute Monocytes: 516 cells/uL (ref 200–950)
Basophils Absolute: 47 cells/uL (ref 0–200)
Basophils Relative: 0.7 %
Eosinophils Absolute: 27 cells/uL (ref 15–500)
Eosinophils Relative: 0.4 %
HCT: 44.8 % (ref 35.0–45.0)
Hemoglobin: 15.1 g/dL (ref 11.7–15.5)
Lymphs Abs: 1454 cells/uL (ref 850–3900)
MCH: 31.1 pg (ref 27.0–33.0)
MCHC: 33.7 g/dL (ref 32.0–36.0)
MCV: 92.2 fL (ref 80.0–100.0)
MPV: 9.8 fL (ref 7.5–12.5)
Monocytes Relative: 7.7 %
Neutro Abs: 4657 cells/uL (ref 1500–7800)
Neutrophils Relative %: 69.5 %
Platelets: 356 10*3/uL (ref 140–400)
RBC: 4.86 10*6/uL (ref 3.80–5.10)
RDW: 12.7 % (ref 11.0–15.0)
Total Lymphocyte: 21.7 %
WBC: 6.7 10*3/uL (ref 3.8–10.8)

## 2018-06-30 LAB — COMPLETE METABOLIC PANEL WITH GFR
AG Ratio: 1.6 (calc) (ref 1.0–2.5)
ALT: 807 U/L — ABNORMAL HIGH (ref 6–29)
AST: 256 U/L — ABNORMAL HIGH (ref 10–35)
Albumin: 4.1 g/dL (ref 3.6–5.1)
Alkaline phosphatase (APISO): 347 U/L — ABNORMAL HIGH (ref 37–153)
BUN: 10 mg/dL (ref 7–25)
CO2: 27 mmol/L (ref 20–32)
Calcium: 9.6 mg/dL (ref 8.6–10.4)
Chloride: 100 mmol/L (ref 98–110)
Creat: 0.72 mg/dL (ref 0.50–0.99)
GFR, Est African American: 101 mL/min/{1.73_m2} (ref >=60)
GFR, Est Non African American: 87 mL/min/{1.73_m2} (ref >=60)
Globulin: 2.6 g/dL (calc) (ref 1.9–3.7)
Glucose, Bld: 141 mg/dL — ABNORMAL HIGH (ref 65–99)
Potassium: 3.7 mmol/L (ref 3.5–5.3)
Sodium: 139 mmol/L (ref 135–146)
Total Bilirubin: 6.8 mg/dL — ABNORMAL HIGH (ref 0.2–1.2)
Total Protein: 6.7 g/dL (ref 6.1–8.1)

## 2018-06-30 LAB — URINALYSIS W MICROSCOPIC + REFLEX CULTURE
Bacteria, UA: NONE SEEN /HPF
Glucose, UA: NEGATIVE
Hyaline Cast: NONE SEEN /LPF
Leukocyte Esterase: NEGATIVE
Nitrites, Initial: NEGATIVE
Protein, ur: NEGATIVE
Specific Gravity, Urine: 1.012 (ref 1.001–1.03)
Squamous Epithelial / HPF: NONE SEEN /HPF (ref <=5)
WBC, UA: NONE SEEN /HPF (ref 0–5)
pH: 7 (ref 5.0–8.0)

## 2018-06-30 LAB — COMPREHENSIVE METABOLIC PANEL
ALT: 591 U/L — ABNORMAL HIGH (ref 0–44)
AST: 193 U/L — ABNORMAL HIGH (ref 15–41)
Albumin: 4 g/dL (ref 3.5–5.0)
Alkaline Phosphatase: 368 U/L — ABNORMAL HIGH (ref 38–126)
Anion gap: 12 (ref 5–15)
BUN: 16 mg/dL (ref 8–23)
CO2: 24 mmol/L (ref 22–32)
Calcium: 9.1 mg/dL (ref 8.9–10.3)
Chloride: 103 mmol/L (ref 98–111)
Creatinine, Ser: 0.66 mg/dL (ref 0.44–1.00)
GFR calc Af Amer: >60 mL/min (ref >60)
GFR calc non Af Amer: >60 mL/min (ref >60)
Glucose, Bld: 177 mg/dL — ABNORMAL HIGH (ref 70–99)
Potassium: 3.4 mmol/L — ABNORMAL LOW (ref 3.5–5.1)
Sodium: 139 mmol/L (ref 135–145)
Total Bilirubin: 8 mg/dL — ABNORMAL HIGH (ref 0.3–1.2)
Total Protein: 7.5 g/dL (ref 6.5–8.1)

## 2018-06-30 LAB — LIPASE, BLOOD: Lipase: 76 U/L — ABNORMAL HIGH (ref 11–51)

## 2018-06-30 LAB — H. PYLORI BREATH TEST: H. pylori Breath Test: NOT DETECTED

## 2018-06-30 LAB — LIPASE: Lipase: 140 U/L — ABNORMAL HIGH (ref 7–60)

## 2018-06-30 LAB — NO CULTURE INDICATED

## 2018-06-30 LAB — SARS CORONAVIRUS 2 BY RT PCR (HOSPITAL ORDER, PERFORMED IN ~~LOC~~ HOSPITAL LAB): SARS Coronavirus 2: NEGATIVE

## 2018-06-30 MED ORDER — ENOXAPARIN SODIUM 40 MG/0.4ML ~~LOC~~ SOLN
40.0000 mg | SUBCUTANEOUS | Status: DC
  Administered 2018-06-30: 40 mg via SUBCUTANEOUS
  Filled 2018-06-30: qty 0.4

## 2018-06-30 MED ORDER — PANTOPRAZOLE SODIUM 40 MG PO TBEC
40.0000 mg | DELAYED_RELEASE_TABLET | Freq: Every day | ORAL | Status: DC
  Administered 2018-07-02: 40 mg via ORAL
  Filled 2018-06-30: qty 1

## 2018-06-30 MED ORDER — SODIUM CHLORIDE 0.9% FLUSH: 3.0000 mL | Freq: Once | INTRAVENOUS | Status: DC

## 2018-06-30 MED ORDER — POTASSIUM CHLORIDE CRYS ER 20 MEQ PO TBCR
40.0000 meq | EXTENDED_RELEASE_TABLET | Freq: Once | ORAL | Status: AC
  Administered 2018-06-30: 40 meq via ORAL
  Filled 2018-06-30: qty 2

## 2018-06-30 MED ORDER — VERAPAMIL HCL 80 MG PO TABS
80.0000 mg | ORAL_TABLET | Freq: Two times a day (BID) | ORAL | Status: DC
  Administered 2018-06-30 – 2018-07-02 (×3): 80 mg via ORAL
  Filled 2018-06-30 (×5): qty 1

## 2018-06-30 MED ORDER — ASPIRIN EC 81 MG PO TBEC
81.0000 mg | DELAYED_RELEASE_TABLET | Freq: Every day | ORAL | Status: DC
  Administered 2018-07-02: 81 mg via ORAL
  Filled 2018-06-30: qty 1

## 2018-06-30 MED ORDER — TRAZODONE HCL 50 MG PO TABS
150.0000 mg | ORAL_TABLET | Freq: Every day | ORAL | Status: DC
  Administered 2018-06-30 – 2018-07-01 (×2): 150 mg via ORAL
  Filled 2018-06-30 (×2): qty 1

## 2018-06-30 MED ORDER — ANASTROZOLE 1 MG PO TABS
1.0000 mg | ORAL_TABLET | Freq: Every day | ORAL | Status: DC
  Administered 2018-07-02: 1 mg via ORAL
  Filled 2018-06-30 (×2): qty 1

## 2018-06-30 MED ORDER — ATENOLOL 50 MG PO TABS
100.0000 mg | ORAL_TABLET | Freq: Every day | ORAL | Status: DC
  Administered 2018-06-30: 100 mg via ORAL
  Filled 2018-06-30 (×2): qty 1

## 2018-06-30 MED ORDER — PIPERACILLIN-TAZOBACTAM 3.375 G IVPB 30 MIN
3.3750 g | Freq: Once | INTRAVENOUS | Status: AC
  Administered 2018-06-30: 3.375 g via INTRAVENOUS
  Filled 2018-06-30: qty 50

## 2018-06-30 MED ORDER — PIPERACILLIN-TAZOBACTAM 3.375 G IVPB
3.3750 g | Freq: Three times a day (TID) | INTRAVENOUS | Status: DC
  Administered 2018-06-30 – 2018-07-02 (×5): 3.375 g via INTRAVENOUS
  Filled 2018-06-30 (×5): qty 50

## 2018-06-30 MED ORDER — ROSUVASTATIN CALCIUM 20 MG PO TABS
40.0000 mg | ORAL_TABLET | Freq: Every day | ORAL | Status: DC
  Administered 2018-07-02: 40 mg via ORAL
  Filled 2018-06-30: qty 2
  Filled 2018-06-30: qty 1

## 2018-06-30 MED ORDER — ONDANSETRON HCL 4 MG PO TABS: 4.0000 mg | ORAL_TABLET | Freq: Three times a day (TID) | ORAL | Status: DC | PRN

## 2018-06-30 NOTE — Progress Notes (Signed)
Pharmacy Antibiotic Note  Taylor Oconnor is a 68 y.o. female admitted on 06/30/2018 with intra-abdominal infection.  Pharmacy has been consulted for piperacillin/tazobactam dosing.  Pt admitted with biliary obstruction, planning for ERCP tomorrow.   Today, 06/30/18  WBC 7.2 - WNL  Afebrile  Plan:  Piperacillin/tazobactam 3.375 g IV q8h EI  Pharmacy to sign off, please re-consult if needed. Will follow dosing peripherally.  Height: 5' 3.5" (161.3 cm) Weight: 188 lb (85.3 kg) IBW/kg (Calculated) : 53.55  Temp (24hrs), Avg:98.3 F (36.8 C), Min:98.3 F (36.8 C), Max:98.3 F (36.8 C)  Recent Labs  Lab 06/29/18 1136 06/30/18 1438  WBC 6.7 7.2  CREATININE 0.72 0.66    Estimated Creatinine Clearance: 72.4 mL/min (by C-G formula based on SCr of 0.66 mg/dL).    Allergies  Allergen Reactions  . Codeine Itching  . Gabapentin Other (See Comments)    Reaction unknown  . Lipitor [Atorvastatin] Other (See Comments)    Severe fatigue. ? memory loss.  . Ace Inhibitors Cough    Antimicrobials this admission: Piperacillin/tazobactam 6/18 >>   Dose adjustments this admission:  Microbiology results: 6/18 COVID-19: Negative  Thank you for allowing pharmacy to be a part of this patient's care.  Lenis Noon, PharmD 06/30/2018 4:23 PM

## 2018-06-30 NOTE — Consult Note (Addendum)
Referring Provider:  EDP- Dr. Alvino Chapel         Primary Care Physician:  Unk Pinto, MD Primary Gastroenterologist:   Denzil Magnuson, MD          Reason for Consultation:   Biliary obstruction        ASSESSMENT /  PLAN    67. 68 year old female with biliary obstruction and ? fullness in pancreatic head on non-contrast CTAP. Lipase not quite 3x ULN. Mild pancreatitis not excluded.  -MRCP prior to ERCP? Given radiologic and laboratory findings we may opt to just proceed with ERCP tomorrow. I discussed at length the risks / benefits of ERCP with the patient and she agrees to proceed.   -Clear liquids today, NPO after midnight.  -No evidence for cholangitis but + fevers at home so will start Zosyn.   2. Obesity, has lap band.      Attending Physician Note   I have taken a history, examined the patient and reviewed the chart. I agree with the Advanced Practitioner's note, impression and recommendations.  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 R/O pancreatic mass, choledocholithiasis.   * Mildly elevated lipase with normal amylase - doubt pancreatitis  * S/P lap band  Recommendations: * ERCP tomorrow for further evaluation, treatment * IV Zosyn for now * Repeat CBC, CMP, lipase, amylase and check PT/INR in am  Lucio Edward, MD [facility] [id number]     BRIEF HISTORY   KERRYN TENNANT is a 68 y.o. female with a PMH of DM 2, hypertension, CKD 2, vitamin D deficiency, breast cancer maintained on anastrozole, GERD and history of kidney stones.  She is status post remote cholecystectomy.  She is status post laparoscopic band 2011. Patient is known to Dr. Silverio Decamp who performed a screening colonoscopy on her December 2016.  Patient sent by PCP to ED for evaluation of abdominal pain, abnormal liver test and abnormal CT scan remarkable for biliary duct dilation   ED EVALUATION  Hemodynamically  stable Labs not repeated, they were done by PCP yesterday.   HPI:   Ms. Carrol developed progressive abdominal pain on Saturday.  Pain in the upper abdomen with radiation through to her back.  Pain worse with certain movements and deep breaths but not necessarily related to eating.  She has basically been on the couch over the last few days because of this abdominal pain.  She has had some associated nausea.  Currently her pain is 4 out of 10 . She reports low-grade fevers up to 100.3 at home.  Patient has a lap band placed in 2011.  She originally lost approximately 60 pounds but regained all of it back.  She has recently been dieting so her 29 pound weight loss since March is explainable.  No itching.  She has noticed dark urine.  Pertinent labs:  Labs obtained by PCP yesterday pertinent for lipase of 140, AST 256, ALT 807, total bilirubin 6.8.  Does not appear that alkaline phosphatase was included in these labs.  CBC normal.  Of note her liver tests were normal at time of routine physical late April 2020.    Past Medical History:  Diagnosis Date   Allergy    SEASONAL   Anxiety    At risk for difficult insertion of breathing tube    Breast cancer ([place])    Breast cancer of upper-outer quadrant of right female breast ([place]) 12/03/2014  Difficult intubation pt has large tonsils,   GERD (gastroesophageal reflux disease)    hx - resolved with lap band surgery   H/O hiatal hernia    resolved with band surgery   H/O laparoscopic adjustable gastric banding 2010   Hyperlipidemia    Hypertension    Insomnia    tx with zanax   Personal history of radiation therapy    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) 12/14/2012   Type II or unspecified type diabetes mellitus without mention of complication, not stated as uncontrolled 12/14/2012   no meds, diet controlled    Past Surgical History:  Procedure Laterality Date   APPENDECTOMY  08/06/12   lap appy   BACK SURGERY      cervical   BREAST BIOPSY     BREAST LUMPECTOMY Right 2016   BREAST LUMPECTOMY WITH NEEDLE LOCALIZATION AND AXILLARY SENTINEL LYMPH NODE BX Right 12/28/2014   Procedure: RIGHT BREAST LUMPECTOMY WITH TWO (2) NEEDLE LOCALIZATION AND AXILLARY SENTINEL LYMPH NODE BX;  Surgeon: Alphonsa Overall, MD;  Location: [facility];  Service: General;  Laterality: Right;   CESAREAN SECTION     x 1   CHOLECYSTECTOMY     COLONOSCOPY  09/29/2004   normal   CYSTOSCOPY WITH RETROGRADE PYELOGRAM, URETEROSCOPY AND STENT PLACEMENT Right 03/24/2018   Procedure: CYSTOSCOPY WITH RETROGRADE PYELOGRAM, URETEROSCOPY AND STENT PLACEMENT;  Surgeon: Franchot Gallo, MD;  Location: [facility];  Service: Urology;  Laterality: Right;  1 HR   DIAGNOSTIC LAPAROSCOPY     HERNIA REPAIR  2008   LAPAROSCOPIC APPENDECTOMY N/A 08/06/2012   Procedure: APPENDECTOMY LAPAROSCOPIC;  Surgeon: Earnstine Regal, MD;  Location: [facility];  Service: General;  Laterality: N/A;   LAPAROSCOPIC GASTRIC BANDING WITH HIATAL HERNIA REPAIR N/A Nov 2008   TUBAL LIGATION      Prior to Admission medications   Medication Sig Start Date End Date Taking? Authorizing Provider  acidophilus (RISAQUAD) CAPS Take 1 capsule by mouth daily after supper. Reported on 02/19/2015   Yes [provider]  anastrozole (ARIMIDEX) 1 MG tablet TAKE 1 TABLET BY MOUTH EVERY DAY Patient taking differently: Take 1 mg by mouth daily.  03/21/18  Yes Causey, Charlestine Massed, NP  aspirin EC 81 MG tablet Take 81 mg by mouth daily.   Yes [provider]  atenolol (TENORMIN) 100 MG tablet TAKE 1 TABLET BY MOUTH EVERY DAY Patient taking differently: Take 50 mg by mouth daily after supper.  03/19/18  Yes Unk Pinto, MD  b complex vitamins capsule Take 1 capsule by mouth daily after supper.    Yes [provider]  BIOTIN PO Take 1 capsule by mouth daily after supper.   Yes [provider]  calcium carbonate (TUMS)  500 MG chewable tablet Chew 1,000 mg by mouth daily after supper.    Yes [provider]  Cholecalciferol (VITAMIN D3) 5000 UNITS CAPS Take 5,000 Units by mouth daily after supper.    Yes [provider]  Cinnamon 500 MG TABS Take 500 mg by mouth daily after supper.    Yes [provider]  Magnesium 250 MG TABS Take 250 mg by mouth 2 (two) times daily.   Yes [provider]  Multiple Vitamins-Minerals (MULTIVITAMIN WITH MINERALS) tablet Take 1 tablet by mouth 2 (two) times daily.   Yes [provider]  Omega-3 Fatty Acids (FISH OIL) 1000 MG CAPS Take 1,000 mg by mouth daily after supper.    Yes [provider]  omeprazole (PRILOSEC) 20 MG capsule Take 1 capsule (20 mg total) by mouth 2 (two) times daily before a meal for 14 days. 14 days 06/29/18 07/13/18 Yes Liane Comber, NP  ondansetron (ZOFRAN) 4 MG tablet Take 1 tablet (4 mg total) by mouth every 8 (eight) hours as needed for nausea or vomiting. 02/27/18  Yes Rolland Porter, MD  OVER THE COUNTER MEDICATION Take 1 tablet by mouth 2 (two) times daily. Tumeric with black pepper   Yes [provider]  promethazine (PHENERGAN) 25 MG tablet Take 1/2-1 tab every 6 hours as needed for nausea. Patient taking differently: Take 12.5-25 mg by mouth every 6 (six) hours as needed for nausea.  06/29/18  Yes Liane Comber, NP  rosuvastatin (CRESTOR) 40 MG tablet Take 1 tablet daily for Cholesterol Patient taking differently: Take 40 mg by mouth daily.  03/19/18  Yes Unk Pinto, MD  traZODone (DESYREL) 150 MG tablet Take 1/2 to 1 tablet 1 to 2 hours before Bedtime Patient taking differently: Take 150 mg by mouth.  06/01/18  Yes Unk Pinto, MD  verapamil (CALAN) 80 MG tablet Take 1 tablet 2 x /day with meal for BP Patient taking differently: Take 80 mg by mouth 2 (two) times daily with a meal.  02/14/18  Yes Unk Pinto, MD  Zinc 50 MG TABS Take 50 mg by mouth daily after supper.   Yes  [provider]    No current facility-administered medications for this encounter.    Current Outpatient Medications  Medication Sig Dispense Refill   acidophilus (RISAQUAD) CAPS Take 1 capsule by mouth daily after supper. Reported on 02/19/2015     anastrozole (ARIMIDEX) 1 MG tablet TAKE 1 TABLET BY MOUTH EVERY DAY (Patient taking differently: Take 1 mg by mouth daily. ) 90 tablet 3   aspirin EC 81 MG tablet Take 81 mg by mouth daily.     atenolol (TENORMIN) 100 MG tablet TAKE 1 TABLET BY MOUTH EVERY DAY (Patient taking differently: Take 50 mg by mouth daily after supper. ) 90 tablet 3   b complex vitamins capsule Take 1 capsule by mouth daily after supper.      BIOTIN PO Take 1 capsule by mouth daily after supper.     calcium carbonate (TUMS) 500 MG chewable tablet Chew 1,000 mg by mouth daily after supper.      Cholecalciferol (VITAMIN D3) 5000 UNITS CAPS Take 5,000 Units by mouth daily after supper.      Cinnamon 500 MG TABS Take 500 mg by mouth daily after supper.      Magnesium 250 MG TABS Take 250 mg by mouth 2 (two) times daily.     Multiple Vitamins-Minerals (MULTIVITAMIN WITH MINERALS) tablet Take 1 tablet by mouth 2 (two) times daily.     Omega-3 Fatty Acids (FISH OIL) 1000 MG CAPS Take 1,000 mg by mouth daily after supper.      omeprazole (PRILOSEC) 20 MG capsule Take 1 capsule (20 mg total) by mouth 2 (two) times daily before a meal for 14 days. 14 days 28 capsule 0   ondansetron (ZOFRAN) 4 MG tablet Take 1 tablet (4 mg total) by mouth every 8 (eight) hours as needed for nausea or vomiting. 10 tablet 0   OVER THE COUNTER MEDICATION Take 1 tablet by mouth 2 (two) times daily. Tumeric with black pepper     promethazine (PHENERGAN) 25 MG tablet Take 1/2-1 tab every 6 hours as needed for nausea. (Patient taking differently: Take 12.5-25 mg  by mouth every 6 (six) hours as needed for nausea. ) 60 tablet 3   rosuvastatin (CRESTOR) 40 MG tablet Take 1 tablet daily  for Cholesterol (Patient taking differently: Take 40 mg by mouth daily. ) 90 tablet 3   traZODone (DESYREL) 150 MG tablet Take 1/2 to 1 tablet 1 to 2 hours before Bedtime (Patient taking differently: Take 150 mg by mouth. ) 90 tablet 3   verapamil (CALAN) 80 MG tablet Take 1 tablet 2 x /day with meal for BP (Patient taking differently: Take 80 mg by mouth 2 (two) times daily with a meal. ) 180 tablet 3   Zinc 50 MG TABS Take 50 mg by mouth daily after supper.      Allergies as of 06/30/2018 - Review Complete 06/30/2018  Allergen Reaction Noted   Codeine Itching 08/06/2012   Gabapentin Other (See Comments) 12/02/2017   Lipitor [atorvastatin] Other (See Comments) 12/13/2012   Ace inhibitors Cough 12/13/2012    Family History  Problem Relation Age of Onset   Cirrhosis Mother    Alcohol abuse Mother    Liver disease Mother    Diabetes Father    Hypertension Father    Stroke Father    Heart disease Father    AAA (abdominal aortic aneurysm) Sister    Cancer Cousin        Breast   Breast cancer Cousin        x3   Colon cancer Neg Hx    Rectal cancer Neg Hx    Stomach cancer Neg Hx     Social History   Socioeconomic History   Marital status: Married    Spouse name: Not on file   Number of children: Not on file   Years of education: Not on file   Highest education level: Not on file  Occupational History   Not on file  Social Needs   Financial resource strain: Not on file   Food insecurity    Worry: Not on file    Inability: Not on file   Transportation needs    Medical: Not on file    Non-medical: Not on file  Tobacco Use   Smoking status: Never Smoker   Smokeless tobacco: Never Used  Substance and Sexual Activity   Alcohol use: No    Alcohol/week: 0.0 standard drinks   Drug use: No   Sexual activity: Not Currently    Birth control/protection: Post-menopausal  Lifestyle   Physical activity    Days per week: Not on file     Minutes per session: Not on file   Stress: Not on file  Relationships   Social connections    Talks on phone: Not on file    Gets together: Not on file    Attends religious service: Not on file    Active member of club or organization: Not on file    Attends meetings of clubs or organizations: Not on file    Relationship status: Not on file   Intimate partner violence    Fear of current or ex partner: Not on file    Emotionally abused: Not on file    Physically abused: Not on file    Forced sexual activity: Not on file  Other Topics Concern   Not on file  Social History Narrative   Not on file    Review of Systems: All systems reviewed and negative except where noted in HPI.  Physical Exam: Vital signs in last 24 hours: Temp:  [98.3  F (36.8 C)] 98.3 F (36.8 C) (06/18 1335) Pulse Rate:  [73-84] 73 (06/18 1436) Resp:  [16-18] 18 (06/18 1436) BP: (156-166)/(88-94) 156/88 (06/18 1436) SpO2:  [95 %-97 %] 97 % (06/18 1436) Weight:  [85.3 kg] 85.3 kg (06/18 1335)   General:   Alert, well-developed,  female in NAD Psych:  Pleasant, cooperative. Normal mood and affect. Eyes:  Pupils equal, icteric sclera. Ears:  Normal auditory acuity. Nose:  No deformity, discharge,  or lesions. Neck:  Supple; no masses Lungs:  Clear throughout to auscultation.   No wheezes, crackles, or rhonchi.  Heart:  Regular rate and rhythm; no murmurs, no lower extremity edema Abdomen:  Soft, non-distended, nontender, BS active, no palp mass    Rectal:  Deferred  Msk:  Symmetrical without gross deformities. . Neurologic:  Alert and  oriented x4;  grossly normal neurologically. Skin:  Intact without significant lesions or rashes.   Intake/Output from previous day: No intake/output data recorded. Intake/Output this shift: No intake/output data recorded.  Lab Results: Recent Labs    06/29/18 1136 06/30/18 1438  WBC 6.7 7.2  HGB 15.1 14.5  HCT 44.8 43.2  PLT 356 311   BMET Recent  Labs    06/29/18 1136  NA 139  K 3.7  CL 100  CO2 27  GLUCOSE 141*  BUN 10  CREATININE 0.72  CALCIUM 9.6   LFT Recent Labs    06/29/18 1136  PROT 6.7  AST 256*  ALT 807*  BILITOT 6.8*    . CBC Latest Ref Rng & Units 06/30/2018 06/29/2018 05/03/2018  WBC 4.0 - 10.5 K/uL 7.2 6.7 6.9  Hemoglobin 12.0 - 15.0 g/dL 14.5 15.1 14.3  Hematocrit 36.0 - 46.0 % 43.2 44.8 42.2  Platelets 150 - 400 K/uL 311 356 391    . CMP Latest Ref Rng & Units 06/29/2018 05/03/2018 03/24/2018  Glucose 65 - 99 mg/dL 141(H) 104(H) 110(H)  BUN 7 - 25 mg/dL 10 21 -  Creatinine 0.50 - 0.99 mg/dL 0.72 0.82 -  Sodium 135 - 146 mmol/L 139 137 141  Potassium 3.5 - 5.3 mmol/L 3.7 4.1 4.0  Chloride 98 - 110 mmol/L 100 103 -  CO2 20 - 32 mmol/L 27 25 -  Calcium 8.6 - 10.4 mg/dL 9.6 9.7 -  Total Protein 6.1 - 8.1 g/dL 6.7 6.6 -  Total Bilirubin 0.2 - 1.2 mg/dL 6.8(H) 0.5 -  Alkaline Phos 38 - 126 U/L - - -  AST 10 - 35 U/L 256(H) 13 -  ALT 6 - 29 U/L 807(H) 14 -   Studies/Results: Ct Abdomen Pelvis Wo Contrast  Result Date: 06/30/2018 CLINICAL DATA:  Abdominal pain and swelling, decreased appetite. Abnormal lab work. EXAM: CT ABDOMEN AND PELVIS WITHOUT CONTRAST TECHNIQUE: Multidetector CT imaging of the abdomen and pelvis was performed following the standard protocol without IV contrast. COMPARISON:  CT abdomen dated 02/27/2018. FINDINGS: Lower chest: No acute abnormality. Hepatobiliary: New common bile duct dilatation and intrahepatic bile duct dilatation. CBD measures 1.7 cm diameter. No focal liver abnormality. Status post cholecystectomy. Pancreas: New fullness of the pancreatic head. No pancreatic ductal dilatation seen. Spleen: Normal in size without focal abnormality. Adrenals/Urinary Tract: Adrenal glands appear normal. Kidneys are unremarkable without mass, stone or hydronephrosis. No ureteral or bladder calculi identified. Bladder is decompressed. Stomach/Bowel: No dilated large or small bowel loops. No  evidence of bowel wall inflammation. Status post appendectomy. Lap band appears stable in position/configuration without evidence of complicating feature. Stomach otherwise unremarkable. Vascular/Lymphatic: No  significant vascular findings are present. No enlarged abdominal or pelvic lymph nodes. Reproductive: Uterus and bilateral adnexa are unremarkable. Other: No free fluid or abscess collection seen. No free intraperitoneal air. Musculoskeletal: No acute or suspicious osseous finding. Mild degenerative spondylosis of the lumbar spine. IMPRESSION: 1. New common bile duct dilatation and intrahepatic bile duct dilatation. New fullness of the pancreatic head which could represent rapidly developing obstructive neoplastic process or sequela of recent localized pancreatitis. Recommend further characterization with MRCP or ERCP. 2. No other acute or significant findings. No bowel obstruction or evidence of bowel wall inflammation. No free fluid or abscess collection. No free intraperitoneal air. 3. Additional chronic/incidental findings detailed above. These results will be called to the ordering clinician or representative by the Radiologist Assistant, and communication documented in the PACS or zVision Dashboard. Electronically Signed   By: Franki Cabot M.D.   On: 06/30/2018 11:23    Tye Savoy, NP-C @  06/30/2018, 3:04 PM

## 2018-06-30 NOTE — ED Triage Notes (Signed)
Patient was sent by her PCP today for elevated LFT's and lipase. CT showed bile duct dilatation and new fullness of the pancreatic head PCP sent paper stating that an urgent need for [facility] and they were expecting. Stated, It was recommended that the ED evaluate, admit for possible ERCP/MRCP.

## 2018-06-30 NOTE — H&P (View-Only) (Signed)
Referring Provider:  EDP- Dr. Alvino Chapel         Primary Care Physician:  Unk Pinto, MD Primary Gastroenterologist:   Denzil Magnuson, MD          Reason for Consultation:   Biliary obstruction        ASSESSMENT /  PLAN    76. 68 year old female with biliary obstruction and ? fullness in pancreatic head on non-contrast CTAP. Lipase not quite 3x ULN. Mild pancreatitis not excluded.  -MRCP prior to ERCP? Given radiologic and laboratory findings we may opt to just proceed with ERCP tomorrow. I discussed at length the risks / benefits of ERCP with the patient and she agrees to proceed.   -Clear liquids today, NPO after midnight.  -No evidence for cholangitis but + fevers at home so will start Zosyn.   2. Obesity, has lap band.      Attending Physician Note   I have taken a history, examined the patient and reviewed the chart. I agree with the Advanced Practitioner's note, impression and recommendations.  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 R/O pancreatic mass, choledocholithiasis.   * Mildly elevated lipase with normal amylase - doubt pancreatitis  * S/P lap band  Recommendations: * ERCP tomorrow for further evaluation, treatment * IV Zosyn for now * Repeat CBC, CMP, lipase, amylase and check PT/INR in am  Lucio Edward, MD [facility] [phone]     BRIEF HISTORY   KARRIN EISENMENGER is a 68 y.o. female with a PMH of DM 2, hypertension, CKD 2, vitamin D deficiency, breast cancer maintained on anastrozole, GERD and history of kidney stones.  She is status post remote cholecystectomy.  She is status post laparoscopic band 2011. Patient is known to Dr. Silverio Decamp who performed a screening colonoscopy on her December 2016.  Patient sent by PCP to ED for evaluation of abdominal pain, abnormal liver test and abnormal CT scan remarkable for biliary duct dilation   ED EVALUATION  Hemodynamically  stable Labs not repeated, they were done by PCP yesterday.   HPI:   Ms. Tecson developed progressive abdominal pain on Saturday.  Pain in the upper abdomen with radiation through to her back.  Pain worse with certain movements and deep breaths but not necessarily related to eating.  She has basically been on the couch over the last few days because of this abdominal pain.  She has had some associated nausea.  Currently her pain is 4 out of 10 . She reports low-grade fevers up to 100.3 at home.  Patient has a lap band placed in 2011.  She originally lost approximately 60 pounds but regained all of it back.  She has recently been dieting so her 29 pound weight loss since March is explainable.  No itching.  She has noticed dark urine.  Pertinent labs:  Labs obtained by PCP yesterday pertinent for lipase of 140, AST 256, ALT 807, total bilirubin 6.8.  Does not appear that alkaline phosphatase was included in these labs.  CBC normal.  Of note her liver tests were normal at time of routine physical late April 2020.    Past Medical History:  Diagnosis Date   Allergy    SEASONAL   Anxiety    At risk for difficult insertion of breathing tube    Breast cancer ([place])    Breast cancer of upper-outer quadrant of right female breast ([place]) 12/03/2014  Difficult intubation pt has large tonsils,   GERD (gastroesophageal reflux disease)    hx - resolved with lap band surgery   H/O hiatal hernia    resolved with band surgery   H/O laparoscopic adjustable gastric banding 2010   Hyperlipidemia    Hypertension    Insomnia    tx with zanax   Personal history of radiation therapy    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) 12/14/2012   Type II or unspecified type diabetes mellitus without mention of complication, not stated as uncontrolled 12/14/2012   no meds, diet controlled    Past Surgical History:  Procedure Laterality Date   APPENDECTOMY  08/06/12   lap appy   BACK SURGERY      cervical   BREAST BIOPSY     BREAST LUMPECTOMY Right 2016   BREAST LUMPECTOMY WITH NEEDLE LOCALIZATION AND AXILLARY SENTINEL LYMPH NODE BX Right 12/28/2014   Procedure: RIGHT BREAST LUMPECTOMY WITH TWO (2) NEEDLE LOCALIZATION AND AXILLARY SENTINEL LYMPH NODE BX;  Surgeon: Alphonsa Overall, MD;  Location: [facility];  Service: General;  Laterality: Right;   CESAREAN SECTION     x 1   CHOLECYSTECTOMY     COLONOSCOPY  09/29/2004   normal   CYSTOSCOPY WITH RETROGRADE PYELOGRAM, URETEROSCOPY AND STENT PLACEMENT Right 03/24/2018   Procedure: CYSTOSCOPY WITH RETROGRADE PYELOGRAM, URETEROSCOPY AND STENT PLACEMENT;  Surgeon: Franchot Gallo, MD;  Location: [facility];  Service: Urology;  Laterality: Right;  1 HR   DIAGNOSTIC LAPAROSCOPY     HERNIA REPAIR  2008   LAPAROSCOPIC APPENDECTOMY N/A 08/06/2012   Procedure: APPENDECTOMY LAPAROSCOPIC;  Surgeon: Earnstine Regal, MD;  Location: [facility];  Service: General;  Laterality: N/A;   LAPAROSCOPIC GASTRIC BANDING WITH HIATAL HERNIA REPAIR N/A Nov 2008   TUBAL LIGATION      Prior to Admission medications   Medication Sig Start Date End Date Taking? Authorizing Provider  acidophilus (RISAQUAD) CAPS Take 1 capsule by mouth daily after supper. Reported on 02/19/2015   Yes [provider]  anastrozole (ARIMIDEX) 1 MG tablet TAKE 1 TABLET BY MOUTH EVERY DAY Patient taking differently: Take 1 mg by mouth daily.  03/21/18  Yes Causey, Charlestine Massed, NP  aspirin EC 81 MG tablet Take 81 mg by mouth daily.   Yes [provider]  atenolol (TENORMIN) 100 MG tablet TAKE 1 TABLET BY MOUTH EVERY DAY Patient taking differently: Take 50 mg by mouth daily after supper.  03/19/18  Yes Unk Pinto, MD  b complex vitamins capsule Take 1 capsule by mouth daily after supper.    Yes [provider]  BIOTIN PO Take 1 capsule by mouth daily after supper.   Yes [provider]  calcium carbonate (TUMS)  500 MG chewable tablet Chew 1,000 mg by mouth daily after supper.    Yes [provider]  Cholecalciferol (VITAMIN D3) 5000 UNITS CAPS Take 5,000 Units by mouth daily after supper.    Yes [provider]  Cinnamon 500 MG TABS Take 500 mg by mouth daily after supper.    Yes [provider]  Magnesium 250 MG TABS Take 250 mg by mouth 2 (two) times daily.   Yes [provider]  Multiple Vitamins-Minerals (MULTIVITAMIN WITH MINERALS) tablet Take 1 tablet by mouth 2 (two) times daily.   Yes [provider]  Omega-3 Fatty Acids (FISH OIL) 1000 MG CAPS Take 1,000 mg by mouth daily after supper.    Yes [provider]  omeprazole (PRILOSEC) 20 MG capsule Take 1 capsule (20 mg total) by mouth 2 (two) times daily before a meal for 14 days. 14 days 06/29/18 07/13/18 Yes Liane Comber, NP  ondansetron (ZOFRAN) 4 MG tablet Take 1 tablet (4 mg total) by mouth every 8 (eight) hours as needed for nausea or vomiting. 02/27/18  Yes Rolland Porter, MD  OVER THE COUNTER MEDICATION Take 1 tablet by mouth 2 (two) times daily. Tumeric with black pepper   Yes [provider]  promethazine (PHENERGAN) 25 MG tablet Take 1/2-1 tab every 6 hours as needed for nausea. Patient taking differently: Take 12.5-25 mg by mouth every 6 (six) hours as needed for nausea.  06/29/18  Yes Liane Comber, NP  rosuvastatin (CRESTOR) 40 MG tablet Take 1 tablet daily for Cholesterol Patient taking differently: Take 40 mg by mouth daily.  03/19/18  Yes Unk Pinto, MD  traZODone (DESYREL) 150 MG tablet Take 1/2 to 1 tablet 1 to 2 hours before Bedtime Patient taking differently: Take 150 mg by mouth.  06/01/18  Yes Unk Pinto, MD  verapamil (CALAN) 80 MG tablet Take 1 tablet 2 x /day with meal for BP Patient taking differently: Take 80 mg by mouth 2 (two) times daily with a meal.  02/14/18  Yes Unk Pinto, MD  Zinc 50 MG TABS Take 50 mg by mouth daily after supper.   Yes  [provider]    No current facility-administered medications for this encounter.    Current Outpatient Medications  Medication Sig Dispense Refill   acidophilus (RISAQUAD) CAPS Take 1 capsule by mouth daily after supper. Reported on 02/19/2015     anastrozole (ARIMIDEX) 1 MG tablet TAKE 1 TABLET BY MOUTH EVERY DAY (Patient taking differently: Take 1 mg by mouth daily. ) 90 tablet 3   aspirin EC 81 MG tablet Take 81 mg by mouth daily.     atenolol (TENORMIN) 100 MG tablet TAKE 1 TABLET BY MOUTH EVERY DAY (Patient taking differently: Take 50 mg by mouth daily after supper. ) 90 tablet 3   b complex vitamins capsule Take 1 capsule by mouth daily after supper.      BIOTIN PO Take 1 capsule by mouth daily after supper.     calcium carbonate (TUMS) 500 MG chewable tablet Chew 1,000 mg by mouth daily after supper.      Cholecalciferol (VITAMIN D3) 5000 UNITS CAPS Take 5,000 Units by mouth daily after supper.      Cinnamon 500 MG TABS Take 500 mg by mouth daily after supper.      Magnesium 250 MG TABS Take 250 mg by mouth 2 (two) times daily.     Multiple Vitamins-Minerals (MULTIVITAMIN WITH MINERALS) tablet Take 1 tablet by mouth 2 (two) times daily.     Omega-3 Fatty Acids (FISH OIL) 1000 MG CAPS Take 1,000 mg by mouth daily after supper.      omeprazole (PRILOSEC) 20 MG capsule Take 1 capsule (20 mg total) by mouth 2 (two) times daily before a meal for 14 days. 14 days 28 capsule 0   ondansetron (ZOFRAN) 4 MG tablet Take 1 tablet (4 mg total) by mouth every 8 (eight) hours as needed for nausea or vomiting. 10 tablet 0   OVER THE COUNTER MEDICATION Take 1 tablet by mouth 2 (two) times daily. Tumeric with black pepper     promethazine (PHENERGAN) 25 MG tablet Take 1/2-1 tab every 6 hours as needed for nausea. (Patient taking differently: Take 12.5-25 mg  by mouth every 6 (six) hours as needed for nausea. ) 60 tablet 3   rosuvastatin (CRESTOR) 40 MG tablet Take 1 tablet daily  for Cholesterol (Patient taking differently: Take 40 mg by mouth daily. ) 90 tablet 3   traZODone (DESYREL) 150 MG tablet Take 1/2 to 1 tablet 1 to 2 hours before Bedtime (Patient taking differently: Take 150 mg by mouth. ) 90 tablet 3   verapamil (CALAN) 80 MG tablet Take 1 tablet 2 x /day with meal for BP (Patient taking differently: Take 80 mg by mouth 2 (two) times daily with a meal. ) 180 tablet 3   Zinc 50 MG TABS Take 50 mg by mouth daily after supper.      Allergies as of 06/30/2018 - Review Complete 06/30/2018  Allergen Reaction Noted   Codeine Itching 08/06/2012   Gabapentin Other (See Comments) 12/02/2017   Lipitor [atorvastatin] Other (See Comments) 12/13/2012   Ace inhibitors Cough 12/13/2012    Family History  Problem Relation Age of Onset   Cirrhosis Mother    Alcohol abuse Mother    Liver disease Mother    Diabetes Father    Hypertension Father    Stroke Father    Heart disease Father    AAA (abdominal aortic aneurysm) Sister    Cancer Cousin        Breast   Breast cancer Cousin        x3   Colon cancer Neg Hx    Rectal cancer Neg Hx    Stomach cancer Neg Hx     Social History   Socioeconomic History   Marital status: Married    Spouse name: Not on file   Number of children: Not on file   Years of education: Not on file   Highest education level: Not on file  Occupational History   Not on file  Social Needs   Financial resource strain: Not on file   Food insecurity    Worry: Not on file    Inability: Not on file   Transportation needs    Medical: Not on file    Non-medical: Not on file  Tobacco Use   Smoking status: Never Smoker   Smokeless tobacco: Never Used  Substance and Sexual Activity   Alcohol use: No    Alcohol/week: 0.0 standard drinks   Drug use: No   Sexual activity: Not Currently    Birth control/protection: Post-menopausal  Lifestyle   Physical activity    Days per week: Not on file     Minutes per session: Not on file   Stress: Not on file  Relationships   Social connections    Talks on phone: Not on file    Gets together: Not on file    Attends religious service: Not on file    Active member of club or organization: Not on file    Attends meetings of clubs or organizations: Not on file    Relationship status: Not on file   Intimate partner violence    Fear of current or ex partner: Not on file    Emotionally abused: Not on file    Physically abused: Not on file    Forced sexual activity: Not on file  Other Topics Concern   Not on file  Social History Narrative   Not on file    Review of Systems: All systems reviewed and negative except where noted in HPI.  Physical Exam: Vital signs in last 24 hours: Temp:  [98.3  F (36.8 C)] 98.3 F (36.8 C) (06/18 1335) Pulse Rate:  [73-84] 73 (06/18 1436) Resp:  [16-18] 18 (06/18 1436) BP: (156-166)/(88-94) 156/88 (06/18 1436) SpO2:  [95 %-97 %] 97 % (06/18 1436) Weight:  [85.3 kg] 85.3 kg (06/18 1335)   General:   Alert, well-developed,  female in NAD Psych:  Pleasant, cooperative. Normal mood and affect. Eyes:  Pupils equal, icteric sclera. Ears:  Normal auditory acuity. Nose:  No deformity, discharge,  or lesions. Neck:  Supple; no masses Lungs:  Clear throughout to auscultation.   No wheezes, crackles, or rhonchi.  Heart:  Regular rate and rhythm; no murmurs, no lower extremity edema Abdomen:  Soft, non-distended, nontender, BS active, no palp mass    Rectal:  Deferred  Msk:  Symmetrical without gross deformities. . Neurologic:  Alert and  oriented x4;  grossly normal neurologically. Skin:  Intact without significant lesions or rashes.   Intake/Output from previous day: No intake/output data recorded. Intake/Output this shift: No intake/output data recorded.  Lab Results: Recent Labs    06/29/18 1136 06/30/18 1438  WBC 6.7 7.2  HGB 15.1 14.5  HCT 44.8 43.2  PLT 356 311   BMET Recent  Labs    06/29/18 1136  NA 139  K 3.7  CL 100  CO2 27  GLUCOSE 141*  BUN 10  CREATININE 0.72  CALCIUM 9.6   LFT Recent Labs    06/29/18 1136  PROT 6.7  AST 256*  ALT 807*  BILITOT 6.8*    . CBC Latest Ref Rng & Units 06/30/2018 06/29/2018 05/03/2018  WBC 4.0 - 10.5 K/uL 7.2 6.7 6.9  Hemoglobin 12.0 - 15.0 g/dL 14.5 15.1 14.3  Hematocrit 36.0 - 46.0 % 43.2 44.8 42.2  Platelets 150 - 400 K/uL 311 356 391    . CMP Latest Ref Rng & Units 06/29/2018 05/03/2018 03/24/2018  Glucose 65 - 99 mg/dL 141(H) 104(H) 110(H)  BUN 7 - 25 mg/dL 10 21 -  Creatinine 0.50 - 0.99 mg/dL 0.72 0.82 -  Sodium 135 - 146 mmol/L 139 137 141  Potassium 3.5 - 5.3 mmol/L 3.7 4.1 4.0  Chloride 98 - 110 mmol/L 100 103 -  CO2 20 - 32 mmol/L 27 25 -  Calcium 8.6 - 10.4 mg/dL 9.6 9.7 -  Total Protein 6.1 - 8.1 g/dL 6.7 6.6 -  Total Bilirubin 0.2 - 1.2 mg/dL 6.8(H) 0.5 -  Alkaline Phos 38 - 126 U/L - - -  AST 10 - 35 U/L 256(H) 13 -  ALT 6 - 29 U/L 807(H) 14 -   Studies/Results: Ct Abdomen Pelvis Wo Contrast  Result Date: 06/30/2018 CLINICAL DATA:  Abdominal pain and swelling, decreased appetite. Abnormal lab work. EXAM: CT ABDOMEN AND PELVIS WITHOUT CONTRAST TECHNIQUE: Multidetector CT imaging of the abdomen and pelvis was performed following the standard protocol without IV contrast. COMPARISON:  CT abdomen dated 02/27/2018. FINDINGS: Lower chest: No acute abnormality. Hepatobiliary: New common bile duct dilatation and intrahepatic bile duct dilatation. CBD measures 1.7 cm diameter. No focal liver abnormality. Status post cholecystectomy. Pancreas: New fullness of the pancreatic head. No pancreatic ductal dilatation seen. Spleen: Normal in size without focal abnormality. Adrenals/Urinary Tract: Adrenal glands appear normal. Kidneys are unremarkable without mass, stone or hydronephrosis. No ureteral or bladder calculi identified. Bladder is decompressed. Stomach/Bowel: No dilated large or small bowel loops. No  evidence of bowel wall inflammation. Status post appendectomy. Lap band appears stable in position/configuration without evidence of complicating feature. Stomach otherwise unremarkable. Vascular/Lymphatic: No  significant vascular findings are present. No enlarged abdominal or pelvic lymph nodes. Reproductive: Uterus and bilateral adnexa are unremarkable. Other: No free fluid or abscess collection seen. No free intraperitoneal air. Musculoskeletal: No acute or suspicious osseous finding. Mild degenerative spondylosis of the lumbar spine. IMPRESSION: 1. New common bile duct dilatation and intrahepatic bile duct dilatation. New fullness of the pancreatic head which could represent rapidly developing obstructive neoplastic process or sequela of recent localized pancreatitis. Recommend further characterization with MRCP or ERCP. 2. No other acute or significant findings. No bowel obstruction or evidence of bowel wall inflammation. No free fluid or abscess collection. No free intraperitoneal air. 3. Additional chronic/incidental findings detailed above. These results will be called to the ordering clinician or representative by the Radiologist Assistant, and communication documented in the PACS or zVision Dashboard. Electronically Signed   By: Franki Cabot M.D.   On: 06/30/2018 11:23    Tye Savoy, NP-C @  06/30/2018, 3:04 PM

## 2018-06-30 NOTE — ED Notes (Signed)
ED TO INPATIENT HANDOFF REPORT  Name/Age/Gender Taylor Oconnor 68 y.o. female  Code Status Code Status History    Date Active Date Inactive Code Status Order ID Comments User Context   08/06/2012 1946 08/07/2012 1624 Full Code [id number]  Earnstine Regal, MD Inpatient   08/06/2012 1043 08/06/2012 1946 Full Code [id number]  Earnstine Regal, MD ED   Advance Care Planning Activity      Home/SNF/Other Home  Chief Complaint abd pain   Level of Care/Admitting Diagnosis ED Disposition    ED Disposition Condition [facility]: [facility] [[id number]]  Level of Care: Med-Surg [16]  Covid Evaluation: Screening Protocol (No Symptoms)  Diagnosis: Elevated liver enzymes [284132]  Admitting Physician: Shelly Coss [[id number]]  Attending Physician: Shelly Coss [[id number]]  PT Class (Do Not Modify): Observation [104]  PT Acc Code (Do Not Modify): Observation [10022]       Medical History Past Medical History:  Diagnosis Date  . Allergy    SEASONAL  . Anxiety   . At risk for difficult insertion of breathing tube   . Breast cancer ([place])   . Breast cancer of upper-outer quadrant of right female breast ([place]) 12/03/2014  . 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 GERD (gastroesophageal reflux disease)    hx - resolved with lap band surgery  . H/O hiatal hernia    resolved with band surgery  . H/O laparoscopic adjustable gastric banding 2010  . Hyperlipidemia   . Hypertension   . Insomnia    tx with zanax  . Personal history of radiation therapy   . 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 Type II or unspecified type diabetes mellitus without mention of complication, not stated as uncontrolled 12/14/2012   no meds, diet controlled    Allergies Allergies  Allergen Reactions  . Codeine Itching  . Gabapentin Other (See Comments)    Reaction unknown  . Lipitor [Atorvastatin] Other (See Comments)    Severe fatigue. ? memory loss.  .  Ace Inhibitors Cough    IV Location/Drains/Wounds Patient Lines/Drains/Airways Status   Active Line/Drains/Airways    Name:   Placement date:   Placement time:   Site:   Days:   Peripheral IV 06/30/18 Left Antecubital   06/30/18    1439    Antecubital   less than 1   Ureteral Drain/Stent Right ureter 6 Fr.   03/24/18    1356    Right ureter   98   Incision 08/06/12 N/A Other (Comment)   08/06/12    1718     2154   Incision (Closed) 12/28/14 Breast Right   12/28/14    1404     1280   Incision (Closed) 03/24/18 Perineum Other (Comment)   03/24/18    1350     98   Incision - 3 Ports Abdomen 1: Umbilicus 2: Upper;Right 3: Lower;Left   08/06/12    -     2154          Labs/Imaging Results for orders placed or performed during the hospital encounter of 06/30/18 (from the past 48 hour(s))  SARS Coronavirus 2 (CEPHEID - Performed in [facility] hospital lab), Hosp Order     Status: None   Collection Time: 06/30/18  2:29 PM   Specimen: Nasopharyngeal Swab  Result Value Ref Range   SARS Coronavirus 2 NEGATIVE NEGATIVE    Comment: (NOTE) If result is NEGATIVE SARS-CoV-2 target nucleic  acids are NOT DETECTED. The SARS-CoV-2 RNA is generally detectable in upper and lower  respiratory specimens during the acute phase of infection. The lowest  concentration of SARS-CoV-2 viral copies this assay can detect is 250  copies / mL. A negative result does not preclude SARS-CoV-2 infection  and should not be used as the sole basis for treatment or other  patient management decisions.  A negative result may occur with  improper specimen collection / handling, submission of specimen other  than nasopharyngeal swab, presence of viral mutation(s) within the  areas targeted by this assay, and inadequate number of viral copies  (<250 copies / mL). A negative result must be combined with clinical  observations, patient history, and epidemiological information. If result is POSITIVE SARS-CoV-2 target nucleic  acids are DETECTED. The SARS-CoV-2 RNA is generally detectable in upper and lower  respiratory specimens dur ing the acute phase of infection.  Positive  results are indicative of active infection with SARS-CoV-2.  Clinical  correlation with patient history and other diagnostic information is  necessary to determine patient infection status.  Positive results do  not rule out bacterial infection or co-infection with other viruses. If result is PRESUMPTIVE POSTIVE SARS-CoV-2 nucleic acids MAY BE PRESENT.   A presumptive positive result was obtained on the submitted specimen  and confirmed on repeat testing.  While 2019 novel coronavirus  (SARS-CoV-2) nucleic acids may be present in the submitted sample  additional confirmatory testing may be necessary for epidemiological  and / or clinical management purposes  to differentiate between  SARS-CoV-2 and other Sarbecovirus currently known to infect humans.  If clinically indicated additional testing with an alternate test  methodology [phone]) is advised. The SARS-CoV-2 RNA is generally  detectable in upper and lower respiratory sp ecimens during the acute  phase of infection. The expected result is Negative. Fact Sheet for Patients:  StrictlyIdeas.no Fact Sheet for Healthcare Providers: BankingDealers.co.za This test is not yet approved or cleared by the [place] FDA and has been authorized for detection and/or diagnosis of SARS-CoV-2 by FDA under an Emergency Use Authorization (EUA).  This EUA will remain in effect (meaning this test can be used) for the duration of the COVID-19 declaration under Section 564(b)(1) of the Act, 21 U.S.C. section 360bbb-3(b)(1), unless the authorization is terminated or revoked sooner. Performed at [facility], [place] [address] [place], [place] [address]   Lipase, blood     Status: Abnormal   Collection Time: 06/30/18  2:38 PM   Result Value Ref Range   Lipase 76 (H) 11 - 51 U/L    Comment: Performed at [facility], [place] [address], [place], [place] [address]  Comprehensive metabolic panel     Status: Abnormal   Collection Time: 06/30/18  2:38 PM  Result Value Ref Range   Sodium 139 135 - 145 mmol/L   Potassium 3.4 (L) 3.5 - 5.1 mmol/L   Chloride 103 98 - 111 mmol/L   CO2 24 22 - 32 mmol/L   Glucose, Bld 177 (H) 70 - 99 mg/dL   BUN 16 8 - 23 mg/dL   Creatinine, Ser 0.66 0.44 - 1.00 mg/dL   Calcium 9.1 8.9 - 10.3 mg/dL   Total Protein 7.5 6.5 - 8.1 g/dL   Albumin 4.0 3.5 - 5.0 g/dL   AST 193 (H) 15 - 41 U/L   ALT 591 (H) 0 - 44 U/L   Alkaline Phosphatase 368 (H) 38 - 126 U/L  Total Bilirubin 8.0 (H) 0.3 - 1.2 mg/dL   GFR calc non Af Amer >60 >60 mL/min   GFR calc Af Amer >60 >60 mL/min   Anion gap 12 5 - 15    Comment: Performed at [facility], [place] [address], [place], [place] [address]  CBC     Status: None   Collection Time: 06/30/18  2:38 PM  Result Value Ref Range   WBC 7.2 4.0 - 10.5 K/uL   RBC 4.59 3.87 - 5.11 MIL/uL   Hemoglobin 14.5 12.0 - 15.0 g/dL   HCT 43.2 36.0 - 46.0 %   MCV 94.1 80.0 - 100.0 fL   MCH 31.6 26.0 - 34.0 pg   MCHC 33.6 30.0 - 36.0 g/dL   RDW 13.6 11.5 - 15.5 %   Platelets 311 150 - 400 K/uL   nRBC 0.0 0.0 - 0.2 %    Comment: Performed at [facility], [place] [address] [place], [place] [address]  Urinalysis, Routine w reflex microscopic     Status: Abnormal   Collection Time: 06/30/18  3:53 PM  Result Value Ref Range   Color, Urine AMBER (A) YELLOW    Comment: BIOCHEMICALS MAY BE AFFECTED BY COLOR   APPearance CLEAR CLEAR   Specific Gravity, Urine 1.024 1.005 - 1.030   pH 5.0 5.0 - 8.0   Glucose, UA NEGATIVE NEGATIVE mg/dL   Hgb urine dipstick LARGE (A) NEGATIVE   Bilirubin Urine MODERATE (A) NEGATIVE   Ketones, ur 5 (A) NEGATIVE mg/dL   Protein, ur NEGATIVE NEGATIVE mg/dL   Nitrite NEGATIVE NEGATIVE    Leukocytes,Ua NEGATIVE NEGATIVE   RBC / HPF 0-5 0 - 5 RBC/hpf   Bacteria, UA NONE SEEN NONE SEEN   Squamous Epithelial / LPF 0-5 0 - 5   Mucus PRESENT     Comment: Performed at [facility], [place] [address] [place], [place] [address]   Ct Abdomen Pelvis Wo Contrast  Result Date: 06/30/2018 CLINICAL DATA:  Abdominal pain and swelling, decreased appetite. Abnormal lab work. EXAM: CT ABDOMEN AND PELVIS WITHOUT CONTRAST TECHNIQUE: Multidetector CT imaging of the abdomen and pelvis was performed following the standard protocol without IV contrast. COMPARISON:  CT abdomen dated 02/27/2018. FINDINGS: Lower chest: No acute abnormality. Hepatobiliary: New common bile duct dilatation and intrahepatic bile duct dilatation. CBD measures 1.7 cm diameter. No focal liver abnormality. Status post cholecystectomy. Pancreas: New fullness of the pancreatic head. No pancreatic ductal dilatation seen. Spleen: Normal in size without focal abnormality. Adrenals/Urinary Tract: Adrenal glands appear normal. Kidneys are unremarkable without mass, stone or hydronephrosis. No ureteral or bladder calculi identified. Bladder is decompressed. Stomach/Bowel: No dilated large or small bowel loops. No evidence of bowel wall inflammation. Status post appendectomy. Lap band appears stable in position/configuration without evidence of complicating feature. Stomach otherwise unremarkable. Vascular/Lymphatic: No significant vascular findings are present. No enlarged abdominal or pelvic lymph nodes. Reproductive: Uterus and bilateral adnexa are unremarkable. Other: No free fluid or abscess collection seen. No free intraperitoneal air. Musculoskeletal: No acute or suspicious osseous finding. Mild degenerative spondylosis of the lumbar spine. IMPRESSION: 1. New common bile duct dilatation and intrahepatic bile duct dilatation. New fullness of the pancreatic head which could represent rapidly developing obstructive neoplastic  process or sequela of recent localized pancreatitis. Recommend further characterization with MRCP or ERCP. 2. No other acute or significant findings. No bowel obstruction or evidence of bowel wall inflammation. No free fluid or abscess collection. No free intraperitoneal air.  3. Additional chronic/incidental findings detailed above. These results will be called to the ordering clinician or representative by the Radiologist Assistant, and communication documented in the PACS or zVision Dashboard. Electronically Signed   By: Franki Cabot M.D.   On: 06/30/2018 11:23    Pending Labs [employer] (From admission, onward)    Start     Ordered   Signed and Held  Novel Coronavirus, NAA (hospital order; send-out to ref lab)  Once,   R    Comments: No isolation needed for this testing (if isolation ordered for another indication, maintain current isolation).   Question:  Pre-procedural testing  Answer:  Yes   Signed and Held          Vitals/Pain Today's Vitals   06/30/18 1436 06/30/18 1500 06/30/18 1530 06/30/18 1600  BP: (!) 156/88 (!) 157/90 (!) 163/76 (!) 165/85  Pulse: 73 71 64 60  Resp: 18 16 18 16   Temp:      TempSrc:      SpO2: 97% 98% 98% 99%  Weight:      Height:      PainSc:        Isolation Precautions No active isolations  Medications Medications  piperacillin-tazobactam (ZOSYN) IVPB 3.375 g (3.375 g Intravenous New Bag/Given (Non-Interop) 06/30/18 1636)  piperacillin-tazobactam (ZOSYN) IVPB 3.375 g (has no administration in time range)    Mobility walks

## 2018-06-30 NOTE — Telephone Encounter (Signed)
FYI Dr Terri Piedra, PA from Dr Id[place] Pickles office called regarding a pt that you last saw in 2016.  She did a work up on her and found elevated LFT's, increases bili, lipase of 160, and CT scan showed dilated bile duct and some abnormality in the pancreas.  She is sending her to [facility] ED for evaluation. Tye Savoy PA notified

## 2018-06-30 NOTE — ED Provider Notes (Signed)
[facility] DEPT Provider Note   CSN: [id number] Arrival date & time: 06/30/18  1329    History   Chief Complaint Chief Complaint  Patient presents with  . Abdominal Pain  . abnormal CT  . Abnormal Lab    HPI Taylor Oconnor is a 68 y.o. female.     HPI Patient presents with epigastric abdominal pain.  Has had for the last week.  Seen by PCP with elevated LFTs.  Had outpatient CT scan that showed pancreatic head swelling and common bile duct dilatation.  Sent in for admission to hospital and to be seen by gastroenterology.  States she had previously had temperatures up to 100.1.  Previous lap band.  Has had previous cholecystectomy.  No blood in stool.  No diarrhea.  No weight loss. Past Medical History:  Diagnosis Date  . Allergy    SEASONAL  . Anxiety   . At risk for difficult insertion of breathing tube   . Breast cancer ([place])   . Breast cancer of upper-outer quadrant of right female breast ([place]) 12/03/2014  . 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 GERD (gastroesophageal reflux disease)    hx - resolved with lap band surgery  . H/O hiatal hernia    resolved with band surgery  . H/O laparoscopic adjustable gastric banding 2010  . Hyperlipidemia   . Hypertension   . Insomnia    tx with zanax  . Personal history of radiation therapy   . 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 Type II or unspecified type diabetes mellitus without mention of complication, not stated as uncontrolled 12/14/2012   no meds, diet controlled    Patient Active Problem List   Diagnosis Date Noted  . Anxiety 12/01/2016  . Prediabetes 11/30/2016  . Malignant neoplasm of upper-outer quadrant of right breast in female, estrogen receptor positive ([place]) 12/03/2014  . Encounter for general adult medical examination with abnormal findings 12/18/2013  . Essential hypertension 12/14/2012  . Hyperlipidemia, mixed 12/14/2012  . Vitamin D deficiency  12/14/2012  . Morbid obesity ([place]) 12/14/2012  . Lapband APS + [facility] repair Nov 2008 08/26/2012    Past Surgical History:  Procedure Laterality Date  . APPENDECTOMY  08/06/12   lap appy  . BACK SURGERY     cervical  . BREAST BIOPSY    . BREAST LUMPECTOMY Right 2016  . BREAST LUMPECTOMY WITH NEEDLE LOCALIZATION AND AXILLARY SENTINEL LYMPH NODE BX Right 12/28/2014   Procedure: RIGHT BREAST LUMPECTOMY WITH TWO (2) NEEDLE LOCALIZATION AND AXILLARY SENTINEL LYMPH NODE BX;  Surgeon: Alphonsa Overall, MD;  Location: [place];  Service: General;  Laterality: Right;  . CESAREAN SECTION     x 1  . CHOLECYSTECTOMY    . COLONOSCOPY  09/29/2004   normal  . CYSTOSCOPY WITH RETROGRADE PYELOGRAM, URETEROSCOPY AND STENT PLACEMENT Right 03/24/2018   Procedure: CYSTOSCOPY WITH RETROGRADE PYELOGRAM, URETEROSCOPY AND STENT PLACEMENT;  Surgeon: Franchot Gallo, MD;  Location: [facility];  Service: Urology;  Laterality: Right;  1 HR  . DIAGNOSTIC LAPAROSCOPY    . HERNIA REPAIR  2008  . LAPAROSCOPIC APPENDECTOMY N/A 08/06/2012   Procedure: APPENDECTOMY LAPAROSCOPIC;  Surgeon: Earnstine Regal, MD;  Location: [facility];  Service: General;  Laterality: N/A;  . LAPAROSCOPIC GASTRIC BANDING WITH HIATAL HERNIA REPAIR N/A Nov 2008  . TUBAL LIGATION       OB History   No obstetric history on file.  Obstetric Comments  GYNECOLOGIC HISTORY:  No LMP recorded. Patient is postmenopausal. Menarche age 37, first live birth age 40. The patient is GX P1. She stopped having periods in 2002. She did not take hormone replacement. She used oral contraceptives for 11 years remot ely, with no complications.          Home Medications    Prior to Admission medications   Medication Sig Start Date End Date Taking? Authorizing Provider  acidophilus (RISAQUAD) CAPS Take 1 capsule by mouth daily after supper. Reported on 02/19/2015   Yes [provider]  anastrozole (ARIMIDEX) 1 MG tablet  TAKE 1 TABLET BY MOUTH EVERY DAY Patient taking differently: Take 1 mg by mouth daily.  03/21/18  Yes Causey, Charlestine Massed, NP  aspirin EC 81 MG tablet Take 81 mg by mouth daily.   Yes [provider]  atenolol (TENORMIN) 100 MG tablet TAKE 1 TABLET BY MOUTH EVERY DAY Patient taking differently: Take 50 mg by mouth daily after supper.  03/19/18  Yes Unk Pinto, MD  b complex vitamins capsule Take 1 capsule by mouth daily after supper.    Yes [provider]  BIOTIN PO Take 1 capsule by mouth daily after supper.   Yes [provider]  calcium carbonate (TUMS) 500 MG chewable tablet Chew 1,000 mg by mouth daily after supper.    Yes [provider]  Cholecalciferol (VITAMIN D3) 5000 UNITS CAPS Take 5,000 Units by mouth daily after supper.    Yes [provider]  Cinnamon 500 MG TABS Take 500 mg by mouth daily after supper.    Yes [provider]  Magnesium 250 MG TABS Take 250 mg by mouth 2 (two) times daily.   Yes [provider]  Multiple Vitamins-Minerals (MULTIVITAMIN WITH MINERALS) tablet Take 1 tablet by mouth 2 (two) times daily.   Yes [provider]  Omega-3 Fatty Acids (FISH OIL) 1000 MG CAPS Take 1,000 mg by mouth daily after supper.    Yes [provider]  omeprazole (PRILOSEC) 20 MG capsule Take 1 capsule (20 mg total) by mouth 2 (two) times daily before a meal for 14 days. 14 days 06/29/18 07/13/18 Yes Liane Comber, NP  ondansetron (ZOFRAN) 4 MG tablet Take 1 tablet (4 mg total) by mouth every 8 (eight) hours as needed for nausea or vomiting. 02/27/18  Yes Rolland Porter, MD  OVER THE COUNTER MEDICATION Take 1 tablet by mouth 2 (two) times daily. Tumeric with black pepper   Yes [provider]  promethazine (PHENERGAN) 25 MG tablet Take 1/2-1 tab every 6 hours as needed for nausea. Patient taking differently: Take 12.5-25 mg by mouth every 6 (six) hours as needed for nausea.  06/29/18  Yes Liane Comber, NP  rosuvastatin (CRESTOR) 40 MG tablet Take 1 tablet daily for Cholesterol Patient taking differently: Take 40 mg by mouth daily.  03/19/18  Yes Unk Pinto, MD  traZODone (DESYREL) 150 MG tablet Take 1/2 to 1 tablet 1 to 2 hours before Bedtime Patient taking differently: Take 150 mg by mouth.  06/01/18  Yes Unk Pinto, MD  verapamil (CALAN) 80 MG tablet Take 1 tablet 2 x /day with meal for BP Patient taking differently: Take 80 mg by mouth 2 (two) times daily with a meal.  02/14/18  Yes Unk Pinto, MD  Zinc 50 MG TABS Take 50 mg by mouth daily after supper.   Yes [provider]    Family History Family History  Problem Relation  Age of Onset  . Cirrhosis Mother   . Alcohol abuse Mother   . Liver disease Mother   . Diabetes Father   . Hypertension Father   . Stroke Father   . Heart disease Father   . AAA (abdominal aortic aneurysm) Sister   . Cancer Cousin        Breast  . Breast cancer Cousin        x3  . Colon cancer Neg Hx   . Rectal cancer Neg Hx   . Stomach cancer Neg Hx     Social History Social History   Tobacco Use  . Smoking status: Never Smoker  . Smokeless tobacco: Never Used  Substance Use Topics  . Alcohol use: No    Alcohol/week: 0.0 standard drinks  . Drug use: No     Allergies   Codeine, Gabapentin, Lipitor [atorvastatin], and Ace inhibitors   Review of Systems Review of Systems  Constitutional: Negative for appetite change.  Respiratory: Negative for shortness of breath.   Gastrointestinal: Positive for abdominal pain, nausea and vomiting.  Genitourinary: Negative for flank pain.  Musculoskeletal: Negative for back pain.  Skin: Negative for rash.  Neurological: Negative for weakness.  Psychiatric/Behavioral: Negative for confusion.     Physical Exam Updated Vital Signs BP (!) 156/88 (BP Location: Left Arm)   Pulse 73   Temp 98.3 F (36.8 C) (Oral)   Resp 18   Ht 5' 3.5" (1.613 m)   Wt 85.3 kg   SpO2  97%   BMI 32.78 kg/m   Physical Exam Vitals signs and nursing note reviewed.  HENT:     Head: Normocephalic.  Eyes:     Pupils: Pupils are equal, round, and reactive to light.  Cardiovascular:     Rate and Rhythm: Regular rhythm.  Abdominal:     Palpations: There is no mass.     Hernia: No hernia is present.     Comments: Mild epigastric tenderness without rebound or guarding.  Skin:    General: Skin is warm.     Capillary Refill: Capillary refill takes less than 2 seconds.  Neurological:     General: No focal deficit present.     Mental Status: She is alert.      ED Treatments / Results  Labs (all labs ordered are listed, but only abnormal results are displayed) Labs Reviewed  SARS CORONAVIRUS 2 (HOSPITAL ORDER, [place])  CBC  LIPASE, BLOOD  COMPREHENSIVE METABOLIC PANEL  URINALYSIS, ROUTINE W REFLEX MICROSCOPIC    EKG None  Radiology Ct Abdomen Pelvis Wo Contrast  Result Date: 06/30/2018 CLINICAL DATA:  Abdominal pain and swelling, decreased appetite. Abnormal lab work. EXAM: CT ABDOMEN AND PELVIS WITHOUT CONTRAST TECHNIQUE: Multidetector CT imaging of the abdomen and pelvis was performed following the standard protocol without IV contrast. COMPARISON:  CT abdomen dated 02/27/2018. FINDINGS: Lower chest: No acute abnormality. Hepatobiliary: New common bile duct dilatation and intrahepatic bile duct dilatation. CBD measures 1.7 cm diameter. No focal liver abnormality. Status post cholecystectomy. Pancreas: New fullness of the pancreatic head. No pancreatic ductal dilatation seen. Spleen: Normal in size without focal abnormality. Adrenals/Urinary Tract: Adrenal glands appear normal. Kidneys are unremarkable without mass, stone or hydronephrosis. No ureteral or bladder calculi identified. Bladder is decompressed. Stomach/Bowel: No dilated large or small bowel loops. No evidence of bowel wall inflammation. Status post appendectomy. Lap band  appears stable in position/configuration without evidence of complicating feature. Stomach otherwise unremarkable. Vascular/Lymphatic: No significant vascular  findings are present. No enlarged abdominal or pelvic lymph nodes. Reproductive: Uterus and bilateral adnexa are unremarkable. Other: No free fluid or abscess collection seen. No free intraperitoneal air. Musculoskeletal: No acute or suspicious osseous finding. Mild degenerative spondylosis of the lumbar spine. IMPRESSION: 1. New common bile duct dilatation and intrahepatic bile duct dilatation. New fullness of the pancreatic head which could represent rapidly developing obstructive neoplastic process or sequela of recent localized pancreatitis. Recommend further characterization with MRCP or ERCP. 2. No other acute or significant findings. No bowel obstruction or evidence of bowel wall inflammation. No free fluid or abscess collection. No free intraperitoneal air. 3. Additional chronic/incidental findings detailed above. These results will be called to the ordering clinician or representative by the Radiologist Assistant, and communication documented in the PACS or zVision Dashboard. Electronically Signed   By: Franki Cabot M.D.   On: 06/30/2018 11:23    Procedures Procedures (including critical care time)  Medications Ordered in ED Medications - No data to display   Initial Impression / Assessment and Plan / ED Course  I have reviewed the triage vital signs and the nursing notes.  Pertinent labs & imaging results that were available during my care of the patient were reviewed by me and considered in my medical decision making (see chart for details).        Patient with epigastric abdominal pain.  Elevated LFTs.  Previous cholecystectomy.  Outpatient CT scan done.  Transferred to ER for admission.  And to be seen by lower GI, who I have contacted.  Repeat lab work has been ordered.  No current fever.  White count normal yesterday.  Doubt  an ascending cholangitis. Final Clinical Impressions(s) / ED Diagnoses   Final diagnoses:  Common bile duct (CBD) obstruction    ED Discharge Orders    None       Davonna Belling, MD 06/30/18 1502

## 2018-06-30 NOTE — H&P (Signed)
History and Physical    Taylor Oconnor VOH:[id number] DOB: 1950-12-20 DOA: 06/30/2018  PCP: Unk Pinto, MD   Patient coming from: Home  Chief Complaint: Abdominal pain  HPI: Patient is a 68 year old female with history of hypertension, hyperlipidemia, breast cancer who presents to the emergency department after her PCP sent her here for the evaluation of elevated liver enzymes.  Patient reported that, since last Saturday she has developed epigastric pain which is radiating to the back.  The pain was getting worse.  She had no appetite.  She also developed mild nausea but denies any vomiting or diarrhea.  Patient has not had a bowel movement since last Sunday.  She has no appetite.  Also reported that she had low-grade fever of 99-100 night at home.  She was seen by her PCP and found to have elevated liver function test.  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 Patient seen and examined at the bedside in the emergency department.  Currently she is comfortable.  Hemodynamically stable though she is mildly hypertensive. Denied any cough, chest pain, shortness of breath, vomiting, diarrhea, headache. She has history of right breast cancer status post lumpectomy and follows with Dr. [name].  ED Course: Found to have elevated liver enzymes.  Found to have elevated lipase.  CT report as below.  Review of Systems: As per HPI otherwise 10 point review of systems negative.    Past Medical History:  Diagnosis Date  . Allergy    SEASONAL  . Anxiety   . At risk for difficult insertion of breathing tube   . Breast cancer ([place])   . Breast cancer of upper-outer quadrant of right female breast ([place]) 12/03/2014  . 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 GERD (gastroesophageal reflux disease)    hx - resolved with lap band surgery  . H/O hiatal hernia    resolved with band surgery  . H/O laparoscopic adjustable gastric  banding 2010  . Hyperlipidemia   . Hypertension   . Insomnia    tx with zanax  . Personal history of radiation therapy   . 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 Type II or unspecified type diabetes mellitus without mention of complication, not stated as uncontrolled 12/14/2012   no meds, diet controlled    Past Surgical History:  Procedure Laterality Date  . APPENDECTOMY  08/06/12   lap appy  . BACK SURGERY     cervical  . BREAST BIOPSY    . BREAST LUMPECTOMY Right 2016  . BREAST LUMPECTOMY WITH NEEDLE LOCALIZATION AND AXILLARY SENTINEL LYMPH NODE BX Right 12/28/2014   Procedure: RIGHT BREAST LUMPECTOMY WITH TWO (2) NEEDLE LOCALIZATION AND AXILLARY SENTINEL LYMPH NODE BX;  Surgeon: Alphonsa Overall, MD;  Location: [facility];  Service: General;  Laterality: Right;  . CESAREAN SECTION     x 1  . CHOLECYSTECTOMY    . COLONOSCOPY  09/29/2004   normal  . CYSTOSCOPY WITH RETROGRADE PYELOGRAM, URETEROSCOPY AND STENT PLACEMENT Right 03/24/2018   Procedure: CYSTOSCOPY WITH RETROGRADE PYELOGRAM, URETEROSCOPY AND STENT PLACEMENT;  Surgeon: Franchot Gallo, MD;  Location: [facility];  Service: Urology;  Laterality: Right;  1 HR  . DIAGNOSTIC LAPAROSCOPY    . HERNIA REPAIR  2008  . LAPAROSCOPIC APPENDECTOMY N/A 08/06/2012   Procedure: APPENDECTOMY LAPAROSCOPIC;  Surgeon: Earnstine Regal, MD;  Location: [facility];  Service: General;  Laterality: N/A;  .  LAPAROSCOPIC GASTRIC BANDING WITH HIATAL HERNIA REPAIR N/A Nov 2008  . TUBAL LIGATION       reports that she has never smoked. She has never used smokeless tobacco. She reports that she does not drink alcohol or use drugs.  Allergies  Allergen Reactions  . Codeine Itching  . Gabapentin Other (See Comments)    Reaction unknown  . Lipitor [Atorvastatin] Other (See Comments)    Severe fatigue. ? memory loss.  . Ace Inhibitors Cough    Family History  Problem Relation Age of Onset  . Cirrhosis Mother    . Alcohol abuse Mother   . Liver disease Mother   . Diabetes Father   . Hypertension Father   . Stroke Father   . Heart disease Father   . AAA (abdominal aortic aneurysm) Sister   . Cancer Cousin        Breast  . Breast cancer Cousin        x3  . Colon cancer Neg Hx   . Rectal cancer Neg Hx   . Stomach cancer Neg Hx      Prior to Admission medications   Medication Sig Start Date End Date Taking? Authorizing Provider  acidophilus (RISAQUAD) CAPS Take 1 capsule by mouth daily after supper. Reported on 02/19/2015   Yes [provider]  anastrozole (ARIMIDEX) 1 MG tablet TAKE 1 TABLET BY MOUTH EVERY DAY Patient taking differently: Take 1 mg by mouth daily.  03/21/18  Yes Causey, Charlestine Massed, NP  aspirin EC 81 MG tablet Take 81 mg by mouth daily.   Yes [provider]  atenolol (TENORMIN) 100 MG tablet TAKE 1 TABLET BY MOUTH EVERY DAY Patient taking differently: Take 50 mg by mouth daily after supper.  03/19/18  Yes Unk Pinto, MD  b complex vitamins capsule Take 1 capsule by mouth daily after supper.    Yes [provider]  BIOTIN PO Take 1 capsule by mouth daily after supper.   Yes [provider]  calcium carbonate (TUMS) 500 MG chewable tablet Chew 1,000 mg by mouth daily after supper.    Yes [provider]  Cholecalciferol (VITAMIN D3) 5000 UNITS CAPS Take 5,000 Units by mouth daily after supper.    Yes [provider]  Cinnamon 500 MG TABS Take 500 mg by mouth daily after supper.    Yes [provider]  Magnesium 250 MG TABS Take 250 mg by mouth 2 (two) times daily.   Yes [provider]  Multiple Vitamins-Minerals (MULTIVITAMIN WITH MINERALS) tablet Take 1 tablet by mouth 2 (two) times daily.   Yes [provider]  Omega-3 Fatty Acids (FISH OIL) 1000 MG CAPS Take 1,000 mg by mouth daily after supper.    Yes [provider]  omeprazole (PRILOSEC) 20 MG capsule Take 1 capsule (20 mg  total) by mouth 2 (two) times daily before a meal for 14 days. 14 days 06/29/18 07/13/18 Yes Liane Comber, NP  ondansetron (ZOFRAN) 4 MG tablet Take 1 tablet (4 mg total) by mouth every 8 (eight) hours as needed for nausea or vomiting. 02/27/18  Yes Rolland Porter, MD  OVER THE COUNTER MEDICATION Take 1 tablet by mouth 2 (two) times daily. Tumeric with black pepper   Yes [provider]  promethazine (PHENERGAN) 25 MG tablet Take 1/2-1 tab every 6 hours as needed for nausea. Patient taking differently: Take 12.5-25 mg by mouth every 6 (six) hours as needed for nausea.  06/29/18  Yes Liane Comber, NP  rosuvastatin (CRESTOR) 40 MG tablet Take 1 tablet daily for Cholesterol Patient taking differently: Take 40 mg by mouth daily.  03/19/18  Yes Unk Pinto, MD  traZODone (DESYREL) 150 MG tablet Take 1/2 to 1 tablet 1 to 2 hours before Bedtime Patient taking differently: Take 150 mg by mouth.  06/01/18  Yes Unk Pinto, MD  verapamil (CALAN) 80 MG tablet Take 1 tablet 2 x /day with meal for BP Patient taking differently: Take 80 mg by mouth 2 (two) times daily with a meal.  02/14/18  Yes Unk Pinto, MD  Zinc 50 MG TABS Take 50 mg by mouth daily after supper.   Yes [provider]    Physical Exam: Vitals:   06/30/18 1436 06/30/18 1500 06/30/18 1530 06/30/18 1600  BP: (!) 156/88 (!) 157/90 (!) 163/76 (!) 165/85  Pulse: 73 71 64 60  Resp: 18 16 18 16   Temp:      TempSrc:      SpO2: 97% 98% 98% 99%  Weight:      Height:        Constitutional: Not in distress, obese Vitals:   06/30/18 1436 06/30/18 1500 06/30/18 1530 06/30/18 1600  BP: (!) 156/88 (!) 157/90 (!) 163/76 (!) 165/85  Pulse: 73 71 64 60  Resp: 18 16 18 16   Temp:      TempSrc:      SpO2: 97% 98% 98% 99%  Weight:      Height:       Eyes: PERRL, lids and conjunctivae normal ENMT: Mucous membranes are moist. Posterior pharynx clear of any exudate or lesions.Normal dentition.  Neck: normal, supple, no  masses, no thyromegaly Respiratory: clear to auscultation bilaterally, no wheezing, no crackles. Normal respiratory effort. No accessory muscle use.  Cardiovascular: Regular rate and rhythm, no murmurs / rubs / gallops. No extremity edema. 2+ pedal pulses. No carotid bruits.  Abdomen: no tenderness, no masses palpated. No hepatosplenomegaly. Bowel sounds positive.  Musculoskeletal: no clubbing / cyanosis. No joint deformity upper and lower extremities. Good ROM, no contractures. Normal muscle tone.  Skin: no rashes, lesions, ulcers. No induration Neurologic: CN 2-12 grossly intact. Sensation intact, DTR normal. Strength 5/5 in all 4.  Psychiatric: Normal judgment and insight. Alert and oriented x 3. Normal mood.   Foley Catheter:None  Labs on Admission: I have personally reviewed following labs and imaging studies  CBC: Recent Labs  Lab 06/29/18 1136 06/30/18 1438  WBC 6.7 7.2  NEUTROABS 4,657  --   HGB 15.1 14.5  HCT 44.8 43.2  MCV 92.2 94.1  PLT 356 914   Basic Metabolic Panel: Recent Labs  Lab 06/29/18 1136 06/30/18 1438  NA 139 139  K 3.7 3.4*  CL 100 103  CO2 27 24  GLUCOSE 141* 177*  BUN 10 16  CREATININE 0.72 0.66  CALCIUM 9.6 9.1   GFR: Estimated Creatinine Clearance: 72.4 mL/min (by C-G formula based on SCr of 0.66 mg/dL). Liver Function Tests: Recent Labs  Lab 06/29/18 1136 06/30/18 1438  AST 256* 193*  ALT 807* 591*  ALKPHOS  --  368*  BILITOT 6.8* 8.0*  PROT 6.7 7.5  ALBUMIN  --  4.0   Recent Labs  Lab 06/29/18 1136 06/30/18 1438  LIPASE 140* 76*  AMYLASE 39  --    No results for input(s): AMMONIA in the last 168 hours. Coagulation Profile: No results for input(s): INR, PROTIME in the last 168 hours. Cardiac Enzymes: No results for input(s): CKTOTAL, CKMB, CKMBINDEX, TROPONINI in  the last 168 hours. BNP (last 3 results) No results for input(s): PROBNP in the last 8760 hours. HbA1C: No results for input(s): HGBA1C in the last 72 hours.  CBG: No results for input(s): GLUCAP in the last 168 hours. Lipid Profile: No results for input(s): CHOL, HDL, LDLCALC, TRIG, CHOLHDL, LDLDIRECT in the last 72 hours. Thyroid Function Tests: No results for input(s): TSH, T4TOTAL, FREET4, T3FREE, THYROIDAB in the last 72 hours. Anemia Panel: No results for input(s): VITAMINB12, FOLATE, FERRITIN, TIBC, IRON, RETICCTPCT in the last 72 hours. Urine analysis:    Component Value Date/Time   COLORURINE AMBER (A) 06/30/2018 1553   APPEARANCEUR CLEAR 06/30/2018 1553   LABSPEC 1.024 06/30/2018 1553   PHURINE 5.0 06/30/2018 1553   GLUCOSEU NEGATIVE 06/30/2018 1553   HGBUR LARGE (A) 06/30/2018 1553   BILIRUBINUR MODERATE (A) 06/30/2018 1553   KETONESUR 5 (A) 06/30/2018 1553   PROTEINUR NEGATIVE 06/30/2018 1553   UROBILINOGEN 0.2 05/30/2013 0903   NITRITE NEGATIVE 06/30/2018 1553   LEUKOCYTESUR NEGATIVE 06/30/2018 1553    Radiological Exams on Admission: Ct Abdomen Pelvis Wo Contrast  Result Date: 06/30/2018 CLINICAL DATA:  Abdominal pain and swelling, decreased appetite. Abnormal lab work. EXAM: CT ABDOMEN AND PELVIS WITHOUT CONTRAST TECHNIQUE: Multidetector CT imaging of the abdomen and pelvis was performed following the standard protocol without IV contrast. COMPARISON:  CT abdomen dated 02/27/2018. FINDINGS: Lower chest: No acute abnormality. Hepatobiliary: New common bile duct dilatation and intrahepatic bile duct dilatation. CBD measures 1.7 cm diameter. No focal liver abnormality. Status post cholecystectomy. Pancreas: New fullness of the pancreatic head. No pancreatic ductal dilatation seen. Spleen: Normal in size without focal abnormality. Adrenals/Urinary Tract: Adrenal glands appear normal. Kidneys are unremarkable without mass, stone or hydronephrosis. No ureteral or bladder calculi identified. Bladder is decompressed. Stomach/Bowel: No dilated large or small bowel loops. No evidence of bowel wall inflammation. Status post appendectomy.  Lap band appears stable in position/configuration without evidence of complicating feature. Stomach otherwise unremarkable. Vascular/Lymphatic: No significant vascular findings are present. No enlarged abdominal or pelvic lymph nodes. Reproductive: Uterus and bilateral adnexa are unremarkable. Other: No free fluid or abscess collection seen. No free intraperitoneal air. Musculoskeletal: No acute or suspicious osseous finding. Mild degenerative spondylosis of the lumbar spine. IMPRESSION: 1. New common bile duct dilatation and intrahepatic bile duct dilatation. New fullness of the pancreatic head which could represent rapidly developing obstructive neoplastic process or sequela of recent localized pancreatitis. Recommend further characterization with MRCP or ERCP. 2. No other acute or significant findings. No bowel obstruction or evidence of bowel wall inflammation. No free fluid or abscess collection. No free intraperitoneal air. 3. Additional chronic/incidental findings detailed above. These results will be called to the ordering clinician or representative by the Radiologist Assistant, and communication documented in the PACS or zVision Dashboard. Electronically Signed   By: Franki Cabot M.D.   On: 06/30/2018 11:23     Assessment/Plan Active Problems:   Elevated liver enzymes  Elevated liver enzymes: CT abdomen showed new common bile duct dilatation and intrahepatic bile duct dilatation. New fullness of the pancreatic head which could represent rapidly developing obstructive neoplastic process or sequela of recent localized pancreatitis.  R/O malignancy.  Patient is a past smoker.  Does not give any history of weight loss. GI already consulted.  Plan for ERCP tomorrow. Continue clear liquid diet for today.  N.p.o. after midnight. No evidence of cholangitis as per CT imaging.  But patient was complaining of some fever at home.  So we  started on Zosyn. Follow-up lipase, amylase in the morning.  We will  continue to monitor LFTs.  Hypertension: Mildly hypertensive in the emergency department.  Home medications resumed.  Continue to monitor blood pressure.  Hyperlipidemia: Continue statin  History of breast cancer: Right breast cancer diagnosed on 2016.  Status post lumpectomy.  Currently on anastrozole.  Follows with Dr. [name].  Hypokalemia: Supplemented with potassium.  Obesity: BMI of 32.7  Severity of Illness: The appropriate patient status for this patient is OBSERVATION.    DVT prophylaxis: Lovenox Code Status: Full Family Communication: None present at the bedside Consults called: GI     Shelly Coss MD [facility] Pager [phone]  If 7PM-7AM, please contact night-coverage [email or web address] Password [facility]  06/30/2018, 4:51 PM

## 2018-06-30 NOTE — Telephone Encounter (Signed)
Liane Comber, PA from Dr. Idell Pickles office called regarding an urgent workup on pt.  She requested a call back to discuss.  She thinks pt needs to be seen by Dr. Silverio Decamp today for be seen in the ER.

## 2018-06-30 NOTE — Progress Notes (Signed)
68 y.o. female seen yesterday for epigastric pain, nausea and low grade fever x 5 days; lab workup demonstrated severely elevated LFTs, elevated bilirubin, lipase 160; CBC was normal. Stat CT abdomen was ordered today which demonstrated new common bile duct dilatation and intrahepatic bile duct dilatation. New fullness of the pancreatic head which could represent rapidly developing obstructive neoplastic process or sequela of recent localized pancreatitis. Recommend furthercharacterization with MRCP or ERCP. Spoke with her established GI [facility] office, and per their recommendation patient was advised to present to ED for evaluation by their on call provider there. Patient was called and states she will present to [facility] ED. ED was faxed regarding impending arrival.

## 2018-07-01 ENCOUNTER — Observation Stay (HOSPITAL_COMMUNITY): Payer: Medicare Other | Admitting: Certified Registered"

## 2018-07-01 ENCOUNTER — Encounter (HOSPITAL_COMMUNITY)
Admission: EM | Disposition: A | Discharge status: Home / Self Care | Payer: Self-pay | Source: Home / Self Care | Attending: Internal Medicine

## 2018-07-01 ENCOUNTER — Encounter (HOSPITAL_COMMUNITY): Payer: Self-pay | Admitting: Gastroenterology

## 2018-07-01 ENCOUNTER — Observation Stay (HOSPITAL_COMMUNITY): Payer: Medicare Other

## 2018-07-01 DIAGNOSIS — E782 Mixed hyperlipidemia: Secondary | ICD-10-CM | POA: Diagnosis not present

## 2018-07-01 DIAGNOSIS — K8689 Other specified diseases of pancreas: Secondary | ICD-10-CM | POA: Diagnosis not present

## 2018-07-01 DIAGNOSIS — E876 Hypokalemia: Secondary | ICD-10-CM | POA: Diagnosis present

## 2018-07-01 DIAGNOSIS — Z6832 Body mass index (BMI) 32.0-32.9, adult: Secondary | ICD-10-CM | POA: Diagnosis not present

## 2018-07-01 DIAGNOSIS — K831 Obstruction of bile duct: Secondary | ICD-10-CM

## 2018-07-01 DIAGNOSIS — R1010 Upper abdominal pain, unspecified: Secondary | ICD-10-CM | POA: Diagnosis not present

## 2018-07-01 DIAGNOSIS — Z170 Estrogen receptor positive status [ER+]: Secondary | ICD-10-CM | POA: Diagnosis not present

## 2018-07-01 DIAGNOSIS — Z87891 Personal history of nicotine dependence: Secondary | ICD-10-CM | POA: Diagnosis not present

## 2018-07-01 DIAGNOSIS — R935 Abnormal findings on diagnostic imaging of other abdominal regions, including retroperitoneum: Secondary | ICD-10-CM

## 2018-07-01 DIAGNOSIS — I10 Essential (primary) hypertension: Secondary | ICD-10-CM | POA: Diagnosis not present

## 2018-07-01 DIAGNOSIS — C50411 Malignant neoplasm of upper-outer quadrant of right female breast: Secondary | ICD-10-CM

## 2018-07-01 DIAGNOSIS — Z9049 Acquired absence of other specified parts of digestive tract: Secondary | ICD-10-CM | POA: Diagnosis not present

## 2018-07-01 DIAGNOSIS — R748 Abnormal levels of other serum enzymes: Secondary | ICD-10-CM | POA: Diagnosis not present

## 2018-07-01 DIAGNOSIS — Z87442 Personal history of urinary calculi: Secondary | ICD-10-CM | POA: Diagnosis not present

## 2018-07-01 DIAGNOSIS — R109 Unspecified abdominal pain: Secondary | ICD-10-CM | POA: Diagnosis present

## 2018-07-01 DIAGNOSIS — Z853 Personal history of malignant neoplasm of breast: Secondary | ICD-10-CM | POA: Diagnosis not present

## 2018-07-01 DIAGNOSIS — I129 Hypertensive chronic kidney disease with stage 1 through stage 4 chronic kidney disease, or unspecified chronic kidney disease: Secondary | ICD-10-CM | POA: Diagnosis not present

## 2018-07-01 DIAGNOSIS — Z888 Allergy status to other drugs, medicaments and biological substances status: Secondary | ICD-10-CM | POA: Diagnosis not present

## 2018-07-01 DIAGNOSIS — E669 Obesity, unspecified: Secondary | ICD-10-CM | POA: Diagnosis present

## 2018-07-01 DIAGNOSIS — K219 Gastro-esophageal reflux disease without esophagitis: Secondary | ICD-10-CM | POA: Diagnosis present

## 2018-07-01 DIAGNOSIS — Z79899 Other long term (current) drug therapy: Secondary | ICD-10-CM | POA: Diagnosis not present

## 2018-07-01 DIAGNOSIS — Z885 Allergy status to narcotic agent status: Secondary | ICD-10-CM | POA: Diagnosis not present

## 2018-07-01 DIAGNOSIS — N182 Chronic kidney disease, stage 2 (mild): Secondary | ICD-10-CM | POA: Diagnosis present

## 2018-07-01 DIAGNOSIS — Z20828 Contact with and (suspected) exposure to other viral communicable diseases: Secondary | ICD-10-CM | POA: Diagnosis present

## 2018-07-01 DIAGNOSIS — Z7982 Long term (current) use of aspirin: Secondary | ICD-10-CM | POA: Diagnosis not present

## 2018-07-01 DIAGNOSIS — K838 Other specified diseases of biliary tract: Secondary | ICD-10-CM | POA: Diagnosis not present

## 2018-07-01 DIAGNOSIS — Z9884 Bariatric surgery status: Secondary | ICD-10-CM | POA: Diagnosis not present

## 2018-07-01 DIAGNOSIS — K769 Liver disease, unspecified: Secondary | ICD-10-CM | POA: Diagnosis present

## 2018-07-01 DIAGNOSIS — E559 Vitamin D deficiency, unspecified: Secondary | ICD-10-CM | POA: Diagnosis present

## 2018-07-01 DIAGNOSIS — E1122 Type 2 diabetes mellitus with diabetic chronic kidney disease: Secondary | ICD-10-CM | POA: Diagnosis present

## 2018-07-01 DIAGNOSIS — Z923 Personal history of irradiation: Secondary | ICD-10-CM | POA: Diagnosis not present

## 2018-07-01 DIAGNOSIS — R17 Unspecified jaundice: Secondary | ICD-10-CM | POA: Diagnosis not present

## 2018-07-01 DIAGNOSIS — C250 Malignant neoplasm of head of pancreas: Secondary | ICD-10-CM | POA: Diagnosis not present

## 2018-07-01 HISTORY — PX: ERCP: SHX5425

## 2018-07-01 HISTORY — PX: BILIARY STENT PLACEMENT: SHX5538

## 2018-07-01 HISTORY — PX: SPHINCTEROTOMY: SHX5544

## 2018-07-01 LAB — LIPASE, BLOOD: Lipase: 60 U/L — ABNORMAL HIGH (ref 11–51)

## 2018-07-01 LAB — COMPREHENSIVE METABOLIC PANEL
ALT: 470 U/L — ABNORMAL HIGH (ref 0–44)
AST: 193 U/L — ABNORMAL HIGH (ref 15–41)
Albumin: 3.3 g/dL — ABNORMAL LOW (ref 3.5–5.0)
Alkaline Phosphatase: 367 U/L — ABNORMAL HIGH (ref 38–126)
Anion gap: 13 (ref 5–15)
BUN: 15 mg/dL (ref 8–23)
CO2: 22 mmol/L (ref 22–32)
Calcium: 9 mg/dL (ref 8.9–10.3)
Chloride: 103 mmol/L (ref 98–111)
Creatinine, Ser: 0.54 mg/dL (ref 0.44–1.00)
GFR calc Af Amer: >60 mL/min (ref >60)
GFR calc non Af Amer: >60 mL/min (ref >60)
Glucose, Bld: 126 mg/dL — ABNORMAL HIGH (ref 70–99)
Potassium: 3.8 mmol/L (ref 3.5–5.1)
Sodium: 138 mmol/L (ref 135–145)
Total Bilirubin: 7.6 mg/dL — ABNORMAL HIGH (ref 0.3–1.2)
Total Protein: 6.5 g/dL (ref 6.5–8.1)

## 2018-07-01 LAB — CBC
HCT: 40.1 % (ref 36.0–46.0)
Hemoglobin: 13.3 g/dL (ref 12.0–15.0)
MCH: 31 pg (ref 26.0–34.0)
MCHC: 33.2 g/dL (ref 30.0–36.0)
MCV: 93.5 fL (ref 80.0–100.0)
Platelets: 269 10*3/uL (ref 150–400)
RBC: 4.29 MIL/uL (ref 3.87–5.11)
RDW: 13.8 % (ref 11.5–15.5)
WBC: 5.7 10*3/uL (ref 4.0–10.5)
nRBC: 0 % (ref 0.0–0.2)

## 2018-07-01 LAB — AMYLASE: Amylase: 32 U/L (ref 28–100)

## 2018-07-01 LAB — PROTIME-INR
INR: 0.9 (ref 0.8–1.2)
Prothrombin Time: 12.4 seconds (ref 11.4–15.2)

## 2018-07-01 SURGERY — ERCP, WITH INTERVENTION IF INDICATED
Anesthesia: General

## 2018-07-01 MED ORDER — GLUCAGON HCL RDNA (DIAGNOSTIC) 1 MG IJ SOLR
INTRAMUSCULAR | Status: AC
  Filled 2018-07-01: qty 1

## 2018-07-01 MED ORDER — PROPOFOL 10 MG/ML IV BOLUS
INTRAVENOUS | Status: AC
  Filled 2018-07-01: qty 20

## 2018-07-01 MED ORDER — MIDAZOLAM HCL 2 MG/2ML IJ SOLN
INTRAMUSCULAR | Status: AC
  Filled 2018-07-01: qty 2

## 2018-07-01 MED ORDER — INDOMETHACIN 50 MG RE SUPP
RECTAL | Status: AC
  Filled 2018-07-01: qty 2

## 2018-07-01 MED ORDER — DEXAMETHASONE SODIUM PHOSPHATE 10 MG/ML IJ SOLN
INTRAMUSCULAR | Status: DC | PRN
  Administered 2018-07-01: 8 mg via INTRAVENOUS

## 2018-07-01 MED ORDER — FENTANYL CITRATE (PF) 250 MCG/5ML IJ SOLN
INTRAMUSCULAR | Status: DC | PRN
  Administered 2018-07-01: 50 ug via INTRAVENOUS

## 2018-07-01 MED ORDER — PROPOFOL 10 MG/ML IV BOLUS
INTRAVENOUS | Status: DC | PRN
  Administered 2018-07-01: 150 mg via INTRAVENOUS

## 2018-07-01 MED ORDER — GADOBUTROL 1 MMOL/ML IV SOLN
9.0000 mL | Freq: Once | INTRAVENOUS | Status: AC | PRN
  Administered 2018-07-01: 9 mL via INTRAVENOUS

## 2018-07-01 MED ORDER — SUGAMMADEX SODIUM 200 MG/2ML IV SOLN
INTRAVENOUS | Status: DC | PRN
  Administered 2018-07-01: 350 mg via INTRAVENOUS

## 2018-07-01 MED ORDER — PHENYLEPHRINE 40 MCG/ML (10ML) SYRINGE FOR IV PUSH (FOR BLOOD PRESSURE SUPPORT)
PREFILLED_SYRINGE | INTRAVENOUS | Status: DC | PRN
  Administered 2018-07-01: 120 ug via INTRAVENOUS
  Administered 2018-07-01: 40 ug via INTRAVENOUS

## 2018-07-01 MED ORDER — LIDOCAINE 2% (20 MG/ML) 5 ML SYRINGE
INTRAMUSCULAR | Status: DC | PRN
  Administered 2018-07-01: 40 mg via INTRAVENOUS

## 2018-07-01 MED ORDER — INDOMETHACIN 50 MG RE SUPP
RECTAL | Status: DC | PRN
  Administered 2018-07-01: 100 mg via RECTAL

## 2018-07-01 MED ORDER — FENTANYL CITRATE (PF) 100 MCG/2ML IJ SOLN
INTRAMUSCULAR | Status: AC
  Filled 2018-07-01: qty 2

## 2018-07-01 MED ORDER — GLUCAGON HCL RDNA (DIAGNOSTIC) 1 MG IJ SOLR
INTRAMUSCULAR | Status: DC | PRN
  Administered 2018-07-01 (×6): .25 mg via INTRAVENOUS

## 2018-07-01 MED ORDER — ENOXAPARIN SODIUM 40 MG/0.4ML ~~LOC~~ SOLN: 40.0000 mg | SUBCUTANEOUS | Status: DC

## 2018-07-01 MED ORDER — MIDAZOLAM HCL 2 MG/2ML IJ SOLN
INTRAMUSCULAR | Status: DC | PRN
  Administered 2018-07-01: 2 mg via INTRAVENOUS

## 2018-07-01 MED ORDER — SODIUM CHLORIDE 0.9 % IV SOLN
INTRAVENOUS | Status: DC | PRN
  Administered 2018-07-01: 30 mL

## 2018-07-01 MED ORDER — ONDANSETRON HCL 4 MG/2ML IJ SOLN
INTRAMUSCULAR | Status: DC | PRN
  Administered 2018-07-01: 4 mg via INTRAVENOUS

## 2018-07-01 MED ORDER — INDOMETHACIN 50 MG RE SUPP: 100.0000 mg | Freq: Once | RECTAL | Status: DC

## 2018-07-01 MED ORDER — ROCURONIUM BROMIDE 10 MG/ML (PF) SYRINGE
PREFILLED_SYRINGE | INTRAVENOUS | Status: DC | PRN
  Administered 2018-07-01: 50 mg via INTRAVENOUS

## 2018-07-01 MED ORDER — LACTATED RINGERS IV SOLN
INTRAVENOUS | Status: DC
  Administered 2018-07-01: 1000 mL via INTRAVENOUS

## 2018-07-01 MED ORDER — ATENOLOL 50 MG PO TABS
100.0000 mg | ORAL_TABLET | Freq: Every day | ORAL | Status: DC
  Administered 2018-07-01: 100 mg via ORAL
  Filled 2018-07-01: qty 2

## 2018-07-01 MED ORDER — SUCCINYLCHOLINE CHLORIDE 200 MG/10ML IV SOSY
PREFILLED_SYRINGE | INTRAVENOUS | Status: DC | PRN
  Administered 2018-07-01: 100 mg via INTRAVENOUS

## 2018-07-01 NOTE — Interval H&P Note (Signed)
History and Physical Interval Note:  07/01/2018 10:45 AM  Taylor Oconnor  has presented today for surgery, with the diagnosis of Common bile duct dilation , abnormal liver tests and upper abdominal pain.  The various methods of treatment have been discussed with the patient and family. After consideration of risks, benefits and other options for treatment, the patient has consented to  Procedure(s): ENDOSCOPIC RETROGRADE CHOLANGIOPANCREATOGRAPHY (ERCP) (N/A) as a surgical intervention.  The patient's history has been reviewed, patient examined, no change in status, stable for surgery.  I have reviewed the patient's chart and labs.  Questions were answered to the patient's satisfaction.     Pricilla Riffle. [facility] Plan

## 2018-07-01 NOTE — Progress Notes (Signed)
PROGRESS NOTE    Taylor Oconnor  EXB:[id number] DOB: 05-10-1950 DOA: 06/30/2018 PCP: Unk Pinto, MD    Brief Narrative:  68 yo female who presents with abdominal pain.  She does have significant past medical history for hypertension, dyslipidemia and breast cancer.  Reported epigastric abdominal pain with radiation to her back for about 5 days.  Associated with mild nausea, no vomiting or diarrhea.  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  Patient was referred to the hospital for further evaluation.  On her initial physical examination blood pressure 156/88, heart rate 73, respiratory 16, oxygen saturation 98%, jaundice or pallor, her lungs are clear to auscultation bilaterally, heart S1-S2 present rhythmic, abdomen was soft nontender, no lower extremity edema.  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   Assessment & Plan:   Principal Problem:   Elevated liver enzymes Active Problems:   Essential hypertension   Hyperlipidemia, mixed   Malignant neoplasm of upper-outer quadrant of right breast in female, estrogen receptor positive (HCC)   Abnormal CT of the abdomen   Common bile duct (CBD) obstruction   1.  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 Patient continue to have nausea and abdominal pain, not yet back to her baseline. SP ERCP with single severe lower third of the common bile duct stricture. SP biopsy, dilatation, sphincterectomy and stent placement. Plan to continue workup with abdominal MRI. Continue prophylactic antibiotic therapy for now with IV Zosyn.  Continue antiacid therapy with pantoprazole.   2.  Hypertension. Continue blood pressure monitoring. Continue atenolol and verapamil.    3. Dyslipidemia. Continue statin.   4.  History of breast cancer,.  No active disease. Continue anti-hormonal therapy with anastrazole  5. Obesity. Calculated BMI is 32.   6. Hypokalemia. Continue K correction.   DVT prophylaxis: enoxaparin   Code Status: full Family Communication: no family at the bedside  Disposition Plan/ discharge barriers: pending completing GI workup.   Body mass index is 32.78 kg/m. Malnutrition Type:      Malnutrition Characteristics:      Nutrition Interventions:     RN Pressure Injury Documentation:     Consultants:   GI  Procedures:   ERCP  Antimicrobials:       Subjective: Patient continue to have abdominal pain and nausea, improved but not yet back to baseline, no chest pain or dyspnea.   Objective: Vitals:   07/01/18 1208 07/01/18 1210 07/01/18 1220 07/01/18 1307  BP: (!) 170/88 (!) 176/93 (!) 194/88 (!) 197/81  Pulse: 81 74 72 (!) 51  Resp: 15 11 18    Temp: (!) 97.3 F (36.3 C)   98 F (36.7 C)  TempSrc: Temporal   Oral  SpO2: 100% 100% 95% 95%  Weight:      Height:        Intake/Output Summary (Last 24 hours) at 07/01/2018 1434 Last data filed at 07/01/2018 1402 Gross per 24 hour  Intake 1265.67 ml  Output 300 ml  Net 965.67 ml   Filed Weights   06/30/18 1335  Weight: 85.3 kg    Examination:   General: Not in pain or dyspnea  Neurology: Awake and alert, non focal  E ENT: no pallor, no icterus, oral mucosa moist Cardiovascular: No JVD. S1-S2 present, rhythmic, no gallops, rubs, or murmurs. No  lower extremity edema. Pulmonary: positive breath sounds bilaterally, adequate air movement, no wheezing, rhonchi or rales. Gastrointestinal. Abdomen with no organomegaly, non tender, no rebound or guarding Skin. No rashes Musculoskeletal: no joint deformities     Data Reviewed: I have personally reviewed following labs and imaging studies  CBC: Recent Labs  Lab 06/29/18 1136 06/30/18 1438 07/01/18 0515  WBC 6.7 7.2 5.7  NEUTROABS 4,657  --   --   HGB 15.1 14.5 13.3   HCT 44.8 43.2 40.1  MCV 92.2 94.1 93.5  PLT 356 311 852   Basic Metabolic Panel: Recent Labs  Lab 06/29/18 1136 06/30/18 1438 07/01/18 0515  NA 139 139 138  K 3.7 3.4* 3.8  CL 100 103 103  CO2 27 24 22   GLUCOSE 141* 177* 126*  BUN 10 16 15   CREATININE 0.72 0.66 0.54  CALCIUM 9.6 9.1 9.0   GFR: Estimated Creatinine Clearance: 72.4 mL/min (by C-G formula based on SCr of 0.54 mg/dL). Liver Function Tests: Recent Labs  Lab 06/29/18 1136 06/30/18 1438 07/01/18 0515  AST 256* 193* 193*  ALT 807* 591* 470*  ALKPHOS  --  368* 367*  BILITOT 6.8* 8.0* 7.6*  PROT 6.7 7.5 6.5  ALBUMIN  --  4.0 3.3*   Recent Labs  Lab 06/29/18 1136 06/30/18 1438 07/01/18 0515  LIPASE 140* 76* 60*  AMYLASE 39  --  32   No results for input(s): AMMONIA in the last 168 hours. Coagulation Profile: Recent Labs  Lab 07/01/18 0515  INR 0.9   Cardiac Enzymes: No results for input(s): CKTOTAL, CKMB, CKMBINDEX, TROPONINI in the last 168 hours. BNP (last 3 results) No results for input(s): PROBNP in the last 8760 hours. HbA1C: No results for input(s): HGBA1C in the last 72 hours. CBG: No results for input(s): GLUCAP in the last 168 hours. Lipid Profile: No results for input(s): CHOL, HDL, LDLCALC, TRIG, CHOLHDL, LDLDIRECT in the last 72 hours. Thyroid Function Tests: No results for input(s): TSH, T4TOTAL, FREET4, T3FREE, THYROIDAB in the last 72 hours. Anemia Panel: No results for input(s): VITAMINB12, FOLATE, FERRITIN, TIBC, IRON, RETICCTPCT in the last 72 hours.    Radiology Studies: I have reviewed all of the imaging during this hospital visit personally     Scheduled Meds: . anastrozole  1 mg Oral Daily  . aspirin EC  81 mg Oral Daily  . atenolol  100 mg Oral Daily  . [START ON 07/03/2018] enoxaparin (LOVENOX) injection  40 mg Subcutaneous Q24H  . indomethacin  100 mg Rectal Once  . pantoprazole  40 mg Oral Daily  . rosuvastatin  40 mg Oral Daily  . traZODone  150 mg Oral  QHS  . verapamil  80 mg Oral BID WC   Continuous Infusions: . piperacillin-tazobactam (ZOSYN)  IV 3.375 g (07/01/18 0542)     LOS: 0 days         Gerome Apley, MD

## 2018-07-01 NOTE — Transfer of Care (Signed)
Immediate Anesthesia Transfer of Care Note  Patient: Taylor Oconnor  Procedure(s) Performed: ENDOSCOPIC RETROGRADE CHOLANGIOPANCREATOGRAPHY (ERCP) (N/A )  Patient Location: PACU  Anesthesia Type:General  Level of Consciousness: awake, alert  and oriented  Airway & Oxygen Therapy: Patient Spontanous Breathing and Patient connected to face mask oxygen  Post-op Assessment: Report given to RN and Post -op Vital signs reviewed and stable  Post vital signs: Reviewed and stable  Last Vitals:  Vitals Value Taken Time  BP 170/88 07/01/18 1208  Temp 36.3 C 07/01/18 1208  Pulse 74 07/01/18 1210  Resp 11 07/01/18 1210  SpO2 100 % 07/01/18 1210  Vitals shown include unvalidated device data.  Last Pain:  Vitals:   07/01/18 1208  TempSrc: Temporal  PainSc: 0-No pain      Patients Stated Pain Goal: 3 (28/00/34 9179)  Complications: No apparent anesthesia complications

## 2018-07-01 NOTE — Care Management Obs Status (Signed)
[facility] NOTIFICATION   Patient Details  Name: JAQUAYLA HEGE MRN: [id number] Date of Birth: [birth year]   Medicare Observation Status Notification Given:  Yes    Joaquin Courts, RN 07/01/2018, 1:48 PM

## 2018-07-01 NOTE — Anesthesia Procedure Notes (Signed)
Procedure Name: Intubation Date/Time: 07/01/2018 10:45 AM Performed by: Niel Hummer, CRNA Pre-anesthesia Checklist: Patient being monitored, Suction available, Emergency Drugs available and Patient identified Patient Re-evaluated:Patient Re-evaluated prior to induction Oxygen Delivery Method: Circle system utilized Preoxygenation: Pre-oxygenation with 100% oxygen Induction Type: IV induction and Rapid sequence Laryngoscope Size: Mac and 4 Grade View: Grade I Tube type: Oral Tube size: 7.0 mm Number of attempts: 1 Airway Equipment and Method: Stylet Placement Confirmation: ETT inserted through vocal cords under direct vision,  positive ETCO2 and breath sounds checked- equal and bilateral Secured at: 22 cm Tube secured with: Tape Dental Injury: Teeth and Oropharynx as per pre-operative assessment

## 2018-07-01 NOTE — Anesthesia Postprocedure Evaluation (Signed)
Anesthesia Post Note  Patient: Taylor Oconnor  Procedure(s) Performed: ENDOSCOPIC RETROGRADE CHOLANGIOPANCREATOGRAPHY (ERCP) (N/A ) SPHINCTEROTOMY BILIARY STENT PLACEMENT (N/A )     Patient location during evaluation: Endoscopy Anesthesia Type: General Level of consciousness: awake and alert Pain management: pain level controlled Vital Signs Assessment: post-procedure vital signs reviewed and stable Respiratory status: spontaneous breathing, nonlabored ventilation and respiratory function stable Cardiovascular status: blood pressure returned to baseline and stable Postop Assessment: no apparent nausea or vomiting Anesthetic complications: no    Last Vitals:  Vitals:   07/01/18 1210 07/01/18 1220  BP: (!) 176/93 (!) 194/88  Pulse: 74 72  Resp: 11 18  Temp:    SpO2: 100% 95%    Last Pain:  Vitals:   07/01/18 1208  TempSrc: Temporal  PainSc: 0-No pain                 Lynda Rainwater

## 2018-07-01 NOTE — Progress Notes (Signed)
Notified MD. Dr. Riccardo Dubin of Patient's Blood pressure of 197/81.

## 2018-07-01 NOTE — Op Note (Signed)
[facility] Patient Name: Taylor Oconnor Procedure Date: 07/01/2018 MRN: [id number] Attending MD: Ladene Artist , MD Date of Birth: 27-Dec-1950 CSN: [id number] Age: 68 Admit Type: Inpatient Procedure:                ERCP Indications:              Biliary dilation on CT Scan, Jaundice, Elevated                            liver enzymes, Abnl HOP on CT scan Providers:                Pricilla Riffle. Fuller Plan, MD, Cleda Daub, RN, Elspeth Cho Tech., Technician, Caryl Pina, CRNA Referring MD:             [facility] Medicines:                General Anesthesia Complications:            No immediate complications. Estimated Blood Loss:     Estimated blood loss was minimal. Procedure:                Pre-Anesthesia Assessment:                           - Prior to the procedure, a History and Physical                            was performed, and patient medications and                            allergies were reviewed. The patient's tolerance of                            previous anesthesia was also reviewed. The risks                            and benefits of the procedure and the sedation                            options and risks were discussed with the patient.                            All questions were answered, and informed consent                            was obtained. Prior Anticoagulants: The patient has                            taken no previous anticoagulant or antiplatelet                            agents. ASA Grade Assessment: II - A patient with  mild systemic disease. After reviewing the risks                            and benefits, the patient was deemed in                            satisfactory condition to undergo the procedure.                           After obtaining informed consent, the scope was                            passed under direct vision. Throughout the                procedure, the patient's blood pressure, pulse, and                            oxygen saturations were monitored continuously. The                            [id number]-Q180V ([id number] Olympus duodenoscope was                            introduced through the mouth, and used to inject                            contrast into and used to inject contrast into the                            bile duct. The ERCP was accomplished without                            difficulty. The patient tolerated the procedure                            well. Scope In: Scope Out: Findings:      A scout film of the abdomen was obtained. Surgical clips, consistent       with a previous cholecystectomy, were seen in the area of the right       upper quadrant of the abdomen. The esophagus was successfully intubated       under direct vision. The scope was advanced to a normal but small major       papilla in the descending duodenum without detailed examination of the       pharynx, larynx and associated structures, and upper GI tract. Prior lap       band noted which limited positioning of the scope in the duodenum. The       upper GI tract was otherwise grossly normal. The minor ampulla was       normal. After several attempts to gain apppropriate positioning the       straight Roadrunner wire was passed into the biliary tree. The       short-nosed traction sphincterotome was passed over the guidewire and       the bile duct was then deeply cannulated. Contrast was injected. I  personally interpreted the bile duct images. There was appropriate flow       of contrast through the ducts. Image quality was adequate. The entire       biliary tree was diffusely dilated, secondary to a distal CBD stricture.       The largest diameter was 14 mm. A cholecystectomy had been performed.       The lower third of the main bile duct contained a single segmental       severe stenosis 12 mm in length. The stricture was  brushed for cytology.       The major papilla was small so a 6 mm biliary sphincterotomy was made       with a braided traction (standard) sphincterotome using ERBE       electrocautery to better accomodate biliary stent placement No bleeding       at the sphincterotomy. One 10 Fr by 5 cm plastic stent with a single       external flap and a single internal flap was placed 4 cm into the common       bile duct. Bile and clear fluid flowed through the stent. The stent was       in good position. The PD was not cannulated or injected. Impression:               - A single severe segmental biliary stricture was                            found in the lower third of the common bile duct.                            The stricture was malignant appearing. Brushed.                           - The entire biliary tree proximal to the stricture                            was dilated, secondary to the stricture.                           - Prior cholecystectomy.                           - A biliary sphincterotomy was performed.                           - One plastic stent was placed into the common bile                            duct. Moderate Sedation:      Not Applicable - Patient had care per Anesthesia. Recommendation:           - Return patient to hospital ward for ongoing care.                           - Observe patient's clinical course following  today's ERCP with therapeutic intervention.                           - Avoid aspirin and nonsteroidal anti-inflammatory                            medicines for 1 week following Indocin today.                           - Await cytology.                           - Trend LFTs.                           - Abd MRI and consider EUS to further evaluate                            suspected pancreatic mass.                           - Biliary stent replacement in 3 months. Procedure Code(s):        --- Professional ---                            [phone], Endoscopic retrograde                            cholangiopancreatography (ERCP); with placement of                            endoscopic stent into biliary or pancreatic duct,                            including pre- and post-dilation and guide wire                            passage, when performed, including sphincterotomy,                            when performed, each stent Diagnosis Code(s):        --- Professional ---                           K83.1, Obstruction of bile duct                           Z90.49, Acquired absence of other specified parts                            of digestive tract                           R17, Unspecified jaundice                           R74.8, Abnormal levels of other serum enzymes  K83.8, Other specified diseases of biliary tract CPT copyright 2019 [employer]. All rights reserved. The codes documented in this report are preliminary and upon coder review may  be revised to meet current compliance requirements. Ladene Artist, MD 07/01/2018 12:06:28 PM This report has been signed electronically. Number of Addenda: 0

## 2018-07-01 NOTE — Anesthesia Preprocedure Evaluation (Signed)
Anesthesia Evaluation  Patient identified by MRN, date of birth, ID band Patient awake    Reviewed: Allergy & Precautions, NPO status , Patient's Chart, lab work & pertinent test results, reviewed documented beta blocker date and time   History of Anesthesia Complications (+) history of anesthetic complications  Airway Mallampati: III  TM Distance: >3 FB Neck ROM: Full    Dental no notable dental hx. (+) Teeth Intact, Dental Advisory Given   Pulmonary neg pulmonary ROS,    Pulmonary exam normal breath sounds clear to auscultation       Cardiovascular hypertension, Pt. on medications and Pt. on home beta blockers negative cardio ROS Normal cardiovascular exam Rhythm:Regular Rate:Normal     Neuro/Psych PSYCHIATRIC DISORDERS Anxiety negative neurological ROS     GI/Hepatic Neg liver ROS, hiatal hernia, GERD  ,  Endo/Other  negative endocrine ROSdiabetes, Well Controlled, Type 2  Renal/GU Renal InsufficiencyRenal disease  negative genitourinary   Musculoskeletal negative musculoskeletal ROS (+)   Abdominal   Peds negative pediatric ROS (+)  Hematology negative hematology ROS (+)   Anesthesia Other Findings Right ureteral stone  Reproductive/Obstetrics negative OB ROS                             Anesthesia Physical  Anesthesia Plan  ASA: III  Anesthesia Plan: General   Post-op Pain Management:    Induction: Intravenous  PONV Risk Score and Plan: 3 and Midazolam, Dexamethasone, Ondansetron and Treatment may vary due to age or medical condition  Airway Management Planned: Oral ETT  Additional Equipment:   Intra-op Plan:   Post-operative Plan: Extubation in OR  Informed Consent: I have reviewed the patients History and Physical, chart, labs and discussed the procedure including the risks, benefits and alternatives for the proposed anesthesia with the patient or authorized  representative who has indicated his/her understanding and acceptance.     Dental advisory given  Plan Discussed with: CRNA  Anesthesia Plan Comments:         Anesthesia Quick Evaluation

## 2018-07-02 DIAGNOSIS — K8689 Other specified diseases of pancreas: Secondary | ICD-10-CM

## 2018-07-02 LAB — HIV ANTIBODY (ROUTINE TESTING W REFLEX): HIV Screen 4th Generation wRfx: NONREACTIVE

## 2018-07-02 LAB — CBC WITH DIFFERENTIAL/PLATELET
Abs Immature Granulocytes: 0.05 10*3/uL (ref 0.00–0.07)
Basophils Absolute: 0 10*3/uL (ref 0.0–0.1)
Basophils Relative: 0 %
Eosinophils Absolute: 0 10*3/uL (ref 0.0–0.5)
Eosinophils Relative: 0 %
HCT: 42.7 % (ref 36.0–46.0)
Hemoglobin: 13.8 g/dL (ref 12.0–15.0)
Immature Granulocytes: 1 %
Lymphocytes Relative: 14 %
Lymphs Abs: 1.3 10*3/uL (ref 0.7–4.0)
MCH: 30.5 pg (ref 26.0–34.0)
MCHC: 32.3 g/dL (ref 30.0–36.0)
MCV: 94.3 fL (ref 80.0–100.0)
Monocytes Absolute: 0.9 10*3/uL (ref 0.1–1.0)
Monocytes Relative: 9 %
Neutro Abs: 7.6 10*3/uL (ref 1.7–7.7)
Neutrophils Relative %: 76 %
Platelets: 285 10*3/uL (ref 150–400)
RBC: 4.53 MIL/uL (ref 3.87–5.11)
RDW: 14 % (ref 11.5–15.5)
WBC: 9.8 10*3/uL (ref 4.0–10.5)
nRBC: 0 % (ref 0.0–0.2)

## 2018-07-02 LAB — COMPREHENSIVE METABOLIC PANEL
ALT: 339 U/L — ABNORMAL HIGH (ref 0–44)
AST: 66 U/L — ABNORMAL HIGH (ref 15–41)
Albumin: 3.2 g/dL — ABNORMAL LOW (ref 3.5–5.0)
Alkaline Phosphatase: 344 U/L — ABNORMAL HIGH (ref 38–126)
Anion gap: 12 (ref 5–15)
BUN: 15 mg/dL (ref 8–23)
CO2: 24 mmol/L (ref 22–32)
Calcium: 9.2 mg/dL (ref 8.9–10.3)
Chloride: 103 mmol/L (ref 98–111)
Creatinine, Ser: 0.73 mg/dL (ref 0.44–1.00)
GFR calc Af Amer: >60 mL/min (ref >60)
GFR calc non Af Amer: >60 mL/min (ref >60)
Glucose, Bld: 160 mg/dL — ABNORMAL HIGH (ref 70–99)
Potassium: 4 mmol/L (ref 3.5–5.1)
Sodium: 139 mmol/L (ref 135–145)
Total Bilirubin: 3 mg/dL — ABNORMAL HIGH (ref 0.3–1.2)
Total Protein: 6.4 g/dL — ABNORMAL LOW (ref 6.5–8.1)

## 2018-07-02 NOTE — H&P (View-Only) (Signed)
Progress Note   Subjective  Mild intermittent epigastric pain is unchanged, otherwise feels well   Objective  Vital signs in last 24 hours: Temp:  [97.3 F (36.3 C)-98.9 F (37.2 C)] 98.2 F (36.8 C) (06/20 0459) Pulse Rate:  [51-81] 63 (06/20 0459) Resp:  [11-20] 20 (06/20 0459) BP: (135-197)/(74-93) 176/77 (06/20 0459) SpO2:  [94 %-100 %] 95 % (06/20 0459) Last BM Date: 07/01/18  General: Alert, well-developed, in NAD Heart:  Regular rate and rhythm; no murmurs Chest: Clear to ascultation bilaterally Abdomen:  Soft, nontender and nondistended. Normal bowel sounds, without guarding, and without rebound.   Extremities:  Without edema. Neurologic:  Alert and  oriented x4; grossly normal neurologically. Psych:  Alert and cooperative. Normal mood and affect.  Intake/Output from previous day: 06/19 0701 - 06/20 0700 In: 2126.4 [P.O.:1180; I.V.:750; IV Piggyback:196.4] Out: 650 [Urine:650] Intake/Output this shift: Total I/O In: 338.9 [P.O.:300; IV Piggyback:38.9] Out: 0   Lab Results: Recent Labs    06/30/18 1438 07/01/18 0515 07/02/18 0419  WBC 7.2 5.7 9.8  HGB 14.5 13.3 13.8  HCT 43.2 40.1 42.7  PLT 311 269 285   BMET Recent Labs    06/30/18 1438 07/01/18 0515 07/02/18 0419  NA 139 138 139  K 3.4* 3.8 4.0  CL 103 103 103  CO2 24 22 24   GLUCOSE 177* 126* 160*  BUN 16 15 15   CREATININE 0.66 0.54 0.73  CALCIUM 9.1 9.0 9.2   LFT Recent Labs    07/02/18 0419  PROT 6.4*  ALBUMIN 3.2*  AST 66*  ALT 339*  ALKPHOS 344*  BILITOT 3.0*   PT/INR Recent Labs    07/01/18 0515  LABPROT 12.4  INR 0.9   Hepatitis Panel No results for input(s): HEPBSAG, HCVAB, HEPAIGM, HEPBIGM in the last 72 hours.  Studies/Results: Mr 3d Recon At Scanner  Result Date: 07/01/2018 CLINICAL DATA:  Biliary ductal dilatation status post ERCP with stent placement. EXAM: MRI ABDOMEN WITHOUT AND WITH CONTRAST (INCLUDING MRCP) TECHNIQUE: Multiplanar multisequence MR  imaging of the abdomen was performed both before and after the administration of intravenous contrast. Heavily T2-weighted images of the biliary and pancreatic ducts were obtained, and three-dimensional MRCP images were rendered by post processing. CONTRAST:  9 cc Gadavist COMPARISON:  06/20/2018 FINDINGS: Lower chest: No acute findings. Hepatobiliary: There is been interval decompression of the dilated bile ducts status post stent placement. Within the caudate lobe of liver there is a 1.8 cm T1 hyperintense and T2 hypointense liver lesion. This demonstrates some mild delayed internal enhancement. This is indeterminate. Adjacent low T1 signal intensity and increased T1 signal intensity structure measures 0.6 cm, image 18/3. No scratch set this exhibits relative hypoenhancement compared to the adjacent liver. A third T2 hyperintense and T1 hypointense structure is identified within segment 5 measuring 0.9 cm. This is too small to reliably characterize. Pancreas: Atrophy of the neck, body and tail of pancreas is identified with increase caliber of the main duct measuring up to 6 mm. The pancreatic parenchymal atrophy demonstrates progression when compared with exam from 02/27/2018. The main duct dilatation is new. Within the head of pancreas there is a mass which is hypoenhancing to the adjacent pancreas measuring 2 by 1.7 by 2.4 cm. This mass abuts the portal venous confluence without definite evidence for encasement. Small filling defect within the superior mesenteric vein measures 0.6 cm, image 52/2. There does not appear to be involvement of the superior mesenteric artery or celiac trunk. There is mild  soft tissue stranding surrounding the head of pancreas and extending around the duodenum is noted concerning for superimposed pancreatitis. Spleen:  Within normal limits in size and appearance. Adrenals/Urinary Tract: Normal appearance of the adrenal glands. The kidneys are unremarkable. No kidney mass or  hydronephrosis. Stomach/Bowel: Lap band noted around the proximal stomach. Visualized small and large bowel loops are unremarkable. Vascular/Lymphatic: Normal appearance of the abdominal aorta. No aneurysm. No adenopathy identified. Other: Small amount of free fluid arising from the area around the head of pancreas noted. No focal fluid collections. Musculoskeletal: No suspicious bone lesions identified. IMPRESSION: 1. There is a hypoenhancing mass involving the head of pancreas. There is associated atrophy of the pancreatic neck, body and tail with dilatation of the main duct. Imaging findings are concerning for pancreatic adenocarcinoma. This mass abuts the portal venous confluence without complete encasement. The main portal vein remains patent. No evidence for SMA or celiac involvement. Further evaluation with endoscopic ultrasound with tissue sampling is advised. 2. There are several indeterminate liver lesions identified. The largest is in the caudate lobe measuring 1.8 cm. Although this exhibits delayed internal enhancement (which may be a feature benign hemangioma) underlying liver metastasis cannot be excluded. The additional, smaller, less than 10 mm liver lesions are too small to reliably characterize. In the event biopsy-proven pancreatic adenocarcinoma consider further staging workup with PET-CT. 3. Suspect focal pancreatitis involving the head of pancreas. 4. Interval decompression of the bile ducts status post stent placement. 5. Small filling defect noted within the superior mesenteric vein which may represent thrombus. The portal vein however remains patent. Electronically Signed   By: Kerby Moors M.D.   On: 07/01/2018 16:12   Dg Ercp  Result Date: 07/01/2018 CLINICAL DATA:  68 year old female with a history of choledocholithiasis EXAM: ERCP TECHNIQUE: Multiple spot images obtained with the fluoroscopic device and submitted for interpretation post-procedure. FLUOROSCOPY TIME:  Fluoroscopy  Time:  3 minutes 15 seconds COMPARISON:  None. FINDINGS: Multiple intraoperative fluoroscopic spot images during ERCP. Initial image demonstrates endoscope projecting over the upper abdomen. Overlying reservoir and tubing for gastric banding. There is then cannulation of the ampulla and retrograde infusion of contrast partially opacifying the extrahepatic biliary ducts. Placement of a plastic biliary stent. IMPRESSION: Limited images during ERCP demonstrates placement of plastic biliary stent in the common bile duct. Please refer to the dictated operative report for full details of intraoperative findings and procedure. Electronically Signed   By: Corrie Mckusick D.O.   On: 07/01/2018 12:34   Mr Abdomen Mrcp Moise Boring Contast  Result Date: 07/01/2018 CLINICAL DATA:  Biliary ductal dilatation status post ERCP with stent placement. EXAM: MRI ABDOMEN WITHOUT AND WITH CONTRAST (INCLUDING MRCP) TECHNIQUE: Multiplanar multisequence MR imaging of the abdomen was performed both before and after the administration of intravenous contrast. Heavily T2-weighted images of the biliary and pancreatic ducts were obtained, and three-dimensional MRCP images were rendered by post processing. CONTRAST:  9 cc Gadavist COMPARISON:  06/20/2018 FINDINGS: Lower chest: No acute findings. Hepatobiliary: There is been interval decompression of the dilated bile ducts status post stent placement. Within the caudate lobe of liver there is a 1.8 cm T1 hyperintense and T2 hypointense liver lesion. This demonstrates some mild delayed internal enhancement. This is indeterminate. Adjacent low T1 signal intensity and increased T1 signal intensity structure measures 0.6 cm, image 18/3. No scratch set this exhibits relative hypoenhancement compared to the adjacent liver. A third T2 hyperintense and T1 hypointense structure is identified within segment 5 measuring 0.9  cm. This is too small to reliably characterize. Pancreas: Atrophy of the neck, body and  tail of pancreas is identified with increase caliber of the main duct measuring up to 6 mm. The pancreatic parenchymal atrophy demonstrates progression when compared with exam from 02/27/2018. The main duct dilatation is new. Within the head of pancreas there is a mass which is hypoenhancing to the adjacent pancreas measuring 2 by 1.7 by 2.4 cm. This mass abuts the portal venous confluence without definite evidence for encasement. Small filling defect within the superior mesenteric vein measures 0.6 cm, image 52/2. There does not appear to be involvement of the superior mesenteric artery or celiac trunk. There is mild soft tissue stranding surrounding the head of pancreas and extending around the duodenum is noted concerning for superimposed pancreatitis. Spleen:  Within normal limits in size and appearance. Adrenals/Urinary Tract: Normal appearance of the adrenal glands. The kidneys are unremarkable. No kidney mass or hydronephrosis. Stomach/Bowel: Lap band noted around the proximal stomach. Visualized small and large bowel loops are unremarkable. Vascular/Lymphatic: Normal appearance of the abdominal aorta. No aneurysm. No adenopathy identified. Other: Small amount of free fluid arising from the area around the head of pancreas noted. No focal fluid collections. Musculoskeletal: No suspicious bone lesions identified. IMPRESSION: 1. There is a hypoenhancing mass involving the head of pancreas. There is associated atrophy of the pancreatic neck, body and tail with dilatation of the main duct. Imaging findings are concerning for pancreatic adenocarcinoma. This mass abuts the portal venous confluence without complete encasement. The main portal vein remains patent. No evidence for SMA or celiac involvement. Further evaluation with endoscopic ultrasound with tissue sampling is advised. 2. There are several indeterminate liver lesions identified. The largest is in the caudate lobe measuring 1.8 cm. Although this  exhibits delayed internal enhancement (which may be a feature benign hemangioma) underlying liver metastasis cannot be excluded. The additional, smaller, less than 10 mm liver lesions are too small to reliably characterize. In the event biopsy-proven pancreatic adenocarcinoma consider further staging workup with PET-CT. 3. Suspect focal pancreatitis involving the head of pancreas. 4. Interval decompression of the bile ducts status post stent placement. 5. Small filling defect noted within the superior mesenteric vein which may represent thrombus. The portal vein however remains patent. Electronically Signed   By: Kerby Moors M.D.   On: 07/01/2018 16:12      Assessment & Recommendations   1. Pancreatic mass with biliary obstruction concerning for pancreatic cancer. LFTs improving following biliary stent placement. Awaiting CBD brush cytology results which are expected Monday. Outpatient EUS likely the next step which will be arranged. DC Zosyn without evidence of infection. Advance diet. OK for discharge home today from GI standpoint with outpatient GI follow up with Dr. Silverio Decamp.     LOS: 1 day   Pricilla Riffle. Fuller Plan  MD 07/02/2018, 11:25 AM

## 2018-07-02 NOTE — Discharge Summary (Addendum)
Physician Discharge Summary  Taylor Oconnor ZSW:[id number] DOB: 1950/10/19 DOA: 06/30/2018  PCP: Unk Pinto, MD  Admit date: 06/30/2018 Discharge date: 07/02/2018  Admitted From: Home  Disposition:  Home   Recommendations for Outpatient Follow-up and new medication changes:  1. Follow up with Dr. Melford Aase in 7 days. 2. Follow with the GI clinic as scheduled, for biopsy result and further diagnostics.     Home Health: no   Equipment/Devices: no    Discharge Condition: stable  CODE STATUS: full  Diet recommendation: heart healthy   Brief/Interim Summary: 68 yo femalewho presented with abdominal pain. She does have significant past medical history for hypertension, dyslipidemia and breast cancer. Reported epigastric abdominal pain with radiation to her back for about 5 days, associated with mild nausea, no vomiting or diarrhea. She was seen by her primary care provider, her liver enzymes were noted to be elevated,further work-up with CT of the abdomen showed dilatation of the bile ducts and possible pancreatic head neoplasm. Patient was referred to the hospital for further evaluation. On her initial physical examination blood pressure 156/88, heart rate 73, respiratory rate16, oxygen saturation 98%,no jaundice or pallor,her lungs were clear to auscultation bilaterally, heart S1-S2 present and rhythmic, abdomen was soft nontender, no lower extremity edema.  Sodium 138, potassium 3.4, chloride 103, bicarb 24, glucose 177, BUN 16, creatinine 1.66, alkaline phosphatase 368, lipase 76, AST 193, ALT 591, total bilirubins 8.0, white count 7.2, hemoglobin 14.5, hematocrit 43.2, platelets 311.  SARS COVID-19 negative, urine analysis negative for infection.  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  1.  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  Patient was admitted to the medical ward, she received IV fluids, po antiacids, empiric  IV antibiotics and as needed IV antiemetics.  Further work-up with ERCP showed a single severe lower third of the common bile duct stricture, patient underwent biopsy, dilatation, sphincterectomy and stent placement.  Abdominal MRI with hypoenhancing mass involving the head of the pancreas, associated  atrophy of the pancreatic neck, body and tail with dilatation of the main duct.  Images are concerning for pancreatic adenocarcinoma.  The main portal vein was patent.  Patient will follow-up with the GI clinic as an outpatient for biopsy results and further work-up.  Patient may need endoscopic ultrasound as an outpatient.  Discharge alkaline phosphatase 344, AST 66, ALT 339, total bilirubins 3.0.   2.  Hypertension.  Continue blood pressure control with atenolol and verapamil.  3.  Dyslipidemia.  Patient will continue statin therapy.  4.  Breast cancer.  Patient will continue anastrozole.  5.  Obesity.  Her calculated BMI is 32.  6.  Transient hypokalemia.  It was corrected with potassium chloride, her discharge potassium is 4.0, preserved kidney function with a serum creatinine of 0.73 and a bicarbonate of 24.      Discharge Diagnoses:  Principal Problem:   Elevated liver enzymes Active Problems:   Essential hypertension   Hyperlipidemia, mixed   Malignant neoplasm of upper-outer quadrant of right breast in female, estrogen receptor positive (HCC)   Abnormal CT of the abdomen   Common bile duct (CBD) obstruction    Discharge Instructions   Allergies as of 07/02/2018      Reactions   Codeine Itching   Gabapentin Other (See Comments)   Reaction unknown   Lipitor [atorvastatin] Other (See Comments)   Severe fatigue. ? memory loss.   Ace Inhibitors Cough  Medication List    TAKE these medications   acidophilus Caps capsule Take 1 capsule by mouth daily after supper. Reported on 02/19/2015   anastrozole 1 MG tablet Commonly known as: ARIMIDEX TAKE 1 TABLET BY MOUTH EVERY  DAY What changed:   how much to take  how to take this  when to take this  additional instructions   aspirin EC 81 MG tablet Take 81 mg by mouth daily.   atenolol 100 MG tablet Commonly known as: TENORMIN TAKE 1 TABLET BY MOUTH EVERY DAY What changed:   how much to take  when to take this   b complex vitamins capsule Take 1 capsule by mouth daily after supper.   BIOTIN PO Take 1 capsule by mouth daily after supper.   Cinnamon 500 MG Tabs Take 500 mg by mouth daily after supper.   Fish Oil 1000 MG Caps Take 1,000 mg by mouth daily after supper.   Magnesium 250 MG Tabs Take 250 mg by mouth 2 (two) times daily.   multivitamin with minerals tablet Take 1 tablet by mouth 2 (two) times daily.   omeprazole 20 MG capsule Commonly known as: PriLOSEC Take 1 capsule (20 mg total) by mouth 2 (two) times daily before a meal for 14 days. 14 days   ondansetron 4 MG tablet Commonly known as: ZOFRAN Take 1 tablet (4 mg total) by mouth every 8 (eight) hours as needed for nausea or vomiting.   OVER THE COUNTER MEDICATION Take 1 tablet by mouth 2 (two) times daily. Tumeric with black pepper   promethazine 25 MG tablet Commonly known as: PHENERGAN Take 1/2-1 tab every 6 hours as needed for nausea. What changed:   how much to take  how to take this  when to take this  reasons to take this  additional instructions   rosuvastatin 40 MG tablet Commonly known as: CRESTOR Take 1 tablet daily for Cholesterol What changed:   how much to take  how to take this  when to take this  additional instructions   traZODone 150 MG tablet Commonly known as: DESYREL Take 1/2 to 1 tablet 1 to 2 hours before Bedtime What changed:   how much to take  how to take this  additional instructions   Tums 500 MG chewable tablet Generic drug: calcium carbonate Chew 1,000 mg by mouth daily after supper.   verapamil 80 MG tablet Commonly known as: CALAN Take 1 tablet 2 x  /day with meal for BP What changed:   how much to take  how to take this  when to take this  additional instructions   Vitamin D3 125 MCG (5000 UT) Caps Take 5,000 Units by mouth daily after supper.   Zinc 50 MG Tabs Take 50 mg by mouth daily after supper.       Allergies  Allergen Reactions  . Codeine Itching  . Gabapentin Other (See Comments)    Reaction unknown  . Lipitor [Atorvastatin] Other (See Comments)    Severe fatigue. ? memory loss.  . Ace Inhibitors Cough    Consultations:  GI    Procedures/Studies: Ct Abdomen Pelvis Wo Contrast  Result Date: 06/30/2018 CLINICAL DATA:  Abdominal pain and swelling, decreased appetite. Abnormal lab work. EXAM: CT ABDOMEN AND PELVIS WITHOUT CONTRAST TECHNIQUE: Multidetector CT imaging of the abdomen and pelvis was performed following the standard protocol without IV contrast. COMPARISON:  CT abdomen dated 02/27/2018. FINDINGS: Lower chest: No acute abnormality. Hepatobiliary: New common bile duct dilatation and  intrahepatic bile duct dilatation. CBD measures 1.7 cm diameter. No focal liver abnormality. Status post cholecystectomy. Pancreas: New fullness of the pancreatic head. No pancreatic ductal dilatation seen. Spleen: Normal in size without focal abnormality. Adrenals/Urinary Tract: Adrenal glands appear normal. Kidneys are unremarkable without mass, stone or hydronephrosis. No ureteral or bladder calculi identified. Bladder is decompressed. Stomach/Bowel: No dilated large or small bowel loops. No evidence of bowel wall inflammation. Status post appendectomy. Lap band appears stable in position/configuration without evidence of complicating feature. Stomach otherwise unremarkable. Vascular/Lymphatic: No significant vascular findings are present. No enlarged abdominal or pelvic lymph nodes. Reproductive: Uterus and bilateral adnexa are unremarkable. Other: No free fluid or abscess collection seen. No free intraperitoneal air.  Musculoskeletal: No acute or suspicious osseous finding. Mild degenerative spondylosis of the lumbar spine. IMPRESSION: 1. New common bile duct dilatation and intrahepatic bile duct dilatation. New fullness of the pancreatic head which could represent rapidly developing obstructive neoplastic process or sequela of recent localized pancreatitis. Recommend further characterization with MRCP or ERCP. 2. No other acute or significant findings. No bowel obstruction or evidence of bowel wall inflammation. No free fluid or abscess collection. No free intraperitoneal air. 3. Additional chronic/incidental findings detailed above. These results will be called to the ordering clinician or representative by the Radiologist Assistant, and communication documented in the PACS or zVision Dashboard. Electronically Signed   By: Franki Cabot M.D.   On: 06/30/2018 11:23   Mr 3d Recon At Scanner  Result Date: 07/01/2018 CLINICAL DATA:  Biliary ductal dilatation status post ERCP with stent placement. EXAM: MRI ABDOMEN WITHOUT AND WITH CONTRAST (INCLUDING MRCP) TECHNIQUE: Multiplanar multisequence MR imaging of the abdomen was performed both before and after the administration of intravenous contrast. Heavily T2-weighted images of the biliary and pancreatic ducts were obtained, and three-dimensional MRCP images were rendered by post processing. CONTRAST:  9 cc Gadavist COMPARISON:  06/20/2018 FINDINGS: Lower chest: No acute findings. Hepatobiliary: There is been interval decompression of the dilated bile ducts status post stent placement. Within the caudate lobe of liver there is a 1.8 cm T1 hyperintense and T2 hypointense liver lesion. This demonstrates some mild delayed internal enhancement. This is indeterminate. Adjacent low T1 signal intensity and increased T1 signal intensity structure measures 0.6 cm, image 18/3. No scratch set this exhibits relative hypoenhancement compared to the adjacent liver. A third T2 hyperintense and  T1 hypointense structure is identified within segment 5 measuring 0.9 cm. This is too small to reliably characterize. Pancreas: Atrophy of the neck, body and tail of pancreas is identified with increase caliber of the main duct measuring up to 6 mm. The pancreatic parenchymal atrophy demonstrates progression when compared with exam from 02/27/2018. The main duct dilatation is new. Within the head of pancreas there is a mass which is hypoenhancing to the adjacent pancreas measuring 2 by 1.7 by 2.4 cm. This mass abuts the portal venous confluence without definite evidence for encasement. Small filling defect within the superior mesenteric vein measures 0.6 cm, image 52/2. There does not appear to be involvement of the superior mesenteric artery or celiac trunk. There is mild soft tissue stranding surrounding the head of pancreas and extending around the duodenum is noted concerning for superimposed pancreatitis. Spleen:  Within normal limits in size and appearance. Adrenals/Urinary Tract: Normal appearance of the adrenal glands. The kidneys are unremarkable. No kidney mass or hydronephrosis. Stomach/Bowel: Lap band noted around the proximal stomach. Visualized small and large bowel loops are unremarkable. Vascular/Lymphatic: Normal appearance of the  abdominal aorta. No aneurysm. No adenopathy identified. Other: Small amount of free fluid arising from the area around the head of pancreas noted. No focal fluid collections. Musculoskeletal: No suspicious bone lesions identified. IMPRESSION: 1. There is a hypoenhancing mass involving the head of pancreas. There is associated atrophy of the pancreatic neck, body and tail with dilatation of the main duct. Imaging findings are concerning for pancreatic adenocarcinoma. This mass abuts the portal venous confluence without complete encasement. The main portal vein remains patent. No evidence for SMA or celiac involvement. Further evaluation with endoscopic ultrasound with  tissue sampling is advised. 2. There are several indeterminate liver lesions identified. The largest is in the caudate lobe measuring 1.8 cm. Although this exhibits delayed internal enhancement (which may be a feature benign hemangioma) underlying liver metastasis cannot be excluded. The additional, smaller, less than 10 mm liver lesions are too small to reliably characterize. In the event biopsy-proven pancreatic adenocarcinoma consider further staging workup with PET-CT. 3. Suspect focal pancreatitis involving the head of pancreas. 4. Interval decompression of the bile ducts status post stent placement. 5. Small filling defect noted within the superior mesenteric vein which may represent thrombus. The portal vein however remains patent. Electronically Signed   By: Kerby Moors M.D.   On: 07/01/2018 16:12   Dg Ercp  Result Date: 07/01/2018 CLINICAL DATA:  68 year old female with a history of choledocholithiasis EXAM: ERCP TECHNIQUE: Multiple spot images obtained with the fluoroscopic device and submitted for interpretation post-procedure. FLUOROSCOPY TIME:  Fluoroscopy Time:  3 minutes 15 seconds COMPARISON:  None. FINDINGS: Multiple intraoperative fluoroscopic spot images during ERCP. Initial image demonstrates endoscope projecting over the upper abdomen. Overlying reservoir and tubing for gastric banding. There is then cannulation of the ampulla and retrograde infusion of contrast partially opacifying the extrahepatic biliary ducts. Placement of a plastic biliary stent. IMPRESSION: Limited images during ERCP demonstrates placement of plastic biliary stent in the common bile duct. Please refer to the dictated operative report for full details of intraoperative findings and procedure. Electronically Signed   By: Corrie Mckusick D.O.   On: 07/01/2018 12:34   Mr Abdomen Mrcp Moise Boring Contast  Result Date: 07/01/2018 CLINICAL DATA:  Biliary ductal dilatation status post ERCP with stent placement. EXAM: MRI ABDOMEN  WITHOUT AND WITH CONTRAST (INCLUDING MRCP) TECHNIQUE: Multiplanar multisequence MR imaging of the abdomen was performed both before and after the administration of intravenous contrast. Heavily T2-weighted images of the biliary and pancreatic ducts were obtained, and three-dimensional MRCP images were rendered by post processing. CONTRAST:  9 cc Gadavist COMPARISON:  06/20/2018 FINDINGS: Lower chest: No acute findings. Hepatobiliary: There is been interval decompression of the dilated bile ducts status post stent placement. Within the caudate lobe of liver there is a 1.8 cm T1 hyperintense and T2 hypointense liver lesion. This demonstrates some mild delayed internal enhancement. This is indeterminate. Adjacent low T1 signal intensity and increased T1 signal intensity structure measures 0.6 cm, image 18/3. No scratch set this exhibits relative hypoenhancement compared to the adjacent liver. A third T2 hyperintense and T1 hypointense structure is identified within segment 5 measuring 0.9 cm. This is too small to reliably characterize. Pancreas: Atrophy of the neck, body and tail of pancreas is identified with increase caliber of the main duct measuring up to 6 mm. The pancreatic parenchymal atrophy demonstrates progression when compared with exam from 02/27/2018. The main duct dilatation is new. Within the head of pancreas there is a mass which is hypoenhancing to the adjacent pancreas  measuring 2 by 1.7 by 2.4 cm. This mass abuts the portal venous confluence without definite evidence for encasement. Small filling defect within the superior mesenteric vein measures 0.6 cm, image 52/2. There does not appear to be involvement of the superior mesenteric artery or celiac trunk. There is mild soft tissue stranding surrounding the head of pancreas and extending around the duodenum is noted concerning for superimposed pancreatitis. Spleen:  Within normal limits in size and appearance. Adrenals/Urinary Tract: Normal  appearance of the adrenal glands. The kidneys are unremarkable. No kidney mass or hydronephrosis. Stomach/Bowel: Lap band noted around the proximal stomach. Visualized small and large bowel loops are unremarkable. Vascular/Lymphatic: Normal appearance of the abdominal aorta. No aneurysm. No adenopathy identified. Other: Small amount of free fluid arising from the area around the head of pancreas noted. No focal fluid collections. Musculoskeletal: No suspicious bone lesions identified. IMPRESSION: 1. There is a hypoenhancing mass involving the head of pancreas. There is associated atrophy of the pancreatic neck, body and tail with dilatation of the main duct. Imaging findings are concerning for pancreatic adenocarcinoma. This mass abuts the portal venous confluence without complete encasement. The main portal vein remains patent. No evidence for SMA or celiac involvement. Further evaluation with endoscopic ultrasound with tissue sampling is advised. 2. There are several indeterminate liver lesions identified. The largest is in the caudate lobe measuring 1.8 cm. Although this exhibits delayed internal enhancement (which may be a feature benign hemangioma) underlying liver metastasis cannot be excluded. The additional, smaller, less than 10 mm liver lesions are too small to reliably characterize. In the event biopsy-proven pancreatic adenocarcinoma consider further staging workup with PET-CT. 3. Suspect focal pancreatitis involving the head of pancreas. 4. Interval decompression of the bile ducts status post stent placement. 5. Small filling defect noted within the superior mesenteric vein which may represent thrombus. The portal vein however remains patent. Electronically Signed   By: Kerby Moors M.D.   On: 07/01/2018 16:12      Procedures: ERCP, biopsy, dilatation and sphincterectomy   Subjective: Patient is feeling better, mild epigastric discomfort, but no nausea or vomiting.   Discharge Exam: Vitals:    07/01/18 2113 07/02/18 0459  BP: (!) 156/74 (!) 176/77  Pulse: 66 63  Resp: 20 20  Temp: 98.9 F (37.2 C) 98.2 F (36.8 C)  SpO2: 94% 95%   Vitals:   07/01/18 1307 07/01/18 1837 07/01/18 2113 07/02/18 0459  BP: (!) 197/81 135/81 (!) 156/74 (!) 176/77  Pulse: (!) 51 80 66 63  Resp:   20 20  Temp: 98 F (36.7 C)  98.9 F (37.2 C) 98.2 F (36.8 C)  TempSrc: Oral  Oral Oral  SpO2: 95%  94% 95%  Weight:      Height:        General: Not in pain or dyspnea  Neurology: Awake and alert, non focal  E ENT: no pallor, no icterus, oral mucosa moist Cardiovascular: No JVD. S1-S2 present, rhythmic, no gallops, rubs, or murmurs. No lower extremity edema. Pulmonary: vesicular breath sounds bilaterally, adequate air movement, no wheezing, rhonchi or rales. Gastrointestinal. Abdomen with no organomegaly, non tender, no rebound or guarding Skin. No rashes Musculoskeletal: no joint deformities   The results of significant diagnostics from this hospitalization (including imaging, microbiology, ancillary and laboratory) are listed below for reference.     Microbiology: Recent Results (from the past 240 hour(s))  SARS Coronavirus 2 (CEPHEID - Performed in [place] hospital lab), [facility]  Status: None   Collection Time: 06/30/18  2:29 PM   Specimen: Nasopharyngeal Swab  Result Value Ref Range Status   SARS Coronavirus 2 NEGATIVE NEGATIVE Final    Comment: (NOTE) If result is NEGATIVE SARS-CoV-2 target nucleic acids are NOT DETECTED. The SARS-CoV-2 RNA is generally detectable in upper and lower  respiratory specimens during the acute phase of infection. The lowest  concentration of SARS-CoV-2 viral copies this assay can detect is 250  copies / mL. A negative result does not preclude SARS-CoV-2 infection  and should not be used as the sole basis for treatment or other  patient management decisions.  A negative result may occur with  improper specimen collection / handling,  submission of specimen other  than nasopharyngeal swab, presence of viral mutation(s) within the  areas targeted by this assay, and inadequate number of viral copies  (<250 copies / mL). A negative result must be combined with clinical  observations, patient history, and epidemiological information. If result is POSITIVE SARS-CoV-2 target nucleic acids are DETECTED. The SARS-CoV-2 RNA is generally detectable in upper and lower  respiratory specimens dur ing the acute phase of infection.  Positive  results are indicative of active infection with SARS-CoV-2.  Clinical  correlation with patient history and other diagnostic information is  necessary to determine patient infection status.  Positive results do  not rule out bacterial infection or co-infection with other viruses. If result is PRESUMPTIVE POSTIVE SARS-CoV-2 nucleic acids MAY BE PRESENT.   A presumptive positive result was obtained on the submitted specimen  and confirmed on repeat testing.  While 2019 novel coronavirus  (SARS-CoV-2) nucleic acids may be present in the submitted sample  additional confirmatory testing may be necessary for epidemiological  and / or clinical management purposes  to differentiate between  SARS-CoV-2 and other Sarbecovirus currently known to infect humans.  If clinically indicated additional testing with an alternate test  methodology [phone]) is advised. The SARS-CoV-2 RNA is generally  detectable in upper and lower respiratory sp ecimens during the acute  phase of infection. The expected result is Negative. Fact Sheet for Patients:  StrictlyIdeas.no Fact Sheet for Healthcare Providers: BankingDealers.co.za This test is not yet approved or cleared by the [place] FDA and has been authorized for detection and/or diagnosis of SARS-CoV-2 by FDA under an Emergency Use Authorization (EUA).  This EUA will remain in effect (meaning this test can be  used) for the duration of the COVID-19 declaration under Section 564(b)(1) of the Act, 21 U.S.C. section 360bbb-3(b)(1), unless the authorization is terminated or revoked sooner. Performed at [facility], [place] [address], [place], [place] [address]      Labs: BNP (last 3 results) No results for input(s): BNP in the last 8760 hours. Basic Metabolic Panel: Recent Labs  Lab 06/29/18 1136 06/30/18 1438 07/01/18 0515 07/02/18 0419  NA 139 139 138 139  K 3.7 3.4* 3.8 4.0  CL 100 103 103 103  CO2 27 24 22 24   GLUCOSE 141* 177* 126* 160*  BUN 10 16 15 15   CREATININE 0.72 0.66 0.54 0.73  CALCIUM 9.6 9.1 9.0 9.2   Liver Function Tests: Recent Labs  Lab 06/29/18 1136 06/30/18 1438 07/01/18 0515 07/02/18 0419  AST 256* 193* 193* 66*  ALT 807* 591* 470* 339*  ALKPHOS  --  368* 367* 344*  BILITOT 6.8* 8.0* 7.6* 3.0*  PROT 6.7 7.5 6.5 6.4*  ALBUMIN  --  4.0 3.3* 3.2*   Recent Labs  Lab  06/29/18 1136 06/30/18 1438 07/01/18 0515  LIPASE 140* 76* 60*  AMYLASE 39  --  32   No results for input(s): AMMONIA in the last 168 hours. CBC: Recent Labs  Lab 06/29/18 1136 06/30/18 1438 07/01/18 0515 07/02/18 0419  WBC 6.7 7.2 5.7 9.8  NEUTROABS 4,657  --   --  7.6  HGB 15.1 14.5 13.3 13.8  HCT 44.8 43.2 40.1 42.7  MCV 92.2 94.1 93.5 94.3  PLT 356 311 269 285   Cardiac Enzymes: No results for input(s): CKTOTAL, CKMB, CKMBINDEX, TROPONINI in the last 168 hours. BNP: Invalid input(s): POCBNP CBG: No results for input(s): GLUCAP in the last 168 hours. D-Dimer No results for input(s): DDIMER in the last 72 hours. Hgb A1c No results for input(s): HGBA1C in the last 72 hours. Lipid Profile No results for input(s): CHOL, HDL, LDLCALC, TRIG, CHOLHDL, LDLDIRECT in the last 72 hours. Thyroid function studies No results for input(s): TSH, T4TOTAL, T3FREE, THYROIDAB in the last 72 hours.  Invalid input(s): FREET3 Anemia work up No results for input(s):  VITAMINB12, FOLATE, FERRITIN, TIBC, IRON, RETICCTPCT in the last 72 hours. Urinalysis    Component Value Date/Time   COLORURINE AMBER (A) 06/30/2018 1553   APPEARANCEUR CLEAR 06/30/2018 1553   LABSPEC 1.024 06/30/2018 1553   PHURINE 5.0 06/30/2018 1553   GLUCOSEU NEGATIVE 06/30/2018 1553   HGBUR LARGE (A) 06/30/2018 1553   BILIRUBINUR MODERATE (A) 06/30/2018 1553   KETONESUR 5 (A) 06/30/2018 1553   PROTEINUR NEGATIVE 06/30/2018 1553   UROBILINOGEN 0.2 05/30/2013 0903   NITRITE NEGATIVE 06/30/2018 1553   LEUKOCYTESUR NEGATIVE 06/30/2018 1553   Sepsis Labs Invalid input(s): PROCALCITONIN,  WBC,  LACTICIDVEN Microbiology Recent Results (from the past 240 hour(s))  SARS Coronavirus 2 (CEPHEID - Performed in [place] hospital lab), Hosp Order     Status: None   Collection Time: 06/30/18  2:29 PM   Specimen: Nasopharyngeal Swab  Result Value Ref Range Status   SARS Coronavirus 2 NEGATIVE NEGATIVE Final    Comment: (NOTE) If result is NEGATIVE SARS-CoV-2 target nucleic acids are NOT DETECTED. The SARS-CoV-2 RNA is generally detectable in upper and lower  respiratory specimens during the acute phase of infection. The lowest  concentration of SARS-CoV-2 viral copies this assay can detect is 250  copies / mL. A negative result does not preclude SARS-CoV-2 infection  and should not be used as the sole basis for treatment or other  patient management decisions.  A negative result may occur with  improper specimen collection / handling, submission of specimen other  than nasopharyngeal swab, presence of viral mutation(s) within the  areas targeted by this assay, and inadequate number of viral copies  (<250 copies / mL). A negative result must be combined with clinical  observations, patient history, and epidemiological information. If result is POSITIVE SARS-CoV-2 target nucleic acids are DETECTED. The SARS-CoV-2 RNA is generally detectable in upper and lower  respiratory specimens  dur ing the acute phase of infection.  Positive  results are indicative of active infection with SARS-CoV-2.  Clinical  correlation with patient history and other diagnostic information is  necessary to determine patient infection status.  Positive results do  not rule out bacterial infection or co-infection with other viruses. If result is PRESUMPTIVE POSTIVE SARS-CoV-2 nucleic acids MAY BE PRESENT.   A presumptive positive result was obtained on the submitted specimen  and confirmed on repeat testing.  While 2019 novel coronavirus  (SARS-CoV-2) nucleic acids may be present in the  submitted sample  additional confirmatory testing may be necessary for epidemiological  and / or clinical management purposes  to differentiate between  SARS-CoV-2 and other Sarbecovirus currently known to infect humans.  If clinically indicated additional testing with an alternate test  methodology [phone]) is advised. The SARS-CoV-2 RNA is generally  detectable in upper and lower respiratory sp ecimens during the acute  phase of infection. The expected result is Negative. Fact Sheet for Patients:  StrictlyIdeas.no Fact Sheet for Healthcare Providers: BankingDealers.co.za This test is not yet approved or cleared by the [place] FDA and has been authorized for detection and/or diagnosis of SARS-CoV-2 by FDA under an Emergency Use Authorization (EUA).  This EUA will remain in effect (meaning this test can be used) for the duration of the COVID-19 declaration under Section 564(b)(1) of the Act, 21 U.S.C. section 360bbb-3(b)(1), unless the authorization is terminated or revoked sooner. Performed at [facility], [place] [address] [place], [place] [id number]      Time coordinating discharge: 45 minutes  SIGNED:   Tawni Millers, MD  [facility] 07/02/2018, 11:47 AM

## 2018-07-02 NOTE — Progress Notes (Signed)
Progress Note   Subjective  Mild intermittent epigastric pain is unchanged, otherwise feels well   Objective  Vital signs in last 24 hours: Temp:  [97.3 F (36.3 C)-98.9 F (37.2 C)] 98.2 F (36.8 C) (06/20 0459) Pulse Rate:  [51-81] 63 (06/20 0459) Resp:  [11-20] 20 (06/20 0459) BP: (135-197)/(74-93) 176/77 (06/20 0459) SpO2:  [94 %-100 %] 95 % (06/20 0459) Last BM Date: 07/01/18  General: Alert, well-developed, in NAD Heart:  Regular rate and rhythm; no murmurs Chest: Clear to ascultation bilaterally Abdomen:  Soft, nontender and nondistended. Normal bowel sounds, without guarding, and without rebound.   Extremities:  Without edema. Neurologic:  Alert and  oriented x4; grossly normal neurologically. Psych:  Alert and cooperative. Normal mood and affect.  Intake/Output from previous day: 06/19 0701 - 06/20 0700 In: 2126.4 [P.O.:1180; I.V.:750; IV Piggyback:196.4] Out: 650 [Urine:650] Intake/Output this shift: Total I/O In: 338.9 [P.O.:300; IV Piggyback:38.9] Out: 0   Lab Results: Recent Labs    06/30/18 1438 07/01/18 0515 07/02/18 0419  WBC 7.2 5.7 9.8  HGB 14.5 13.3 13.8  HCT 43.2 40.1 42.7  PLT 311 269 285   BMET Recent Labs    06/30/18 1438 07/01/18 0515 07/02/18 0419  NA 139 138 139  K 3.4* 3.8 4.0  CL 103 103 103  CO2 24 22 24   GLUCOSE 177* 126* 160*  BUN 16 15 15   CREATININE 0.66 0.54 0.73  CALCIUM 9.1 9.0 9.2   LFT Recent Labs    07/02/18 0419  PROT 6.4*  ALBUMIN 3.2*  AST 66*  ALT 339*  ALKPHOS 344*  BILITOT 3.0*   PT/INR Recent Labs    07/01/18 0515  LABPROT 12.4  INR 0.9   Hepatitis Panel No results for input(s): HEPBSAG, HCVAB, HEPAIGM, HEPBIGM in the last 72 hours.  Studies/Results: Mr 3d Recon At Scanner  Result Date: 07/01/2018 CLINICAL DATA:  Biliary ductal dilatation status post ERCP with stent placement. EXAM: MRI ABDOMEN WITHOUT AND WITH CONTRAST (INCLUDING MRCP) TECHNIQUE: Multiplanar multisequence MR  imaging of the abdomen was performed both before and after the administration of intravenous contrast. Heavily T2-weighted images of the biliary and pancreatic ducts were obtained, and three-dimensional MRCP images were rendered by post processing. CONTRAST:  9 cc Gadavist COMPARISON:  06/20/2018 FINDINGS: Lower chest: No acute findings. Hepatobiliary: There is been interval decompression of the dilated bile ducts status post stent placement. Within the caudate lobe of liver there is a 1.8 cm T1 hyperintense and T2 hypointense liver lesion. This demonstrates some mild delayed internal enhancement. This is indeterminate. Adjacent low T1 signal intensity and increased T1 signal intensity structure measures 0.6 cm, image 18/3. No scratch set this exhibits relative hypoenhancement compared to the adjacent liver. A third T2 hyperintense and T1 hypointense structure is identified within segment 5 measuring 0.9 cm. This is too small to reliably characterize. Pancreas: Atrophy of the neck, body and tail of pancreas is identified with increase caliber of the main duct measuring up to 6 mm. The pancreatic parenchymal atrophy demonstrates progression when compared with exam from 02/27/2018. The main duct dilatation is new. Within the head of pancreas there is a mass which is hypoenhancing to the adjacent pancreas measuring 2 by 1.7 by 2.4 cm. This mass abuts the portal venous confluence without definite evidence for encasement. Small filling defect within the superior mesenteric vein measures 0.6 cm, image 52/2. There does not appear to be involvement of the superior mesenteric artery or celiac trunk. There is mild  soft tissue stranding surrounding the head of pancreas and extending around the duodenum is noted concerning for superimposed pancreatitis. Spleen:  Within normal limits in size and appearance. Adrenals/Urinary Tract: Normal appearance of the adrenal glands. The kidneys are unremarkable. No kidney mass or  hydronephrosis. Stomach/Bowel: Lap band noted around the proximal stomach. Visualized small and large bowel loops are unremarkable. Vascular/Lymphatic: Normal appearance of the abdominal aorta. No aneurysm. No adenopathy identified. Other: Small amount of free fluid arising from the area around the head of pancreas noted. No focal fluid collections. Musculoskeletal: No suspicious bone lesions identified. IMPRESSION: 1. There is a hypoenhancing mass involving the head of pancreas. There is associated atrophy of the pancreatic neck, body and tail with dilatation of the main duct. Imaging findings are concerning for pancreatic adenocarcinoma. This mass abuts the portal venous confluence without complete encasement. The main portal vein remains patent. No evidence for SMA or celiac involvement. Further evaluation with endoscopic ultrasound with tissue sampling is advised. 2. There are several indeterminate liver lesions identified. The largest is in the caudate lobe measuring 1.8 cm. Although this exhibits delayed internal enhancement (which may be a feature benign hemangioma) underlying liver metastasis cannot be excluded. The additional, smaller, less than 10 mm liver lesions are too small to reliably characterize. In the event biopsy-proven pancreatic adenocarcinoma consider further staging workup with PET-CT. 3. Suspect focal pancreatitis involving the head of pancreas. 4. Interval decompression of the bile ducts status post stent placement. 5. Small filling defect noted within the superior mesenteric vein which may represent thrombus. The portal vein however remains patent. Electronically Signed   By: Kerby Moors M.D.   On: 07/01/2018 16:12   Dg Ercp  Result Date: 07/01/2018 CLINICAL DATA:  68 year old female with a history of choledocholithiasis EXAM: ERCP TECHNIQUE: Multiple spot images obtained with the fluoroscopic device and submitted for interpretation post-procedure. FLUOROSCOPY TIME:  Fluoroscopy  Time:  3 minutes 15 seconds COMPARISON:  None. FINDINGS: Multiple intraoperative fluoroscopic spot images during ERCP. Initial image demonstrates endoscope projecting over the upper abdomen. Overlying reservoir and tubing for gastric banding. There is then cannulation of the ampulla and retrograde infusion of contrast partially opacifying the extrahepatic biliary ducts. Placement of a plastic biliary stent. IMPRESSION: Limited images during ERCP demonstrates placement of plastic biliary stent in the common bile duct. Please refer to the dictated operative report for full details of intraoperative findings and procedure. Electronically Signed   By: Corrie Mckusick D.O.   On: 07/01/2018 12:34   Mr Abdomen Mrcp Moise Boring Contast  Result Date: 07/01/2018 CLINICAL DATA:  Biliary ductal dilatation status post ERCP with stent placement. EXAM: MRI ABDOMEN WITHOUT AND WITH CONTRAST (INCLUDING MRCP) TECHNIQUE: Multiplanar multisequence MR imaging of the abdomen was performed both before and after the administration of intravenous contrast. Heavily T2-weighted images of the biliary and pancreatic ducts were obtained, and three-dimensional MRCP images were rendered by post processing. CONTRAST:  9 cc Gadavist COMPARISON:  06/20/2018 FINDINGS: Lower chest: No acute findings. Hepatobiliary: There is been interval decompression of the dilated bile ducts status post stent placement. Within the caudate lobe of liver there is a 1.8 cm T1 hyperintense and T2 hypointense liver lesion. This demonstrates some mild delayed internal enhancement. This is indeterminate. Adjacent low T1 signal intensity and increased T1 signal intensity structure measures 0.6 cm, image 18/3. No scratch set this exhibits relative hypoenhancement compared to the adjacent liver. A third T2 hyperintense and T1 hypointense structure is identified within segment 5 measuring 0.9  cm. This is too small to reliably characterize. Pancreas: Atrophy of the neck, body and  tail of pancreas is identified with increase caliber of the main duct measuring up to 6 mm. The pancreatic parenchymal atrophy demonstrates progression when compared with exam from 02/27/2018. The main duct dilatation is new. Within the head of pancreas there is a mass which is hypoenhancing to the adjacent pancreas measuring 2 by 1.7 by 2.4 cm. This mass abuts the portal venous confluence without definite evidence for encasement. Small filling defect within the superior mesenteric vein measures 0.6 cm, image 52/2. There does not appear to be involvement of the superior mesenteric artery or celiac trunk. There is mild soft tissue stranding surrounding the head of pancreas and extending around the duodenum is noted concerning for superimposed pancreatitis. Spleen:  Within normal limits in size and appearance. Adrenals/Urinary Tract: Normal appearance of the adrenal glands. The kidneys are unremarkable. No kidney mass or hydronephrosis. Stomach/Bowel: Lap band noted around the proximal stomach. Visualized small and large bowel loops are unremarkable. Vascular/Lymphatic: Normal appearance of the abdominal aorta. No aneurysm. No adenopathy identified. Other: Small amount of free fluid arising from the area around the head of pancreas noted. No focal fluid collections. Musculoskeletal: No suspicious bone lesions identified. IMPRESSION: 1. There is a hypoenhancing mass involving the head of pancreas. There is associated atrophy of the pancreatic neck, body and tail with dilatation of the main duct. Imaging findings are concerning for pancreatic adenocarcinoma. This mass abuts the portal venous confluence without complete encasement. The main portal vein remains patent. No evidence for SMA or celiac involvement. Further evaluation with endoscopic ultrasound with tissue sampling is advised. 2. There are several indeterminate liver lesions identified. The largest is in the caudate lobe measuring 1.8 cm. Although this  exhibits delayed internal enhancement (which may be a feature benign hemangioma) underlying liver metastasis cannot be excluded. The additional, smaller, less than 10 mm liver lesions are too small to reliably characterize. In the event biopsy-proven pancreatic adenocarcinoma consider further staging workup with PET-CT. 3. Suspect focal pancreatitis involving the head of pancreas. 4. Interval decompression of the bile ducts status post stent placement. 5. Small filling defect noted within the superior mesenteric vein which may represent thrombus. The portal vein however remains patent. Electronically Signed   By: Kerby Moors M.D.   On: 07/01/2018 16:12      Assessment & Recommendations   1. Pancreatic mass with biliary obstruction concerning for pancreatic cancer. LFTs improving following biliary stent placement. Awaiting CBD brush cytology results which are expected Monday. Outpatient EUS likely the next step which will be arranged. DC Zosyn without evidence of infection. Advance diet. OK for discharge home today from GI standpoint with outpatient GI follow up with Dr. Silverio Decamp.     LOS: 1 day   Pricilla Riffle. Fuller Plan  MD 07/02/2018, 11:25 AM

## 2018-07-02 NOTE — Progress Notes (Signed)
Nurse reviewed discharge instructions with pt. Pt verbalized understanding of discharge orders and follow up appointments.  No concerns at time of discharge.

## 2018-07-04 ENCOUNTER — Telehealth: Payer: Self-pay | Admitting: *Deleted

## 2018-07-04 ENCOUNTER — Encounter (HOSPITAL_COMMUNITY): Payer: Self-pay | Admitting: Gastroenterology

## 2018-07-04 NOTE — Telephone Encounter (Signed)
Called patient on 07/04/2018 , 4:39 PM in an attempt to reach the patient for a hospital follow up.   Admit date: 06/30/18 Discharge: 07/02/18   She does not have any questions or concerns about medications from the hospital admission. The patient's medications were reviewed over the phone, they were counseled to bring in all current medications to the hospital follow up visit. Patient has a phone visit with Liane Comber, NP on 07/08/2018.  I advised the patient to call if any questions or concerns arise about the hospital admission or medications    Home health was not started in the hospital.  All questions were answered and a follow up appointment was made.   Prior to Admission medications   Medication Sig Start Date End Date Taking? Authorizing Provider  acidophilus (RISAQUAD) CAPS Take 1 capsule by mouth daily after supper. Reported on 02/19/2015    [provider]  anastrozole (ARIMIDEX) 1 MG tablet TAKE 1 TABLET BY MOUTH EVERY DAY Patient taking differently: Take 1 mg by mouth daily.  03/21/18   Gardenia Phlegm, NP  aspirin EC 81 MG tablet Take 81 mg by mouth daily.    [provider]  atenolol (TENORMIN) 100 MG tablet TAKE 1 TABLET BY MOUTH EVERY DAY Patient taking differently: Take 50 mg by mouth daily after supper.  03/19/18   Unk Pinto, MD  b complex vitamins capsule Take 1 capsule by mouth daily after supper.     [provider]  BIOTIN PO Take 1 capsule by mouth daily after supper.    [provider]  calcium carbonate (TUMS) 500 MG chewable tablet Chew 1,000 mg by mouth daily after supper.     [provider]  Cholecalciferol (VITAMIN D3) 5000 UNITS CAPS Take 5,000 Units by mouth daily after supper.     [provider]  Cinnamon 500 MG TABS Take 500 mg by mouth daily after supper.     [provider]  Magnesium 250 MG TABS Take 250 mg by mouth 2 (two) times daily.    [provider]  Multiple  Vitamins-Minerals (MULTIVITAMIN WITH MINERALS) tablet Take 1 tablet by mouth 2 (two) times daily.    [provider]  Omega-3 Fatty Acids (FISH OIL) 1000 MG CAPS Take 1,000 mg by mouth daily after supper.     [provider]  omeprazole (PRILOSEC) 20 MG capsule Take 1 capsule (20 mg total) by mouth 2 (two) times daily before a meal for 14 days. 14 days 06/29/18 07/13/18  Liane Comber, NP  ondansetron (ZOFRAN) 4 MG tablet Take 1 tablet (4 mg total) by mouth every 8 (eight) hours as needed for nausea or vomiting. 02/27/18   Rolland Porter, MD  OVER THE COUNTER MEDICATION Take 1 tablet by mouth 2 (two) times daily. Tumeric with black pepper    [provider]  promethazine (PHENERGAN) 25 MG tablet Take 1/2-1 tab every 6 hours as needed for nausea. Patient taking differently: Take 12.5-25 mg by mouth every 6 (six) hours as needed for nausea.  06/29/18   Liane Comber, NP  rosuvastatin (CRESTOR) 40 MG tablet Take 1 tablet daily for Cholesterol Patient taking differently: Take 40 mg by mouth daily.  03/19/18   Unk Pinto, MD  traZODone (DESYREL) 150 MG tablet Take 1/2 to 1 tablet 1 to 2 hours before Bedtime Patient taking differently: Take 150 mg by mouth.  06/01/18   Unk Pinto, MD  verapamil (CALAN) 80 MG tablet Take 1 tablet 2 x /  day with meal for BP Patient taking differently: Take 80 mg by mouth 2 (two) times daily with a meal.  02/14/18   Unk Pinto, MD  Zinc 50 MG TABS Take 50 mg by mouth daily after supper.    [provider]

## 2018-07-05 ENCOUNTER — Telehealth: Payer: Self-pay

## 2018-07-05 ENCOUNTER — Other Ambulatory Visit: Payer: Self-pay

## 2018-07-05 ENCOUNTER — Telehealth: Payer: Self-pay | Admitting: Gastroenterology

## 2018-07-05 DIAGNOSIS — K831 Obstruction of bile duct: Secondary | ICD-10-CM

## 2018-07-05 NOTE — Telephone Encounter (Signed)
Dr. Silverio Decamp, is this patient to be scheduled for an EGD with you? No office notes/visit for reference.

## 2018-07-05 NOTE — Telephone Encounter (Signed)
She is already scheduled for EGD with EUS by Dr. Ardis Hughs and Chong Sicilian informed the patient.  Please check the notes thank you

## 2018-07-05 NOTE — Telephone Encounter (Signed)
Patient called said that someone would call her to schedule a endo w-biopsy after her procedure that she had at [facility]. By Dr. Fuller Plan

## 2018-07-05 NOTE — Telephone Encounter (Signed)
Notes checked now and previously. No previous office notes for reference. EGD with EUS with Dr. Ardis Hughs scheduled by Chong Sicilian after telephone encounter started at 9:05 am. Patty notified patient.

## 2018-07-05 NOTE — Telephone Encounter (Signed)
See alternate phone note  

## 2018-07-05 NOTE — Telephone Encounter (Signed)
-----   Message from Irving Copas., MD sent at 07/05/2018 12:07 PM EDT ----- Regarding: Follow up Meriel Kelliher, Please proceed with scheduling EUS with [facility] next available with Linna Hoff or myself. Next Wednesday 7/1, if [facility] is OK to have a patient after my 2nd patient then that would work or if Linna Hoff has any availability (I looked at our schedules this week and it looks pretty tight). Thanks. Gabe ----- Message ----- From: Ladene Artist, MD Sent: 07/05/2018  11:51 AM EDT To: Milus Banister, MD, #  CBD stricture cytology suspicious for adenocarcinoma. Proceed with EUS, FNA?  ----- Message ----- From: Irving Copas., MD Sent: 07/02/2018   5:09 AM EDT To: Milus Banister, MD, Ladene Artist, MD  Norberto Sorenson, Thanks for the note. Please keep [place] UTD on the final brushing results. And we can move forward from there. Thanks. GM ----- Message ----- From: Ladene Artist, MD Sent: 07/01/2018   6:35 PM EDT To: Milus Banister, MD, #  Linna Hoff and Chester Holstein,  Please review for EUS with whoever has availability. Very nice 68 year old female known to [facility] who presented with obstructive jaundice. MRI, ERCP findings c/w pancreatic cancer. CBD brushings are pending. Currently an inpatient at [facility] but likely will go home over the weekend. Consider EUS for staging and, if brushings are negative, FNA.   Thanks,   Norberto Sorenson

## 2018-07-05 NOTE — Patient Outreach (Signed)
[facility]) Care Management  07/05/2018  DANIKAH BUDZIK 1950-12-29 [id number]  EMMI: general discharge red alert Referral date: 07/05/18  Referral reason: scheduled follow up: no Insurance: [place] health care Day # 1   Telephone call to patient regarding EMMI general discharge red alert. HIPAA verified by patient. RNCM introduced herself and explained reason for call.  Patient states she has a virtual appointment with her primary MD on 6/ 26/20.  Patient states she contacted Dr. Lynne Leader office and is waiting to hear back from them to schedule an appointment for further testing. Patient states she has transportation to her appointments.  Patient states she is taking her medications as prescribed. Denies having any new symptoms since discharge. Patient states she is still having some mid abdominal pain and tylenol is helping with this.  Patient reports her pain level today is a 3 out of 10.  Patient denies any further needs or concerns at this time.   RNCM advised patient to notify MD of any changes in condition prior to scheduled appointment. RNCM provided contact name and number: 24 hour nurse advise line [phone].  RNCM verified patient aware of 911 services for urgent/ emergent needs. RNCM discussed COVID 19 precautions and symptoms.  Advised patient to contact her doctor for minor symptoms. If symptoms are more severe call 911.  Patient verbalized understanding.   PLAN: RNCM will close patient due to patient being assessed and having no further needs.   Quinn Plowman RN,BSN,CCM [facility] Telephonic  [phone]

## 2018-07-05 NOTE — Telephone Encounter (Signed)
Made introductory call to Taylor Oconnor to advise of initial consult with Dr. Burr Medico on 7/2 @ 2:30 PM. Patient aware of visitor restrictions and the need to arrive 15 minutes to register. Information from [facility], [place] virtual GI support group, Navigation, [place] resources and contact information mailed to patient.

## 2018-07-05 NOTE — Telephone Encounter (Signed)
-----   Message from Taylor Banister, MD sent at 07/05/2018 12:50 PM EDT ----- Chong Sicilian, I believe Taylor Oconnor told you to keep her on the current schedule for next Thursday EUS with me.  I agree with that.  She will need to be COVID tested as per protocol.  If we decide not to go ahead with the case based on Wednesdays GI cancer conference then we can cancel it.   ----- Message ----- From: Taylor Artist, MD Sent: 07/05/2018  12:40 PM EDT To: Taylor Banister, MD, Algernon Huxley, RN, #  OK. Will await recommendations after oncology conference. MS ----- Message ----- From: Taylor Banister, MD Sent: 07/05/2018  12:12 PM EDT To: Taylor Artist, MD, Taylor Snipe, RN, #  Taylor Oconnor, In her clinical setting with a mass in the head of the pancreas that appears suspicious on imaging and bile duct stricture, I am not sure that she will need repeat biopsy.  This may be 'good enough' for oncology to make treatment decisions.  The mass on imaging directly abuts the portal venous confluence and there were also some possible lesions in the liver and so I do not think endoscopic ultrasound will be necessary for local staging either.  If you could arrange referral to medical oncology as well as surgical oncology, Dr. Barry Oconnor, I will ask that her case to be put on for next week's GI cancer conference and at that point we will make a decision if more tissue is necessary.  Thanks  Taylor Oconnor, Can you please add her case to next week's GI cancer conference?  Mass in pancreas, likely adenocarcinoma.  Thanks  DJ ----- Message ----- From: Taylor Artist, MD Sent: 07/05/2018  11:51 AM EDT To: Taylor Banister, MD, #  CBD stricture cytology suspicious for adenocarcinoma. Proceed with EUS, FNA?  ----- Message ----- From: Taylor Oconnor., MD Sent: 07/02/2018   5:09 AM EDT To: Taylor Banister, MD, Taylor Artist, MD  Taylor Oconnor, Thanks for the note. Please keep [place] UTD on the final brushing results. And we can move  forward from there. Thanks. GM ----- Message ----- From: Taylor Artist, MD Sent: 07/01/2018   6:35 PM EDT To: Taylor Banister, MD, #  Taylor Oconnor and Taylor Oconnor,  Please review for EUS with whoever has availability. Very nice 68 year old female known to [facility] who presented with obstructive jaundice. MRI, ERCP findings c/w pancreatic cancer. CBD brushings are pending. Currently an inpatient at [facility] but likely will go home over the weekend. Consider EUS for staging and, if brushings are negative, FNA.   Thanks,   Taylor Oconnor

## 2018-07-05 NOTE — Telephone Encounter (Signed)
EUS 7/2 830 am [facility] with Dr Ardis Hughs  Covid testing 6/29  EUS scheduled, pt instructed and medications reviewed.  Patient instructions mailed to home.  Patient to call with any questions or concerns.

## 2018-07-06 ENCOUNTER — Telehealth: Payer: Self-pay

## 2018-07-06 NOTE — Telephone Encounter (Signed)
Taylor Oconnor, Keep her on schedule for the July 2 EUS appointment.  It might be canceled depending on how the GI cancer conference on July 1 goes.  Thanks and sorry for all the confusion

## 2018-07-06 NOTE — Telephone Encounter (Signed)
----- Message from Taylor Lasso, RN sent at 07/05/2018 12:37 PM EDT ----- Regarding: FW: Follow up  ----- Message ----- From: Taylor Oconnor., MD Sent: 07/05/2018  12:30 PM EDT To: Taylor Lasso, RN Subject: RE: Follow up                                  I think that is OK. I would, though, go ahead and call and let her know that we are going to attempt to have her case discussed at [facility] this week or next. There is a chance she may not need another biopsy attempt if the decision is felt that she does not need this. I think, keeping it on for now is reasonable, but let her know that we are working on this and that we will finalize a decision hopefully by tomorrow. However, if we can't get her on the discussion board, we won't know the results of Taylor Oconnor until next Wednesday. That could make things for COVID testing weird but we can see. Hopefully she can be added on for tomorrow. Check in with Taylor Oconnor and I about the results of this discussion tomorrow. Thanks. Taylor Oconnor ----- Message ----- From: Taylor Lasso, RN Sent: 07/05/2018  12:26 PM EDT To: Taylor Oconnor., MD Subject: RE: Follow up                                  She has already been scheduled for 7/2 with Dr Ardis Hughs and I spoke with her with instructions. Can I leave it as scheduled and cancel if needed. ----- Message ----- From: Taylor Oconnor., MD Sent: 07/05/2018  12:17 PM EDT To: Taylor Banister, MD, Taylor Artist, MD, # Subject: Melton Alar: Follow up                                  Taylor Oconnor has replied to Taylor Oconnor and will have patient put on for conference. Hold on scheduling EUS. Thanks. Taylor Oconnor ----- Message ----- From: Taylor Oconnor., MD Sent: 07/05/2018  12:07 PM EDT To: Taylor Banister, MD, Taylor Artist, MD, # Subject: Follow up                                      Taylor Oconnor, Please proceed with scheduling EUS with [facility] next available with Taylor Oconnor or myself. Next Wednesday 7/1, if [facility] is OK to have  a patient after my 2nd patient then that would work or if Taylor Oconnor has any availability (I looked at our schedules this week and it looks pretty tight). Thanks. Taylor Oconnor ----- Message ----- From: Taylor Artist, MD Sent: 07/05/2018  11:51 AM EDT To: Taylor Banister, MD, #  CBD stricture cytology suspicious for adenocarcinoma. Proceed with EUS, FNA?  ----- Message ----- From: Taylor Oconnor., MD Sent: 07/02/2018   5:09 AM EDT To: Taylor Banister, MD, Taylor Artist, MD  Taylor Oconnor, Thanks for the note. Please keep [place] UTD on the final brushing results. And we can move forward from there. Thanks. Taylor Oconnor ----- Message ----- From: Taylor Artist, MD Sent: 07/01/2018   6:35 PM EDT To: Taylor Banister, MD, #  Taylor Oconnor and  Taylor Oconnor,  Please review for EUS with whoever has availability. Very nice 68 year old female known to [facility] who presented with obstructive jaundice. MRI, ERCP findings c/w pancreatic cancer. CBD brushings are pending. Currently an inpatient at [facility] but likely will go home over the weekend. Consider EUS for staging and, if brushings are negative, FNA.   Thanks,   Taylor Oconnor

## 2018-07-06 NOTE — Telephone Encounter (Signed)
Dr Rush Landmark or Dr Ardis Hughs please advise about the pt appt for 7/2

## 2018-07-07 NOTE — Progress Notes (Signed)
Hospital follow up  Assessment and Plan: Hospital visit follow up for:  Diagnoses and all orders for this visit:  Common bile duct obstruction/ Pancreatic mass Following closely with GI; pending biopsy and tumor board 07/13/2018 has UES scheduled for 07/14/2018 Discussed at length; patient has been well informed by GI;  she  is maintaining good spirits; husband is present and supportive;  She is managing her pain fairly with tylenol sticking primarily to liquid/soft diet and zofran PRN; does have early satiety and somewhat poor appetite May add miralax if needed for constipation Encouraged her to reach out for any needs; none identified at this time Will keep close follow up in 1 month   Hypokalemia Resolved at discharge; monitor CMPs, will be getting at preop labs, no high risk medications  Elevated LFTs Will continue to follow; has upcoming admission and will be getting preop labs, etc NO need to schedule sooner   All medications were reviewed with patient and family and fully reconciled. All questions answered fully, and patient and family members were encouraged to call the office with any further questions or concerns.   Medications Discontinued During This Encounter  Medication Reason  . b complex vitamins capsule   . BIOTIN PO   . calcium carbonate (TUMS) 500 MG chewable tablet   . Cinnamon 500 MG TABS   . Magnesium 250 MG TABS   . Multiple Vitamins-Minerals (MULTIVITAMIN WITH MINERALS) tablet   . Omega-3 Fatty Acids (FISH OIL) 1000 MG CAPS   . OVER THE COUNTER MEDICATION   . omeprazole (PRILOSEC) 20 MG capsule   . acidophilus (RISAQUAD) CAPS    Over 40 minutes of exam, counseling, chart review, and complex, high/moderate level critical decision making was performed this visit.   Future Appointments  Date Time Provider [place]  07/11/2018 10:10 AM [facility] None  07/14/2018  2:30 PM Truitt Merle, MD [facility] None  08/10/2018  3:45 PM Liane Comber,  NP [facility] None  11/07/2018  2:30 PM Unk Pinto, MD [facility] None  01/19/2019  3:00 PM Vicie Mutters, PA-C [facility] None  01/26/2019  9:00 AM [facility] LAB 1 [facility] None  01/26/2019  9:30 AM Magrinat, Virgie Dad, MD [facility] None  05/08/2019  2:00 PM Unk Pinto, MD [facility] None    HPI 68 y.o.female presents for follow up for transition from recent hospitalization or [facility] stay. Admit date to the hospital was 06/30/18, patient was discharged from the hospital on 07/02/18 and our clinical staff contacted the office the day after discharge to set up a follow up appointment. The discharge summary, medications, and diagnostic test results were reviewed before meeting with the patient. The patient was admitted for:   Recommendations for Outpatient Follow-up and new medication changes:  1. Follow up with Dr. Melford Aase in 7 days. 2. Follow with the GI clinic as scheduled, for biopsy result and further diagnostics.    Brief/Interim Summary: 68 yo femalewho presented to ED per our instructions with abdominal pain and concerning lab and imaging findings.She does have significant past medical history for hypertension, dyslipidemia and breast cancer. Reported epigastric abdominal pain with radiation to her back for about 5 days, associated with mild nausea, no vomiting or diarrhea. She was seen by myself the day prior, her liver enzymes were noted to be elevated,further work-up with CT of the abdomen showed dilatation of the bile ducts and possible pancreatic head neoplasm. Her GI office was consulted due to recommendations for ERCP/MRCP and they recommended she present to the hospital  for further evaluation. On her initial physical examination blood pressure 156/88, heart rate 73, respiratory rate16, oxygen saturation 98%,no jaundice or pallor,her lungs were clear to auscultation bilaterally, heart S1-S2 present and rhythmic, abdomen was soft nontender, no lower extremity edema.   Sodium 138, potassium 3.4, chloride 103, bicarb 24, glucose 177, BUN 16, creatinine 1.66, alkaline phosphatase 368, lipase 76, AST 193, ALT 591, total bilirubins 8.0, white count 7.2, hemoglobin 14.5, hematocrit 43.2, platelets 311.  SARS COVID-19 negative, urine analysis negative for infection.  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  1.  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  Patient was admitted to the medical ward, she received IV fluids, po antiacids, empiric IV antibiotics and as needed IV antiemetics.  Further work-up with ERCP showed a single severe lower third of the common bile duct stricture, patient underwent biopsy, dilatation, sphincterectomy and stent placement.  Abdominal MRI with hypoenhancing mass involving the head of the pancreas, associated  atrophy of the pancreatic neck, body and tail with dilatation of the main duct.  Images are concerning for pancreatic adenocarcinoma.  The main portal vein was patent.  Patient waw to follow-up with the GI clinic as an outpatient for biopsy results and further work-up.    Patient may need endoscopic ultrasound as an outpatient, this has tentatively been scheduled for 07/14/2018 by Dr. Ardis Hughs but will be pending presentation to tumor board the day prior per GI communications.   Discharge LFTs, bili and alk phos were trending down:  Lab Results  Component Value Date   ALT 339 (H) 07/02/2018   AST 66 (H) 07/02/2018   ALKPHOS 344 (H) 07/02/2018   BILITOT 3.0 (H) 07/02/2018   Discharge CBC was normal:  Lab Results  Component Value Date   WBC 9.8 07/02/2018   HGB 13.8 07/02/2018   HCT 42.7 07/02/2018   MCV 94.3 07/02/2018   PLT 285 07/02/2018    2.  Hypertension.  Continue blood pressure control with atenolol and verapamil.  3.  Dyslipidemia.  Patient will continue statin therapy.  4.  Breast cancer.  Patient will continue anastrozole.   5.  Obesity.  Her  calculated BMI is 32.  6.  Transient hypokalemia.  It was corrected with potassium chloride, her discharge potassium is 4.0, preserved kidney function with a serum creatinine of 0.73 and a bicarbonate of 24.    07/08/2018 notes:   She reports a constant pain, epigastric/RUQ, still radiating to back 6-7/10 without medication, 2/10 with tylenol (325 mg x 2, taking q4-5 hours and reports well managed with this), she has some mild nausea well managed by zofran, has appetite but with early satiety and gets full afater a few bites, sticking to juices, broths for the most part. She does endorse some firm stools, mild constipation, will start miralax if needed.   She demonstrates a good understanding of current plan, and is keeping her spirits up and not letting herself get down until she has more information. Her husband is with her and very supportive, will be coming with her to all appointments. She has no concerns at this time.   Home health is not involved.   Images while in the hospital: Ct Abdomen Pelvis Wo Contrast  Result Date: 06/30/2018 CLINICAL DATA:  Abdominal pain and swelling, decreased appetite. Abnormal lab work. EXAM: CT ABDOMEN AND PELVIS WITHOUT CONTRAST TECHNIQUE: Multidetector CT imaging of the abdomen and pelvis was performed following the standard protocol without  IV contrast. COMPARISON:  CT abdomen dated 02/27/2018. FINDINGS: Lower chest: No acute abnormality. Hepatobiliary: New common bile duct dilatation and intrahepatic bile duct dilatation. CBD measures 1.7 cm diameter. No focal liver abnormality. Status post cholecystectomy. Pancreas: New fullness of the pancreatic head. No pancreatic ductal dilatation seen. Spleen: Normal in size without focal abnormality. Adrenals/Urinary Tract: Adrenal glands appear normal. Kidneys are unremarkable without mass, stone or hydronephrosis. No ureteral or bladder calculi identified. Bladder is decompressed. Stomach/Bowel: No dilated large or  small bowel loops. No evidence of bowel wall inflammation. Status post appendectomy. Lap band appears stable in position/configuration without evidence of complicating feature. Stomach otherwise unremarkable. Vascular/Lymphatic: No significant vascular findings are present. No enlarged abdominal or pelvic lymph nodes. Reproductive: Uterus and bilateral adnexa are unremarkable. Other: No free fluid or abscess collection seen. No free intraperitoneal air. Musculoskeletal: No acute or suspicious osseous finding. Mild degenerative spondylosis of the lumbar spine. IMPRESSION: 1. New common bile duct dilatation and intrahepatic bile duct dilatation. New fullness of the pancreatic head which could represent rapidly developing obstructive neoplastic process or sequela of recent localized pancreatitis. Recommend further characterization with MRCP or ERCP. 2. No other acute or significant findings. No bowel obstruction or evidence of bowel wall inflammation. No free fluid or abscess collection. No free intraperitoneal air. 3. Additional chronic/incidental findings detailed above. These results will be called to the ordering clinician or representative by the Radiologist Assistant, and communication documented in the PACS or zVision Dashboard. Electronically Signed   By: Franki Cabot M.D.   On: 06/30/2018 11:23   Mr 3d Recon At Scanner  Result Date: 07/01/2018 CLINICAL DATA:  Biliary ductal dilatation status post ERCP with stent placement. EXAM: MRI ABDOMEN WITHOUT AND WITH CONTRAST (INCLUDING MRCP) TECHNIQUE: Multiplanar multisequence MR imaging of the abdomen was performed both before and after the administration of intravenous contrast. Heavily T2-weighted images of the biliary and pancreatic ducts were obtained, and three-dimensional MRCP images were rendered by post processing. CONTRAST:  9 cc Gadavist COMPARISON:  06/20/2018 FINDINGS: Lower chest: No acute findings. Hepatobiliary: There is been interval  decompression of the dilated bile ducts status post stent placement. Within the caudate lobe of liver there is a 1.8 cm T1 hyperintense and T2 hypointense liver lesion. This demonstrates some mild delayed internal enhancement. This is indeterminate. Adjacent low T1 signal intensity and increased T1 signal intensity structure measures 0.6 cm, image 18/3. No scratch set this exhibits relative hypoenhancement compared to the adjacent liver. A third T2 hyperintense and T1 hypointense structure is identified within segment 5 measuring 0.9 cm. This is too small to reliably characterize. Pancreas: Atrophy of the neck, body and tail of pancreas is identified with increase caliber of the main duct measuring up to 6 mm. The pancreatic parenchymal atrophy demonstrates progression when compared with exam from 02/27/2018. The main duct dilatation is new. Within the head of pancreas there is a mass which is hypoenhancing to the adjacent pancreas measuring 2 by 1.7 by 2.4 cm. This mass abuts the portal venous confluence without definite evidence for encasement. Small filling defect within the superior mesenteric vein measures 0.6 cm, image 52/2. There does not appear to be involvement of the superior mesenteric artery or celiac trunk. There is mild soft tissue stranding surrounding the head of pancreas and extending around the duodenum is noted concerning for superimposed pancreatitis. Spleen:  Within normal limits in size and appearance. Adrenals/Urinary Tract: Normal appearance of the adrenal glands. The kidneys are unremarkable. No kidney mass or hydronephrosis.  Stomach/Bowel: Lap band noted around the proximal stomach. Visualized small and large bowel loops are unremarkable. Vascular/Lymphatic: Normal appearance of the abdominal aorta. No aneurysm. No adenopathy identified. Other: Small amount of free fluid arising from the area around the head of pancreas noted. No focal fluid collections. Musculoskeletal: No suspicious bone  lesions identified. IMPRESSION: 1. There is a hypoenhancing mass involving the head of pancreas. There is associated atrophy of the pancreatic neck, body and tail with dilatation of the main duct. Imaging findings are concerning for pancreatic adenocarcinoma. This mass abuts the portal venous confluence without complete encasement. The main portal vein remains patent. No evidence for SMA or celiac involvement. Further evaluation with endoscopic ultrasound with tissue sampling is advised. 2. There are several indeterminate liver lesions identified. The largest is in the caudate lobe measuring 1.8 cm. Although this exhibits delayed internal enhancement (which may be a feature benign hemangioma) underlying liver metastasis cannot be excluded. The additional, smaller, less than 10 mm liver lesions are too small to reliably characterize. In the event biopsy-proven pancreatic adenocarcinoma consider further staging workup with PET-CT. 3. Suspect focal pancreatitis involving the head of pancreas. 4. Interval decompression of the bile ducts status post stent placement. 5. Small filling defect noted within the superior mesenteric vein which may represent thrombus. The portal vein however remains patent. Electronically Signed   By: Kerby Moors M.D.   On: 07/01/2018 16:12   Dg Ercp  Result Date: 07/01/2018 CLINICAL DATA:  68 year old female with a history of choledocholithiasis EXAM: ERCP TECHNIQUE: Multiple spot images obtained with the fluoroscopic device and submitted for interpretation post-procedure. FLUOROSCOPY TIME:  Fluoroscopy Time:  3 minutes 15 seconds COMPARISON:  None. FINDINGS: Multiple intraoperative fluoroscopic spot images during ERCP. Initial image demonstrates endoscope projecting over the upper abdomen. Overlying reservoir and tubing for gastric banding. There is then cannulation of the ampulla and retrograde infusion of contrast partially opacifying the extrahepatic biliary ducts. Placement of a  plastic biliary stent. IMPRESSION: Limited images during ERCP demonstrates placement of plastic biliary stent in the common bile duct. Please refer to the dictated operative report for full details of intraoperative findings and procedure. Electronically Signed   By: Corrie Mckusick D.O.   On: 07/01/2018 12:34   Mr Abdomen Mrcp Moise Boring Contast  Result Date: 07/01/2018 CLINICAL DATA:  Biliary ductal dilatation status post ERCP with stent placement. EXAM: MRI ABDOMEN WITHOUT AND WITH CONTRAST (INCLUDING MRCP) TECHNIQUE: Multiplanar multisequence MR imaging of the abdomen was performed both before and after the administration of intravenous contrast. Heavily T2-weighted images of the biliary and pancreatic ducts were obtained, and three-dimensional MRCP images were rendered by post processing. CONTRAST:  9 cc Gadavist COMPARISON:  06/20/2018 FINDINGS: Lower chest: No acute findings. Hepatobiliary: There is been interval decompression of the dilated bile ducts status post stent placement. Within the caudate lobe of liver there is a 1.8 cm T1 hyperintense and T2 hypointense liver lesion. This demonstrates some mild delayed internal enhancement. This is indeterminate. Adjacent low T1 signal intensity and increased T1 signal intensity structure measures 0.6 cm, image 18/3. No scratch set this exhibits relative hypoenhancement compared to the adjacent liver. A third T2 hyperintense and T1 hypointense structure is identified within segment 5 measuring 0.9 cm. This is too small to reliably characterize. Pancreas: Atrophy of the neck, body and tail of pancreas is identified with increase caliber of the main duct measuring up to 6 mm. The pancreatic parenchymal atrophy demonstrates progression when compared with exam from 02/27/2018. The  main duct dilatation is new. Within the head of pancreas there is a mass which is hypoenhancing to the adjacent pancreas measuring 2 by 1.7 by 2.4 cm. This mass abuts the portal venous  confluence without definite evidence for encasement. Small filling defect within the superior mesenteric vein measures 0.6 cm, image 52/2. There does not appear to be involvement of the superior mesenteric artery or celiac trunk. There is mild soft tissue stranding surrounding the head of pancreas and extending around the duodenum is noted concerning for superimposed pancreatitis. Spleen:  Within normal limits in size and appearance. Adrenals/Urinary Tract: Normal appearance of the adrenal glands. The kidneys are unremarkable. No kidney mass or hydronephrosis. Stomach/Bowel: Lap band noted around the proximal stomach. Visualized small and large bowel loops are unremarkable. Vascular/Lymphatic: Normal appearance of the abdominal aorta. No aneurysm. No adenopathy identified. Other: Small amount of free fluid arising from the area around the head of pancreas noted. No focal fluid collections. Musculoskeletal: No suspicious bone lesions identified. IMPRESSION: 1. There is a hypoenhancing mass involving the head of pancreas. There is associated atrophy of the pancreatic neck, body and tail with dilatation of the main duct. Imaging findings are concerning for pancreatic adenocarcinoma. This mass abuts the portal venous confluence without complete encasement. The main portal vein remains patent. No evidence for SMA or celiac involvement. Further evaluation with endoscopic ultrasound with tissue sampling is advised. 2. There are several indeterminate liver lesions identified. The largest is in the caudate lobe measuring 1.8 cm. Although this exhibits delayed internal enhancement (which may be a feature benign hemangioma) underlying liver metastasis cannot be excluded. The additional, smaller, less than 10 mm liver lesions are too small to reliably characterize. In the event biopsy-proven pancreatic adenocarcinoma consider further staging workup with PET-CT. 3. Suspect focal pancreatitis involving the head of pancreas. 4.  Interval decompression of the bile ducts status post stent placement. 5. Small filling defect noted within the superior mesenteric vein which may represent thrombus. The portal vein however remains patent. Electronically Signed   By: Kerby Moors M.D.   On: 07/01/2018 16:12      Current Outpatient Medications (Cardiovascular):  .  atenolol (TENORMIN) 100 MG tablet, TAKE 1 TABLET BY MOUTH EVERY DAY (Patient taking differently: Take 50 mg by mouth daily after supper. ) .  rosuvastatin (CRESTOR) 40 MG tablet, Take 1 tablet daily for Cholesterol (Patient taking differently: Take 40 mg by mouth daily. ) .  verapamil (CALAN) 80 MG tablet, Take 1 tablet 2 x /day with meal for BP (Patient taking differently: Take 80 mg by mouth 2 (two) times daily with a meal. )  Current Outpatient Medications (Respiratory):  .  promethazine (PHENERGAN) 25 MG tablet, Take 1/2-1 tab every 6 hours as needed for nausea. (Patient taking differently: Take 12.5-25 mg by mouth every 6 (six) hours as needed for nausea. )  Current Outpatient Medications (Analgesics):  .  aspirin EC 81 MG tablet, Take 81 mg by mouth daily.   Current Outpatient Medications (Other):  .  anastrozole (ARIMIDEX) 1 MG tablet, TAKE 1 TABLET BY MOUTH EVERY DAY (Patient taking differently: Take 1 mg by mouth daily. ) .  Cholecalciferol (VITAMIN D3) 5000 UNITS CAPS, Take 5,000 Units by mouth daily after supper.  .  ondansetron (ZOFRAN) 4 MG tablet, Take 1 tablet (4 mg total) by mouth every 8 (eight) hours as needed for nausea or vomiting. .  traZODone (DESYREL) 150 MG tablet, Take 1/2 to 1 tablet 1 to 2 hours before  Bedtime (Patient taking differently: Take 150 mg by mouth. ) .  Zinc 50 MG TABS, Take 50 mg by mouth daily after supper.  Past Medical History:  Diagnosis Date  . Allergy    SEASONAL  . Anxiety   . At risk for difficult insertion of breathing tube   . Breast cancer ([facility])   . Breast cancer of upper-outer quadrant of right female  breast ([place]) 12/03/2014  . 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 GERD (gastroesophageal reflux disease)    hx - resolved with lap band surgery  . H/O hiatal hernia    resolved with band surgery  . H/O laparoscopic adjustable gastric banding 2010  . Hyperlipidemia   . Hypertension   . Insomnia    tx with zanax  . Personal history of radiation therapy   . 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 Type II or unspecified type diabetes mellitus without mention of complication, not stated as uncontrolled 12/14/2012   no meds, diet controlled     Allergies  Allergen Reactions  . Codeine Itching  . Gabapentin Other (See Comments)    Reaction unknown  . Lipitor [Atorvastatin] Other (See Comments)    Severe fatigue. ? memory loss.  . Ace Inhibitors Cough    ROS: all negative except above.   Physical Exam: There were no vitals filed for this visit. There were no vitals taken for this visit.  General : Well sounding patient in no apparent distress HEENT: no hoarseness, no cough for duration of visit Lungs: speaks in complete sentences, no audible wheezing, no apparent distress Neurological: alert, oriented x 3 Psychiatric: pleasant, judgement appropriate    Izora Ribas, NP 9:46 AM [facility]

## 2018-07-08 ENCOUNTER — Ambulatory Visit: Payer: Medicare Other | Admitting: Adult Health

## 2018-07-08 ENCOUNTER — Encounter: Payer: Self-pay | Admitting: Adult Health

## 2018-07-08 ENCOUNTER — Other Ambulatory Visit: Payer: Self-pay

## 2018-07-08 VITALS — BP 138/86 | HR 72 | Temp 97.0°F | Wt 184.0 lb

## 2018-07-08 DIAGNOSIS — E876 Hypokalemia: Secondary | ICD-10-CM

## 2018-07-08 DIAGNOSIS — K831 Obstruction of bile duct: Secondary | ICD-10-CM

## 2018-07-08 DIAGNOSIS — R7989 Other specified abnormal findings of blood chemistry: Secondary | ICD-10-CM

## 2018-07-08 DIAGNOSIS — K8689 Other specified diseases of pancreas: Secondary | ICD-10-CM

## 2018-07-08 DIAGNOSIS — R945 Abnormal results of liver function studies: Secondary | ICD-10-CM

## 2018-07-11 ENCOUNTER — Ambulatory Visit: Payer: Self-pay | Admitting: Adult Health

## 2018-07-11 ENCOUNTER — Other Ambulatory Visit (HOSPITAL_COMMUNITY)
Admission: RE | Admit: 2018-07-11 | Discharge: 2018-07-11 | Disposition: A | Discharge status: Home / Self Care | Payer: Medicare Other | Source: Ambulatory Visit | Attending: Gastroenterology | Admitting: Gastroenterology

## 2018-07-11 DIAGNOSIS — Z01812 Encounter for preprocedural laboratory examination: Secondary | ICD-10-CM | POA: Diagnosis not present

## 2018-07-11 DIAGNOSIS — Z1159 Encounter for screening for other viral diseases: Secondary | ICD-10-CM | POA: Insufficient documentation

## 2018-07-11 LAB — SARS CORONAVIRUS 2 (TAT 6-24 HRS): SARS Coronavirus 2: NEGATIVE

## 2018-07-12 NOTE — Progress Notes (Signed)
Taylor Oconnor   [phone] Fax:([phone]) [place] Note   Patient Care Team: Unk Pinto, MD as PCP - General (Internal Medicine) Alphonsa Overall, MD as Consulting Physician (General Surgery) Magrinat, Virgie Dad, MD as Consulting Physician (Oncology) Thea Silversmith, MD as Consulting Physician (Radiation Oncology) Sylvan Cheese, NP as Nurse Practitioner (Hematology and Oncology) Mauri Pole, MD as Consulting Physician (Gastroenterology)  Date of Service:  07/14/2018   CHIEF COMPLAINTS/PURPOSE OF CONSULTATION:  Pancreatic Mass  REFERRING PHYSICIAN:  Dr. Ardis Hughs   Oncology History  Malignant neoplasm of upper-outer quadrant of right breast in female, estrogen receptor positive ([place])  11/15/2014 Mammogram   Right breast: mass warranting further evaluation   11/27/2014 Breast US   0.4 x 0.4 x 0.6 cm irregular hypoechoic mass within the right breast 10 o'clock position 7 cm from the nipple. Additionally there is a 0.7 x 0.8 x 0.8 cm taller than wide irregular hypoechoic mass with posterior acoustic shadowing within the right breast.   11/28/2014 Initial Biopsy   Right breast core needle biopsies, 2 masses: both invasive ductal carcinoma, grade 1, ER+ (100%), PR+ (85% -larger), HER2/neu negative, Ki67 5-10%.  Smaller mass PR-   11/28/2014 Clinical Stage   Stage IA: T1b N0   12/28/2014 Definitive Surgery   Right lumpectomies/SLNB: invasive ductal carcinoma, grade 2, repeat HER2/neu negative, 0/2 LN   12/28/2014 Pathologic Stage   Stage IA: npT1c pN0   12/28/2014 Oncotype testing   RS 16 (10% ROR)   02/18/2015 - 04/03/2015 Radiation Therapy    Right breast / 45 Gray at 1.8 [place] Cables per fraction x 25 fractions; right breast boost / 16 Gray at [employer] per fraction x 8 fractions   05/13/2015 -  Anti-estrogen oral therapy   Anastrozole 1 mg daily.   05/30/2015 Survivorship   SCP mailed to patient in lieu of in person visit     Pancreatic cancer ([place])  06/30/2018 Imaging   CT AP WO Contrast 06/30/18  IMPRESSION: 1. New common bile duct dilatation and intrahepatic bile duct dilatation. New fullness of the pancreatic head which could represent rapidly developing obstructive neoplastic process or sequela of recent localized pancreatitis. Recommend further characterization with MRCP or ERCP. 2. No other acute or significant findings. No bowel obstruction or evidence of bowel wall inflammation. No free fluid or abscess collection. No free intraperitoneal air. 3. Additional chronic/incidental findings detailed above.   07/01/2018 Procedure   ERCP by Dr Fuller Plan on 07/01/18  IMPRESSION - A single severe segmental biliary stricture was found in the lower third of the common bile duct. The stricture was malignant appearing. Brushed. - The entire biliary tree proximal to the stricture was dilated, secondary to the stricture. - Prior cholecystectomy. - A biliary sphincterotomy was performed. - One plastic stent was placed into the common bile duct.   07/01/2018 Pathology Results   Diagnosis 07/01/18 BILE DUCT BRUSHING (SPECIMEN 1 OF 1 COLLECTED 07/01/2018) SUSPICIOUS FOR ADENOCARCINOMA. SEE COMMENT.   07/14/2018 Initial Diagnosis   Pancreatic cancer ([name])   07/14/2018 Procedure   EUS by Dr Ardis Hughs on 07/14/18 IMPRESSION PENDING       HISTORY OF PRESENTING ILLNESS:  Taylor Oconnor 68 y.o. female is a here because of Pancreatic Mass. The patient was referred by Dr. Ardis Hughs. The patient presents to the clinic today alone.   She presented with abdominal pain and jaundice for about 1 week before going to ED on 06/30/18. She was sent to [facility] by  her PCP due to abnormal LFTs. She was admitted. CT and MRI of abdomen showed bile duct dilatation, and a mass in the head of pancreas.  Dr. Fuller Plan did her ERCP, stent placement and CBD brush cytology was concerning for cancer. He then referred him to [name] for more work up. She had EUS and FNA  of her pancreatic mass today.   She notes before admission her urine was very dark but this color happened before so she did not think about it.  Today the patient notes her pain did improve with stent placement but still fluctuates. She has been taking Tylenol 4 tabs daily. She notes she is still able to get adequate sleep. She notes she feels full after a few bites. She notes she lost 32 pounds since 02/2018. She thought it was dieting with staying at home. She denies HA, chest, pain, leg swelling. I reviewed her medication list. She is on Atenolol, and Verapamil and antiemetics for her recent nausea. 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   Socially she is married with an adult daughter. She is a non-smoker and does not drink alcohol. She is thinking of retiring soon. She has not been to work at [employer]. Her husband works as self-employed Animator.  They have a PMHx of HTN, Stage II CKD, DM is controlled and not on medication. She also has Polycystic ovarian syndrome. She had hiatal hernia which was created with gastric band. She was up to 230 pounds and had gastric band placed. She would like to have this removed to help her appetite.  She had breast cancer found by mammogram screening in 2016. She was treated by Dr [name]. She was treated with lumpectomy and radiation and currently on Anastrozole, tolerating well.  She has cousins who has breast cancer.    REVIEW OF SYSTEMS:    Constitutional: Denies fevers, chills (+) Low appetite, weight loss  Eyes: Denies blurriness of vision, double vision or watery eyes Ears, nose, mouth, throat, and face: Denies mucositis or sore throat Respiratory: Denies cough, dyspnea or wheezes Cardiovascular: Denies palpitation, chest discomfort or lower extremity swelling Gastrointestinal:  Denies heartburn or change in bowel habits (+) abdominal pain (+) nausea Skin: Denies abnormal skin  rashes Lymphatics: Denies new lymphadenopathy or easy bruising Neurological:Denies numbness, tingling or new weaknesses Behavioral/Psych: Mood is stable, no new changes  All other systems were reviewed with the patient and are negative.   MEDICAL HISTORY:  Past Medical History:  Diagnosis Date   Allergy    SEASONAL   Anxiety    At risk for difficult insertion of breathing tube    Breast cancer of upper-outer quadrant of right female breast ([place]) 12/03/2014   Difficult intubation pt has large tonsils,   GERD (gastroesophageal reflux disease)    hx - resolved with lap band surgery   H/O hiatal hernia    resolved with band surgery   H/O laparoscopic adjustable gastric banding 2010   History of kidney stones 02/2018   Hyperlipidemia    Hypertension    Hypokalemia    Insomnia    tx with zanax   Obesity    Pancreatic mass    PCOS (polycystic ovarian syndrome)    Personal history of radiation therapy    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) 12/14/2012   Type II or unspecified type diabetes mellitus without mention of complication, not stated as uncontrolled 12/14/2012   no meds, diet  controlled   Wears contact lenses    Wears glasses     SURGICAL HISTORY: Past Surgical History:  Procedure Laterality Date   BACK SURGERY     cervical   BILIARY STENT PLACEMENT N/A 07/01/2018   Procedure: BILIARY STENT PLACEMENT;  Surgeon: Ladene Artist, MD;  Location: [facility];  Service: Endoscopy;  Laterality: N/A;  cbd brushing   BREAST BIOPSY     BREAST LUMPECTOMY Right 2016   BREAST LUMPECTOMY WITH NEEDLE LOCALIZATION AND AXILLARY SENTINEL LYMPH NODE BX Right 12/28/2014   Procedure: RIGHT BREAST LUMPECTOMY WITH TWO (2) NEEDLE LOCALIZATION AND AXILLARY SENTINEL LYMPH NODE BX;  Surgeon: Alphonsa Overall, MD;  Location: [facility];  Service: General;  Laterality: Right;   CESAREAN SECTION     x 1   CHOLECYSTECTOMY     COLONOSCOPY  09/29/2004    normal   CYSTOSCOPY WITH RETROGRADE PYELOGRAM, URETEROSCOPY AND STENT PLACEMENT Right 03/24/2018   Procedure: CYSTOSCOPY WITH RETROGRADE PYELOGRAM, URETEROSCOPY AND STENT PLACEMENT;  Surgeon: Franchot Gallo, MD;  Location[facility];  Service: Urology;  Laterality: Right;  1 HR   DIAGNOSTIC LAPAROSCOPY     ERCP N/A 07/01/2018   Procedure: ENDOSCOPIC RETROGRADE CHOLANGIOPANCREATOGRAPHY (ERCP);  Surgeon: Ladene Artist, MD;  Location: [facility];  Service: Endoscopy;  Laterality: N/A;   HERNIA REPAIR  2008   LAPAROSCOPIC APPENDECTOMY N/A 08/06/2012   Procedure: APPENDECTOMY LAPAROSCOPIC;  Surgeon: Earnstine Regal, MD;  Location: [facility];  Service: General;  Laterality: N/A;   LAPAROSCOPIC GASTRIC BANDING WITH HIATAL HERNIA REPAIR N/A Nov 2008   SPHINCTEROTOMY  07/01/2018   Procedure: SPHINCTEROTOMY;  Surgeon: Ladene Artist, MD;  Location: [facility];  Service: Endoscopy;;   TUBAL LIGATION      SOCIAL HISTORY: Social History   Socioeconomic History   Marital status: Married    Spouse name: Not on file   Number of children: 1   Years of education: Not on file   Highest education level: Not on file  Occupational History   Not on file  Social Needs   Financial resource strain: Not on file   Food insecurity    Worry: Not on file    Inability: Not on file   Transportation needs    Medical: Not on file    Non-medical: Not on file  Tobacco Use   Smoking status: Never Smoker   Smokeless tobacco: Never Used  Substance and Sexual Activity   Alcohol use: No    Alcohol/week: 0.0 standard drinks   Drug use: No   Sexual activity: Not Currently    Birth control/protection: Post-menopausal  Lifestyle   Physical activity    Days per week: Not on file    Minutes per session: Not on file   Stress: Not on file  Relationships   Social connections    Talks on phone: Not on file    Gets together: Not on file    Attends religious service: Not on  file    Active member of club or organization: Not on file    Attends meetings of clubs or organizations: Not on file    Relationship status: Not on file   Intimate partner violence    Fear of current or ex partner: Not on file    Emotionally abused: Not on file    Physically abused: Not on file    Forced sexual activity: Not on file  Other Topics Concern   Not on file  Social History Narrative  Not on file    FAMILY HISTORY: Family History  Problem Relation Age of Onset   Cirrhosis Mother    Alcohol abuse Mother    Liver disease Mother    Diabetes Father    Hypertension Father    Stroke Father    Heart disease Father    AAA (abdominal aortic aneurysm) Sister    Cancer Cousin        Breast   Breast cancer Cousin        x3   Colon cancer Neg Hx    Rectal cancer Neg Hx    Stomach cancer Neg Hx     ALLERGIES:  is allergic to codeine; lipitor [atorvastatin]; and ace inhibitors.  MEDICATIONS:  No current facility-administered medications for this visit.    No current outpatient medications on file.   Facility-Administered Medications Ordered in Other Visits  Medication Dose Route Frequency Provider Last Rate Last Dose   0.9 %  sodium chloride infusion   Intravenous Continuous Milus Banister, MD       lactated ringers infusion   Intravenous Continuous Milus Banister, MD   Stopped at 07/14/18 [phone]    PHYSICAL EXAMINATION: ECOG PERFORMANCE STATUS: 1 - Symptomatic but completely ambulatory  Vitals:   07/14/18 1450  BP: (!) 149/81  Pulse: 86  Resp: 18  Temp: 98.7 F (37.1 C)  SpO2: 96%   Filed Weights   07/14/18 1450  Weight: 183 lb 14.4 oz (83.4 kg)    GENERAL:alert, no distress and comfortable SKIN: skin color, texture, turgor are normal, no rashes or significant lesions EYES: normal, Conjunctiva are pink and non-injected, sclera clear  NECK: supple, thyroid normal size, non-tender, without nodularity LYMPH:  no palpable  lymphadenopathy in the cervical, axillary  LUNGS: clear to auscultation and percussion with normal breathing effort HEART: regular rate & rhythm and no murmurs and no lower extremity edema ABDOMEN:abdomen soft, non-tender and normal bowel sounds (+) Palpable gastric band (+) Mild epigastic and RUQ tenderness, mainly epigastric Musculoskeletal:no cyanosis of digits and no clubbing  NEURO: alert & oriented x 3 with fluent speech, no focal motor/sensory deficits  LABORATORY DATA:  I have reviewed the data as listed CBC Latest Ref Rng & Units 07/02/2018 07/01/2018 06/30/2018  WBC 4.0 - 10.5 K/uL 9.8 5.7 7.2  Hemoglobin 12.0 - 15.0 g/dL 13.8 13.3 14.5  Hematocrit 36.0 - 46.0 % 42.7 40.1 43.2  Platelets 150 - 400 K/uL 285 269 311    CMP Latest Ref Rng & Units 07/02/2018 07/01/2018 06/30/2018  Glucose 70 - 99 mg/dL 160(H) 126(H) 177(H)  BUN 8 - 23 mg/dL _0 Creatinine 0.44 - 1.00 mg/dL 0.73 0.54 0.66  Sodium 135 - 145 mmol/L 139 138 139  Potassium 3.5 - 5.1 mmol/L 4.0 3.8 3.4(L)  Chloride 98 - 111 mmol/L 103 103 103  CO2 22 - 32 mmol/L _1 Calcium 8.9 - 10.3 mg/dL 9.2 9.0 9.1  Total Protein 6.5 - 8.1 g/dL 6.4(L) 6.5 7.5  Total Bilirubin 0.3 - 1.2 mg/dL 3.0(H) 7.6(H) 8.0(H)  Alkaline Phos 38 - 126 U/L 344(H) 367(H) 368(H)  AST 15 - 41 U/L 66(H) 193(H) 193(H)  ALT 0 - 44 U/L 339(H) 470(H) 591(H)    RADIOGRAPHIC STUDIES: I have personally reviewed the radiological images as listed and agreed with the findings in the report. Ct Abdomen Pelvis Wo Contrast  Result Date: 06/30/2018 CLINICAL DATA:  Abdominal pain and swelling, decreased appetite. Abnormal lab work. EXAM: CT ABDOMEN AND  PELVIS WITHOUT CONTRAST TECHNIQUE: Multidetector CT imaging of the abdomen and pelvis was performed following the standard protocol without IV contrast. COMPARISON:  CT abdomen dated 02/27/2018. FINDINGS: Lower chest: No acute abnormality. Hepatobiliary: New common bile duct dilatation and intrahepatic bile  duct dilatation. CBD measures 1.7 cm diameter. No focal liver abnormality. Status post cholecystectomy. Pancreas: New fullness of the pancreatic head. No pancreatic ductal dilatation seen. Spleen: Normal in size without focal abnormality. Adrenals/Urinary Tract: Adrenal glands appear normal. Kidneys are unremarkable without mass, stone or hydronephrosis. No ureteral or bladder calculi identified. Bladder is decompressed. Stomach/Bowel: No dilated large or small bowel loops. No evidence of bowel wall inflammation. Status post appendectomy. Lap band appears stable in position/configuration without evidence of complicating feature. Stomach otherwise unremarkable. Vascular/Lymphatic: No significant vascular findings are present. No enlarged abdominal or pelvic lymph nodes. Reproductive: Uterus and bilateral adnexa are unremarkable. Other: No free fluid or abscess collection seen. No free intraperitoneal air. Musculoskeletal: No acute or suspicious osseous finding. Mild degenerative spondylosis of the lumbar spine. IMPRESSION: 1. New common bile duct dilatation and intrahepatic bile duct dilatation. New fullness of the pancreatic head which could represent rapidly developing obstructive neoplastic process or sequela of recent localized pancreatitis. Recommend further characterization with MRCP or ERCP. 2. No other acute or significant findings. No bowel obstruction or evidence of bowel wall inflammation. No free fluid or abscess collection. No free intraperitoneal air. 3. Additional chronic/incidental findings detailed above. These results will be called to the ordering clinician or representative by the Radiologist Assistant, and communication documented in the PACS or zVision Dashboard. Electronically Signed   By: Franki Cabot M.D.   On: 06/30/2018 11:23   Mr 3d Recon At Scanner  Result Date: 07/01/2018 CLINICAL DATA:  Biliary ductal dilatation status post ERCP with stent placement. EXAM: MRI ABDOMEN WITHOUT AND  WITH CONTRAST (INCLUDING MRCP) TECHNIQUE: Multiplanar multisequence MR imaging of the abdomen was performed both before and after the administration of intravenous contrast. Heavily T2-weighted images of the biliary and pancreatic ducts were obtained, and three-dimensional MRCP images were rendered by post processing. CONTRAST:  9 cc Gadavist COMPARISON:  06/20/2018 FINDINGS: Lower chest: No acute findings. Hepatobiliary: There is been interval decompression of the dilated bile ducts status post stent placement. Within the caudate lobe of liver there is a 1.8 cm T1 hyperintense and T2 hypointense liver lesion. This demonstrates some mild delayed internal enhancement. This is indeterminate. Adjacent low T1 signal intensity and increased T1 signal intensity structure measures 0.6 cm, image 18/3. No scratch set this exhibits relative hypoenhancement compared to the adjacent liver. A third T2 hyperintense and T1 hypointense structure is identified within segment 5 measuring 0.9 cm. This is too small to reliably characterize. Pancreas: Atrophy of the neck, body and tail of pancreas is identified with increase caliber of the main duct measuring up to 6 mm. The pancreatic parenchymal atrophy demonstrates progression when compared with exam from 02/27/2018. The main duct dilatation is new. Within the head of pancreas there is a mass which is hypoenhancing to the adjacent pancreas measuring 2 by 1.7 by 2.4 cm. This mass abuts the portal venous confluence without definite evidence for encasement. Small filling defect within the superior mesenteric vein measures 0.6 cm, image 52/2. There does not appear to be involvement of the superior mesenteric artery or celiac trunk. There is mild soft tissue stranding surrounding the head of pancreas and extending around the duodenum is noted concerning for superimposed pancreatitis. Spleen:  Within normal limits in size  and appearance. Adrenals/Urinary Tract: Normal appearance of the  adrenal glands. The kidneys are unremarkable. No kidney mass or hydronephrosis. Stomach/Bowel: Lap band noted around the proximal stomach. Visualized small and large bowel loops are unremarkable. Vascular/Lymphatic: Normal appearance of the abdominal aorta. No aneurysm. No adenopathy identified. Other: Small amount of free fluid arising from the area around the head of pancreas noted. No focal fluid collections. Musculoskeletal: No suspicious bone lesions identified. IMPRESSION: 1. There is a hypoenhancing mass involving the head of pancreas. There is associated atrophy of the pancreatic neck, body and tail with dilatation of the main duct. Imaging findings are concerning for pancreatic adenocarcinoma. This mass abuts the portal venous confluence without complete encasement. The main portal vein remains patent. No evidence for SMA or celiac involvement. Further evaluation with endoscopic ultrasound with tissue sampling is advised. 2. There are several indeterminate liver lesions identified. The largest is in the caudate lobe measuring 1.8 cm. Although this exhibits delayed internal enhancement (which may be a feature benign hemangioma) underlying liver metastasis cannot be excluded. The additional, smaller, less than 10 mm liver lesions are too small to reliably characterize. In the event biopsy-proven pancreatic adenocarcinoma consider further staging workup with PET-CT. 3. Suspect focal pancreatitis involving the head of pancreas. 4. Interval decompression of the bile ducts status post stent placement. 5. Small filling defect noted within the superior mesenteric vein which may represent thrombus. The portal vein however remains patent. Electronically Signed   By: Kerby Moors M.D.   On: 07/01/2018 16:12   Dg Ercp  Result Date: 07/01/2018 CLINICAL DATA:  68 year old female with a history of choledocholithiasis EXAM: ERCP TECHNIQUE: Multiple spot images obtained with the fluoroscopic device and submitted for  interpretation post-procedure. FLUOROSCOPY TIME:  Fluoroscopy Time:  3 minutes 15 seconds COMPARISON:  None. FINDINGS: Multiple intraoperative fluoroscopic spot images during ERCP. Initial image demonstrates endoscope projecting over the upper abdomen. Overlying reservoir and tubing for gastric banding. There is then cannulation of the ampulla and retrograde infusion of contrast partially opacifying the extrahepatic biliary ducts. Placement of a plastic biliary stent. IMPRESSION: Limited images during ERCP demonstrates placement of plastic biliary stent in the common bile duct. Please refer to the dictated operative report for full details of intraoperative findings and procedure. Electronically Signed   By: Corrie Mckusick D.O.   On: 07/01/2018 12:34   Mr Abdomen Mrcp Moise Boring Contast  Result Date: 07/01/2018 CLINICAL DATA:  Biliary ductal dilatation status post ERCP with stent placement. EXAM: MRI ABDOMEN WITHOUT AND WITH CONTRAST (INCLUDING MRCP) TECHNIQUE: Multiplanar multisequence MR imaging of the abdomen was performed both before and after the administration of intravenous contrast. Heavily T2-weighted images of the biliary and pancreatic ducts were obtained, and three-dimensional MRCP images were rendered by post processing. CONTRAST:  9 cc Gadavist COMPARISON:  06/20/2018 FINDINGS: Lower chest: No acute findings. Hepatobiliary: There is been interval decompression of the dilated bile ducts status post stent placement. Within the caudate lobe of liver there is a 1.8 cm T1 hyperintense and T2 hypointense liver lesion. This demonstrates some mild delayed internal enhancement. This is indeterminate. Adjacent low T1 signal intensity and increased T1 signal intensity structure measures 0.6 cm, image 18/3. No scratch set this exhibits relative hypoenhancement compared to the adjacent liver. A third T2 hyperintense and T1 hypointense structure is identified within segment 5 measuring 0.9 cm. This is too small to  reliably characterize. Pancreas: Atrophy of the neck, body and tail of pancreas is identified with increase caliber of the  main duct measuring up to 6 mm. The pancreatic parenchymal atrophy demonstrates progression when compared with exam from 02/27/2018. The main duct dilatation is new. Within the head of pancreas there is a mass which is hypoenhancing to the adjacent pancreas measuring 2 by 1.7 by 2.4 cm. This mass abuts the portal venous confluence without definite evidence for encasement. Small filling defect within the superior mesenteric vein measures 0.6 cm, image 52/2. There does not appear to be involvement of the superior mesenteric artery or celiac trunk. There is mild soft tissue stranding surrounding the head of pancreas and extending around the duodenum is noted concerning for superimposed pancreatitis. Spleen:  Within normal limits in size and appearance. Adrenals/Urinary Tract: Normal appearance of the adrenal glands. The kidneys are unremarkable. No kidney mass or hydronephrosis. Stomach/Bowel: Lap band noted around the proximal stomach. Visualized small and large bowel loops are unremarkable. Vascular/Lymphatic: Normal appearance of the abdominal aorta. No aneurysm. No adenopathy identified. Other: Small amount of free fluid arising from the area around the head of pancreas noted. No focal fluid collections. Musculoskeletal: No suspicious bone lesions identified. IMPRESSION: 1. There is a hypoenhancing mass involving the head of pancreas. There is associated atrophy of the pancreatic neck, body and tail with dilatation of the main duct. Imaging findings are concerning for pancreatic adenocarcinoma. This mass abuts the portal venous confluence without complete encasement. The main portal vein remains patent. No evidence for SMA or celiac involvement. Further evaluation with endoscopic ultrasound with tissue sampling is advised. 2. There are several indeterminate liver lesions identified. The largest  is in the caudate lobe measuring 1.8 cm. Although this exhibits delayed internal enhancement (which may be a feature benign hemangioma) underlying liver metastasis cannot be excluded. The additional, smaller, less than 10 mm liver lesions are too small to reliably characterize. In the event biopsy-proven pancreatic adenocarcinoma consider further staging workup with PET-CT. 3. Suspect focal pancreatitis involving the head of pancreas. 4. Interval decompression of the bile ducts status post stent placement. 5. Small filling defect noted within the superior mesenteric vein which may represent thrombus. The portal vein however remains patent. Electronically Signed   By: Kerby Moors M.D.   On: 07/01/2018 16:12    ASSESSMENT & PLAN:  TAHLIYAH ANAGNOS is a 68 y.o. Caucasian female with a history of HTN, Stage II CKD, DM, Polycystic ovarian syndrome, s/p gastric band placed, H/o breast cancer.   1. Adenocarcinoma of Pancreatic Head, High grade,  cT2NxMx with indeterminate liver lesions -I reviewed her image findings and pathology reports with her in great detail. She was found to have a 2.4cm mass in head of pancreas, initial ERCP with stent placement and brushing cytology was suspicious for adenocarcinoma but not definitive.    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 Her pancreatic mass on the MRI and the EUS was borderline resectable, due to the invasion of portal vein. -Her case was discussed in our GI tumor board earlier this week, we recommend PET scan to further evaluate her liver lesions, and also complete her staging of the chest. Her liver lesions are difficult for biopsy  -if PET is negative for distant metastasis, I recommend neoadjuvant chemo first  -We discussed the aggressive nature of pancreatic adenocarcinoma.  We reviewed the Whipple surgical procedure.  If PET scan negative for distant metastasis, I will refer her to see Dr.  Barry Dienes. -If PET scan is positive she would have stage IV  disease and no longer eligible for surgery. Palliative systemic chemotherapy would be recommended to control her disease.  -Given her borderline resectable tumor, I recommend neo-adjuvant chemo with intensive FOLFIRINOX q3weeks to shrink her tumor to make surgery easier. If not enough she may receive radiation as well. Given her controlled comorbidities, I think she will be able to tolerate.   -Chemotherapy consent: Side effects including but does not limited to, fatigue, nausea, vomiting, diarrhea, hair loss, neuropathy, fluid retention, renal and kidney dysfunction, neutropenic fever, needed for blood transfusion, bleeding, were discussed with patient in great detail. She agrees to proceed.  --Will proceed with Chemo education class before start of treatment.  -Given her long term treatment, I recommend she have a PAC placed. She is interested.   -Will obtain baseline Tumor Marker CA19.9. Labs from this week show improved LFTs, Tbili down to 3.  -Physical exam unremarkable except mild epigastric tenderness today -f/u with first dose chemo.   2. Abdominal pain, secondary to #1 -Improved since stent placed on 07/01/18 -She currently takes Tylenol 4 tabs as needed daily.    3.  Obstructive jaundice, s/p stent placement on 07/01/18  -LFTs have improved after stent placement. Her jaundice has resolved. Tbili down to 3 this week.  -Will monitor   4. Low appetite, Weight loss, Nausea  -Years ago she had gastric band placed. She is interested in having it removed.  -She has lost 32 pounds over 5 months secondary to her recent cancer. -She takes Zofran and Phenergan for her nausea   5. H/o of right breast cancer, RS 16, ER/PR +, HER2- -Managed by Med Onc Dr [name]  -Diagnosed in 11/2014. S/p right lumpectomy and adjuvant radiation.  -She has been on Anastrozole since 05/2015. She is tolerating well. Will continue   6. Genetic testing   -Given her history of cancer and pancreatic cancer, I recommend genetic testing for her and FO for her tumor which can given more treatment options. She is interested.  -Send genetic referral.   7.  HTN, Stage II CKD, DM -DM is mostly controlled, she is off medication -On Verapamil and atenolol -Her Kidney function tests have been normal lately.    PLAN:  -Baseline Labs next week including CA19.9 -Will obtain PET scan in the next week  -Chemo education class and IR port placement in the next 1-2 weeks  -Send genetic referral  -F/u with start of FOLFIRINOX in about 2 weeks    Orders Placed This Encounter  Procedures   NM PET Image Initial (PI) Skull Base To Thigh    Standing Status:   Future    Standing Expiration Date:   07/14/2019    Order Specific Question:   If indicated for the ordered procedure, I authorize the administration of a radiopharmaceutical per Radiology protocol    Answer:   Yes    Order Specific Question:   Preferred imaging location?    Answer:   [facility]    Order Specific Question:   Radiology Contrast Protocol - do NOT remove file path    Answer:   [email or web address]   IR IMAGING GUIDED PORT INSERTION    Standing Status:   Future    Standing Expiration Date:   09/14/2019    Order Specific Question:   Reason for Exam (SYMPTOM  OR DIAGNOSIS REQUIRED)    Answer:   chemo    Order Specific Question:   Preferred Imaging Location?    Answer:   [facility]  CBC with Differential (Cancer Center Only)    Standing Status:   Standing    Number of Occurrences:   100    Standing Expiration Date:   07/14/2023   CMP ([place] only)    Standing Status:   Standing    Number of Occurrences:   100    Standing Expiration Date:   07/14/2023   CA 19.9    Standing Status:   Standing    Number of Occurrences:   100    Standing Expiration Date:   07/14/2023   Ambulatory referral to Genetics    Referral Priority:   Routine     Referral Type:   Consultation    Referral Reason:   Specialty Services Required    Number of Visits Requested:   1    All questions were answered. The patient knows to call the clinic with any problems, questions or concerns. I spent 55 minutes counseling the patient face to face. The total time spent in the appointment was 60 minutes and more than 50% was on counseling.     Truitt Merle, MD 07/14/2018 4:02 PM  I, Joslyn Devon, am acting as scribe for Truitt Merle, MD.   I have reviewed the above documentation for accuracy and completeness, and I agree with the above.

## 2018-07-13 ENCOUNTER — Other Ambulatory Visit: Payer: Self-pay

## 2018-07-13 ENCOUNTER — Encounter (HOSPITAL_COMMUNITY): Payer: Self-pay

## 2018-07-13 ENCOUNTER — Telehealth: Payer: Self-pay | Admitting: Gastroenterology

## 2018-07-13 NOTE — Telephone Encounter (Signed)
The patient has been notified to keep her appt for procedure tomorrow morning.  The pt has been advised of the information and verbalized understanding.

## 2018-07-13 NOTE — Progress Notes (Signed)
SPOKE W/  Arbie Cookey     SCREENING SYMPTOMS OF COVID 19:   COUGH--NO  RUNNY NOSE--- NO  SORE THROAT---NO  NASAL CONGESTION----NO  SNEEZING----NO  SHORTNESS OF BREATH---NO  DIFFICULTY BREATHING---NO  TEMP >100.0 -----NO  UNEXPLAINED BODY ACHES------NO  CHILLS -------- NO  HEADACHES ---------NO  LOSS OF SMELL/ TASTE --------NO    HAVE YOU OR ANY FAMILY MEMBER TRAVELLED PAST 14 DAYS OUT OF THE   COUNTY---NO STATE----NO COUNTRY----NO  HAVE YOU OR ANY FAMILY MEMBER BEEN EXPOSED TO ANYONE WITH COVID 19? NO

## 2018-07-13 NOTE — Telephone Encounter (Signed)
Patty, Can you please call her.  We had our GI cancer conference this morning and there was full agreement that we should continue with plans for her endoscopic ultrasound, biopsy tomorrow morning.  Thanks

## 2018-07-14 ENCOUNTER — Ambulatory Visit (HOSPITAL_COMMUNITY): Payer: Medicare Other | Admitting: Certified Registered"

## 2018-07-14 ENCOUNTER — Other Ambulatory Visit (HOSPITAL_COMMUNITY): Payer: Medicare Other

## 2018-07-14 ENCOUNTER — Inpatient Hospital Stay: Payer: Medicare Other | Attending: Hematology | Admitting: Hematology

## 2018-07-14 ENCOUNTER — Encounter: Payer: Self-pay | Admitting: Hematology

## 2018-07-14 ENCOUNTER — Other Ambulatory Visit: Payer: Self-pay

## 2018-07-14 ENCOUNTER — Encounter (HOSPITAL_COMMUNITY): Payer: Self-pay | Admitting: *Deleted

## 2018-07-14 ENCOUNTER — Encounter (HOSPITAL_COMMUNITY)
Admission: RE | Disposition: A | Discharge status: Home / Self Care | Payer: Self-pay | Source: Home / Self Care | Attending: Gastroenterology

## 2018-07-14 ENCOUNTER — Ambulatory Visit (HOSPITAL_COMMUNITY)
Admission: RE | Admit: 2018-07-14 | Discharge: 2018-07-14 | Disposition: A | Discharge status: Home / Self Care | Payer: Medicare Other | Attending: Gastroenterology | Admitting: Gastroenterology

## 2018-07-14 VITALS — BP 149/81 | HR 86 | Temp 98.7°F | Resp 18 | Ht 63.0 in | Wt 183.9 lb

## 2018-07-14 DIAGNOSIS — C250 Malignant neoplasm of head of pancreas: Secondary | ICD-10-CM | POA: Insufficient documentation

## 2018-07-14 DIAGNOSIS — K449 Diaphragmatic hernia without obstruction or gangrene: Secondary | ICD-10-CM | POA: Insufficient documentation

## 2018-07-14 DIAGNOSIS — N182 Chronic kidney disease, stage 2 (mild): Secondary | ICD-10-CM | POA: Insufficient documentation

## 2018-07-14 DIAGNOSIS — I10 Essential (primary) hypertension: Secondary | ICD-10-CM | POA: Diagnosis not present

## 2018-07-14 DIAGNOSIS — Z9884 Bariatric surgery status: Secondary | ICD-10-CM | POA: Diagnosis not present

## 2018-07-14 DIAGNOSIS — Z79899 Other long term (current) drug therapy: Secondary | ICD-10-CM | POA: Diagnosis not present

## 2018-07-14 DIAGNOSIS — E282 Polycystic ovarian syndrome: Secondary | ICD-10-CM | POA: Diagnosis not present

## 2018-07-14 DIAGNOSIS — K8689 Other specified diseases of pancreas: Secondary | ICD-10-CM

## 2018-07-14 DIAGNOSIS — F419 Anxiety disorder, unspecified: Secondary | ICD-10-CM | POA: Insufficient documentation

## 2018-07-14 DIAGNOSIS — Z79811 Long term (current) use of aromatase inhibitors: Secondary | ICD-10-CM | POA: Insufficient documentation

## 2018-07-14 DIAGNOSIS — R17 Unspecified jaundice: Secondary | ICD-10-CM | POA: Diagnosis not present

## 2018-07-14 DIAGNOSIS — K219 Gastro-esophageal reflux disease without esophagitis: Secondary | ICD-10-CM | POA: Insufficient documentation

## 2018-07-14 DIAGNOSIS — K1230 Oral mucositis (ulcerative), unspecified: Secondary | ICD-10-CM | POA: Diagnosis not present

## 2018-07-14 DIAGNOSIS — Z5111 Encounter for antineoplastic chemotherapy: Secondary | ICD-10-CM | POA: Insufficient documentation

## 2018-07-14 DIAGNOSIS — K831 Obstruction of bile duct: Secondary | ICD-10-CM | POA: Insufficient documentation

## 2018-07-14 DIAGNOSIS — Z170 Estrogen receptor positive status [ER+]: Secondary | ICD-10-CM | POA: Diagnosis not present

## 2018-07-14 DIAGNOSIS — Z803 Family history of malignant neoplasm of breast: Secondary | ICD-10-CM | POA: Diagnosis not present

## 2018-07-14 DIAGNOSIS — Z7982 Long term (current) use of aspirin: Secondary | ICD-10-CM | POA: Diagnosis not present

## 2018-07-14 DIAGNOSIS — C50411 Malignant neoplasm of upper-outer quadrant of right female breast: Secondary | ICD-10-CM | POA: Insufficient documentation

## 2018-07-14 DIAGNOSIS — E785 Hyperlipidemia, unspecified: Secondary | ICD-10-CM | POA: Insufficient documentation

## 2018-07-14 DIAGNOSIS — R630 Anorexia: Secondary | ICD-10-CM | POA: Insufficient documentation

## 2018-07-14 DIAGNOSIS — E1122 Type 2 diabetes mellitus with diabetic chronic kidney disease: Secondary | ICD-10-CM | POA: Diagnosis not present

## 2018-07-14 DIAGNOSIS — I129 Hypertensive chronic kidney disease with stage 1 through stage 4 chronic kidney disease, or unspecified chronic kidney disease: Secondary | ICD-10-CM | POA: Diagnosis not present

## 2018-07-14 DIAGNOSIS — R634 Abnormal weight loss: Secondary | ICD-10-CM | POA: Insufficient documentation

## 2018-07-14 DIAGNOSIS — D490 Neoplasm of unspecified behavior of digestive system: Secondary | ICD-10-CM

## 2018-07-14 DIAGNOSIS — C259 Malignant neoplasm of pancreas, unspecified: Secondary | ICD-10-CM | POA: Diagnosis not present

## 2018-07-14 DIAGNOSIS — K869 Disease of pancreas, unspecified: Secondary | ICD-10-CM | POA: Diagnosis not present

## 2018-07-14 DIAGNOSIS — M546 Pain in thoracic spine: Secondary | ICD-10-CM | POA: Insufficient documentation

## 2018-07-14 DIAGNOSIS — C787 Secondary malignant neoplasm of liver and intrahepatic bile duct: Secondary | ICD-10-CM | POA: Diagnosis not present

## 2018-07-14 DIAGNOSIS — G4700 Insomnia, unspecified: Secondary | ICD-10-CM | POA: Diagnosis not present

## 2018-07-14 HISTORY — DX: Obesity, unspecified: E66.9

## 2018-07-14 HISTORY — DX: Hypokalemia: E87.6

## 2018-07-14 HISTORY — DX: Polycystic ovarian syndrome: E28.2

## 2018-07-14 HISTORY — PX: EUS: SHX5427

## 2018-07-14 HISTORY — DX: Presence of spectacles and contact lenses: Z97.3

## 2018-07-14 HISTORY — PX: FINE NEEDLE ASPIRATION: SHX5430

## 2018-07-14 HISTORY — DX: Other specified diseases of pancreas: K86.89

## 2018-07-14 HISTORY — PX: ESOPHAGOGASTRODUODENOSCOPY (EGD) WITH PROPOFOL: SHX5813

## 2018-07-14 SURGERY — UPPER ENDOSCOPIC ULTRASOUND (EUS) RADIAL
Anesthesia: Monitor Anesthesia Care

## 2018-07-14 MED ORDER — PROPOFOL 500 MG/50ML IV EMUL
INTRAVENOUS | Status: DC | PRN
  Administered 2018-07-14: 125 ug/kg/min via INTRAVENOUS

## 2018-07-14 MED ORDER — LACTATED RINGERS IV SOLN
INTRAVENOUS | Status: DC
  Administered 2018-07-14: 1000 mL via INTRAVENOUS

## 2018-07-14 MED ORDER — SODIUM CHLORIDE 0.9 % IV SOLN: INTRAVENOUS | Status: DC

## 2018-07-14 MED ORDER — PROPOFOL 10 MG/ML IV BOLUS
INTRAVENOUS | Status: AC
  Filled 2018-07-14: qty 60

## 2018-07-14 MED ORDER — LIDOCAINE 2% (20 MG/ML) 5 ML SYRINGE
INTRAMUSCULAR | Status: DC | PRN
  Administered 2018-07-14: 60 mg via INTRAVENOUS

## 2018-07-14 MED ORDER — PROPOFOL 10 MG/ML IV BOLUS
INTRAVENOUS | Status: AC
  Filled 2018-07-14: qty 20

## 2018-07-14 MED ORDER — PROPOFOL 10 MG/ML IV BOLUS
INTRAVENOUS | Status: DC | PRN
  Administered 2018-07-14 (×3): 20 mg via INTRAVENOUS

## 2018-07-14 NOTE — Transfer of Care (Signed)
Immediate Anesthesia Transfer of Care Note  Patient: Taylor Oconnor  Procedure(s) Performed: UPPER ENDOSCOPIC ULTRASOUND (EUS) RADIAL (N/A ) FINE NEEDLE ASPIRATION (FNA) RADIAL  Patient Location: PACU and Endoscopy Unit  Anesthesia Type:MAC  Level of Consciousness: awake, alert  and oriented  Airway & Oxygen Therapy: Patient Spontanous Breathing and Patient connected to nasal cannula oxygen  Post-op Assessment: Report given to RN and Post -op Vital signs reviewed and stable  Post vital signs: Reviewed and stable  Last Vitals:  Vitals Value Taken Time  BP 125/75 07/14/18 0926  Temp    Pulse 76 07/14/18 0927  Resp 15 07/14/18 0927  SpO2 100 % 07/14/18 0927  Vitals shown include unvalidated device data.  Last Pain:  Vitals:   07/14/18 0728  TempSrc: Oral  PainSc: 2          Complications: No apparent anesthesia complications

## 2018-07-14 NOTE — Anesthesia Procedure Notes (Signed)
Procedure Name: MAC Date/Time: 07/14/2018 8:30 AM Performed by: Eben Burow, CRNA Pre-anesthesia Checklist: Patient identified, Emergency Drugs available, Suction available, Patient being monitored and Timeout performed Oxygen Delivery Method: Nasal cannula Dental Injury: Teeth and Oropharynx as per pre-operative assessment

## 2018-07-14 NOTE — Op Note (Signed)
[facility] Patient Name: [name] Procedure Date: 07/14/2018 MRN: [id number] Attending MD: Milus Banister , MD Date of Birth: October 28, 1950 CSN: [id number] Age: 68 Admit Type: Outpatient Procedure:                Upper EUS Indications:              Recent painless jaundice, mass in pancreas on MR,                            s/p ERCP Dr. Fuller Plan with placement of plastic                            biliary stent and biliary brushing ('suspicious for                            adenocarcinoma') Providers:                Milus Banister, MD, Grace Isaac, RN, Cherylynn Ridges, Technician Referring MD:             Lucio Edward, MD Medicines:                Monitored Anesthesia Care Complications:            No immediate complications. Estimated blood loss:                            None. Estimated Blood Loss:     Estimated blood loss: none. Procedure:                Pre-Anesthesia Assessment:                           - Prior to the procedure, a History and Physical                            was performed, and patient medications and                            allergies were reviewed. The patient's tolerance of                            previous anesthesia was also reviewed. The risks                            and benefits of the procedure and the sedation                            options and risks were discussed with the patient.                            All questions were answered, and informed consent                            was  obtained. Prior Anticoagulants: The patient has                            taken no previous anticoagulant or antiplatelet                            agents. ASA Grade Assessment: II - A patient with                            mild systemic disease. After reviewing the risks                            and benefits, the patient was deemed in                            satisfactory condition to undergo the  procedure.                           After obtaining informed consent, the endoscope was                            passed under direct vision. Throughout the                            procedure, the patient's blood pressure, pulse, and                            oxygen saturations were monitored continuously. The                            [id number] ([id number]) Olympus Radial EUS was                            introduced through the mouth, and advanced to the                            distal bulb of duodenum. The [id number] ([id number])                            Olympus Linear EUS was introduced through the                            mouth, and advanced to the second part of duodenum.                            The upper EUS was accomplished without difficulty.                            The patient tolerated the procedure well. Scope In: Scope Out: Findings:      ENDOSCOPIC FINDING: :      The examined esophagus was endoscopically normal.      The entire examined stomach was endoscopically normal.      The examined duodenum (to the distal bulb) was endoscopically normal.  ENDOSONOGRAPHIC FINDING: :      1. A round mass was identified in the pancreatic head. The mass was       hypoechoic and heterogenous. The mass measured 28 mm in maximal       cross-sectional diameter. The endosonographic borders were       poorly-defined. The remainder of the pancreas was examined and the       parenchyma was normal. The mass directly abutted the PV confluence with       loss of normal tissue interface. Fine needle aspiration for cytology was       performed. Color Doppler imaging was utilized prior to needle puncture       to confirm a lack of significant vascular structures within the needle       path. Two passes were made with the 25 gauge needle using a       transduodenal approach. A cytotechnologist was present to evaluate the       adequacy of the specimen. Final cytology results are  pending.      2. Previously placed biliary stent wsa noted in the CBD.      3. Main pancreatic duct was dilated (5.1mm in the body).      4. Limited evaluation of the liver, spleen, portal and splenic vessels       were otherwise normal.      5. Gallbladder surgically absent Impression:               - 2.8cm round mass with indistinct outer borders in                            the head of the pancreas. FNA preliminary review                            was positive for malignancy (likely                            adenocarcinoma). Await final pathology.                           - The mass directly abuts the PV confluence with                            loss of normal tissue interface, suggesting                            invasion.                           - Previously placed biliary stent was noted in the                            CBD. Moderate Sedation:      Not Applicable - Patient had care per Anesthesia. Recommendation:           - Discharge patient to home (ambulatory).                           - Continue with plans to meet Dr. Truitt Merle later  today. Procedure Code(s):        --- Professional ---                           [phone], Esophagogastroduodenoscopy, flexible,                            transoral; with transendoscopic ultrasound-guided                            intramural or transmural fine needle                            aspiration/biopsy(s), (includes endoscopic                            ultrasound examination limited to the esophagus,                            stomach or duodenum, and adjacent structures) Diagnosis Code(s):        --- Professional ---                           K86.89, Other specified diseases of pancreas                           K31.89, Other diseases of stomach and duodenum CPT copyright 2019 [employer] Association. All rights reserved. The codes documented in this report are preliminary and upon coder review  may  be revised to meet current compliance requirements. Milus Banister, MD 07/14/2018 9:23:34 AM This report has been signed electronically. Number of Addenda: 0

## 2018-07-14 NOTE — Discharge Instructions (Signed)
YOU HAD AN ENDOSCOPIC PROCEDURE TODAY: Refer to the procedure report and other information in the discharge instructions given to you for any specific questions about what was found during the examination. If this information does not answer your questions, please call [place] office at [phone] to clarify.  ° °YOU SHOULD EXPECT: Some feelings of bloating in the abdomen. Passage of more gas than usual. Walking can help get rid of the air that was put into your GI tract during the procedure and reduce the bloating. If you had a lower endoscopy (such as a colonoscopy or flexible sigmoidoscopy) you may notice spotting of blood in your stool or on the toilet paper. Some abdominal soreness may be present for a day or two, also. ° °DIET: Your first meal following the procedure should be a light meal and then it is ok to progress to your normal diet. A half-sandwich or bowl of soup is an example of a good first meal. Heavy or fried foods are harder to digest and may make you feel nauseous or bloated. Drink plenty of fluids but you should avoid alcoholic beverages for 24 hours. If you had a esophageal dilation, please see attached instructions for diet.   ° °ACTIVITY: Your care partner should take you home directly after the procedure. You should plan to take it easy, moving slowly for the rest of the day. You can resume normal activity the day after the procedure however YOU SHOULD NOT DRIVE, use power tools, machinery or perform tasks that involve climbing or major physical exertion for 24 hours (because of the sedation medicines used during the test).  ° °SYMPTOMS TO REPORT IMMEDIATELY: °A gastroenterologist can be reached at any hour. Please call [phone]  for any of the following symptoms:  °Following lower endoscopy (colonoscopy, flexible sigmoidoscopy) °Excessive amounts of blood in the stool  °Significant tenderness, worsening of abdominal pains  °Swelling of the abdomen that is new, acute  °Fever of 100° or  higher  °Following upper endoscopy (EGD, EUS, ERCP, esophageal dilation) °Vomiting of blood or coffee ground material  °New, significant abdominal pain  °New, significant chest pain or pain under the shoulder blades  °Painful or persistently difficult swallowing  °New shortness of breath  °Black, tarry-looking or red, bloody stools ° °FOLLOW UP:  °If any biopsies were taken you will be contacted by phone or by letter within the next 1-3 weeks. Call [phone]  if you have not heard about the biopsies in 3 weeks.  °Please also call with any specific questions about appointments or follow up tests. ° °

## 2018-07-14 NOTE — Anesthesia Postprocedure Evaluation (Signed)
Anesthesia Post Note  Patient: Taylor Oconnor  Procedure(s) Performed: UPPER ENDOSCOPIC ULTRASOUND (EUS) RADIAL (N/A ) FINE NEEDLE ASPIRATION (FNA) RADIAL     Patient location during evaluation: PACU Anesthesia Type: MAC Level of consciousness: awake and alert Pain management: pain level controlled Vital Signs Assessment: post-procedure vital signs reviewed and stable Respiratory status: spontaneous breathing Cardiovascular status: stable Anesthetic complications: no    Last Vitals:  Vitals:   07/14/18 0940 07/14/18 0950  BP: (!) 158/80 (!) 158/80  Pulse: 72 61  Resp: 17 18  Temp:    SpO2: 100% 98%    Last Pain:  Vitals:   07/14/18 0950  TempSrc:   PainSc: 0-No pain                 Nolon Nations

## 2018-07-14 NOTE — Anesthesia Preprocedure Evaluation (Signed)
Anesthesia Evaluation  Patient identified by MRN, date of birth, ID band Patient awake    Reviewed: Allergy & Precautions, NPO status , Patient's Chart, lab work & pertinent test results, reviewed documented beta blocker date and time   History of Anesthesia Complications (+) history of anesthetic complications  Airway Mallampati: III  TM Distance: >3 FB Neck ROM: Full    Dental no notable dental hx. (+) Teeth Intact, Dental Advisory Given   Pulmonary neg pulmonary ROS,    Pulmonary exam normal breath sounds clear to auscultation       Cardiovascular hypertension, Pt. on medications and Pt. on home beta blockers negative cardio ROS Normal cardiovascular exam Rhythm:Regular Rate:Normal     Neuro/Psych PSYCHIATRIC DISORDERS Anxiety negative neurological ROS     GI/Hepatic Neg liver ROS, hiatal hernia, GERD  ,  Endo/Other  negative endocrine ROSdiabetes, Well Controlled, Type 2  Renal/GU Renal InsufficiencyRenal disease  negative genitourinary   Musculoskeletal negative musculoskeletal ROS (+)   Abdominal   Peds negative pediatric ROS (+)  Hematology negative hematology ROS (+)   Anesthesia Other Findings Right ureteral stone  Reproductive/Obstetrics negative OB ROS                             Anesthesia Physical  Anesthesia Plan  ASA: III  Anesthesia Plan: MAC   Post-op Pain Management:    Induction: Intravenous  PONV Risk Score and Plan: 2 and Ondansetron, Treatment may vary due to age or medical condition, Propofol infusion and TIVA  Airway Management Planned: Natural Airway  Additional Equipment:   Intra-op Plan:   Post-operative Plan:   Informed Consent: I have reviewed the patients History and Physical, chart, labs and discussed the procedure including the risks, benefits and alternatives for the proposed anesthesia with the patient or authorized representative who has  indicated his/her understanding and acceptance.     Dental advisory given  Plan Discussed with: CRNA  Anesthesia Plan Comments:         Anesthesia Quick Evaluation

## 2018-07-14 NOTE — Interval H&P Note (Signed)
History and Physical Interval Note:  07/14/2018 7:33 AM  Taylor Oconnor  has presented today for surgery, with the diagnosis of CBD stricture.  The various methods of treatment have been discussed with the patient and family. After consideration of risks, benefits and other options for treatment, the patient has consented to  Procedure(s): UPPER ENDOSCOPIC ULTRASOUND (EUS) RADIAL (N/A) as a surgical intervention.  The patient's history has been reviewed, patient examined, no change in status, stable for surgery.  I have reviewed the patient's chart and labs.  Questions were answered to the patient's satisfaction.     Milus Banister

## 2018-07-15 ENCOUNTER — Encounter: Payer: Self-pay | Admitting: Hematology

## 2018-07-15 MED ORDER — LIDOCAINE-PRILOCAINE 2.5-2.5 % EX CREA: TOPICAL_CREAM | CUTANEOUS | 3 refills | Status: DC

## 2018-07-15 MED ORDER — ONDANSETRON HCL 8 MG PO TABS: 8.0000 mg | ORAL_TABLET | Freq: Two times a day (BID) | ORAL | 1 refills | Status: DC | PRN

## 2018-07-15 MED ORDER — PROCHLORPERAZINE MALEATE 10 MG PO TABS: 10.0000 mg | ORAL_TABLET | Freq: Four times a day (QID) | ORAL | 1 refills | Status: DC | PRN

## 2018-07-15 NOTE — Addendum Note (Signed)
Addended by: Truitt Merle on: 07/15/2018 10:31 AM   Modules accepted: Orders

## 2018-07-15 NOTE — Progress Notes (Signed)
START ON PATHWAY REGIMEN - Pancreatic Adenocarcinoma     A cycle is every 14 days:     Oxaliplatin      Leucovorin      Irinotecan      Fluorouracil   **Always confirm dose/schedule in your pharmacy ordering system**  Patient Characteristics: No Distant Metastases, Borderline Resectable or High Risk, Potentially Resectable, Primary Neoadjuvant Therapy, PS = 0, 1 Current evidence of distant metastases<= No AJCC T Category: T2 AJCC N Category: N0 AJCC M Category: M0 AJCC 8 Stage Grouping: IB ECOG Performance Status: 1 Intent of Therapy: Curative Intent, Discussed with Patient

## 2018-07-18 ENCOUNTER — Telehealth: Payer: Self-pay | Admitting: Oncology

## 2018-07-18 ENCOUNTER — Telehealth: Payer: Self-pay | Admitting: Hematology

## 2018-07-18 NOTE — Telephone Encounter (Signed)
Scheduled appt per 7/02 sch message - pt aware of appt date and time

## 2018-07-18 NOTE — Telephone Encounter (Signed)
Scheduled appt per 7/2 los. Spoke wit patient and she is aware of her appt date and time.  Scheduled 1st treatment on 7/20, due to needing a  MD visit and and also due to infusion already being in the red, around certain time slots.

## 2018-07-19 ENCOUNTER — Encounter (HOSPITAL_COMMUNITY): Payer: Self-pay | Admitting: Gastroenterology

## 2018-07-19 ENCOUNTER — Other Ambulatory Visit: Payer: Self-pay | Admitting: Radiology

## 2018-07-19 NOTE — Progress Notes (Signed)
Confirm diagnosis with tissue obtained from EUS 7/2. PET scan. Possible neoadjuvant chemotherapy.

## 2018-07-20 ENCOUNTER — Encounter (HOSPITAL_COMMUNITY): Payer: Self-pay

## 2018-07-20 ENCOUNTER — Ambulatory Visit (HOSPITAL_COMMUNITY)
Admission: RE | Admit: 2018-07-20 | Discharge: 2018-07-20 | Disposition: A | Discharge status: Home / Self Care | Payer: Medicare Other | Source: Ambulatory Visit | Attending: Hematology | Admitting: Hematology

## 2018-07-20 ENCOUNTER — Other Ambulatory Visit: Payer: Self-pay

## 2018-07-20 ENCOUNTER — Ambulatory Visit (HOSPITAL_COMMUNITY)
Admission: RE | Admit: 2018-07-20 | Discharge: 2018-07-20 | Disposition: A | Discharge status: Home / Self Care | Payer: Medicare Other | Source: Ambulatory Visit | Attending: Genetic Counselor | Admitting: Genetic Counselor

## 2018-07-20 DIAGNOSIS — G4700 Insomnia, unspecified: Secondary | ICD-10-CM | POA: Insufficient documentation

## 2018-07-20 DIAGNOSIS — Z9851 Tubal ligation status: Secondary | ICD-10-CM | POA: Diagnosis not present

## 2018-07-20 DIAGNOSIS — Z8249 Family history of ischemic heart disease and other diseases of the circulatory system: Secondary | ICD-10-CM | POA: Diagnosis not present

## 2018-07-20 DIAGNOSIS — Z885 Allergy status to narcotic agent status: Secondary | ICD-10-CM | POA: Diagnosis not present

## 2018-07-20 DIAGNOSIS — K219 Gastro-esophageal reflux disease without esophagitis: Secondary | ICD-10-CM | POA: Insufficient documentation

## 2018-07-20 DIAGNOSIS — Z452 Encounter for adjustment and management of vascular access device: Secondary | ICD-10-CM | POA: Diagnosis not present

## 2018-07-20 DIAGNOSIS — Z79811 Long term (current) use of aromatase inhibitors: Secondary | ICD-10-CM | POA: Diagnosis not present

## 2018-07-20 DIAGNOSIS — E1122 Type 2 diabetes mellitus with diabetic chronic kidney disease: Secondary | ICD-10-CM | POA: Diagnosis not present

## 2018-07-20 DIAGNOSIS — I129 Hypertensive chronic kidney disease with stage 1 through stage 4 chronic kidney disease, or unspecified chronic kidney disease: Secondary | ICD-10-CM | POA: Diagnosis not present

## 2018-07-20 DIAGNOSIS — Z960 Presence of urogenital implants: Secondary | ICD-10-CM | POA: Insufficient documentation

## 2018-07-20 DIAGNOSIS — N182 Chronic kidney disease, stage 2 (mild): Secondary | ICD-10-CM | POA: Insufficient documentation

## 2018-07-20 DIAGNOSIS — E669 Obesity, unspecified: Secondary | ICD-10-CM | POA: Diagnosis not present

## 2018-07-20 DIAGNOSIS — Z888 Allergy status to other drugs, medicaments and biological substances status: Secondary | ICD-10-CM | POA: Diagnosis not present

## 2018-07-20 DIAGNOSIS — C259 Malignant neoplasm of pancreas, unspecified: Secondary | ICD-10-CM | POA: Diagnosis not present

## 2018-07-20 DIAGNOSIS — Z823 Family history of stroke: Secondary | ICD-10-CM | POA: Diagnosis not present

## 2018-07-20 DIAGNOSIS — E785 Hyperlipidemia, unspecified: Secondary | ICD-10-CM | POA: Diagnosis not present

## 2018-07-20 DIAGNOSIS — Z9884 Bariatric surgery status: Secondary | ICD-10-CM | POA: Insufficient documentation

## 2018-07-20 DIAGNOSIS — Z79899 Other long term (current) drug therapy: Secondary | ICD-10-CM | POA: Diagnosis not present

## 2018-07-20 DIAGNOSIS — Z803 Family history of malignant neoplasm of breast: Secondary | ICD-10-CM | POA: Diagnosis not present

## 2018-07-20 DIAGNOSIS — Z7982 Long term (current) use of aspirin: Secondary | ICD-10-CM | POA: Diagnosis not present

## 2018-07-20 DIAGNOSIS — Z833 Family history of diabetes mellitus: Secondary | ICD-10-CM | POA: Insufficient documentation

## 2018-07-20 DIAGNOSIS — C250 Malignant neoplasm of head of pancreas: Secondary | ICD-10-CM | POA: Diagnosis not present

## 2018-07-20 DIAGNOSIS — Z6832 Body mass index (BMI) 32.0-32.9, adult: Secondary | ICD-10-CM | POA: Diagnosis not present

## 2018-07-20 DIAGNOSIS — C50411 Malignant neoplasm of upper-outer quadrant of right female breast: Secondary | ICD-10-CM | POA: Diagnosis not present

## 2018-07-20 HISTORY — PX: IR IMAGING GUIDED PORT INSERTION: IMG5740

## 2018-07-20 LAB — CBC WITH DIFFERENTIAL/PLATELET
Abs Immature Granulocytes: 0.03 10*3/uL (ref 0.00–0.07)
Basophils Absolute: 0 10*3/uL (ref 0.0–0.1)
Basophils Relative: 1 %
Eosinophils Absolute: 0.1 10*3/uL (ref 0.0–0.5)
Eosinophils Relative: 1 %
HCT: 39.7 % (ref 36.0–46.0)
Hemoglobin: 13.1 g/dL (ref 12.0–15.0)
Immature Granulocytes: 0 %
Lymphocytes Relative: 23 %
Lymphs Abs: 1.7 10*3/uL (ref 0.7–4.0)
MCH: 31.2 pg (ref 26.0–34.0)
MCHC: 33 g/dL (ref 30.0–36.0)
MCV: 94.5 fL (ref 80.0–100.0)
Monocytes Absolute: 0.6 10*3/uL (ref 0.1–1.0)
Monocytes Relative: 8 %
Neutro Abs: 4.9 10*3/uL (ref 1.7–7.7)
Neutrophils Relative %: 67 %
Platelets: 388 10*3/uL (ref 150–400)
RBC: 4.2 MIL/uL (ref 3.87–5.11)
RDW: 12.6 % (ref 11.5–15.5)
WBC: 7.4 10*3/uL (ref 4.0–10.5)
nRBC: 0 % (ref 0.0–0.2)

## 2018-07-20 LAB — GLUCOSE, CAPILLARY: Glucose-Capillary: 115 mg/dL — ABNORMAL HIGH (ref 70–99)

## 2018-07-20 LAB — PROTIME-INR
INR: 1.1 (ref 0.8–1.2)
Prothrombin Time: 13.6 seconds (ref 11.4–15.2)

## 2018-07-20 MED ORDER — FENTANYL CITRATE (PF) 100 MCG/2ML IJ SOLN
INTRAMUSCULAR | Status: AC
  Filled 2018-07-20: qty 2

## 2018-07-20 MED ORDER — MIDAZOLAM HCL 2 MG/2ML IJ SOLN
INTRAMUSCULAR | Status: AC
  Filled 2018-07-20: qty 4

## 2018-07-20 MED ORDER — MIDAZOLAM HCL 2 MG/2ML IJ SOLN
INTRAMUSCULAR | Status: AC | PRN
  Administered 2018-07-20 (×4): 1 mg via INTRAVENOUS

## 2018-07-20 MED ORDER — SODIUM CHLORIDE 0.9 % IV SOLN
INTRAVENOUS | Status: DC
  Administered 2018-07-20: 11:00:00 via INTRAVENOUS

## 2018-07-20 MED ORDER — HEPARIN SOD (PORK) LOCK FLUSH 100 UNIT/ML IV SOLN
INTRAVENOUS | Status: AC
  Filled 2018-07-20: qty 5

## 2018-07-20 MED ORDER — CEFAZOLIN SODIUM-DEXTROSE 2-4 GM/100ML-% IV SOLN
INTRAVENOUS | Status: AC
  Administered 2018-07-20: 13:00:00 2 g via INTRAVENOUS
  Filled 2018-07-20: qty 100

## 2018-07-20 MED ORDER — HEPARIN SOD (PORK) LOCK FLUSH 100 UNIT/ML IV SOLN
INTRAVENOUS | Status: AC | PRN
  Administered 2018-07-20: 500 [IU] via INTRAVENOUS

## 2018-07-20 MED ORDER — LIDOCAINE-EPINEPHRINE 2 %-1:100000 IJ SOLN
INTRAMUSCULAR | Status: AC | PRN
  Administered 2018-07-20: 10 mL

## 2018-07-20 MED ORDER — CEFAZOLIN SODIUM-DEXTROSE 2-4 GM/100ML-% IV SOLN
2.0000 g | INTRAVENOUS | Status: AC
  Administered 2018-07-20: 13:00:00 2 g via INTRAVENOUS

## 2018-07-20 MED ORDER — LIDOCAINE HCL (PF) 1 % IJ SOLN
INTRAMUSCULAR | Status: AC | PRN
  Administered 2018-07-20: 5 mL

## 2018-07-20 MED ORDER — FENTANYL CITRATE (PF) 100 MCG/2ML IJ SOLN
INTRAMUSCULAR | Status: AC | PRN
  Administered 2018-07-20 (×2): 50 ug via INTRAVENOUS

## 2018-07-20 MED ORDER — LIDOCAINE-EPINEPHRINE 1 %-1:100000 IJ SOLN
INTRAMUSCULAR | Status: AC
  Filled 2018-07-20: qty 1

## 2018-07-20 NOTE — Discharge Instructions (Signed)
Implanted Port Insertion, Care After °This sheet gives you information about how to care for yourself after your procedure. Your health care provider may also give you more specific instructions. If you have problems or questions, contact your health care provider. °What can I expect after the procedure? °After the procedure, it is common to have: °· Discomfort at the port insertion site. °· Bruising on the skin over the port. This should improve over 3-4 days. °Follow these instructions at home: °Port care °· After your port is placed, you will get a manufacturer's information card. The card has information about your port. Keep this card with you at all times. °· Take care of the port as told by your health care provider. Ask your health care provider if you or a family member can get training for taking care of the port at home. A home health care nurse may also take care of the port. °· Make sure to remember what type of port you have. °Incision care ° °  ° °· Follow instructions from your health care provider about how to take care of your port insertion site. Make sure you: °? Wash your hands with soap and water before and after you change your bandage (dressing). If soap and water are not available, use hand sanitizer. °? Change your dressing as told by your health care provider. °? Leave stitches (sutures), skin glue, or adhesive strips in place. These skin closures may need to stay in place for 2 weeks or longer. If adhesive strip edges start to loosen and curl up, you may trim the loose edges. Do not remove adhesive strips completely unless your health care provider tells you to do that. °· Check your port insertion site every day for signs of infection. Check for: °? Redness, swelling, or pain. °? Fluid or blood. °? Warmth. °? Pus or a bad smell. °Activity °· Return to your normal activities as told by your health care provider. Ask your health care provider what activities are safe for you. °· Do not  lift anything that is heavier than 10 lb (4.5 kg), or the limit that you are told, until your health care provider says that it is safe. °General instructions °· Take over-the-counter and prescription medicines only as told by your health care provider. °· Do not take baths, swim, or use a hot tub until your health care provider approves. Ask your health care provider if you may take showers. You may only be allowed to take sponge baths. °· Do not drive for 24 hours if you were given a sedative during your procedure. °· Wear a medical alert bracelet in case of an emergency. This will tell any health care providers that you have a port. °· Keep all follow-up visits as told by your health care provider. This is important. °Contact a health care provider if: °· You cannot flush your port with saline as directed, or you cannot draw blood from the port. °· You have a fever or chills. °· You have redness, swelling, or pain around your port insertion site. °· You have fluid or blood coming from your port insertion site. °· Your port insertion site feels warm to the touch. °· You have pus or a bad smell coming from the port insertion site. °Get help right away if: °· You have chest pain or shortness of breath. °· You have bleeding from your port that you cannot control. °Summary °· Take care of the port as told by your health   care provider. Keep the manufacturer's information card with you at all times. °· Change your dressing as told by your health care provider. °· Contact a health care provider if you have a fever or chills or if you have redness, swelling, or pain around your port insertion site. °· Keep all follow-up visits as told by your health care provider. °This information is not intended to replace advice given to you by your health care provider. Make sure you discuss any questions you have with your health care provider. °Document Released: 10/19/2012 Document Revised: 07/27/2017 Document Reviewed:  07/27/2017 °[employer] Patient Education © 2020 [employer] ° °Moderate Conscious Sedation, Adult, Care After °These instructions provide you with information about caring for yourself after your procedure. Your health care provider may also give you more specific instructions. Your treatment has been planned according to current medical practices, but problems sometimes occur. Call your health care provider if you have any problems or questions after your procedure. °What can I expect after the procedure? °After your procedure, it is common: °· To feel sleepy for several hours. °· To feel clumsy and have poor balance for several hours. °· To have poor judgment for several hours. °· To vomit if you eat too soon. °Follow these instructions at home: °For at least 24 hours after the procedure: ° °· Do not: °? Participate in activities where you could fall or become injured. °? Drive. °? Use heavy machinery. °? Drink alcohol. °? Take sleeping pills or medicines that cause drowsiness. °? Make important decisions or sign legal documents. °? Take care of children on your own. °· Rest. °Eating and drinking °· Follow the diet recommended by your health care provider. °· If you vomit: °? Drink water, juice, or soup when you can drink without vomiting. °? Make sure you have little or no nausea before eating solid foods. °General instructions °· Have a responsible adult stay with you until you are awake and alert. °· Take over-the-counter and prescription medicines only as told by your health care provider. °· If you smoke, do not smoke without supervision. °· Keep all follow-up visits as told by your health care provider. This is important. °Contact a health care provider if: °· You keep feeling nauseous or you keep vomiting. °· You feel light-headed. °· You develop a rash. °· You have a fever. °Get help right away if: °· You have trouble breathing. °This information is not intended to replace advice given to you by your  health care provider. Make sure you discuss any questions you have with your health care provider. °Document Released: 10/19/2012 Document Revised: 12/11/2016 Document Reviewed: 04/20/2015 °[employer] Patient Education © 2020 [employer] ° °

## 2018-07-20 NOTE — H&P (Signed)
Chief Complaint: Patient was seen in consultation today for pancreatic adenocarcinoma  Referring Physician(s): Feng,Yan  Supervising Physician: Daryll Brod  Patient Status: [facility] - Out-pt  History of Present Illness: Taylor Oconnor is a 68 y.o. female with past medical history of HTN, GERD, DM II, right breast cancer on maintenance Anastrozole since 05/2015 who recently presented to [facility] ED with abdominal labs and jaundice. She was found to have obstructive jaundice due to adenocarcinoma of the pancreatic head with indeterminate liver lesions.  Patient now has plans for upcoming chemotherapy.  She is in need of durable venous access.   She presents to radiology today in her usual state of health.  She has been NPO.  She does not take blood thinners.   Past Medical History:  Diagnosis Date   Allergy    SEASONAL   Anxiety    At risk for difficult insertion of breathing tube    Breast cancer of upper-outer quadrant of right female breast ([place]) 12/03/2014   Difficult intubation pt has large tonsils,   GERD (gastroesophageal reflux disease)    hx - resolved with lap band surgery   H/O hiatal hernia    resolved with band surgery   H/O laparoscopic adjustable gastric banding 2010   History of kidney stones 02/2018   Hyperlipidemia    Hypertension    Hypokalemia    Insomnia    tx with zanax   Obesity    Pancreatic mass    PCOS (polycystic ovarian syndrome)    Personal history of radiation therapy    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) 12/14/2012   Type II or unspecified type diabetes mellitus without mention of complication, not stated as uncontrolled 12/14/2012   no meds, diet controlled   Wears contact lenses    Wears glasses     Past Surgical History:  Procedure Laterality Date   BACK SURGERY     cervical   BILIARY STENT PLACEMENT N/A 07/01/2018   Procedure: BILIARY STENT PLACEMENT;  Surgeon: Ladene Artist, MD;  Location: [facility];   Service: Endoscopy;  Laterality: N/A;  cbd brushing   BREAST BIOPSY     BREAST LUMPECTOMY Right 2016   BREAST LUMPECTOMY WITH NEEDLE LOCALIZATION AND AXILLARY SENTINEL LYMPH NODE BX Right 12/28/2014   Procedure: RIGHT BREAST LUMPECTOMY WITH TWO (2) NEEDLE LOCALIZATION AND AXILLARY SENTINEL LYMPH NODE BX;  Surgeon: Alphonsa Overall, MD;  Location: [place];  Service: General;  Laterality: Right;   CESAREAN SECTION     x 1   CHOLECYSTECTOMY     COLONOSCOPY  09/29/2004   normal   CYSTOSCOPY WITH RETROGRADE PYELOGRAM, URETEROSCOPY AND STENT PLACEMENT Right 03/24/2018   Procedure: CYSTOSCOPY WITH RETROGRADE PYELOGRAM, URETEROSCOPY AND STENT PLACEMENT;  Surgeon: Franchot Gallo, MD;  Location: [facility];  Service: Urology;  Laterality: Right;  1 HR   DIAGNOSTIC LAPAROSCOPY     ERCP N/A 07/01/2018   Procedure: ENDOSCOPIC RETROGRADE CHOLANGIOPANCREATOGRAPHY (ERCP);  Surgeon: Ladene Artist, MD;  Location: [facility];  Service: Endoscopy;  Laterality: N/A;   ESOPHAGOGASTRODUODENOSCOPY (EGD) WITH PROPOFOL N/A 07/14/2018   Procedure: ESOPHAGOGASTRODUODENOSCOPY (EGD) WITH PROPOFOL;  Surgeon: Milus Banister, MD;  Location: [facility];  Service: Endoscopy;  Laterality: N/A;   EUS N/A 07/14/2018   Procedure: UPPER ENDOSCOPIC ULTRASOUND (EUS) RADIAL;  Surgeon: Milus Banister, MD;  Location: [facility];  Service: Endoscopy;  Laterality: N/A;   FINE NEEDLE ASPIRATION  07/14/2018   Procedure: FINE NEEDLE ASPIRATION (FNA) RADIAL;  Surgeon: Milus Banister, MD;  Location: [facility];  Service: Endoscopy;;   HERNIA REPAIR  2008   LAPAROSCOPIC APPENDECTOMY N/A 08/06/2012   Procedure: APPENDECTOMY LAPAROSCOPIC;  Surgeon: Earnstine Regal, MD;  Location: [facility];  Service: General;  Laterality: N/A;   LAPAROSCOPIC GASTRIC BANDING WITH HIATAL HERNIA REPAIR N/A Nov 2008   SPHINCTEROTOMY  07/01/2018   Procedure: SPHINCTEROTOMY;  Surgeon: Ladene Artist, MD;  Location:  [facility];  Service: Endoscopy;;   TUBAL LIGATION      Allergies: Codeine, Lipitor [atorvastatin], and Ace inhibitors  Medications: Prior to Admission medications   Medication Sig Start Date End Date Taking? Authorizing Provider  acetaminophen (TYLENOL) 500 MG tablet Take 1,000 mg by mouth 2 (two) times a day.    [provider]  anastrozole (ARIMIDEX) 1 MG tablet TAKE 1 TABLET BY MOUTH EVERY DAY Patient taking differently: Take 1 mg by mouth daily.  03/21/18   Gardenia Phlegm, NP  aspirin EC 81 MG tablet Take 81 mg by mouth daily.    [provider]  atenolol (TENORMIN) 100 MG tablet TAKE 1 TABLET BY MOUTH EVERY DAY Patient taking differently: Take 50 mg by mouth daily after supper.  03/19/18   Unk Pinto, MD  Cholecalciferol (VITAMIN D3) 5000 UNITS CAPS Take by mouth daily after supper.     [provider]  lidocaine-prilocaine (EMLA) cream Apply to affected area once 07/15/18   Truitt Merle, MD  ondansetron (ZOFRAN) 4 MG tablet Take 1 tablet (4 mg total) by mouth every 8 (eight) hours as needed for nausea or vomiting. 02/27/18   Rolland Porter, MD  ondansetron (ZOFRAN) 8 MG tablet Take 1 tablet (8 mg total) by mouth 2 (two) times daily as needed. Start on day 3 after chemotherapy. 07/15/18   Truitt Merle, MD  prochlorperazine (COMPAZINE) 10 MG tablet Take 1 tablet (10 mg total) by mouth every 6 (six) hours as needed (Nausea or vomiting). 07/15/18   Truitt Merle, MD  promethazine (PHENERGAN) 25 MG tablet Take 1/2-1 tab every 6 hours as needed for nausea. Patient taking differently: Take 12.5-25 mg by mouth every 6 (six) hours as needed for nausea.  06/29/18   Liane Comber, NP  rosuvastatin (CRESTOR) 40 MG tablet Take 1 tablet daily for Cholesterol Patient taking differently: Take 10 mg by mouth daily.  03/19/18   Unk Pinto, MD  traZODone (DESYREL) 150 MG tablet Take 1/2 to 1 tablet 1 to 2 hours before Bedtime Patient taking differently: Take 150 mg by mouth.   06/01/18   Unk Pinto, MD  verapamil (CALAN) 80 MG tablet Take 1 tablet 2 x /day with meal for BP Patient taking differently: Take 80 mg by mouth 2 (two) times daily with a meal.  02/14/18   Unk Pinto, MD  Zinc 50 MG TABS Take 50 mg by mouth daily after supper.    [provider]     Family History  Problem Relation Age of Onset   Cirrhosis Mother    Alcohol abuse Mother    Liver disease Mother    Diabetes Father    Hypertension Father    Stroke Father    Heart disease Father    AAA (abdominal aortic aneurysm) Sister    Cancer Cousin        Breast   Breast cancer Cousin        x3   Colon cancer Neg Hx    Rectal cancer Neg Hx    Stomach cancer Neg Hx  Social History   Socioeconomic History   Marital status: Married    Spouse name: Not on file   Number of children: 1   Years of education: Not on file   Highest education level: Not on file  Occupational History   Not on file  Social Needs   Financial resource strain: Not on file   Food insecurity    Worry: Not on file    Inability: Not on file   Transportation needs    Medical: Not on file    Non-medical: Not on file  Tobacco Use   Smoking status: Never Smoker   Smokeless tobacco: Never Used  Substance and Sexual Activity   Alcohol use: No    Alcohol/week: 0.0 standard drinks   Drug use: No   Sexual activity: Not Currently    Birth control/protection: Post-menopausal  Lifestyle   Physical activity    Days per week: Not on file    Minutes per session: Not on file   Stress: Not on file  Relationships   Social connections    Talks on phone: Not on file    Gets together: Not on file    Attends religious service: Not on file    Active member of club or organization: Not on file    Attends meetings of clubs or organizations: Not on file    Relationship status: Not on file  Other Topics Concern   Not on file  Social History Narrative   Not on file      Review of Systems: A 12 point ROS discussed and pertinent positives are indicated in the HPI above.  All other systems are negative.  Review of Systems  Constitutional: Negative for fatigue and fever.  Respiratory: Negative for cough and shortness of breath.   Cardiovascular: Negative for chest pain.  Gastrointestinal: Negative for abdominal pain.  Genitourinary: Negative for dysuria.  Musculoskeletal: Negative for back pain.  Psychiatric/Behavioral: Negative for behavioral problems and confusion.    Vital Signs: BP (!) 152/92 (BP Location: Left Arm)    Pulse 77    Temp 98.3 F (36.8 C) (Oral)    Resp 18    SpO2 98%   Physical Exam Vitals signs and nursing note reviewed.  Constitutional:      Appearance: Normal appearance.  HENT:     Mouth/Throat:     Mouth: Mucous membranes are moist.     Pharynx: Oropharynx is clear.  Neck:     Musculoskeletal: Normal range of motion and neck supple.  Cardiovascular:     Rate and Rhythm: Normal rate and regular rhythm.  Pulmonary:     Effort: Pulmonary effort is normal. No respiratory distress.     Breath sounds: Normal breath sounds.  Neurological:     General: No focal deficit present.     Mental Status: She is alert and oriented to person, place, and time. Mental status is at baseline.  Psychiatric:        Mood and Affect: Mood normal.        Behavior: Behavior normal.        Thought Content: Thought content normal.        Judgment: Judgment normal.      MD Evaluation Airway: WNL Heart: WNL Abdomen: WNL Chest/ Lungs: WNL ASA  Classification: 3 Mallampati/Airway Score: Two   Imaging: Ct Abdomen Pelvis Wo Contrast  Result Date: 06/30/2018 CLINICAL DATA:  Abdominal pain and swelling, decreased appetite. Abnormal lab work. EXAM: CT ABDOMEN AND PELVIS WITHOUT CONTRAST  TECHNIQUE: Multidetector CT imaging of the abdomen and pelvis was performed following the standard protocol without IV contrast. COMPARISON:  CT abdomen  dated 02/27/2018. FINDINGS: Lower chest: No acute abnormality. Hepatobiliary: New common bile duct dilatation and intrahepatic bile duct dilatation. CBD measures 1.7 cm diameter. No focal liver abnormality. Status post cholecystectomy. Pancreas: New fullness of the pancreatic head. No pancreatic ductal dilatation seen. Spleen: Normal in size without focal abnormality. Adrenals/Urinary Tract: Adrenal glands appear normal. Kidneys are unremarkable without mass, stone or hydronephrosis. No ureteral or bladder calculi identified. Bladder is decompressed. Stomach/Bowel: No dilated large or small bowel loops. No evidence of bowel wall inflammation. Status post appendectomy. Lap band appears stable in position/configuration without evidence of complicating feature. Stomach otherwise unremarkable. Vascular/Lymphatic: No significant vascular findings are present. No enlarged abdominal or pelvic lymph nodes. Reproductive: Uterus and bilateral adnexa are unremarkable. Other: No free fluid or abscess collection seen. No free intraperitoneal air. Musculoskeletal: No acute or suspicious osseous finding. Mild degenerative spondylosis of the lumbar spine. IMPRESSION: 1. New common bile duct dilatation and intrahepatic bile duct dilatation. New fullness of the pancreatic head which could represent rapidly developing obstructive neoplastic process or sequela of recent localized pancreatitis. Recommend further characterization with MRCP or ERCP. 2. No other acute or significant findings. No bowel obstruction or evidence of bowel wall inflammation. No free fluid or abscess collection. No free intraperitoneal air. 3. Additional chronic/incidental findings detailed above. These results will be called to the ordering clinician or representative by the Radiologist Assistant, and communication documented in the PACS or zVision Dashboard. Electronically Signed   By: Franki Cabot M.D.   On: 06/30/2018 11:23   Mr 3d Recon At  Scanner  Result Date: 07/01/2018 CLINICAL DATA:  Biliary ductal dilatation status post ERCP with stent placement. EXAM: MRI ABDOMEN WITHOUT AND WITH CONTRAST (INCLUDING MRCP) TECHNIQUE: Multiplanar multisequence MR imaging of the abdomen was performed both before and after the administration of intravenous contrast. Heavily T2-weighted images of the biliary and pancreatic ducts were obtained, and three-dimensional MRCP images were rendered by post processing. CONTRAST:  9 cc Gadavist COMPARISON:  06/20/2018 FINDINGS: Lower chest: No acute findings. Hepatobiliary: There is been interval decompression of the dilated bile ducts status post stent placement. Within the caudate lobe of liver there is a 1.8 cm T1 hyperintense and T2 hypointense liver lesion. This demonstrates some mild delayed internal enhancement. This is indeterminate. Adjacent low T1 signal intensity and increased T1 signal intensity structure measures 0.6 cm, image 18/3. No scratch set this exhibits relative hypoenhancement compared to the adjacent liver. A third T2 hyperintense and T1 hypointense structure is identified within segment 5 measuring 0.9 cm. This is too small to reliably characterize. Pancreas: Atrophy of the neck, body and tail of pancreas is identified with increase caliber of the main duct measuring up to 6 mm. The pancreatic parenchymal atrophy demonstrates progression when compared with exam from 02/27/2018. The main duct dilatation is new. Within the head of pancreas there is a mass which is hypoenhancing to the adjacent pancreas measuring 2 by 1.7 by 2.4 cm. This mass abuts the portal venous confluence without definite evidence for encasement. Small filling defect within the superior mesenteric vein measures 0.6 cm, image 52/2. There does not appear to be involvement of the superior mesenteric artery or celiac trunk. There is mild soft tissue stranding surrounding the head of pancreas and extending around the duodenum is noted  concerning for superimposed pancreatitis. Spleen:  Within normal limits in size and appearance.  Adrenals/Urinary Tract: Normal appearance of the adrenal glands. The kidneys are unremarkable. No kidney mass or hydronephrosis. Stomach/Bowel: Lap band noted around the proximal stomach. Visualized small and large bowel loops are unremarkable. Vascular/Lymphatic: Normal appearance of the abdominal aorta. No aneurysm. No adenopathy identified. Other: Small amount of free fluid arising from the area around the head of pancreas noted. No focal fluid collections. Musculoskeletal: No suspicious bone lesions identified. IMPRESSION: 1. There is a hypoenhancing mass involving the head of pancreas. There is associated atrophy of the pancreatic neck, body and tail with dilatation of the main duct. Imaging findings are concerning for pancreatic adenocarcinoma. This mass abuts the portal venous confluence without complete encasement. The main portal vein remains patent. No evidence for SMA or celiac involvement. Further evaluation with endoscopic ultrasound with tissue sampling is advised. 2. There are several indeterminate liver lesions identified. The largest is in the caudate lobe measuring 1.8 cm. Although this exhibits delayed internal enhancement (which may be a feature benign hemangioma) underlying liver metastasis cannot be excluded. The additional, smaller, less than 10 mm liver lesions are too small to reliably characterize. In the event biopsy-proven pancreatic adenocarcinoma consider further staging workup with PET-CT. 3. Suspect focal pancreatitis involving the head of pancreas. 4. Interval decompression of the bile ducts status post stent placement. 5. Small filling defect noted within the superior mesenteric vein which may represent thrombus. The portal vein however remains patent. Electronically Signed   By: Kerby Moors M.D.   On: 07/01/2018 16:12   Dg Ercp  Result Date: 07/01/2018 CLINICAL DATA:  68 year old  female with a history of choledocholithiasis EXAM: ERCP TECHNIQUE: Multiple spot images obtained with the fluoroscopic device and submitted for interpretation post-procedure. FLUOROSCOPY TIME:  Fluoroscopy Time:  3 minutes 15 seconds COMPARISON:  None. FINDINGS: Multiple intraoperative fluoroscopic spot images during ERCP. Initial image demonstrates endoscope projecting over the upper abdomen. Overlying reservoir and tubing for gastric banding. There is then cannulation of the ampulla and retrograde infusion of contrast partially opacifying the extrahepatic biliary ducts. Placement of a plastic biliary stent. IMPRESSION: Limited images during ERCP demonstrates placement of plastic biliary stent in the common bile duct. Please refer to the dictated operative report for full details of intraoperative findings and procedure. Electronically Signed   By: Corrie Mckusick D.O.   On: 07/01/2018 12:34   Mr Abdomen Mrcp Moise Boring Contast  Result Date: 07/01/2018 CLINICAL DATA:  Biliary ductal dilatation status post ERCP with stent placement. EXAM: MRI ABDOMEN WITHOUT AND WITH CONTRAST (INCLUDING MRCP) TECHNIQUE: Multiplanar multisequence MR imaging of the abdomen was performed both before and after the administration of intravenous contrast. Heavily T2-weighted images of the biliary and pancreatic ducts were obtained, and three-dimensional MRCP images were rendered by post processing. CONTRAST:  9 cc Gadavist COMPARISON:  06/20/2018 FINDINGS: Lower chest: No acute findings. Hepatobiliary: There is been interval decompression of the dilated bile ducts status post stent placement. Within the caudate lobe of liver there is a 1.8 cm T1 hyperintense and T2 hypointense liver lesion. This demonstrates some mild delayed internal enhancement. This is indeterminate. Adjacent low T1 signal intensity and increased T1 signal intensity structure measures 0.6 cm, image 18/3. No scratch set this exhibits relative hypoenhancement compared to the  adjacent liver. A third T2 hyperintense and T1 hypointense structure is identified within segment 5 measuring 0.9 cm. This is too small to reliably characterize. Pancreas: Atrophy of the neck, body and tail of pancreas is identified with increase caliber of the main duct measuring  up to 6 mm. The pancreatic parenchymal atrophy demonstrates progression when compared with exam from 02/27/2018. The main duct dilatation is new. Within the head of pancreas there is a mass which is hypoenhancing to the adjacent pancreas measuring 2 by 1.7 by 2.4 cm. This mass abuts the portal venous confluence without definite evidence for encasement. Small filling defect within the superior mesenteric vein measures 0.6 cm, image 52/2. There does not appear to be involvement of the superior mesenteric artery or celiac trunk. There is mild soft tissue stranding surrounding the head of pancreas and extending around the duodenum is noted concerning for superimposed pancreatitis. Spleen:  Within normal limits in size and appearance. Adrenals/Urinary Tract: Normal appearance of the adrenal glands. The kidneys are unremarkable. No kidney mass or hydronephrosis. Stomach/Bowel: Lap band noted around the proximal stomach. Visualized small and large bowel loops are unremarkable. Vascular/Lymphatic: Normal appearance of the abdominal aorta. No aneurysm. No adenopathy identified. Other: Small amount of free fluid arising from the area around the head of pancreas noted. No focal fluid collections. Musculoskeletal: No suspicious bone lesions identified. IMPRESSION: 1. There is a hypoenhancing mass involving the head of pancreas. There is associated atrophy of the pancreatic neck, body and tail with dilatation of the main duct. Imaging findings are concerning for pancreatic adenocarcinoma. This mass abuts the portal venous confluence without complete encasement. The main portal vein remains patent. No evidence for SMA or celiac involvement. Further  evaluation with endoscopic ultrasound with tissue sampling is advised. 2. There are several indeterminate liver lesions identified. The largest is in the caudate lobe measuring 1.8 cm. Although this exhibits delayed internal enhancement (which may be a feature benign hemangioma) underlying liver metastasis cannot be excluded. The additional, smaller, less than 10 mm liver lesions are too small to reliably characterize. In the event biopsy-proven pancreatic adenocarcinoma consider further staging workup with PET-CT. 3. Suspect focal pancreatitis involving the head of pancreas. 4. Interval decompression of the bile ducts status post stent placement. 5. Small filling defect noted within the superior mesenteric vein which may represent thrombus. The portal vein however remains patent. Electronically Signed   By: Kerby Moors M.D.   On: 07/01/2018 16:12    Labs:  CBC: Recent Labs    06/29/18 1136 06/30/18 1438 07/01/18 0515 07/02/18 0419  WBC 6.7 7.2 5.7 9.8  HGB 15.1 14.5 13.3 13.8  HCT 44.8 43.2 40.1 42.7  PLT 356 311 269 285    COAGS: Recent Labs    07/01/18 0515  INR 0.9    BMP: Recent Labs    06/29/18 1136 06/30/18 1438 07/01/18 0515 07/02/18 0419  NA 139 139 138 139  K 3.7 3.4* 3.8 4.0  CL 100 103 103 103  CO2 27 24 22 24   GLUCOSE 141* 177* 126* 160*  BUN 10 16 15 15   CALCIUM 9.6 9.1 9.0 9.2  CREATININE 0.72 0.66 0.54 0.73  GFRNONAA 87 >60 >60 >60  GFRAA 101 >60 >60 >60    LIVER FUNCTION TESTS: Recent Labs    12/02/17 1457  06/29/18 1136 06/30/18 1438 07/01/18 0515 07/02/18 0419  BILITOT 0.4   < > 6.8* 8.0* 7.6* 3.0*  AST 17   < > 256* 193* 193* 66*  ALT 19   < > 807* 591* 470* 339*  ALKPHOS 65  --   --  368* 367* 344*  PROT 6.7   < > 6.7 7.5 6.5 6.4*  ALBUMIN 3.7  --   --  4.0 3.3* 3.2*   < > =  values in this interval not displayed.    TUMOR MARKERS: No results for input(s): AFPTM, CEA, CA199, CHROMGRNA in the last 8760 hours.  Assessment and  Plan: Patient with past medical history of breast cancer, HTN, DM presents with complaint of newly diagnosed pancreatic cancer.  IR consulted for Port-A-Cath placement at the request of Dr. Burr Medico. Case reviewed by Dr. Annamaria Boots who approves patient for procedure.  Patient presents today in their usual state of health.  She has been NPO and is not currently on blood thinners.   Risks and benefits of image guided port-a-catheter placement was discussed with the patient including, but not limited to bleeding, infection, pneumothorax, or fibrin sheath development and need for additional procedures.  All of the patient's questions were answered, patient is agreeable to proceed. Consent signed and in chart.   Thank you for this interesting consult.  I greatly enjoyed meeting Taylor Oconnor and look forward to participating in their care.  A copy of this report was sent to the requesting provider on this date.  Electronically Signed: Docia Barrier, PA 07/20/2018, 11:22 AM   I spent a total of  30 Minutes   in face to face in clinical consultation, greater than 50% of which was counseling/coordinating care for pancreatic adenocarcinoma.

## 2018-07-20 NOTE — Procedures (Signed)
panc ca  S/p RT IJ POWER PORT  TIP SVCRA NO COMP STABLE EBL MIN READY FOR USE

## 2018-07-21 ENCOUNTER — Encounter (HOSPITAL_COMMUNITY)
Admission: RE | Admit: 2018-07-21 | Discharge: 2018-07-21 | Disposition: A | Discharge status: Home / Self Care | Payer: Medicare Other | Source: Ambulatory Visit | Attending: Hematology | Admitting: Hematology

## 2018-07-21 ENCOUNTER — Telehealth: Payer: Self-pay | Admitting: Hematology

## 2018-07-21 ENCOUNTER — Telehealth: Payer: Self-pay | Admitting: Genetic Counselor

## 2018-07-21 ENCOUNTER — Telehealth: Payer: Self-pay

## 2018-07-21 DIAGNOSIS — D490 Neoplasm of unspecified behavior of digestive system: Secondary | ICD-10-CM | POA: Diagnosis not present

## 2018-07-21 DIAGNOSIS — Z853 Personal history of malignant neoplasm of breast: Secondary | ICD-10-CM | POA: Insufficient documentation

## 2018-07-21 DIAGNOSIS — I700 Atherosclerosis of aorta: Secondary | ICD-10-CM | POA: Insufficient documentation

## 2018-07-21 DIAGNOSIS — E282 Polycystic ovarian syndrome: Secondary | ICD-10-CM | POA: Diagnosis not present

## 2018-07-21 DIAGNOSIS — C787 Secondary malignant neoplasm of liver and intrahepatic bile duct: Secondary | ICD-10-CM | POA: Diagnosis not present

## 2018-07-21 DIAGNOSIS — Z923 Personal history of irradiation: Secondary | ICD-10-CM | POA: Diagnosis not present

## 2018-07-21 DIAGNOSIS — N182 Chronic kidney disease, stage 2 (mild): Secondary | ICD-10-CM | POA: Insufficient documentation

## 2018-07-21 DIAGNOSIS — Z9884 Bariatric surgery status: Secondary | ICD-10-CM | POA: Diagnosis not present

## 2018-07-21 DIAGNOSIS — I129 Hypertensive chronic kidney disease with stage 1 through stage 4 chronic kidney disease, or unspecified chronic kidney disease: Secondary | ICD-10-CM | POA: Diagnosis not present

## 2018-07-21 DIAGNOSIS — E785 Hyperlipidemia, unspecified: Secondary | ICD-10-CM | POA: Insufficient documentation

## 2018-07-21 DIAGNOSIS — E1122 Type 2 diabetes mellitus with diabetic chronic kidney disease: Secondary | ICD-10-CM | POA: Insufficient documentation

## 2018-07-21 DIAGNOSIS — C259 Malignant neoplasm of pancreas, unspecified: Secondary | ICD-10-CM | POA: Diagnosis not present

## 2018-07-21 LAB — GLUCOSE, CAPILLARY: Glucose-Capillary: 157 mg/dL — ABNORMAL HIGH (ref 70–99)

## 2018-07-21 MED ORDER — FLUDEOXYGLUCOSE F - 18 (FDG) INJECTION
9.1400 | Freq: Once | INTRAVENOUS | Status: AC | PRN
  Administered 2018-07-21: 9.14 via INTRAVENOUS

## 2018-07-21 NOTE — Telephone Encounter (Signed)
Called patient regarding upcoming Webex appointment, screen run complete and e-mail has been sent. Labs have been cancelled and a salvia kit will be sent.

## 2018-07-21 NOTE — Telephone Encounter (Signed)
Spoke with patient to assess for navigation needs. Patient has multiple questions about cancer treatment which were addressed to her satisfaction. She understands that information will be presented to her in detail during chemo ed class. Patient has my contact information for questions. Will follow as needed.

## 2018-07-22 ENCOUNTER — Telehealth: Payer: Self-pay | Admitting: Genetic Counselor

## 2018-07-22 ENCOUNTER — Telehealth: Payer: Self-pay | Admitting: *Deleted

## 2018-07-22 ENCOUNTER — Inpatient Hospital Stay: Payer: Medicare Other

## 2018-07-25 ENCOUNTER — Inpatient Hospital Stay (HOSPITAL_BASED_OUTPATIENT_CLINIC_OR_DEPARTMENT_OTHER): Payer: Medicare Other | Admitting: Genetic Counselor

## 2018-07-25 ENCOUNTER — Other Ambulatory Visit: Payer: Self-pay | Admitting: Genetic Counselor

## 2018-07-25 ENCOUNTER — Telehealth: Payer: Self-pay | Admitting: Hematology

## 2018-07-25 ENCOUNTER — Inpatient Hospital Stay: Payer: Medicare Other

## 2018-07-25 DIAGNOSIS — Z803 Family history of malignant neoplasm of breast: Secondary | ICD-10-CM | POA: Diagnosis not present

## 2018-07-25 DIAGNOSIS — Z170 Estrogen receptor positive status [ER+]: Secondary | ICD-10-CM | POA: Diagnosis not present

## 2018-07-25 DIAGNOSIS — C50411 Malignant neoplasm of upper-outer quadrant of right female breast: Secondary | ICD-10-CM | POA: Diagnosis not present

## 2018-07-25 NOTE — Telephone Encounter (Signed)
I talk with patient regarding schedule  

## 2018-07-25 NOTE — Progress Notes (Signed)
[place]   [phone] [phone]   Clinic Follow up Note   Patient Care Team: Unk Pinto, MD as PCP - General (Internal Medicine) Alphonsa Overall, MD as Consulting Physician (General Surgery) Magrinat, Virgie Dad, MD as Consulting Physician (Oncology) Thea Silversmith, MD as Consulting Physician (Radiation Oncology) Sylvan Cheese, NP as Nurse Practitioner (Hematology and Oncology) Mauri Pole, MD as Consulting Physician (Gastroenterology) Arna Snipe, RN as Oncology Nurse Navigator  Date of Service:  07/26/2018  CHIEF COMPLAINT: F/u of Adenocarcinoma of Pancreatic Head  SUMMARY OF ONCOLOGIC HISTORY: Oncology History  Malignant neoplasm of upper-outer quadrant of right breast in female, estrogen receptor positive ([place])  11/15/2014 Mammogram   Right breast: mass warranting further evaluation   11/27/2014 Breast US   0.4 x 0.4 x 0.6 cm irregular hypoechoic mass within the right breast 10 o'clock position 7 cm from the nipple. Additionally there is a 0.7 x 0.8 x 0.8 cm taller than wide irregular hypoechoic mass with posterior acoustic shadowing within the right breast.   11/28/2014 Initial Biopsy   Right breast core needle biopsies, 2 masses: both invasive ductal carcinoma, grade 1, ER+ (100%), PR+ (85% -larger), HER2/neu negative, Ki67 5-10%.  Smaller mass PR-   11/28/2014 Clinical Stage   Stage IA: T1b N0   12/28/2014 Definitive Surgery   Right lumpectomies/SLNB: invasive ductal carcinoma, grade 2, repeat HER2/neu negative, 0/2 LN   12/28/2014 Pathologic Stage   Stage IA: npT1c pN0   12/28/2014 Oncotype testing   RS 16 (10% ROR)   02/18/2015 - 04/03/2015 Radiation Therapy    Right breast / 45 Gray at 1.8 [place] Cables per fraction x 25 fractions; right breast boost / 16 Gray at [employer] per fraction x 8 fractions   05/13/2015 -  Anti-estrogen oral therapy   Anastrozole 1 mg daily.   05/30/2015 Survivorship   SCP mailed to  patient in lieu of in person visit   Pancreatic cancer ([place])  06/30/2018 Imaging   CT AP WO Contrast 06/30/18  IMPRESSION: 1. New common bile duct dilatation and intrahepatic bile duct dilatation. New fullness of the pancreatic head which could represent rapidly developing obstructive neoplastic process or sequela of recent localized pancreatitis. Recommend further characterization with MRCP or ERCP. 2. No other acute or significant findings. No bowel obstruction or evidence of bowel wall inflammation. No free fluid or abscess collection. No free intraperitoneal air. 3. Additional chronic/incidental findings detailed above.   07/01/2018 Procedure   ERCP by Dr Fuller Plan on 07/01/18  IMPRESSION - A single severe segmental biliary stricture was found in the lower third of the common bile duct. The stricture was malignant appearing. Brushed. - The entire biliary tree proximal to the stricture was dilated, secondary to the stricture. - Prior cholecystectomy. - A biliary sphincterotomy was performed. - One plastic stent was placed into the common bile duct.   07/01/2018 Pathology Results   Diagnosis 07/01/18 BILE DUCT BRUSHING (SPECIMEN 1 OF 1 COLLECTED 07/01/2018) SUSPICIOUS FOR ADENOCARCINOMA. SEE COMMENT.   07/14/2018 Initial Diagnosis   Pancreatic cancer ([place])   07/14/2018 Procedure   EUS by Dr Ardis Hughs on 07/14/18 IMPRESSION PENDING    07/14/2018 Cancer Staging   Staging form: Exocrine Pancreas, AJCC 8th Edition - Clinical: Stage IB (cT2, cN0, cM0) - Signed by Truitt Merle, MD on 07/15/2018   07/21/2018 PET scan   IMPRESSION: 1. Mass in head of pancreas is intensely hypermetabolic compatible with primary pancreatic adenocarcinoma. 2. Enlarged and FDG avid porta hepatic lymph node  compatible with metastatic adenopathy. 3. Extensive FDG avid liver metastases involve both lobes of liver and exhibit marked progression when compared with MRI from 07/01/2018. 4.  Aortic Atherosclerosis  (ICD10-I70.0).   07/26/2018 -  Chemotherapy   FOLFIRINOX q2weeks starting 07/26/18      CURRENT THERAPY:  FOLFIRINOX q2weeks starting 07/26/18   INTERVAL HISTORY:  Taylor Oconnor is here for a follow up and treatment. She presents to the clinic alone. She notes she has mid back pain which she has had today which comes and goes. She had epigastric pain which comes and goes. Was 8/10 yesterday that lasted 4 hours and gone today. She did take Tylenol which little help.  She is interested in pain meds.    REVIEW OF SYSTEMS:   Constitutional: Denies fevers, chills or abnormal weight loss Eyes: Denies blurriness of vision Ears, nose, mouth, throat, and face: Denies mucositis or sore throat Respiratory: Denies cough, dyspnea or wheezes Cardiovascular: Denies palpitation, chest discomfort or lower extremity swelling Gastrointestinal:  Denies nausea, heartburn or change in bowel habits (+) Intermittent epigastric pain  MSK: (+) occasional mid back pain  Skin: Denies abnormal skin rashes Lymphatics: Denies new lymphadenopathy or easy bruising Neurological:Denies numbness, tingling or new weaknesses Behavioral/Psych: Mood is stable, no new changes  All other systems were reviewed with the patient and are negative.  MEDICAL HISTORY:  Past Medical History:  Diagnosis Date  . Allergy    SEASONAL  . Anxiety   . At risk for difficult insertion of breathing tube   . Breast cancer of upper-outer quadrant of right female breast ([place]) 12/03/2014  . 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 Family history of breast cancer   . GERD (gastroesophageal reflux disease)    hx - resolved with lap band surgery  . H/O hiatal hernia    resolved with band surgery  . H/O laparoscopic adjustable gastric banding 2010  . History of kidney stones 02/2018  . Hyperlipidemia   . Hypertension   . Hypokalemia   . Insomnia    tx with zanax  . Obesity   . Pancreatic mass   . PCOS (polycystic ovarian  syndrome)   . Personal history of radiation therapy   . 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 Type II or unspecified type diabetes mellitus without mention of complication, not stated as uncontrolled 12/14/2012   no meds, diet controlled  . Wears contact lenses   . Wears glasses     SURGICAL HISTORY: Past Surgical History:  Procedure Laterality Date  . BACK SURGERY     cervical  . BILIARY STENT PLACEMENT N/A 07/01/2018   Procedure: BILIARY STENT PLACEMENT;  Surgeon: Ladene Artist, MD;  Location: [facility];  Service: Endoscopy;  Laterality: N/A;  cbd brushing  . BREAST BIOPSY    . BREAST LUMPECTOMY Right 2016  . BREAST LUMPECTOMY WITH NEEDLE LOCALIZATION AND AXILLARY SENTINEL LYMPH NODE BX Right 12/28/2014   Procedure: RIGHT BREAST LUMPECTOMY WITH TWO (2) NEEDLE LOCALIZATION AND AXILLARY SENTINEL LYMPH NODE BX;  Surgeon: Alphonsa Overall, MD;  Location: [facility];  Service: General;  Laterality: Right;  . CESAREAN SECTION     x 1  . CHOLECYSTECTOMY    . COLONOSCOPY  09/29/2004   normal  . CYSTOSCOPY WITH RETROGRADE PYELOGRAM, URETEROSCOPY AND STENT PLACEMENT Right 03/24/2018   Procedure: CYSTOSCOPY WITH RETROGRADE PYELOGRAM, URETEROSCOPY AND STENT PLACEMENT;  Surgeon: Franchot Gallo, MD;  Location: [facility];  Service: Urology;  Laterality: Right;  1 HR  . DIAGNOSTIC LAPAROSCOPY    . ERCP N/A 07/01/2018   Procedure: ENDOSCOPIC RETROGRADE CHOLANGIOPANCREATOGRAPHY (ERCP);  Surgeon: Ladene Artist, MD;  Location: [facility];  Service: Endoscopy;  Laterality: N/A;  . ESOPHAGOGASTRODUODENOSCOPY (EGD) WITH PROPOFOL N/A 07/14/2018   Procedure: ESOPHAGOGASTRODUODENOSCOPY (EGD) WITH PROPOFOL;  Surgeon: Milus Banister, MD;  Location: [facility];  Service: Endoscopy;  Laterality: N/A;  . EUS N/A 07/14/2018   Procedure: UPPER ENDOSCOPIC ULTRASOUND (EUS) RADIAL;  Surgeon: Milus Banister, MD;  Location: [facility];  Service: Endoscopy;   Laterality: N/A;  . FINE NEEDLE ASPIRATION  07/14/2018   Procedure: FINE NEEDLE ASPIRATION (FNA) RADIAL;  Surgeon: Milus Banister, MD;  Location: [facility];  Service: Endoscopy;;  . HERNIA REPAIR  2008  . IR IMAGING GUIDED PORT INSERTION  07/20/2018  . LAPAROSCOPIC APPENDECTOMY N/A 08/06/2012   Procedure: APPENDECTOMY LAPAROSCOPIC;  Surgeon: Earnstine Regal, MD;  Location: [facility];  Service: General;  Laterality: N/A;  . LAPAROSCOPIC GASTRIC BANDING WITH HIATAL HERNIA REPAIR N/A Nov 2008  . SPHINCTEROTOMY  07/01/2018   Procedure: SPHINCTEROTOMY;  Surgeon: Ladene Artist, MD;  Location: [facility];  Service: Endoscopy;;  . TUBAL LIGATION      I have reviewed the social history and family history with the patient and they are unchanged from previous note.  ALLERGIES:  is allergic to codeine; lipitor [atorvastatin]; and ace inhibitors.  MEDICATIONS:  Current Outpatient Medications  Medication Sig Dispense Refill  . acetaminophen (TYLENOL) 500 MG tablet Take 1,000 mg by mouth 2 (two) times a day.    . anastrozole (ARIMIDEX) 1 MG tablet TAKE 1 TABLET BY MOUTH EVERY DAY (Patient taking differently: Take 1 mg by mouth daily. ) 90 tablet 3  . aspirin EC 81 MG tablet Take 81 mg by mouth daily.    Marland Kitchen atenolol (TENORMIN) 100 MG tablet TAKE 1 TABLET BY MOUTH EVERY DAY (Patient taking differently: Take 50 mg by mouth daily after supper. ) 90 tablet 3  . Cholecalciferol (VITAMIN D3) 5000 UNITS CAPS Take by mouth daily after supper.     . lidocaine-prilocaine (EMLA) cream Apply to affected area once 30 g 3  . promethazine (PHENERGAN) 25 MG tablet Take 1/2-1 tab every 6 hours as needed for nausea. 60 tablet 3  . rosuvastatin (CRESTOR) 10 MG tablet     . traZODone (DESYREL) 150 MG tablet Take 1/2 to 1 tablet 1 to 2 hours before Bedtime (Patient taking differently: Take 150 mg by mouth. ) 90 tablet 3  . verapamil (CALAN) 80 MG tablet Take 1 tablet 2 x /day with meal for BP (Patient taking differently: Take  80 mg by mouth 2 (two) times daily with a meal. ) 180 tablet 3  . Zinc 50 MG TABS Take 50 mg by mouth daily after supper.    . ondansetron (ZOFRAN) 8 MG tablet Take 1 tablet (8 mg total) by mouth 2 (two) times daily as needed. Start on day 3 after chemotherapy. (Patient not taking: Reported on 07/26/2018) 30 tablet 1  . prochlorperazine (COMPAZINE) 10 MG tablet Take 1 tablet (10 mg total) by mouth every 6 (six) hours as needed (Nausea or vomiting). (Patient not taking: Reported on 07/26/2018) 30 tablet 1  . traMADol (ULTRAM) 50 MG tablet Take 1 tablet (50 mg total) by mouth every 6 (six) hours as needed. 30 tablet 0   No current facility-administered medications for this visit.    Facility-Administered Medications Ordered in Other Visits  Medication Dose Route Frequency Provider Last Rate Last Dose  . fluorouracil (ADRUCIL) 4,650 mg in sodium chloride 0.9 % 57 mL chemo infusion  2,400 mg/m2 (Treatment Plan Recorded) Intravenous 1 day or 1 dose Truitt Merle, MD   4,650 mg at 07/26/18 1817    PHYSICAL EXAMINATION: ECOG PERFORMANCE STATUS: 1 - Symptomatic but completely ambulatory  Vitals:   07/26/18 1203  BP: (!) 144/70  Pulse: 75  Resp: 18  Temp: 98.9 F (37.2 C)  SpO2: 98%   Filed Weights   07/26/18 1203  Weight: 181 lb 9.6 oz (82.4 kg)    GENERAL:alert, no distress and comfortable SKIN: skin color, texture, turgor are normal, no rashes or significant lesions EYES: normal, Conjunctiva are pink and non-injected, sclera clear  NECK: supple, thyroid normal size, non-tender, without nodularity LYMPH:  no palpable lymphadenopathy in the cervical, axillary  LUNGS: clear to auscultation and percussion with normal breathing effort HEART: regular rate & rhythm and no murmurs and no lower extremity edema ABDOMEN:abdomen soft, non-tender and normal bowel sounds Musculoskeletal:no cyanosis of digits and no clubbing  NEURO: alert & oriented x 3 with fluent speech, no focal motor/sensory deficits   LABORATORY DATA:  I have reviewed the data as listed CBC Latest Ref Rng & Units 07/26/2018 07/20/2018 07/02/2018  WBC 4.0 - 10.5 K/uL 9.1 7.4 9.8  Hemoglobin 12.0 - 15.0 g/dL 12.2 13.1 13.8  Hematocrit 36.0 - 46.0 % 37.0 39.7 42.7  Platelets 150 - 400 K/uL 312 388 285     CMP Latest Ref Rng & Units 07/26/2018 07/02/2018 07/01/2018  Glucose 70 - 99 mg/dL 178(H) 160(H) 126(H)  BUN 8 - 23 mg/dL 15 15 15   Creatinine 0.44 - 1.00 mg/dL 0.78 0.73 0.54  Sodium 135 - 145 mmol/L 140 139 138  Potassium 3.5 - 5.1 mmol/L 3.7 4.0 3.8  Chloride 98 - 111 mmol/L 103 103 103  CO2 22 - 32 mmol/L 27 24 22   Calcium 8.9 - 10.3 mg/dL 9.1 9.2 9.0  Total Protein 6.5 - 8.1 g/dL 6.6 6.4(L) 6.5  Total Bilirubin 0.3 - 1.2 mg/dL 0.6 3.0(H) 7.6(H)  Alkaline Phos 38 - 126 U/L 122 344(H) 367(H)  AST 15 - 41 U/L 24 66(H) 193(H)  ALT 0 - 44 U/L 16 339(H) 470(H)      RADIOGRAPHIC STUDIES: I have personally reviewed the radiological images as listed and agreed with the findings in the report. No results found.   ASSESSMENT & PLAN:  Taylor Oconnor is a 68 y.o. female with   1. 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 She was found to have a 2.4cm mass in head of pancreas, initial ERCP with stent placement , EUS and FNA biopsy of her pancreatic mass from 07/14/18 confirmed high grade adenocarcinoma. Her pancreatic mass on the MRI and the EUS was borderline resectable, due to the invasion of portal vein. -I personally reviewed her PET from 07/21/18 with pt and it shows significant increase and hypermetabolic multiple liver lesions and porta hepatic LN which is compatible with liver metastasis. -given the interval growth of her liver lesion from CT one month ago, and typical PET findings, I do not feel biopsy is very necessary, she agrees.  -Given evidence of metastatic liver metastasis, her cancer is stage IV and no longer curable and surgery is no  longer an option. Her cancer is still treatable. I recommend intensive chemo FOLFIRINOX q3weeks. I again reviewed possible side  effects. Plan to start today.  --Chemotherapy consent: Side effects including but does not not limited to, fatigue, nausea, vomiting, diarrhea, hair loss, neuropathy, fluid retention, renal and kidney dysfunction, neutropenic fever, needed for blood transfusion, bleeding, were discussed with patient in great detail. She agrees to proceed. -Goal of therapy is palliative to control her disease and prolong her life.  -PAC placed and she completed chemo education class -Labs reviewed, CBC and CMP WNL except BG 178, albumin 3.2. Ca 19.9 still pending. Adequate for treatment  -f/u next week and in 2 weeks   2. Epigastric Abdominal pain, secondary to #1  -Improved since stent placed on 07/01/18 -Her epigastric pain is intermittent but significant. I will call in Tramadol to use as needed (07/26/18). She can otherwise continue Tylenol for mild pain.   3.  Obstructive jaundice, s/p stent placement on 07/01/18  -LFTs have improved after stent placement. Her jaundice has resolved.  -LFTs and Tbili normal now. Will monitor   4. Low appetite, Weight loss, Nausea  -Years ago she had gastric band placed. She is interested in having it removed.  -She has lost 32 pounds over 5 months secondary to her recent cancer.  -She takes Zofran and Phenergan for her nausea, continue  5. H/o of right breast cancer, RS 16, ER/PR +, HER2- -Managed by Med Onc Dr [name]  -Diagnosed in 11/2014. S/p right lumpectomy and adjuvant radiation.  -She has been on Anastrozole since 05/2015. She is tolerating well. Will continue   6. Genetic testing  -Given her history of cancer and pancreatic cancer, I recommend genetic testing for her and FO for her tumor which can given more treatment options. She is interested.  -Genetic referral sent, and results pending.   7. HTN, DM -DM is mostly controlled,  she is off medication -On Verapamil and atenolol -Her Kidney function tests have been normal lately. -will monitor BP and BG closely on chemo    PLAN:  -I called in Tramadol today  -PET scan reviewed with patient, shows evidence of liver mets.  -Labs reviewed and adequate to proceed with C1 FOLFIRINOX today with Udenyca on day 3  -F/u for toxicity check in 1 week -Lab, flush, f/u and chemo in 2 weeks    No problem-specific Assessment & Plan notes found for this encounter.   No orders of the defined types were placed in this encounter.  All questions were answered. The patient knows to call the clinic with any problems, questions or concerns. No barriers to learning was detected. I spent 25 minutes counseling the patient face to face. The total time spent in the appointment was 30 minutes and more than 50% was on counseling and review of test results     Truitt Merle, MD 07/26/2018   I, Joslyn Devon, am acting as scribe for Truitt Merle, MD.   I have reviewed the above documentation for accuracy and completeness, and I agree with the above.

## 2018-07-26 ENCOUNTER — Encounter: Payer: Self-pay | Admitting: Genetic Counselor

## 2018-07-26 ENCOUNTER — Inpatient Hospital Stay: Payer: Medicare Other

## 2018-07-26 ENCOUNTER — Other Ambulatory Visit: Payer: Self-pay

## 2018-07-26 ENCOUNTER — Inpatient Hospital Stay (HOSPITAL_BASED_OUTPATIENT_CLINIC_OR_DEPARTMENT_OTHER): Payer: Medicare Other | Admitting: Hematology

## 2018-07-26 ENCOUNTER — Encounter: Payer: Self-pay | Admitting: Hematology

## 2018-07-26 ENCOUNTER — Telehealth: Payer: Self-pay | Admitting: Hematology

## 2018-07-26 DIAGNOSIS — Z515 Encounter for palliative care: Secondary | ICD-10-CM | POA: Insufficient documentation

## 2018-07-26 DIAGNOSIS — Z79899 Other long term (current) drug therapy: Secondary | ICD-10-CM | POA: Diagnosis not present

## 2018-07-26 DIAGNOSIS — Z803 Family history of malignant neoplasm of breast: Secondary | ICD-10-CM

## 2018-07-26 DIAGNOSIS — C250 Malignant neoplasm of head of pancreas: Secondary | ICD-10-CM

## 2018-07-26 DIAGNOSIS — K1230 Oral mucositis (ulcerative), unspecified: Secondary | ICD-10-CM | POA: Diagnosis not present

## 2018-07-26 DIAGNOSIS — K219 Gastro-esophageal reflux disease without esophagitis: Secondary | ICD-10-CM | POA: Diagnosis not present

## 2018-07-26 DIAGNOSIS — E282 Polycystic ovarian syndrome: Secondary | ICD-10-CM

## 2018-07-26 DIAGNOSIS — Z7189 Other specified counseling: Secondary | ICD-10-CM

## 2018-07-26 DIAGNOSIS — C787 Secondary malignant neoplasm of liver and intrahepatic bile duct: Secondary | ICD-10-CM | POA: Diagnosis not present

## 2018-07-26 DIAGNOSIS — Z170 Estrogen receptor positive status [ER+]: Secondary | ICD-10-CM | POA: Diagnosis not present

## 2018-07-26 DIAGNOSIS — K449 Diaphragmatic hernia without obstruction or gangrene: Secondary | ICD-10-CM

## 2018-07-26 DIAGNOSIS — G4700 Insomnia, unspecified: Secondary | ICD-10-CM

## 2018-07-26 DIAGNOSIS — I129 Hypertensive chronic kidney disease with stage 1 through stage 4 chronic kidney disease, or unspecified chronic kidney disease: Secondary | ICD-10-CM

## 2018-07-26 DIAGNOSIS — E1122 Type 2 diabetes mellitus with diabetic chronic kidney disease: Secondary | ICD-10-CM | POA: Diagnosis not present

## 2018-07-26 DIAGNOSIS — C50411 Malignant neoplasm of upper-outer quadrant of right female breast: Secondary | ICD-10-CM

## 2018-07-26 DIAGNOSIS — Z5111 Encounter for antineoplastic chemotherapy: Secondary | ICD-10-CM | POA: Diagnosis not present

## 2018-07-26 DIAGNOSIS — Z79811 Long term (current) use of aromatase inhibitors: Secondary | ICD-10-CM

## 2018-07-26 DIAGNOSIS — Z9884 Bariatric surgery status: Secondary | ICD-10-CM | POA: Diagnosis not present

## 2018-07-26 DIAGNOSIS — K831 Obstruction of bile duct: Secondary | ICD-10-CM

## 2018-07-26 DIAGNOSIS — N182 Chronic kidney disease, stage 2 (mild): Secondary | ICD-10-CM

## 2018-07-26 DIAGNOSIS — K8689 Other specified diseases of pancreas: Secondary | ICD-10-CM | POA: Diagnosis not present

## 2018-07-26 DIAGNOSIS — M546 Pain in thoracic spine: Secondary | ICD-10-CM | POA: Diagnosis not present

## 2018-07-26 DIAGNOSIS — E785 Hyperlipidemia, unspecified: Secondary | ICD-10-CM | POA: Diagnosis not present

## 2018-07-26 DIAGNOSIS — Z853 Personal history of malignant neoplasm of breast: Secondary | ICD-10-CM | POA: Diagnosis not present

## 2018-07-26 DIAGNOSIS — F419 Anxiety disorder, unspecified: Secondary | ICD-10-CM

## 2018-07-26 DIAGNOSIS — Z95828 Presence of other vascular implants and grafts: Secondary | ICD-10-CM

## 2018-07-26 LAB — CBC WITH DIFFERENTIAL (CANCER CENTER ONLY)
Abs Immature Granulocytes: 0.02 10*3/uL (ref 0.00–0.07)
Basophils Absolute: 0 10*3/uL (ref 0.0–0.1)
Basophils Relative: 0 %
Eosinophils Absolute: 0 10*3/uL (ref 0.0–0.5)
Eosinophils Relative: 0 %
HCT: 37 % (ref 36.0–46.0)
Hemoglobin: 12.2 g/dL (ref 12.0–15.0)
Immature Granulocytes: 0 %
Lymphocytes Relative: 14 %
Lymphs Abs: 1.3 10*3/uL (ref 0.7–4.0)
MCH: 30.7 pg (ref 26.0–34.0)
MCHC: 33 g/dL (ref 30.0–36.0)
MCV: 93 fL (ref 80.0–100.0)
Monocytes Absolute: 0.7 10*3/uL (ref 0.1–1.0)
Monocytes Relative: 8 %
Neutro Abs: 7 10*3/uL (ref 1.7–7.7)
Neutrophils Relative %: 78 %
Platelet Count: 312 10*3/uL (ref 150–400)
RBC: 3.98 MIL/uL (ref 3.87–5.11)
RDW: 12.8 % (ref 11.5–15.5)
WBC Count: 9.1 10*3/uL (ref 4.0–10.5)
nRBC: 0 % (ref 0.0–0.2)

## 2018-07-26 LAB — CMP (CANCER CENTER ONLY)
ALT: 16 U/L (ref 0–44)
AST: 24 U/L (ref 15–41)
Albumin: 3.2 g/dL — ABNORMAL LOW (ref 3.5–5.0)
Alkaline Phosphatase: 122 U/L (ref 38–126)
Anion gap: 10 (ref 5–15)
BUN: 15 mg/dL (ref 8–23)
CO2: 27 mmol/L (ref 22–32)
Calcium: 9.1 mg/dL (ref 8.9–10.3)
Chloride: 103 mmol/L (ref 98–111)
Creatinine: 0.78 mg/dL (ref 0.44–1.00)
GFR, Est AFR Am: >60 mL/min (ref >60)
GFR, Estimated: >60 mL/min (ref >60)
Glucose, Bld: 178 mg/dL — ABNORMAL HIGH (ref 70–99)
Potassium: 3.7 mmol/L (ref 3.5–5.1)
Sodium: 140 mmol/L (ref 135–145)
Total Bilirubin: 0.6 mg/dL (ref 0.3–1.2)
Total Protein: 6.6 g/dL (ref 6.5–8.1)

## 2018-07-26 MED ORDER — FLUOROURACIL CHEMO INJECTION 2.5 GM/50ML
400.0000 mg/m2 | Freq: Once | INTRAVENOUS | Status: AC
  Administered 2018-07-26: 750 mg via INTRAVENOUS
  Filled 2018-07-26: qty 15

## 2018-07-26 MED ORDER — ATROPINE SULFATE 1 MG/ML IJ SOLN
0.5000 mg | Freq: Once | INTRAMUSCULAR | Status: AC | PRN
  Administered 2018-07-26: 0.5 mg via INTRAVENOUS

## 2018-07-26 MED ORDER — TRAMADOL HCL 50 MG PO TABS: 50.0000 mg | ORAL_TABLET | Freq: Four times a day (QID) | ORAL | 0 refills | Status: DC | PRN

## 2018-07-26 MED ORDER — OXALIPLATIN CHEMO INJECTION 100 MG/20ML
85.0000 mg/m2 | Freq: Once | INTRAVENOUS | Status: AC
  Administered 2018-07-26: 165 mg via INTRAVENOUS
  Filled 2018-07-26: qty 33

## 2018-07-26 MED ORDER — PALONOSETRON HCL INJECTION 0.25 MG/5ML
0.2500 mg | Freq: Once | INTRAVENOUS | Status: AC
  Administered 2018-07-26: 0.25 mg via INTRAVENOUS

## 2018-07-26 MED ORDER — ATROPINE SULFATE 1 MG/ML IJ SOLN
INTRAMUSCULAR | Status: AC
  Filled 2018-07-26: qty 1

## 2018-07-26 MED ORDER — LEUCOVORIN CALCIUM INJECTION 350 MG
400.0000 mg/m2 | Freq: Once | INTRAVENOUS | Status: AC
  Administered 2018-07-26: 772 mg via INTRAVENOUS
  Filled 2018-07-26: qty 38.6

## 2018-07-26 MED ORDER — SODIUM CHLORIDE 0.9 % IV SOLN
2400.0000 mg/m2 | INTRAVENOUS | Status: DC
  Administered 2018-07-26: 4650 mg via INTRAVENOUS
  Filled 2018-07-26: qty 93

## 2018-07-26 MED ORDER — PALONOSETRON HCL INJECTION 0.25 MG/5ML
INTRAVENOUS | Status: AC
  Filled 2018-07-26: qty 5

## 2018-07-26 MED ORDER — SODIUM CHLORIDE 0.9% FLUSH
10.0000 mL | INTRAVENOUS | Status: DC | PRN
  Administered 2018-07-26: 10 mL via INTRAVENOUS
  Filled 2018-07-26: qty 10

## 2018-07-26 MED ORDER — SODIUM CHLORIDE 0.9 % IV SOLN
Freq: Once | INTRAVENOUS | Status: AC
  Administered 2018-07-26: 13:00:00 via INTRAVENOUS
  Filled 2018-07-26: qty 5

## 2018-07-26 MED ORDER — IRINOTECAN HCL CHEMO INJECTION 100 MG/5ML
150.0000 mg/m2 | Freq: Once | INTRAVENOUS | Status: AC
  Administered 2018-07-26: 280 mg via INTRAVENOUS
  Filled 2018-07-26: qty 14

## 2018-07-26 MED ORDER — DEXTROSE 5 % IV SOLN
Freq: Once | INTRAVENOUS | Status: AC
  Administered 2018-07-26: 13:00:00 via INTRAVENOUS
  Filled 2018-07-26: qty 250

## 2018-07-26 NOTE — Patient Instructions (Signed)
[facility] Discharge Instructions for Patients Receiving Chemotherapy  Today you received the following chemotherapy agents:  Oxaliplatin, Irinotecan, Leucovorin, and 5FU.  To help prevent nausea and vomiting after your treatment, we encourage you to take your nausea medication as directed.   If you develop nausea and vomiting that is not controlled by your nausea medication, call the clinic.   BELOW ARE SYMPTOMS THAT SHOULD BE REPORTED IMMEDIATELY:  *FEVER GREATER THAN 100.5 F  *CHILLS WITH OR WITHOUT FEVER  NAUSEA AND VOMITING THAT IS NOT CONTROLLED WITH YOUR NAUSEA MEDICATION  *UNUSUAL SHORTNESS OF BREATH  *UNUSUAL BRUISING OR BLEEDING  TENDERNESS IN MOUTH AND THROAT WITH OR WITHOUT PRESENCE OF ULCERS  *URINARY PROBLEMS  *BOWEL PROBLEMS  UNUSUAL RASH Items with * indicate a potential emergency and should be followed up as soon as possible.  Feel free to call the clinic should you have any questions or concerns. The clinic phone number is [phone].  Please show the [facility] at check-in to the Emergency Department and triage nurse.  Oxaliplatin Injection What is this medicine? OXALIPLATIN (ox AL i PLA tin) is a chemotherapy drug. It targets fast dividing cells, like cancer cells, and causes these cells to die. This medicine is used to treat cancers of the colon and rectum, and many other cancers. This medicine may be used for other purposes; ask your health care provider or pharmacist if you have questions. COMMON BRAND NAME(S): Eloxatin What should I tell my health care provider before I take this medicine? They need to know if you have any of these conditions:  kidney disease  an unusual or allergic reaction to oxaliplatin, other chemotherapy, other medicines, foods, dyes, or preservatives  pregnant or trying to get pregnant  breast-feeding How should I use this medicine? This drug is given as an infusion into a vein. It is  administered in a hospital or clinic by a specially trained health care professional. Talk to your pediatrician regarding the use of this medicine in children. Special care may be needed. Overdosage: If you think you have taken too much of this medicine contact a poison control center or emergency room at once. NOTE: This medicine is only for you. Do not share this medicine with others. What if I miss a dose? It is important not to miss a dose. Call your doctor or health care professional if you are unable to keep an appointment. What may interact with this medicine?  medicines to increase blood counts like filgrastim, pegfilgrastim, sargramostim  probenecid  some antibiotics like amikacin, gentamicin, neomycin, polymyxin B, streptomycin, tobramycin  zalcitabine Talk to your doctor or health care professional before taking any of these medicines:  acetaminophen  aspirin  ibuprofen  ketoprofen  naproxen This list may not describe all possible interactions. Give your health care provider a list of all the medicines, herbs, non-prescription drugs, or dietary supplements you use. Also tell them if you smoke, drink alcohol, or use illegal drugs. Some items may interact with your medicine. What should I watch for while using this medicine? Your condition will be monitored carefully while you are receiving this medicine. You will need important blood work done while you are taking this medicine. This medicine can make you more sensitive to cold. Do not drink cold drinks or use ice. Cover exposed skin before coming in contact with cold temperatures or cold objects. When out in cold weather wear warm clothing and cover your mouth and nose to warm the air  that goes into your lungs. Tell your doctor if you get sensitive to the cold. This drug may make you feel generally unwell. This is not uncommon, as chemotherapy can affect healthy cells as well as cancer cells. Report any side effects. Continue  your course of treatment even though you feel ill unless your doctor tells you to stop. In some cases, you may be given additional medicines to help with side effects. Follow all directions for their use. Call your doctor or health care professional for advice if you get a fever, chills or sore throat, or other symptoms of a cold or flu. Do not treat yourself. This drug decreases your body's ability to fight infections. Try to avoid being around people who are sick. This medicine may increase your risk to bruise or bleed. Call your doctor or health care professional if you notice any unusual bleeding. Be careful brushing and flossing your teeth or using a toothpick because you may get an infection or bleed more easily. If you have any dental work done, tell your dentist you are receiving this medicine. Avoid taking products that contain aspirin, acetaminophen, ibuprofen, naproxen, or ketoprofen unless instructed by your doctor. These medicines may hide a fever. Do not become pregnant while taking this medicine. Women should inform their doctor if they wish to become pregnant or think they might be pregnant. There is a potential for serious side effects to an unborn child. Talk to your health care professional or pharmacist for more information. Do not breast-feed an infant while taking this medicine. Call your doctor or health care professional if you get diarrhea. Do not treat yourself. What side effects may I notice from receiving this medicine? Side effects that you should report to your doctor or health care professional as soon as possible:  allergic reactions like skin rash, itching or hives, swelling of the face, lips, or tongue  low blood counts - This drug may decrease the number of white blood cells, red blood cells and platelets. You may be at increased risk for infections and bleeding.  signs of infection - fever or chills, cough, sore throat, pain or difficulty passing urine  signs of  decreased platelets or bleeding - bruising, pinpoint red spots on the skin, black, tarry stools, nosebleeds  signs of decreased red blood cells - unusually weak or tired, fainting spells, lightheadedness  breathing problems  chest pain, pressure  cough  diarrhea  jaw tightness  mouth sores  nausea and vomiting  pain, swelling, redness or irritation at the injection site  pain, tingling, numbness in the hands or feet  problems with balance, talking, walking  redness, blistering, peeling or loosening of the skin, including inside the mouth  trouble passing urine or change in the amount of urine Side effects that usually do not require medical attention (report to your doctor or health care professional if they continue or are bothersome):  changes in vision  constipation  hair loss  loss of appetite  metallic taste in the mouth or changes in taste  stomach pain This list may not describe all possible side effects. Call your doctor for medical advice about side effects. You may report side effects to FDA at 1-800-FDA-1088. Where should I keep my medicine? This drug is given in a hospital or clinic and will not be stored at home. NOTE: This sheet is a summary. It may not cover all possible information. If you have questions about this medicine, talk to your doctor, pharmacist, or health  care provider.  2020 Elsevier/Gold Standard (2007-07-26 17:22:47)  Irinotecan injection What is this medicine? IRINOTECAN (ir in oh TEE kan ) is a chemotherapy drug. It is used to treat colon and rectal cancer. This medicine may be used for other purposes; ask your health care provider or pharmacist if you have questions. COMMON BRAND NAME(S): Camptosar What should I tell my health care provider before I take this medicine? They need to know if you have any of these conditions:  dehydration  diarrhea  infection (especially a virus infection such as chickenpox, cold sores, or  herpes)  liver disease  low blood counts, like low white cell, platelet, or red cell counts  low levels of calcium, magnesium, or potassium in the blood  recent or ongoing radiation therapy  an unusual or allergic reaction to irinotecan, other medicines, foods, dyes, or preservatives  pregnant or trying to get pregnant  breast-feeding How should I use this medicine? This drug is given as an infusion into a vein. It is administered in a hospital or clinic by a specially trained health care professional. Talk to your pediatrician regarding the use of this medicine in children. Special care may be needed. Overdosage: If you think you have taken too much of this medicine contact a poison control center or emergency room at once. NOTE: This medicine is only for you. Do not share this medicine with others. What if I miss a dose? It is important not to miss your dose. Call your doctor or health care professional if you are unable to keep an appointment. What may interact with this medicine? This medicine may interact with the following medications:  antiviral medicines for HIV or AIDS  certain antibiotics like rifampin or rifabutin  certain medicines for fungal infections like itraconazole, ketoconazole, posaconazole, and voriconazole  certain medicines for seizures like carbamazepine, phenobarbital, phenotoin  clarithromycin  gemfibrozil  nefazodone  St. John's Wort This list may not describe all possible interactions. Give your health care provider a list of all the medicines, herbs, non-prescription drugs, or dietary supplements you use. Also tell them if you smoke, drink alcohol, or use illegal drugs. Some items may interact with your medicine. What should I watch for while using this medicine? Your condition will be monitored carefully while you are receiving this medicine. You will need important blood work done while you are taking this medicine. This drug may make you feel  generally unwell. This is not uncommon, as chemotherapy can affect healthy cells as well as cancer cells. Report any side effects. Continue your course of treatment even though you feel ill unless your doctor tells you to stop. In some cases, you may be given additional medicines to help with side effects. Follow all directions for their use. You may get drowsy or dizzy. Do not drive, use machinery, or do anything that needs mental alertness until you know how this medicine affects you. Do not stand or sit up quickly, especially if you are an older patient. This reduces the risk of dizzy or fainting spells. Call your health care professional for advice if you get a fever, chills, or sore throat, or other symptoms of a cold or flu. Do not treat yourself. This medicine decreases your body's ability to fight infections. Try to avoid being around people who are sick. Avoid taking products that contain aspirin, acetaminophen, ibuprofen, naproxen, or ketoprofen unless instructed by your doctor. These medicines may hide a fever. This medicine may increase your risk to bruise or bleed.  Call your doctor or health care professional if you notice any unusual bleeding. Be careful brushing and flossing your teeth or using a toothpick because you may get an infection or bleed more easily. If you have any dental work done, tell your dentist you are receiving this medicine. Do not become pregnant while taking this medicine or for 6 months after stopping it. Women should inform their health care professional if they wish to become pregnant or think they might be pregnant. Men should not father a child while taking this medicine and for 3 months after stopping it. There is potential for serious side effects to an unborn child. Talk to your health care professional for more information. Do not breast-feed an infant while taking this medicine or for 7 days after stopping it. This medicine has caused ovarian failure in some  women. This medicine may make it more difficult to get pregnant. Talk to your health care professional if you are concerned about your fertility. This medicine has caused decreased sperm counts in some men. This may make it more difficult to father a child. Talk to your health care professional if you are concerned about your fertility. What side effects may I notice from receiving this medicine? Side effects that you should report to your doctor or health care professional as soon as possible:  allergic reactions like skin rash, itching or hives, swelling of the face, lips, or tongue  chest pain  diarrhea  flushing, runny nose, sweating during infusion  low blood counts - this medicine may decrease the number of white blood cells, red blood cells and platelets. You may be at increased risk for infections and bleeding.  nausea, vomiting  pain, swelling, warmth in the leg  signs of decreased platelets or bleeding - bruising, pinpoint red spots on the skin, black, tarry stools, blood in the urine  signs of infection - fever or chills, cough, sore throat, pain or difficulty passing urine  signs of decreased red blood cells - unusually weak or tired, fainting spells, lightheadedness Side effects that usually do not require medical attention (report to your doctor or health care professional if they continue or are bothersome):  constipation  hair loss  headache  loss of appetite  mouth sores  stomach pain This list may not describe all possible side effects. Call your doctor for medical advice about side effects. You may report side effects to FDA at 1-800-FDA-1088. Where should I keep my medicine? This drug is given in a hospital or clinic and will not be stored at home. NOTE: This sheet is a summary. It may not cover all possible information. If you have questions about this medicine, talk to your doctor, pharmacist, or health care provider.  2020 Elsevier/Gold Standard  (2018-02-18 10:09:17)  Leucovorin injection What is this medicine? LEUCOVORIN (loo koe VOR in) is used to prevent or treat the harmful effects of some medicines. This medicine is used to treat anemia caused by a low amount of folic acid in the body. It is also used with 5-fluorouracil (5-FU) to treat colon cancer. This medicine may be used for other purposes; ask your health care provider or pharmacist if you have questions. What should I tell my health care provider before I take this medicine? They need to know if you have any of these conditions:  anemia from low levels of vitamin B-12 in the blood  an unusual or allergic reaction to leucovorin, folic acid, other medicines, foods, dyes, or preservatives  pregnant  or trying to get pregnant  breast-feeding How should I use this medicine? This medicine is for injection into a muscle or into a vein. It is given by a health care professional in a hospital or clinic setting. Talk to your pediatrician regarding the use of this medicine in children. Special care may be needed. Overdosage: If you think you have taken too much of this medicine contact a poison control center or emergency room at once. NOTE: This medicine is only for you. Do not share this medicine with others. What if I miss a dose? This does not apply. What may interact with this medicine?  capecitabine  fluorouracil  phenobarbital  phenytoin  primidone  trimethoprim-sulfamethoxazole This list may not describe all possible interactions. Give your health care provider a list of all the medicines, herbs, non-prescription drugs, or dietary supplements you use. Also tell them if you smoke, drink alcohol, or use illegal drugs. Some items may interact with your medicine. What should I watch for while using this medicine? Your condition will be monitored carefully while you are receiving this medicine. This medicine may increase the side effects of 5-fluorouracil, 5-FU.  Tell your doctor or health care professional if you have diarrhea or mouth sores that do not get better or that get worse. What side effects may I notice from receiving this medicine? Side effects that you should report to your doctor or health care professional as soon as possible:  allergic reactions like skin rash, itching or hives, swelling of the face, lips, or tongue  breathing problems  fever, infection  mouth sores  unusual bleeding or bruising  unusually weak or tired Side effects that usually do not require medical attention (report to your doctor or health care professional if they continue or are bothersome):  constipation or diarrhea  loss of appetite  nausea, vomiting This list may not describe all possible side effects. Call your doctor for medical advice about side effects. You may report side effects to FDA at 1-800-FDA-1088. Where should I keep my medicine? This drug is given in a hospital or clinic and will not be stored at home. NOTE: This sheet is a summary. It may not cover all possible information. If you have questions about this medicine, talk to your doctor, pharmacist, or health care provider.  2020 Elsevier/Gold Standard (2007-07-05 16:50:29)  Fluorouracil, 5-FU injection What is this medicine? FLUOROURACIL, 5-FU (flure oh YOOR a sil) is a chemotherapy drug. It slows the growth of cancer cells. This medicine is used to treat many types of cancer like breast cancer, colon or rectal cancer, pancreatic cancer, and stomach cancer. This medicine may be used for other purposes; ask your health care provider or pharmacist if you have questions. COMMON BRAND NAME(S): Adrucil What should I tell my health care provider before I take this medicine? They need to know if you have any of these conditions:  blood disorders  dihydropyrimidine dehydrogenase (DPD) deficiency  infection (especially a virus infection such as chickenpox, cold sores, or  herpes)  kidney disease  liver disease  malnourished, poor nutrition  recent or ongoing radiation therapy  an unusual or allergic reaction to fluorouracil, other chemotherapy, other medicines, foods, dyes, or preservatives  pregnant or trying to get pregnant  breast-feeding How should I use this medicine? This drug is given as an infusion or injection into a vein. It is administered in a hospital or clinic by a specially trained health care professional. Talk to your pediatrician regarding the use of  this medicine in children. Special care may be needed. Overdosage: If you think you have taken too much of this medicine contact a poison control center or emergency room at once. NOTE: This medicine is only for you. Do not share this medicine with others. What if I miss a dose? It is important not to miss your dose. Call your doctor or health care professional if you are unable to keep an appointment. What may interact with this medicine?  allopurinol  cimetidine  dapsone  digoxin  hydroxyurea  leucovorin  levamisole  medicines for seizures like ethotoin, fosphenytoin, phenytoin  medicines to increase blood counts like filgrastim, pegfilgrastim, sargramostim  medicines that treat or prevent blood clots like warfarin, enoxaparin, and dalteparin  methotrexate  metronidazole  pyrimethamine  some other chemotherapy drugs like busulfan, cisplatin, estramustine, vinblastine  trimethoprim  trimetrexate  vaccines Talk to your doctor or health care professional before taking any of these medicines:  acetaminophen  aspirin  ibuprofen  ketoprofen  naproxen This list may not describe all possible interactions. Give your health care provider a list of all the medicines, herbs, non-prescription drugs, or dietary supplements you use. Also tell them if you smoke, drink alcohol, or use illegal drugs. Some items may interact with your medicine. What should I watch for  while using this medicine? Visit your doctor for checks on your progress. This drug may make you feel generally unwell. This is not uncommon, as chemotherapy can affect healthy cells as well as cancer cells. Report any side effects. Continue your course of treatment even though you feel ill unless your doctor tells you to stop. In some cases, you may be given additional medicines to help with side effects. Follow all directions for their use. Call your doctor or health care professional for advice if you get a fever, chills or sore throat, or other symptoms of a cold or flu. Do not treat yourself. This drug decreases your body's ability to fight infections. Try to avoid being around people who are sick. This medicine may increase your risk to bruise or bleed. Call your doctor or health care professional if you notice any unusual bleeding. Be careful brushing and flossing your teeth or using a toothpick because you may get an infection or bleed more easily. If you have any dental work done, tell your dentist you are receiving this medicine. Avoid taking products that contain aspirin, acetaminophen, ibuprofen, naproxen, or ketoprofen unless instructed by your doctor. These medicines may hide a fever. Do not become pregnant while taking this medicine. Women should inform their doctor if they wish to become pregnant or think they might be pregnant. There is a potential for serious side effects to an unborn child. Talk to your health care professional or pharmacist for more information. Do not breast-feed an infant while taking this medicine. Men should inform their doctor if they wish to father a child. This medicine may lower sperm counts. Do not treat diarrhea with over the counter products. Contact your doctor if you have diarrhea that lasts more than 2 days or if it is severe and watery. This medicine can make you more sensitive to the sun. Keep out of the sun. If you cannot avoid being in the sun, wear  protective clothing and use sunscreen. Do not use sun lamps or tanning beds/booths. What side effects may I notice from receiving this medicine? Side effects that you should report to your doctor or health care professional as soon as possible:  allergic reactions  like skin rash, itching or hives, swelling of the face, lips, or tongue  low blood counts - this medicine may decrease the number of white blood cells, red blood cells and platelets. You may be at increased risk for infections and bleeding.  signs of infection - fever or chills, cough, sore throat, pain or difficulty passing urine  signs of decreased platelets or bleeding - bruising, pinpoint red spots on the skin, black, tarry stools, blood in the urine  signs of decreased red blood cells - unusually weak or tired, fainting spells, lightheadedness  breathing problems  changes in vision  chest pain  mouth sores  nausea and vomiting  pain, swelling, redness at site where injected  pain, tingling, numbness in the hands or feet  redness, swelling, or sores on hands or feet  stomach pain  unusual bleeding Side effects that usually do not require medical attention (report to your doctor or health care professional if they continue or are bothersome):  changes in finger or toe nails  diarrhea  dry or itchy skin  hair loss  headache  loss of appetite  sensitivity of eyes to the light  stomach upset  unusually teary eyes This list may not describe all possible side effects. Call your doctor for medical advice about side effects. You may report side effects to FDA at 1-800-FDA-1088. Where should I keep my medicine? This drug is given in a hospital or clinic and will not be stored at home. NOTE: This sheet is a summary. It may not cover all possible information. If you have questions about this medicine, talk to your doctor, pharmacist, or health care provider.  2020 Elsevier/Gold Standard (2007-05-04  13:53:16)

## 2018-07-26 NOTE — Telephone Encounter (Signed)
Scheduled appt per 7/24 los.

## 2018-07-26 NOTE — Progress Notes (Signed)
At the end of infusion, patient began c/o nausea and rhinorrhea. Atropine given. Patient verbalized rapid improvement and returned to baseline after atropine administration.

## 2018-07-26 NOTE — Progress Notes (Signed)
REFERRING PROVIDER: Truitt Merle, MD [address] [place],  [place] [address]  PRIMARY PROVIDER:  Unk Pinto, MD  PRIMARY REASON FOR VISIT:  1. Malignant neoplasm of upper-outer quadrant of right breast in female, estrogen receptor positive ([place])   2. Family history of breast cancer      HISTORY OF PRESENT ILLNESS:   Taylor Oconnor, a 68 y.o. female, was seen for a [facility] cancer genetics consultation at the request of Dr. Burr Medico due to a personal and family history of cancer.  Taylor Oconnor presents to clinic today to discuss the possibility of a hereditary predisposition to cancer, genetic testing, and to further clarify her future cancer risks, as well as potential cancer risks for family members.   In 2016, at the age of 4, Taylor Oconnor was diagnosed with cancer of the breast. The treatment plan included lumpectomy, radiation and antiestrogen therapy.  In June 2020, at the age of 26, Taylor Oconnor was diagnosed with pancreatic cancer.        CANCER HISTORY:  Oncology History  Malignant neoplasm of upper-outer quadrant of right breast in female, estrogen receptor positive ([place])  11/15/2014 Mammogram   Right breast: mass warranting further evaluation   11/27/2014 Breast US   0.4 x 0.4 x 0.6 cm irregular hypoechoic mass within the right breast 10 o'clock position 7 cm from the nipple. Additionally there is a 0.7 x 0.8 x 0.8 cm taller than wide irregular hypoechoic mass with posterior acoustic shadowing within the right breast.   11/28/2014 Initial Biopsy   Right breast core needle biopsies, 2 masses: both invasive ductal carcinoma, grade 1, ER+ (100%), PR+ (85% -larger), HER2/neu negative, Ki67 5-10%.  Smaller mass PR-   11/28/2014 Clinical Stage   Stage IA: T1b N0   12/28/2014 Definitive Surgery   Right lumpectomies/SLNB: invasive ductal carcinoma, grade 2, repeat HER2/neu negative, 0/2 LN   12/28/2014 Pathologic Stage   Stage IA: npT1c pN0   12/28/2014 Oncotype testing   RS  16 (10% ROR)   02/18/2015 - 04/03/2015 Radiation Therapy    Right breast / 45 Gray at 1.8 [place] Cables per fraction x 25 fractions; right breast boost / 16 Gray at [employer] per fraction x 8 fractions   05/13/2015 -  Anti-estrogen oral therapy   Anastrozole 1 mg daily.   05/30/2015 Survivorship   SCP mailed to patient in lieu of in person visit   Pancreatic cancer ([place])  06/30/2018 Imaging   CT AP WO Contrast 06/30/18  IMPRESSION: 1. New common bile duct dilatation and intrahepatic bile duct dilatation. New fullness of the pancreatic head which could represent rapidly developing obstructive neoplastic process or sequela of recent localized pancreatitis. Recommend further characterization with MRCP or ERCP. 2. No other acute or significant findings. No bowel obstruction or evidence of bowel wall inflammation. No free fluid or abscess collection. No free intraperitoneal air. 3. Additional chronic/incidental findings detailed above.   07/01/2018 Procedure   ERCP by Dr Fuller Plan on 07/01/18  IMPRESSION - A single severe segmental biliary stricture was found in the lower third of the common bile duct. The stricture was malignant appearing. Brushed. - The entire biliary tree proximal to the stricture was dilated, secondary to the stricture. - Prior cholecystectomy. - A biliary sphincterotomy was performed. - One plastic stent was placed into the common bile duct.   07/01/2018 Pathology Results   Diagnosis 07/01/18 BILE DUCT BRUSHING (SPECIMEN 1 OF 1 COLLECTED 07/01/2018) SUSPICIOUS FOR ADENOCARCINOMA. SEE COMMENT.   07/14/2018 Initial Diagnosis  Pancreatic cancer ([facility])   07/14/2018 Procedure   EUS by Dr Ardis Hughs on 07/14/18 IMPRESSION PENDING    07/14/2018 Cancer Staging   Staging form: Exocrine Pancreas, AJCC 8th Edition - Clinical: Stage IB (cT2, cN0, cM0) - Signed by Truitt Merle, MD on 07/15/2018   07/21/2018 PET scan   IMPRESSION: 1. Mass in head of pancreas is intensely hypermetabolic compatible with  primary pancreatic adenocarcinoma. 2. Enlarged and FDG avid porta hepatic lymph node compatible with metastatic adenopathy. 3. Extensive FDG avid liver metastases involve both lobes of liver and exhibit marked progression when compared with MRI from 07/01/2018. 4.  Aortic Atherosclerosis (ICD10-I70.0).    Chemotherapy   FOLFIRINOX q2weeks starting 07/26/18      RISK FACTORS:  Menarche was at age 61.  First live birth at age 37.  Ovaries intact: yes.  Hysterectomy: no.  Menopausal status: postmenopausal.  HRT use: 0 years. Colonoscopy: yes; normal. Mammogram within the last year: yes. Number of breast biopsies: 1. Up to date with pelvic exams: yes. Any excessive radiation exposure in the past: no  Past Medical History:  Diagnosis Date  . Allergy    SEASONAL  . Anxiety   . At risk for difficult insertion of breathing tube   . Breast cancer of upper-outer quadrant of right female breast ([place]) 12/03/2014  . 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 Family history of breast cancer   . GERD (gastroesophageal reflux disease)    hx - resolved with lap band surgery  . H/O hiatal hernia    resolved with band surgery  . H/O laparoscopic adjustable gastric banding 2010  . History of kidney stones 02/2018  . Hyperlipidemia   . Hypertension   . Hypokalemia   . Insomnia    tx with zanax  . Obesity   . Pancreatic mass   . PCOS (polycystic ovarian syndrome)   . Personal history of radiation therapy   . 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 Type II or unspecified type diabetes mellitus without mention of complication, not stated as uncontrolled 12/14/2012   no meds, diet controlled  . Wears contact lenses   . Wears glasses     Past Surgical History:  Procedure Laterality Date  . BACK SURGERY     cervical  . BILIARY STENT PLACEMENT N/A 07/01/2018   Procedure: BILIARY STENT PLACEMENT;  Surgeon: Ladene Artist, MD;  Location: [facility];  Service:  Endoscopy;  Laterality: N/A;  cbd brushing  . BREAST BIOPSY    . BREAST LUMPECTOMY Right 2016  . BREAST LUMPECTOMY WITH NEEDLE LOCALIZATION AND AXILLARY SENTINEL LYMPH NODE BX Right 12/28/2014   Procedure: RIGHT BREAST LUMPECTOMY WITH TWO (2) NEEDLE LOCALIZATION AND AXILLARY SENTINEL LYMPH NODE BX;  Surgeon: Alphonsa Overall, MD;  Location: [facility];  Service: General;  Laterality: Right;  . CESAREAN SECTION     x 1  . CHOLECYSTECTOMY    . COLONOSCOPY  09/29/2004   normal  . CYSTOSCOPY WITH RETROGRADE PYELOGRAM, URETEROSCOPY AND STENT PLACEMENT Right 03/24/2018   Procedure: CYSTOSCOPY WITH RETROGRADE PYELOGRAM, URETEROSCOPY AND STENT PLACEMENT;  Surgeon: Franchot Gallo, MD;  Location[facility];  Service: Urology;  Laterality: Right;  1 HR  . DIAGNOSTIC LAPAROSCOPY    . ERCP N/A 07/01/2018   Procedure: ENDOSCOPIC RETROGRADE CHOLANGIOPANCREATOGRAPHY (ERCP);  Surgeon: Ladene Artist, MD;  Location: [facility];  Service: Endoscopy;  Laterality: N/A;  . ESOPHAGOGASTRODUODENOSCOPY (EGD) WITH PROPOFOL N/A 07/14/2018   Procedure: ESOPHAGOGASTRODUODENOSCOPY (  EGD) WITH PROPOFOL;  Surgeon: Milus Banister, MD;  Location: [facility];  Service: Endoscopy;  Laterality: N/A;  . EUS N/A 07/14/2018   Procedure: UPPER ENDOSCOPIC ULTRASOUND (EUS) RADIAL;  Surgeon: Milus Banister, MD;  Location: [facility];  Service: Endoscopy;  Laterality: N/A;  . FINE NEEDLE ASPIRATION  07/14/2018   Procedure: FINE NEEDLE ASPIRATION (FNA) RADIAL;  Surgeon: Milus Banister, MD;  Location: [facility];  Service: Endoscopy;;  . HERNIA REPAIR  2008  . IR IMAGING GUIDED PORT INSERTION  07/20/2018  . LAPAROSCOPIC APPENDECTOMY N/A 08/06/2012   Procedure: APPENDECTOMY LAPAROSCOPIC;  Surgeon: Earnstine Regal, MD;  Location: [facility];  Service: General;  Laterality: N/A;  . LAPAROSCOPIC GASTRIC BANDING WITH HIATAL HERNIA REPAIR N/A Nov 2008  . SPHINCTEROTOMY  07/01/2018   Procedure: SPHINCTEROTOMY;   Surgeon: Ladene Artist, MD;  Location: [facility];  Service: Endoscopy;;  . TUBAL LIGATION      Social History   Socioeconomic History  . Marital status: Married    Spouse name: Not on file  . Number of children: 1  . Years of education: Not on file  . Highest education level: Not on file  Occupational History  . Not on file  Social Needs  . Financial resource strain: Not on file  . Food insecurity    Worry: Not on file    Inability: Not on file  . Transportation needs    Medical: Not on file    Non-medical: Not on file  Tobacco Use  . Smoking status: Never Smoker  . Smokeless tobacco: Never Used  Substance and Sexual Activity  . Alcohol use: No    Alcohol/week: 0.0 standard drinks  . Drug use: No  . Sexual activity: Not Currently    Birth control/protection: Post-menopausal  Lifestyle  . Physical activity    Days per week: Not on file    Minutes per session: Not on file  . Stress: Not on file  Relationships  . Social Herbalist on phone: Not on file    Gets together: Not on file    Attends religious service: Not on file    Active member of club or organization: Not on file    Attends meetings of clubs or organizations: Not on file    Relationship status: Not on file  Other Topics Concern  . Not on file  Social History Narrative  . Not on file     FAMILY HISTORY:  We obtained a detailed, 4-generation family history.  Significant diagnoses are listed below: Family History  Problem Relation Age of Onset  . Cirrhosis Mother   . Alcohol abuse Mother   . Liver disease Mother   . Diabetes Father   . Hypertension Father   . Stroke Father   . Heart disease Father   . AAA (abdominal aortic aneurysm) Sister 82       twin sister  . Cancer Cousin        Breast  . Breast cancer Cousin        x3  . Colon cancer Neg Hx   . Rectal cancer Neg Hx   . Stomach cancer Neg Hx     The patient has one daughter who is cancer free.  She has a twin sister  who died from an AAA, and an older sister who is living.  Neither had cancer.  Both parents are deceased.    The patient's mother died from complications of alcohol abuse.  She had five brothers and a sister.  The sister is living and has a daughter who had breast cancer.  The maternal grandparents are deceased.  The patient's father died of a stroke at 34.  He had a brother and sister who are cancer free.  The paternal grandparents are deceased. Ms. Steinert is unaware of previous family history of genetic testing for hereditary cancer risks. Patient's maternal ancestors are of [place] descent, and paternal ancestors are of [place] descent. There is no reported Ashkenazi Jewish ancestry. There is no known consanguinity.  GENETIC COUNSELING ASSESSMENT: Ms. Huntoon is a 68 y.o. female with a personal and family history of cancer which is somewhat suggestive of a hereditary cancer syndrome and predisposition to cancer. We, therefore, discussed and recommended the following at today's visit.   DISCUSSION: We discussed that 5 - 10% of breast cancer, as well as pancreatic cancer, is hereditary, with most cases associated with BRCA mutations.  There are other genes that can be associated with hereditary breast and pancreatic cancer syndromes.  These include ATM, CHEK2 and PALB2 for breast cancer, and ATM, PALB2 and Lynch syndrome genes for pancreatic cancer.  We discussed that testing is beneficial for several reasons including knowing how to follow individuals after completing their treatment, identifying whether potential treatment options such as PARP inhibitors would be beneficial, and understand if other family members could be at risk for cancer and allow them to undergo genetic testing.   We reviewed the characteristics, features and inheritance patterns of hereditary cancer syndromes. We also discussed genetic testing, including the appropriate family members to test, the process of testing, insurance coverage  and turn-around-time for results. We discussed the implications of a negative, positive and/or variant of uncertain significant result. We recommended Ms. Lender pursue genetic testing for the common hereditary cancer gene panel. The Common Hereditary Gene Panel offered by [facility] includes sequencing and/or deletion duplication testing of the following 48 genes: APC, ATM, AXIN2, BARD1, BMPR1A, BRCA1, BRCA2, BRIP1, CDH1, CDK4, CDKN2A (p14ARF), CDKN2A (p16INK4a), CHEK2, CTNNA1, DICER1, EPCAM (Deletion/duplication testing only), GREM1 (promoter region deletion/duplication testing only), KIT, MEN1, MLH1, MSH2, MSH3, MSH6, MUTYH, NBN, NF1, NHTL1, PALB2, PDGFRA, PMS2, POLD1, POLE, PTEN, RAD50, RAD51C, RAD51D, RNF43, SDHB, SDHC, SDHD, SMAD4, SMARCA4. STK11, TP53, TSC1, TSC2, and VHL.  The following genes were evaluated for sequence changes only: SDHA and HOXB13 c.251G>A variant only.   Based on Ms. Remington's personal and family history of cancer, she meets medical criteria for genetic testing. Despite that she meets criteria, she may still have an out of pocket cost. We discussed that if her out of pocket cost for testing is over $100, the laboratory will call and confirm whether she wants to proceed with testing.  If the out of pocket cost of testing is less than $100 she will be billed by the genetic testing laboratory.   PLAN: After considering the risks, benefits, and limitations, Ms. Kuehnel provided informed consent to pursue genetic testing and the blood sample was sent to [facility] for analysis of the common hereditary cancer panel. Results should be available within approximately 2-3 weeks' time, at which point they will be disclosed by telephone to Ms. Kovarik, as will any additional recommendations warranted by these results. Ms. Grether will receive a summary of her genetic counseling visit and a copy of her results once available. This information will also be available in Epic.   Lastly, we encouraged  Ms. Gaynor to remain in contact with cancer genetics annually so that  we can continuously update the family history and inform her of any changes in cancer genetics and testing that may be of benefit for this family.   Ms. Fetterly questions were answered to her satisfaction today. Our contact information was provided should additional questions or concerns arise. Thank you for the referral and allowing [place] to share in the care of your patient.   Alania Overholt P. Florene Glen, [place], [facility] Certified Genetic Counselor [email or web address] phone: [phone]  The patient was seen for a total of 45 minutes in [facility] genetic counseling.  This patient was discussed with Drs. Magrinat, Lindi Adie and/or Burr Medico who agrees with the above.    _______________________________________________________________________ For Office Staff:  Number of people involved in session: 1 Was an Intern/ student involved with case: no

## 2018-07-27 ENCOUNTER — Encounter: Payer: Self-pay | Admitting: Hematology

## 2018-07-27 LAB — CANCER ANTIGEN 19-9: CA 19-9: 101 U/mL — ABNORMAL HIGH (ref 0–35)

## 2018-07-27 NOTE — Progress Notes (Signed)
Called pt to introduce myself as her Arboriculturist.  Unfortunately there aren't any foundations offering copay assistance for her Dx and the type of ins she has.  I offered the [employer], went over what it covers and gave her the income requirement.  Pt stated she's over the requirement so she doesn't qualify for the grant at this time.  I requested the front staff give her my card on 07/28/18 for any questions or concerns she may have in the future.

## 2018-07-28 ENCOUNTER — Other Ambulatory Visit: Payer: Self-pay

## 2018-07-28 ENCOUNTER — Telehealth: Payer: Self-pay | Admitting: *Deleted

## 2018-07-28 ENCOUNTER — Inpatient Hospital Stay: Payer: Medicare Other

## 2018-07-28 VITALS — BP 138/72 | HR 72 | Temp 98.8°F | Resp 18

## 2018-07-28 DIAGNOSIS — C250 Malignant neoplasm of head of pancreas: Secondary | ICD-10-CM

## 2018-07-28 MED ORDER — PEGFILGRASTIM-CBQV 6 MG/0.6ML ~~LOC~~ SOSY
PREFILLED_SYRINGE | SUBCUTANEOUS | Status: AC
  Filled 2018-07-28: qty 0.6

## 2018-07-28 MED ORDER — SODIUM CHLORIDE 0.9% FLUSH
10.0000 mL | INTRAVENOUS | Status: DC | PRN
  Filled 2018-07-28: qty 10

## 2018-07-28 MED ORDER — PEGFILGRASTIM-CBQV 6 MG/0.6ML ~~LOC~~ SOSY: 6.0000 mg | PREFILLED_SYRINGE | Freq: Once | SUBCUTANEOUS | Status: DC

## 2018-07-28 MED ORDER — HEPARIN SOD (PORK) LOCK FLUSH 100 UNIT/ML IV SOLN
500.0000 [IU] | Freq: Once | INTRAVENOUS | Status: DC | PRN
  Filled 2018-07-28: qty 5

## 2018-07-28 NOTE — Addendum Note (Signed)
Addended by: Carolynne Edouard B on: 07/28/2018 01:53 PM   Modules accepted: Orders

## 2018-07-28 NOTE — Patient Instructions (Signed)

## 2018-07-28 NOTE — Telephone Encounter (Signed)
Called Taylor Oconnor for chemotherapy F/U.  Patient is doing well.  Denies n/v.  Denies any new side effects or symptoms.  Port-a-cath hurts a little after they "Bowels  constipated this morning passing balls.  Nurse today told me to take senokot."  Bladder functioning well.  Eating and drinking well.  Instructed to drink 64 oz minimum daily or at least the day before, of and after treatment.  With report of stool resembling balls advised may need to drink more water to regulate bowels.  Hot beverages, soups and broth will also help bowels.  Advised bowels should move at least every three days however diarrhea is a side effect of Fluorouracil and Camptosar.  No laxatives or prudent use of laxatives with this treatment.  Denies rectal discomfort, pain or bleeding passing bowel contents earlier today.   Denies questions or needs at this time.  Encouraged to call [phone] Mon -Fri 8:00 am - 4:30 pm or anytime as needed for symptoms, changes or event outside office hours.

## 2018-07-28 NOTE — Progress Notes (Signed)
Pt returned today to have Chemo infusion pump D/C and Udenyca injection. PT did not have pre authorization for injection. Pt wishes to return tomorrow to receive injection. Sent high priority scheduling message in basket at this time

## 2018-07-28 NOTE — Telephone Encounter (Signed)
-----   Message from Sinda Du, RN sent at 07/26/2018  1:26 PM EDT ----- Regarding: Dr. Burr Medico - 1st chemo f/u 1st chemo f/u

## 2018-07-29 ENCOUNTER — Inpatient Hospital Stay: Payer: Medicare Other

## 2018-07-29 ENCOUNTER — Other Ambulatory Visit: Payer: Self-pay | Admitting: Medical

## 2018-07-29 ENCOUNTER — Encounter: Payer: Self-pay | Admitting: Hematology

## 2018-07-29 ENCOUNTER — Other Ambulatory Visit: Payer: Self-pay

## 2018-07-29 VITALS — BP 128/70 | HR 72 | Temp 98.8°F | Resp 18

## 2018-07-29 DIAGNOSIS — Z170 Estrogen receptor positive status [ER+]: Secondary | ICD-10-CM | POA: Diagnosis not present

## 2018-07-29 DIAGNOSIS — C250 Malignant neoplasm of head of pancreas: Secondary | ICD-10-CM | POA: Diagnosis not present

## 2018-07-29 DIAGNOSIS — C787 Secondary malignant neoplasm of liver and intrahepatic bile duct: Secondary | ICD-10-CM | POA: Diagnosis not present

## 2018-07-29 DIAGNOSIS — C50411 Malignant neoplasm of upper-outer quadrant of right female breast: Secondary | ICD-10-CM | POA: Diagnosis not present

## 2018-07-29 DIAGNOSIS — K831 Obstruction of bile duct: Secondary | ICD-10-CM | POA: Diagnosis not present

## 2018-07-29 DIAGNOSIS — K1230 Oral mucositis (ulcerative), unspecified: Secondary | ICD-10-CM | POA: Diagnosis not present

## 2018-07-29 DIAGNOSIS — N182 Chronic kidney disease, stage 2 (mild): Secondary | ICD-10-CM | POA: Diagnosis not present

## 2018-07-29 DIAGNOSIS — E1122 Type 2 diabetes mellitus with diabetic chronic kidney disease: Secondary | ICD-10-CM | POA: Diagnosis not present

## 2018-07-29 DIAGNOSIS — E785 Hyperlipidemia, unspecified: Secondary | ICD-10-CM | POA: Diagnosis not present

## 2018-07-29 DIAGNOSIS — M546 Pain in thoracic spine: Secondary | ICD-10-CM | POA: Diagnosis not present

## 2018-07-29 DIAGNOSIS — K219 Gastro-esophageal reflux disease without esophagitis: Secondary | ICD-10-CM | POA: Diagnosis not present

## 2018-07-29 DIAGNOSIS — I129 Hypertensive chronic kidney disease with stage 1 through stage 4 chronic kidney disease, or unspecified chronic kidney disease: Secondary | ICD-10-CM | POA: Diagnosis not present

## 2018-07-29 DIAGNOSIS — K449 Diaphragmatic hernia without obstruction or gangrene: Secondary | ICD-10-CM | POA: Diagnosis not present

## 2018-07-29 DIAGNOSIS — Z9884 Bariatric surgery status: Secondary | ICD-10-CM | POA: Diagnosis not present

## 2018-07-29 DIAGNOSIS — Z803 Family history of malignant neoplasm of breast: Secondary | ICD-10-CM | POA: Diagnosis not present

## 2018-07-29 DIAGNOSIS — Z79899 Other long term (current) drug therapy: Secondary | ICD-10-CM | POA: Diagnosis not present

## 2018-07-29 DIAGNOSIS — Z5111 Encounter for antineoplastic chemotherapy: Secondary | ICD-10-CM | POA: Diagnosis not present

## 2018-07-29 DIAGNOSIS — G4700 Insomnia, unspecified: Secondary | ICD-10-CM | POA: Diagnosis not present

## 2018-07-29 DIAGNOSIS — Z79811 Long term (current) use of aromatase inhibitors: Secondary | ICD-10-CM | POA: Diagnosis not present

## 2018-07-29 MED ORDER — PEGFILGRASTIM-CBQV 6 MG/0.6ML ~~LOC~~ SOSY
PREFILLED_SYRINGE | SUBCUTANEOUS | Status: AC
  Filled 2018-07-29: qty 0.6

## 2018-07-29 MED ORDER — MAGIC MOUTHWASH: 5.0000 mL | Freq: Four times a day (QID) | ORAL | 1 refills | Status: DC | PRN

## 2018-07-29 MED ORDER — PEGFILGRASTIM-CBQV 6 MG/0.6ML ~~LOC~~ SOSY
6.0000 mg | PREFILLED_SYRINGE | Freq: Once | SUBCUTANEOUS | Status: AC
  Administered 2018-07-29: 6 mg via SUBCUTANEOUS

## 2018-07-29 NOTE — Patient Instructions (Signed)

## 2018-08-01 ENCOUNTER — Encounter: Payer: Self-pay | Admitting: Genetic Counselor

## 2018-08-01 ENCOUNTER — Telehealth: Payer: Self-pay | Admitting: Genetic Counselor

## 2018-08-01 ENCOUNTER — Ambulatory Visit: Payer: Medicare Other

## 2018-08-01 ENCOUNTER — Other Ambulatory Visit: Payer: Medicare Other

## 2018-08-01 ENCOUNTER — Telehealth: Payer: Self-pay | Admitting: Nurse Practitioner

## 2018-08-01 ENCOUNTER — Ambulatory Visit: Payer: Self-pay | Admitting: Genetic Counselor

## 2018-08-01 ENCOUNTER — Ambulatory Visit: Payer: Medicare Other | Admitting: Hematology

## 2018-08-01 DIAGNOSIS — Z1379 Encounter for other screening for genetic and chromosomal anomalies: Secondary | ICD-10-CM

## 2018-08-01 NOTE — Telephone Encounter (Signed)
Revealed negative genetic testing.  Discussed that we do not know why she has breast cancer or why there is cancer in the family. It could be due to a different gene that we are not testing, or maybe our current technology may not be able to pick something up.  It will be important for her to keep in contact with genetics to keep up with whether additional testing may be needed. 

## 2018-08-01 NOTE — Telephone Encounter (Signed)
I called patient regarding her appts 7/21. She was not aware of scheduled appts. She is having moderate fatigue, mucositis, decreased appetite, n/v despite zofran AM and phenergan in PM. She is using magic mouthwash without much benefit. Mouth is "raw." She is eating very little. Thinks she has lost weight. She will benefit from IVF. She will see [facility] provider tomorrow after lab. I spoke to [place] and he is aware. Patient appreciates the call and agrees with the plan. Cira Rue, NP  08/01/2018

## 2018-08-01 NOTE — Progress Notes (Signed)
HPI:  Taylor Oconnor was previously seen in the [facility] due to a personal and family history of cancer and concerns regarding a hereditary predisposition to cancer. Please refer to our prior cancer genetics clinic note for more information regarding our discussion, assessment and recommendations, at the time. Taylor Oconnor recent genetic test results were disclosed to her, as were recommendations warranted by these results. These results and recommendations are discussed in more detail below.  CANCER HISTORY:  Oncology History  Malignant neoplasm of upper-outer quadrant of right breast in female, estrogen receptor positive ([place])  11/15/2014 Mammogram   Right breast: mass warranting further evaluation   11/27/2014 Breast US   0.4 x 0.4 x 0.6 cm irregular hypoechoic mass within the right breast 10 o'clock position 7 cm from the nipple. Additionally there is a 0.7 x 0.8 x 0.8 cm taller than wide irregular hypoechoic mass with posterior acoustic shadowing within the right breast.   11/28/2014 Initial Biopsy   Right breast core needle biopsies, 2 masses: both invasive ductal carcinoma, grade 1, ER+ (100%), PR+ (85% -larger), HER2/neu negative, Ki67 5-10%.  Smaller mass PR-   11/28/2014 Clinical Stage   Stage IA: T1b N0   12/28/2014 Definitive Surgery   Right lumpectomies/SLNB: invasive ductal carcinoma, grade 2, repeat HER2/neu negative, 0/2 LN   12/28/2014 Pathologic Stage   Stage IA: npT1c pN0   12/28/2014 Oncotype testing   RS 16 (10% ROR)   02/18/2015 - 04/03/2015 Radiation Therapy    Right breast / 45 Gray at 1.8 [place] [employer] per fraction x 25 fractions; right breast boost / 16 Gray at [employer] per fraction x 8 fractions   05/13/2015 -  Anti-estrogen oral therapy   Anastrozole 1 mg daily.   05/30/2015 Survivorship   SCP mailed to patient in lieu of in person visit   08/01/2018 Genetic Testing   Negative genetic testing on the common hereditary cancer panel.  The Common  Hereditary Gene Panel offered by [facility] includes sequencing and/or deletion duplication testing of the following 48 genes: APC, ATM, AXIN2, BARD1, BMPR1A, BRCA1, BRCA2, BRIP1, CDH1, CDK4, CDKN2A (p14ARF), CDKN2A (p16INK4a), CHEK2, CTNNA1, DICER1, EPCAM (Deletion/duplication testing only), GREM1 (promoter region deletion/duplication testing only), KIT, MEN1, MLH1, MSH2, MSH3, MSH6, MUTYH, NBN, NF1, NHTL1, PALB2, PDGFRA, PMS2, POLD1, POLE, PTEN, RAD50, RAD51C, RAD51D, RNF43, SDHB, SDHC, SDHD, SMAD4, SMARCA4. STK11, TP53, TSC1, TSC2, and VHL.  The following genes were evaluated for sequence changes only: SDHA and HOXB13 c.251G>A variant only. The report date is August 01, 2018.   Pancreatic cancer ([place])  06/30/2018 Imaging   CT AP WO Contrast 06/30/18  IMPRESSION: 1. New common bile duct dilatation and intrahepatic bile duct dilatation. New fullness of the pancreatic head which could represent rapidly developing obstructive neoplastic process or sequela of recent localized pancreatitis. Recommend further characterization with MRCP or ERCP. 2. No other acute or significant findings. No bowel obstruction or evidence of bowel wall inflammation. No free fluid or abscess collection. No free intraperitoneal air. 3. Additional chronic/incidental findings detailed above.   07/01/2018 Procedure   ERCP by Dr Fuller Plan on 07/01/18  IMPRESSION - A single severe segmental biliary stricture was found in the lower third of the common bile duct. The stricture was malignant appearing. Brushed. - The entire biliary tree proximal to the stricture was dilated, secondary to the stricture. - Prior cholecystectomy. - A biliary sphincterotomy was performed. - One plastic stent was placed into the common bile duct.   07/01/2018 Pathology Results  Diagnosis 07/01/18 BILE DUCT BRUSHING (SPECIMEN 1 OF 1 COLLECTED 07/01/2018) SUSPICIOUS FOR ADENOCARCINOMA. SEE COMMENT.   07/14/2018 Initial Diagnosis   Pancreatic cancer ([place])    07/14/2018 Procedure   EUS by Dr Ardis Hughs on 07/14/18 IMPRESSION PENDING    07/14/2018 Cancer Staging   Staging form: Exocrine Pancreas, AJCC 8th Edition - Clinical: Stage IB (cT2, cN0, cM0) - Signed by Truitt Merle, MD on 07/15/2018   07/21/2018 PET scan   IMPRESSION: 1. Mass in head of pancreas is intensely hypermetabolic compatible with primary pancreatic adenocarcinoma. 2. Enlarged and FDG avid porta hepatic lymph node compatible with metastatic adenopathy. 3. Extensive FDG avid liver metastases involve both lobes of liver and exhibit marked progression when compared with MRI from 07/01/2018. 4.  Aortic Atherosclerosis (ICD10-I70.0).   07/26/2018 -  Chemotherapy   FOLFIRINOX q2weeks starting 07/26/18     FAMILY HISTORY:  We obtained a detailed, 4-generation family history.  Significant diagnoses are listed below: Family History  Problem Relation Age of Onset  . Cirrhosis Mother   . Alcohol abuse Mother   . Liver disease Mother   . Diabetes Father   . Hypertension Father   . Stroke Father   . Heart disease Father   . AAA (abdominal aortic aneurysm) Sister 23       twin sister  . Cancer Cousin        Breast  . Breast cancer Cousin        x3  . Colon cancer Neg Hx   . Rectal cancer Neg Hx   . Stomach cancer Neg Hx     The patient has one daughter who is cancer free.  She has a twin sister who died from an AAA, and an older sister who is living.  Neither had cancer.  Both parents are deceased.    The patient's mother died from complications of alcohol abuse.  She had five brothers and a sister.  The sister is living and has a daughter who had breast cancer.  The maternal grandparents are deceased.  The patient's father died of a stroke at 46.  He had a brother and sister who are cancer free.  The paternal grandparents are deceased.  Taylor Oconnor is unaware of previous family history of genetic testing for hereditary cancer risks. Patient's maternal ancestors are of [place] descent,  and paternal ancestors are of [place] descent. There is no reported Ashkenazi Jewish ancestry. There is no known consanguinity.   GENETIC TEST RESULTS: Genetic testing reported out on August 01, 2018 through the Common hereditary cancer panel found no pathogenic mutations. The Common Hereditary Gene Panel offered by [facility] includes sequencing and/or deletion duplication testing of the following 48 genes: APC, ATM, AXIN2, BARD1, BMPR1A, BRCA1, BRCA2, BRIP1, CDH1, CDK4, CDKN2A (p14ARF), CDKN2A (p16INK4a), CHEK2, CTNNA1, DICER1, EPCAM (Deletion/duplication testing only), GREM1 (promoter region deletion/duplication testing only), KIT, MEN1, MLH1, MSH2, MSH3, MSH6, MUTYH, NBN, NF1, NHTL1, PALB2, PDGFRA, PMS2, POLD1, POLE, PTEN, RAD50, RAD51C, RAD51D, RNF43, SDHB, SDHC, SDHD, SMAD4, SMARCA4. STK11, TP53, TSC1, TSC2, and VHL.  The following genes were evaluated for sequence changes only: SDHA and HOXB13 c.251G>A variant only. The test report has been scanned into EPIC and is located under the Molecular Pathology section of the Results Review tab.  A portion of the result report is included below for reference.     We discussed with Taylor Oconnor that because current genetic testing is not perfect, it is possible there may be a gene mutation in one of these genes  that current testing cannot detect, but that chance is small.  We also discussed, that there could be another gene that has not yet been discovered, or that we have not yet tested, that is responsible for the cancer diagnoses in the family. It is also possible there is a hereditary cause for the cancer in the family that Taylor Oconnor did not inherit and therefore was not identified in her testing.  Therefore, it is important to remain in touch with cancer genetics in the future so that we can continue to offer Taylor Oconnor the most up to date genetic testing.   ADDITIONAL GENETIC TESTING: We discussed with Taylor Oconnor that there are other genes that are associated with  increased cancer risk that can be analyzed. Should Taylor Oconnor wish to pursue additional genetic testing, we are happy to discuss and coordinate this testing, at any time.    CANCER SCREENING RECOMMENDATIONS: Taylor Oconnor test result is considered negative (normal).  This means that we have not identified a hereditary cause for her personal and family history of cancer at this time. Most cancers happen by chance and this negative test suggests that her cancer may fall into this category.    While reassuring, this does not definitively rule out a hereditary predisposition to cancer. It is still possible that there could be genetic mutations that are undetectable by current technology. There could be genetic mutations in genes that have not been tested or identified to increase cancer risk.  Therefore, it is recommended she continue to follow the cancer management and screening guidelines provided by her oncology and primary healthcare provider.   An individual's cancer risk and medical management are not determined by genetic test results alone. Overall cancer risk assessment incorporates additional factors, including personal medical history, family history, and any available genetic information that may result in a personalized plan for cancer prevention and surveillance  RECOMMENDATIONS FOR FAMILY MEMBERS:  Individuals in this family might be at some increased risk of developing cancer, over the general population risk, simply due to the family history of cancer.  We recommended women in this family have a yearly mammogram beginning at age 87, or 1 years younger than the earliest onset of cancer, an annual clinical breast exam, and perform monthly breast self-exams. Women in this family should also have a gynecological exam as recommended by their primary provider. All family members should have a colonoscopy by age 77.  FOLLOW-UP: Lastly, we discussed with Taylor Oconnor that cancer genetics is a rapidly  advancing field and it is possible that new genetic tests will be appropriate for her and/or her family members in the future. We encouraged her to remain in contact with cancer genetics on an annual basis so we can update her personal and family histories and let her know of advances in cancer genetics that may benefit this family.   Our contact number was provided. Taylor Oconnor questions were answered to her satisfaction, and she knows she is welcome to call us at anytime with additional questions or concerns.   Roma Kayser, MS, [facility] Certified Genetic Counselor [email or web address]

## 2018-08-02 ENCOUNTER — Telehealth: Payer: Self-pay | Admitting: Medical

## 2018-08-02 ENCOUNTER — Inpatient Hospital Stay: Payer: Medicare Other

## 2018-08-02 ENCOUNTER — Other Ambulatory Visit: Payer: Self-pay

## 2018-08-02 ENCOUNTER — Inpatient Hospital Stay (HOSPITAL_BASED_OUTPATIENT_CLINIC_OR_DEPARTMENT_OTHER): Payer: Medicare Other | Admitting: Medical

## 2018-08-02 VITALS — BP 128/73 | HR 83 | Temp 98.1°F | Resp 18 | Ht 63.0 in | Wt 177.0 lb

## 2018-08-02 DIAGNOSIS — N182 Chronic kidney disease, stage 2 (mild): Secondary | ICD-10-CM

## 2018-08-02 DIAGNOSIS — K219 Gastro-esophageal reflux disease without esophagitis: Secondary | ICD-10-CM

## 2018-08-02 DIAGNOSIS — Z79899 Other long term (current) drug therapy: Secondary | ICD-10-CM | POA: Diagnosis not present

## 2018-08-02 DIAGNOSIS — Z95828 Presence of other vascular implants and grafts: Secondary | ICD-10-CM

## 2018-08-02 DIAGNOSIS — K449 Diaphragmatic hernia without obstruction or gangrene: Secondary | ICD-10-CM | POA: Diagnosis not present

## 2018-08-02 DIAGNOSIS — F419 Anxiety disorder, unspecified: Secondary | ICD-10-CM

## 2018-08-02 DIAGNOSIS — Z5111 Encounter for antineoplastic chemotherapy: Secondary | ICD-10-CM

## 2018-08-02 DIAGNOSIS — C787 Secondary malignant neoplasm of liver and intrahepatic bile duct: Secondary | ICD-10-CM

## 2018-08-02 DIAGNOSIS — C50411 Malignant neoplasm of upper-outer quadrant of right female breast: Secondary | ICD-10-CM

## 2018-08-02 DIAGNOSIS — C250 Malignant neoplasm of head of pancreas: Secondary | ICD-10-CM

## 2018-08-02 DIAGNOSIS — K831 Obstruction of bile duct: Secondary | ICD-10-CM | POA: Diagnosis not present

## 2018-08-02 DIAGNOSIS — M546 Pain in thoracic spine: Secondary | ICD-10-CM

## 2018-08-02 DIAGNOSIS — E785 Hyperlipidemia, unspecified: Secondary | ICD-10-CM

## 2018-08-02 DIAGNOSIS — Z803 Family history of malignant neoplasm of breast: Secondary | ICD-10-CM

## 2018-08-02 DIAGNOSIS — I129 Hypertensive chronic kidney disease with stage 1 through stage 4 chronic kidney disease, or unspecified chronic kidney disease: Secondary | ICD-10-CM

## 2018-08-02 DIAGNOSIS — Z79811 Long term (current) use of aromatase inhibitors: Secondary | ICD-10-CM | POA: Diagnosis not present

## 2018-08-02 DIAGNOSIS — Z9884 Bariatric surgery status: Secondary | ICD-10-CM | POA: Diagnosis not present

## 2018-08-02 DIAGNOSIS — E1122 Type 2 diabetes mellitus with diabetic chronic kidney disease: Secondary | ICD-10-CM | POA: Diagnosis not present

## 2018-08-02 DIAGNOSIS — Z170 Estrogen receptor positive status [ER+]: Secondary | ICD-10-CM | POA: Diagnosis not present

## 2018-08-02 DIAGNOSIS — E282 Polycystic ovarian syndrome: Secondary | ICD-10-CM

## 2018-08-02 DIAGNOSIS — G4700 Insomnia, unspecified: Secondary | ICD-10-CM | POA: Diagnosis not present

## 2018-08-02 DIAGNOSIS — K1230 Oral mucositis (ulcerative), unspecified: Secondary | ICD-10-CM | POA: Diagnosis not present

## 2018-08-02 DIAGNOSIS — K1231 Oral mucositis (ulcerative) due to antineoplastic therapy: Secondary | ICD-10-CM

## 2018-08-02 LAB — CBC WITH DIFFERENTIAL (CANCER CENTER ONLY)
Abs Immature Granulocytes: 0.01 10*3/uL (ref 0.00–0.07)
Basophils Absolute: 0 10*3/uL (ref 0.0–0.1)
Basophils Relative: 1 %
Eosinophils Absolute: 0.2 10*3/uL (ref 0.0–0.5)
Eosinophils Relative: 3 %
HCT: 37.2 % (ref 36.0–46.0)
Hemoglobin: 12.4 g/dL (ref 12.0–15.0)
Immature Granulocytes: 0 %
Lymphocytes Relative: 24 %
Lymphs Abs: 1.3 10*3/uL (ref 0.7–4.0)
MCH: 30.8 pg (ref 26.0–34.0)
MCHC: 33.3 g/dL (ref 30.0–36.0)
MCV: 92.3 fL (ref 80.0–100.0)
Monocytes Absolute: 0.3 10*3/uL (ref 0.1–1.0)
Monocytes Relative: 5 %
Neutro Abs: 3.6 10*3/uL (ref 1.7–7.7)
Neutrophils Relative %: 67 %
Platelet Count: 204 10*3/uL (ref 150–400)
RBC: 4.03 MIL/uL (ref 3.87–5.11)
RDW: 12.2 % (ref 11.5–15.5)
WBC Count: 5.3 10*3/uL (ref 4.0–10.5)
nRBC: 0 % (ref 0.0–0.2)

## 2018-08-02 LAB — CMP (CANCER CENTER ONLY)
ALT: 10 U/L (ref 0–44)
AST: 11 U/L — ABNORMAL LOW (ref 15–41)
Albumin: 3.5 g/dL (ref 3.5–5.0)
Alkaline Phosphatase: 142 U/L — ABNORMAL HIGH (ref 38–126)
Anion gap: 10 (ref 5–15)
BUN: 20 mg/dL (ref 8–23)
CO2: 28 mmol/L (ref 22–32)
Calcium: 9.1 mg/dL (ref 8.9–10.3)
Chloride: 100 mmol/L (ref 98–111)
Creatinine: 0.79 mg/dL (ref 0.44–1.00)
GFR, Est AFR Am: >60 mL/min (ref >60)
GFR, Estimated: >60 mL/min (ref >60)
Glucose, Bld: 130 mg/dL — ABNORMAL HIGH (ref 70–99)
Potassium: 3.7 mmol/L (ref 3.5–5.1)
Sodium: 138 mmol/L (ref 135–145)
Total Bilirubin: 0.7 mg/dL (ref 0.3–1.2)
Total Protein: 6.6 g/dL (ref 6.5–8.1)

## 2018-08-02 MED ORDER — SODIUM CHLORIDE 0.9% FLUSH
10.0000 mL | Freq: Once | INTRAVENOUS | Status: AC
  Administered 2018-08-02: 10 mL
  Filled 2018-08-02: qty 10

## 2018-08-02 MED ORDER — HEPARIN SOD (PORK) LOCK FLUSH 100 UNIT/ML IV SOLN
500.0000 [IU] | Freq: Once | INTRAVENOUS | Status: DC
  Filled 2018-08-02: qty 5

## 2018-08-02 MED ORDER — SODIUM CHLORIDE 0.9 % IV SOLN
Freq: Once | INTRAVENOUS | Status: AC
  Administered 2018-08-02: 12:00:00 via INTRAVENOUS
  Filled 2018-08-02: qty 250

## 2018-08-02 MED ORDER — HEPARIN SOD (PORK) LOCK FLUSH 100 UNIT/ML IV SOLN
500.0000 [IU] | Freq: Once | INTRAVENOUS | Status: AC
  Administered 2018-08-02: 13:00:00 500 [IU]
  Filled 2018-08-02: qty 5

## 2018-08-02 MED ORDER — LIDOCAINE VISCOUS HCL 2 % MT SOLN: 5.0000 mL | OROMUCOSAL | 1 refills | Status: DC | PRN

## 2018-08-02 NOTE — Patient Instructions (Signed)

## 2018-08-02 NOTE — Telephone Encounter (Signed)
No los per 7/21. °

## 2018-08-02 NOTE — Progress Notes (Signed)
Pt received 1L IVF NS today, tolerated well.  Ate and drank some during infusion.  VSS.  Reports feeling a bit better at end of infusion.  Ambulatory w/steady gait to exit with belongings.

## 2018-08-03 ENCOUNTER — Ambulatory Visit: Payer: Medicare Other | Admitting: Adult Health

## 2018-08-03 NOTE — Progress Notes (Signed)
[facility] Progress Note   Taylor Oconnor [id number] 12/02/1950 68 y.o.  Taylor Oconnor is managed by Dr. Truitt Merle  Actively treated with chemotherapy/immunotherapy/hormonal therapy: yes  Current therapy:  FOLFIRINOX  Last treated:  07/26/2018 (cycle 1)  Next scheduled appointment with provider: 08/08/2018  Assessment: Plan:    Malignant neoplasm of the head of the pancreas: The patient is status post cycle 1 of FOLFIRI Taylor Oconnor which was dosed on 07/26/2018.  She is scheduled to return on 08/08/2018.  Mucositis: The patient was instructed to continue using Magic mouthwash.  She was additionally given a prescription for viscous lidocaine which she can use by itself or with Magic mouthwash.  The patient was given 1 L of IV saline today.  Please see After Visit Summary for patient specific instructions.  Future Appointments  Date Time Provider [place]  08/08/2018  8:15 AM [facility] LAB 4 [facility] None  08/08/2018  8:30 AM [facility] INFUSION [facility] None  08/08/2018  8:45 AM Alla Feeling, NP [facility] None  08/08/2018  9:45 AM [facility] INFUSION [facility] None  08/10/2018  3:45 PM Liane Comber, NP [facility] None  08/22/2018 10:30 AM [facility] ONLY [facility] None  08/22/2018 10:45 AM [facility] FLUSH [facility] None  08/22/2018 11:15 AM Alla Feeling, NP [facility] None  08/22/2018 12:00 PM [facility] INFUSION [facility] None  11/07/2018  2:30 PM Unk Pinto, MD [facility] None  01/19/2019  3:00 PM Vicie Mutters, PA-C [facility] None  01/26/2019  9:00 AM [facility] LAB 1 [facility] None  01/26/2019  9:30 AM Magrinat, Virgie Dad, MD [facility] None  05/08/2019  2:00 PM Unk Pinto, MD [facility] None    No orders of the defined types were placed in this encounter.      Subjective:   Patient ID:  Taylor Oconnor is a 68 y.o. (DOB January 15, 1950) female.  Chief Complaint:  Chief Complaint  Patient presents with   Fatigue     HPI Taylor Oconnor   is a 68 year old female with a diagnosis of a malignant neoplasm of the head of the pancreas.  She is managed by Dr.Feng and is status post cycle 1 of FOLFIRI Taylor Oconnor which was dosed on 07/26/2026.  She presents to the clinic today with a report that she has been having dry heaves and nausea with no vomiting.  She has fatigue.  She has been having mouth sores despite her use of Magic mouthwash.  Her p.o. intake is decreased due to her mouth sores, loss of appetite, and changes in the taste of food.  She has Compazine and Phenergan at home.  She denies fevers, chills, sweats, diarrhea, or constipation.  Medications: I have reviewed the patient's current medications.  Allergies:  Allergies  Allergen Reactions   Codeine Itching   Lipitor [Atorvastatin] Other (See Comments)    Severe fatigue. ? memory loss.   Ace Inhibitors Cough    Past Medical History:  Diagnosis Date   Allergy    SEASONAL   Anxiety    At risk for difficult insertion of breathing tube    Breast cancer of upper-outer quadrant of right female breast ([place]) 12/03/2014   Difficult intubation pt has large tonsils,   Family history of breast cancer    GERD (gastroesophageal reflux disease)    hx - resolved with lap band surgery   H/O hiatal hernia    resolved with band surgery   H/O laparoscopic adjustable gastric banding 2010   History of kidney stones 02/2018  Hyperlipidemia    Hypertension    Hypokalemia    Insomnia    tx with zanax   Obesity    Pancreatic mass    PCOS (polycystic ovarian syndrome)    Personal history of radiation therapy    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) 12/14/2012   Type II or unspecified type diabetes mellitus without mention of complication, not stated as uncontrolled 12/14/2012   no meds, diet controlled   Wears contact lenses    Wears glasses     Past Surgical History:  Procedure Laterality Date   BACK SURGERY     cervical    BILIARY STENT PLACEMENT N/A 07/01/2018   Procedure: BILIARY STENT PLACEMENT;  Surgeon: Ladene Artist, MD;  Location: [facility];  Service: Endoscopy;  Laterality: N/A;  cbd brushing   BREAST BIOPSY     BREAST LUMPECTOMY Right 2016   BREAST LUMPECTOMY WITH NEEDLE LOCALIZATION AND AXILLARY SENTINEL LYMPH NODE BX Right 12/28/2014   Procedure: RIGHT BREAST LUMPECTOMY WITH TWO (2) NEEDLE LOCALIZATION AND AXILLARY SENTINEL LYMPH NODE BX;  Surgeon: Alphonsa Overall, MD;  Location: [facility];  Service: General;  Laterality: Right;   CESAREAN SECTION     x 1   CHOLECYSTECTOMY     COLONOSCOPY  09/29/2004   normal   CYSTOSCOPY WITH RETROGRADE PYELOGRAM, URETEROSCOPY AND STENT PLACEMENT Right 03/24/2018   Procedure: CYSTOSCOPY WITH RETROGRADE PYELOGRAM, URETEROSCOPY AND STENT PLACEMENT;  Surgeon: Franchot Gallo, MD;  Location: [facility];  Service: Urology;  Laterality: Right;  1 HR   DIAGNOSTIC LAPAROSCOPY     ERCP N/A 07/01/2018   Procedure: ENDOSCOPIC RETROGRADE CHOLANGIOPANCREATOGRAPHY (ERCP);  Surgeon: Ladene Artist, MD;  Location: [facility];  Service: Endoscopy;  Laterality: N/A;   ESOPHAGOGASTRODUODENOSCOPY (EGD) WITH PROPOFOL N/A 07/14/2018   Procedure: ESOPHAGOGASTRODUODENOSCOPY (EGD) WITH PROPOFOL;  Surgeon: Milus Banister, MD;  Location: [facility];  Service: Endoscopy;  Laterality: N/A;   EUS N/A 07/14/2018   Procedure: UPPER ENDOSCOPIC ULTRASOUND (EUS) RADIAL;  Surgeon: Milus Banister, MD;  Location: [facility];  Service: Endoscopy;  Laterality: N/A;   FINE NEEDLE ASPIRATION  07/14/2018   Procedure: FINE NEEDLE ASPIRATION (FNA) RADIAL;  Surgeon: Milus Banister, MD;  Location: [facility];  Service: Endoscopy;;   HERNIA REPAIR  2008   IR IMAGING GUIDED PORT INSERTION  07/20/2018   LAPAROSCOPIC APPENDECTOMY N/A 08/06/2012   Procedure: APPENDECTOMY LAPAROSCOPIC;  Surgeon: Earnstine Regal, MD;  Location: [facility];  Service: General;   Laterality: N/A;   LAPAROSCOPIC GASTRIC BANDING WITH HIATAL HERNIA REPAIR N/A Nov 2008   SPHINCTEROTOMY  07/01/2018   Procedure: SPHINCTEROTOMY;  Surgeon: Ladene Artist, MD;  Location: [facility];  Service: Endoscopy;;   TUBAL LIGATION      Family History  Problem Relation Age of Onset   Cirrhosis Mother    Alcohol abuse Mother    Liver disease Mother    Diabetes Father    Hypertension Father    Stroke Father    Heart disease Father    AAA (abdominal aortic aneurysm) Sister 53       twin sister   Cancer Cousin        Breast   Breast cancer Cousin        x3   Colon cancer Neg Hx    Rectal cancer Neg Hx    Stomach cancer Neg Hx     Social History   Socioeconomic History   Marital status: Married  Spouse name: Not on file   Number of children: 1   Years of education: Not on file   Highest education level: Not on file  Occupational History   Not on file  Social Needs   Financial resource strain: Not on file   Food insecurity    Worry: Not on file    Inability: Not on file   Transportation needs    Medical: Not on file    Non-medical: Not on file  Tobacco Use   Smoking status: Never Smoker   Smokeless tobacco: Never Used  Substance and Sexual Activity   Alcohol use: No    Alcohol/week: 0.0 standard drinks   Drug use: No   Sexual activity: Not Currently    Birth control/protection: Post-menopausal  Lifestyle   Physical activity    Days per week: Not on file    Minutes per session: Not on file   Stress: Not on file  Relationships   Social connections    Talks on phone: Not on file    Gets together: Not on file    Attends religious service: Not on file    Active member of club or organization: Not on file    Attends meetings of clubs or organizations: Not on file    Relationship status: Not on file   Intimate partner violence    Fear of current or ex partner: Not on file    Emotionally abused: Not on file     Physically abused: Not on file    Forced sexual activity: Not on file  Other Topics Concern   Not on file  Social History Narrative   Not on file    Past Medical History, Surgical history, Social history, and Family history were reviewed and updated as appropriate.   Please see review of systems for further details on the patient's review from today.   Review of Systems:  Review of Systems  Constitutional: Positive for appetite change and fatigue. Negative for chills, diaphoresis and fever.  HENT: Positive for mouth sores and trouble swallowing. Negative for sore throat.   Respiratory: Negative for cough, shortness of breath and wheezing.   Cardiovascular: Negative for chest pain and palpitations.  Gastrointestinal: Positive for nausea. Negative for constipation, diarrhea and vomiting.    Objective:   Physical Exam:  BP 128/73 (BP Location: Left Arm, Patient Position: Sitting)    Pulse 83    Temp 98.1 F (36.7 C) (Oral)    Resp 18    Ht 5\' 3"  (1.6 m)    Wt 177 lb (80.3 kg)    SpO2 98%    BMI 31.35 kg/m  ECOG: 1  Physical Exam Constitutional:      General: She is not in acute distress.    Appearance: She is not toxic-appearing or diaphoretic.  HENT:     Head: Normocephalic and atraumatic.     Mouth/Throat:     Mouth: Mucous membranes are moist.     Pharynx: No oropharyngeal exudate or posterior oropharyngeal erythema.     Comments: The patient has several superficial 5 to 6 mm ulcerations in her hard palate and bilateral buccal membranes. Eyes:     General: No scleral icterus.       Right eye: No discharge.        Left eye: No discharge.  Cardiovascular:     Rate and Rhythm: Normal rate and regular rhythm.     Heart sounds: Normal heart sounds. No murmur. No friction rub. No gallop.  Pulmonary:     Effort: Pulmonary effort is normal. No respiratory distress.     Breath sounds: Normal breath sounds. No wheezing or rales.  Skin:    General: Skin is warm and dry.    Neurological:     Mental Status: She is alert.     Gait: Gait normal.  Psychiatric:        Mood and Affect: Mood normal.        Behavior: Behavior normal.        Thought Content: Thought content normal.        Judgment: Judgment normal.     Lab Review:     Component Value Date/Time   NA 138 08/02/2018 1124   NA 141 12/08/2016 1432   K 3.7 08/02/2018 1124   K 3.9 12/08/2016 1432   CL 100 08/02/2018 1124   CO2 28 08/02/2018 1124   CO2 27 12/08/2016 1432   GLUCOSE 130 (H) 08/02/2018 1124   GLUCOSE 102 12/08/2016 1432   BUN 20 08/02/2018 1124   BUN 19.3 12/08/2016 1432   CREATININE 0.79 08/02/2018 1124   CREATININE 0.72 06/29/2018 1136   CREATININE 1.0 12/08/2016 1432   CALCIUM 9.1 08/02/2018 1124   CALCIUM 9.5 12/08/2016 1432   PROT 6.6 08/02/2018 1124   PROT 6.8 12/08/2016 1432   ALBUMIN 3.5 08/02/2018 1124   ALBUMIN 3.5 12/08/2016 1432   AST 11 (L) 08/02/2018 1124   AST 16 12/08/2016 1432   ALT 10 08/02/2018 1124   ALT 22 12/08/2016 1432   ALKPHOS 142 (H) 08/02/2018 1124   ALKPHOS 61 12/08/2016 1432   BILITOT 0.7 08/02/2018 1124   BILITOT 0.30 12/08/2016 1432   GFRNONAA >60 08/02/2018 1124   GFRNONAA 87 06/29/2018 1136   GFRAA >60 08/02/2018 1124   GFRAA 101 06/29/2018 1136       Component Value Date/Time   WBC 5.3 08/02/2018 1124   WBC 7.4 07/20/2018 1125   RBC 4.03 08/02/2018 1124   HGB 12.4 08/02/2018 1124   HGB 13.2 12/08/2016 1432   HCT 37.2 08/02/2018 1124   HCT 40.0 12/08/2016 1432   PLT 204 08/02/2018 1124   PLT 343 12/08/2016 1432   MCV 92.3 08/02/2018 1124   MCV 95.0 12/08/2016 1432   MCH 30.8 08/02/2018 1124   MCHC 33.3 08/02/2018 1124   RDW 12.2 08/02/2018 1124   RDW 13.1 12/08/2016 1432   LYMPHSABS 1.3 08/02/2018 1124   LYMPHSABS 2.5 12/08/2016 1432   MONOABS 0.3 08/02/2018 1124   MONOABS 0.8 12/08/2016 1432   EOSABS 0.2 08/02/2018 1124   EOSABS 0.2 12/08/2016 1432   BASOSABS 0.0 08/02/2018 1124   BASOSABS 0.0 12/08/2016 1432    -------------------------------  Imaging from last 24 hours (if applicable):  Radiology interpretation: Nm Pet Image Initial (pi) Skull Base To Thigh  Result Date: 07/21/2018 CLINICAL DATA:  Initial treatment strategy for pancreas neoplasm. EXAM: NUCLEAR MEDICINE PET SKULL BASE TO THIGH TECHNIQUE: 9.14 mCi F-18 FDG was injected intravenously. Full-ring PET imaging was performed from the skull base to thigh after the radiotracer. CT data was obtained and used for attenuation correction and anatomic localization. Fasting blood glucose: 157 mg/dl COMPARISON:  MRI 07/01/2018 FINDINGS: Mediastinal blood pool activity: SUV max 3.72 Liver activity: SUV max NA NECK: No hypermetabolic lymph nodes in the neck. Incidental CT findings: none CHEST: No hypermetabolic mediastinal or hilar nodes. No suspicious pulmonary nodules on the CT scan. Incidental CT findings: Aortic atherosclerosis. ABDOMEN/PELVIS: There are innumerable low-attenuation lesions involving both lobes  of liver most of which are all new from 06/30/2018 and MRI from 07/01/2018. On the corresponding PET images there are multifocal abnormal areas of increased radiotracer uptake reflecting FDG avid metastatic disease. Index lesion within the lateral segment of left lobe of liver measures 2.2 cm within SUV max of 9.81. Index lesion within segment 4 measures 2.1 cm and has SUV max of 7.88. Dominant lesion within segment 5 measures 3.2 cm with SUV max of 15.12. The common bile duct stent remains in situ and there is pneumobilia reflecting biliary patency. Intensely hypermetabolic head of pancreas mass measures 3.5 cm and has SUV max 16.78. No abnormal activity within the spleen or adrenal glands. Hypermetabolic porta hepatic node measures 1.5 cm with SUV max of 16.78. No pelvic or inguinal FDG avid lymph nodes. No ascites or peritoneal nodularity identified. Incidental CT findings: Status post gastric banding. SKELETON: No focal hypermetabolic activity to  suggest skeletal metastasis. Incidental CT findings: none IMPRESSION: 1. Mass in head of pancreas is intensely hypermetabolic compatible with primary pancreatic adenocarcinoma. 2. Enlarged and FDG avid porta hepatic lymph node compatible with metastatic adenopathy. 3. Extensive FDG avid liver metastases involve both lobes of liver and exhibit marked progression when compared with MRI from 07/01/2018. 4.  Aortic Atherosclerosis (ICD10-I70.0). Electronically Signed   By: Kerby Moors M.D.   On: 07/21/2018 09:34   Ir Imaging Guided Port Insertion  Result Date: 07/20/2018 CLINICAL DATA:  Pancreas carcinoma EXAM: RIGHT INTERNAL JUGULAR SINGLE LUMEN POWER PORT CATHETER INSERTION Date:  07/20/2018 07/20/2018 1:33 pm Radiologist:  Jerilynn Mages. Daryll Brod, MD Guidance:  Ultrasound fluoroscopic MEDICATIONS: Ancef 2 g; The antibiotic was administered within an appropriate time interval prior to skin puncture. ANESTHESIA/SEDATION: Versed 4.0 mg IV; Fentanyl 100 mcg IV; Moderate Sedation Time:  27 minutes The patient was continuously monitored during the procedure by the interventional radiology nurse under my direct supervision. FLUOROSCOPY TIME:  One minutes, 12 seconds (13 mGy) COMPLICATIONS: None immediate. CONTRAST:  None. PROCEDURE: Informed consent was obtained from the patient following explanation of the procedure, risks, benefits and alternatives. The patient understands, agrees and consents for the procedure. All questions were addressed. A time out was performed. Maximal barrier sterile technique utilized including caps, mask, sterile gowns, sterile gloves, large sterile drape, hand hygiene, and 2% chlorhexidine scrub. Under sterile conditions and local anesthesia, right internal jugular micropuncture venous access was performed. Access was performed with ultrasound. Images were obtained for documentation of the patent right internal jugular vein. A guide wire was inserted followed by a transitional dilator. This allowed  insertion of a guide wire and catheter into the IVC. Measurements were obtained from the SVC / RA junction back to the right IJ venotomy site. In the right infraclavicular chest, a subcutaneous pocket was created over the second anterior rib. This was done under sterile conditions and local anesthesia. 1% lidocaine with epinephrine was utilized for this. A 2.5 cm incision was made in the skin. Blunt dissection was performed to create a subcutaneous pocket over the right pectoralis major muscle. The pocket was flushed with saline vigorously. There was adequate hemostasis. The port catheter was assembled and checked for leakage. The port catheter was secured in the pocket with two retention sutures. The tubing was tunneled subcutaneously to the right venotomy site and inserted into the SVC/RA junction through a valved peel-away sheath. Position was confirmed with fluoroscopy. Images were obtained for documentation. The patient tolerated the procedure well. No immediate complications. Incisions were closed in a two layer fashion  with 4 - 0 Vicryl suture. Dermabond was applied to the skin. The port catheter was accessed, blood was aspirated followed by saline and heparin flushes. Needle was removed. A dry sterile dressing was applied. IMPRESSION: Ultrasound and fluoroscopically guided right internal jugular single lumen power port catheter insertion. Tip in the SVC/RA junction. Catheter ready for use. Electronically Signed   By: Jerilynn Mages.  Shick M.D.   On: 07/20/2018 13:51

## 2018-08-08 ENCOUNTER — Inpatient Hospital Stay: Payer: Medicare Other

## 2018-08-08 ENCOUNTER — Inpatient Hospital Stay (HOSPITAL_BASED_OUTPATIENT_CLINIC_OR_DEPARTMENT_OTHER): Payer: Medicare Other | Admitting: Medical

## 2018-08-08 ENCOUNTER — Telehealth: Payer: Self-pay | Admitting: Nurse Practitioner

## 2018-08-08 ENCOUNTER — Encounter: Payer: Self-pay | Admitting: Nurse Practitioner

## 2018-08-08 ENCOUNTER — Encounter: Payer: Self-pay | Admitting: Adult Health

## 2018-08-08 ENCOUNTER — Inpatient Hospital Stay (HOSPITAL_BASED_OUTPATIENT_CLINIC_OR_DEPARTMENT_OTHER): Payer: Medicare Other | Admitting: Nurse Practitioner

## 2018-08-08 ENCOUNTER — Other Ambulatory Visit: Payer: Self-pay

## 2018-08-08 VITALS — BP 140/73 | HR 77 | Temp 98.7°F | Resp 18 | Wt 178.7 lb

## 2018-08-08 VITALS — BP 152/65 | HR 79 | Resp 20

## 2018-08-08 DIAGNOSIS — K1231 Oral mucositis (ulcerative) due to antineoplastic therapy: Secondary | ICD-10-CM

## 2018-08-08 DIAGNOSIS — Z170 Estrogen receptor positive status [ER+]: Secondary | ICD-10-CM

## 2018-08-08 DIAGNOSIS — Z9884 Bariatric surgery status: Secondary | ICD-10-CM | POA: Diagnosis not present

## 2018-08-08 DIAGNOSIS — Z5111 Encounter for antineoplastic chemotherapy: Secondary | ICD-10-CM

## 2018-08-08 DIAGNOSIS — T8090XA Unspecified complication following infusion and therapeutic injection, initial encounter: Secondary | ICD-10-CM

## 2018-08-08 DIAGNOSIS — N182 Chronic kidney disease, stage 2 (mild): Secondary | ICD-10-CM | POA: Diagnosis not present

## 2018-08-08 DIAGNOSIS — M546 Pain in thoracic spine: Secondary | ICD-10-CM | POA: Diagnosis not present

## 2018-08-08 DIAGNOSIS — K831 Obstruction of bile duct: Secondary | ICD-10-CM

## 2018-08-08 DIAGNOSIS — I129 Hypertensive chronic kidney disease with stage 1 through stage 4 chronic kidney disease, or unspecified chronic kidney disease: Secondary | ICD-10-CM | POA: Diagnosis not present

## 2018-08-08 DIAGNOSIS — K449 Diaphragmatic hernia without obstruction or gangrene: Secondary | ICD-10-CM | POA: Diagnosis not present

## 2018-08-08 DIAGNOSIS — E1122 Type 2 diabetes mellitus with diabetic chronic kidney disease: Secondary | ICD-10-CM | POA: Diagnosis not present

## 2018-08-08 DIAGNOSIS — C250 Malignant neoplasm of head of pancreas: Secondary | ICD-10-CM

## 2018-08-08 DIAGNOSIS — Z79899 Other long term (current) drug therapy: Secondary | ICD-10-CM

## 2018-08-08 DIAGNOSIS — E785 Hyperlipidemia, unspecified: Secondary | ICD-10-CM | POA: Diagnosis not present

## 2018-08-08 DIAGNOSIS — C787 Secondary malignant neoplasm of liver and intrahepatic bile duct: Secondary | ICD-10-CM | POA: Diagnosis not present

## 2018-08-08 DIAGNOSIS — R634 Abnormal weight loss: Secondary | ICD-10-CM

## 2018-08-08 DIAGNOSIS — K219 Gastro-esophageal reflux disease without esophagitis: Secondary | ICD-10-CM

## 2018-08-08 DIAGNOSIS — F419 Anxiety disorder, unspecified: Secondary | ICD-10-CM

## 2018-08-08 DIAGNOSIS — K1230 Oral mucositis (ulcerative), unspecified: Secondary | ICD-10-CM | POA: Diagnosis not present

## 2018-08-08 DIAGNOSIS — E282 Polycystic ovarian syndrome: Secondary | ICD-10-CM

## 2018-08-08 DIAGNOSIS — C50411 Malignant neoplasm of upper-outer quadrant of right female breast: Secondary | ICD-10-CM | POA: Diagnosis not present

## 2018-08-08 DIAGNOSIS — Z79811 Long term (current) use of aromatase inhibitors: Secondary | ICD-10-CM | POA: Diagnosis not present

## 2018-08-08 DIAGNOSIS — R630 Anorexia: Secondary | ICD-10-CM

## 2018-08-08 DIAGNOSIS — Z803 Family history of malignant neoplasm of breast: Secondary | ICD-10-CM | POA: Diagnosis not present

## 2018-08-08 DIAGNOSIS — G4700 Insomnia, unspecified: Secondary | ICD-10-CM

## 2018-08-08 LAB — CBC WITH DIFFERENTIAL (CANCER CENTER ONLY)
Abs Immature Granulocytes: 2.19 10*3/uL — ABNORMAL HIGH (ref 0.00–0.07)
Basophils Absolute: 0.1 10*3/uL (ref 0.0–0.1)
Basophils Relative: 1 %
Eosinophils Absolute: 0.1 10*3/uL (ref 0.0–0.5)
Eosinophils Relative: 1 %
HCT: 34.1 % — ABNORMAL LOW (ref 36.0–46.0)
Hemoglobin: 11.4 g/dL — ABNORMAL LOW (ref 12.0–15.0)
Immature Granulocytes: 23 %
Lymphocytes Relative: 20 %
Lymphs Abs: 1.9 10*3/uL (ref 0.7–4.0)
MCH: 31.3 pg (ref 26.0–34.0)
MCHC: 33.4 g/dL (ref 30.0–36.0)
MCV: 93.7 fL (ref 80.0–100.0)
Monocytes Absolute: 0.9 10*3/uL (ref 0.1–1.0)
Monocytes Relative: 10 %
Neutro Abs: 4.4 10*3/uL (ref 1.7–7.7)
Neutrophils Relative %: 45 %
Platelet Count: 221 10*3/uL (ref 150–400)
RBC: 3.64 MIL/uL — ABNORMAL LOW (ref 3.87–5.11)
RDW: 13.2 % (ref 11.5–15.5)
WBC Count: 9.6 10*3/uL (ref 4.0–10.5)
nRBC: 1.5 % — ABNORMAL HIGH (ref 0.0–0.2)

## 2018-08-08 LAB — CMP (CANCER CENTER ONLY)
ALT: 13 U/L (ref 0–44)
AST: 18 U/L (ref 15–41)
Albumin: 3.3 g/dL — ABNORMAL LOW (ref 3.5–5.0)
Alkaline Phosphatase: 102 U/L (ref 38–126)
Anion gap: 9 (ref 5–15)
BUN: 11 mg/dL (ref 8–23)
CO2: 27 mmol/L (ref 22–32)
Calcium: 8.7 mg/dL — ABNORMAL LOW (ref 8.9–10.3)
Chloride: 103 mmol/L (ref 98–111)
Creatinine: 0.77 mg/dL (ref 0.44–1.00)
GFR, Est AFR Am: >60 mL/min (ref >60)
GFR, Estimated: >60 mL/min (ref >60)
Glucose, Bld: 123 mg/dL — ABNORMAL HIGH (ref 70–99)
Potassium: 3.3 mmol/L — ABNORMAL LOW (ref 3.5–5.1)
Sodium: 139 mmol/L (ref 135–145)
Total Bilirubin: 0.4 mg/dL (ref 0.3–1.2)
Total Protein: 6 g/dL — ABNORMAL LOW (ref 6.5–8.1)

## 2018-08-08 MED ORDER — DEXTROSE 5 % IV SOLN
Freq: Once | INTRAVENOUS | Status: AC
  Administered 2018-08-08: 10:00:00 via INTRAVENOUS
  Filled 2018-08-08: qty 250

## 2018-08-08 MED ORDER — SODIUM CHLORIDE 0.9 % IV SOLN
Freq: Once | INTRAVENOUS | Status: AC
  Administered 2018-08-08: 11:00:00 via INTRAVENOUS
  Filled 2018-08-08: qty 5

## 2018-08-08 MED ORDER — HEPARIN SOD (PORK) LOCK FLUSH 100 UNIT/ML IV SOLN
500.0000 [IU] | Freq: Once | INTRAVENOUS | Status: DC | PRN
  Filled 2018-08-08: qty 5

## 2018-08-08 MED ORDER — ATROPINE SULFATE 1 MG/ML IJ SOLN
INTRAMUSCULAR | Status: AC
  Filled 2018-08-08: qty 1

## 2018-08-08 MED ORDER — OXALIPLATIN CHEMO INJECTION 100 MG/20ML
85.0000 mg/m2 | Freq: Once | INTRAVENOUS | Status: AC
  Administered 2018-08-08: 165 mg via INTRAVENOUS
  Filled 2018-08-08: qty 33

## 2018-08-08 MED ORDER — PALONOSETRON HCL INJECTION 0.25 MG/5ML
INTRAVENOUS | Status: AC
  Filled 2018-08-08: qty 5

## 2018-08-08 MED ORDER — LEUCOVORIN CALCIUM INJECTION 350 MG
400.0000 mg/m2 | Freq: Once | INTRAVENOUS | Status: AC
  Administered 2018-08-08: 772 mg via INTRAVENOUS
  Filled 2018-08-08: qty 38.6

## 2018-08-08 MED ORDER — SODIUM CHLORIDE 0.9% FLUSH
10.0000 mL | INTRAVENOUS | Status: DC | PRN
  Filled 2018-08-08: qty 10

## 2018-08-08 MED ORDER — ATROPINE SULFATE 1 MG/ML IJ SOLN: 0.5000 mg | Freq: Once | INTRAMUSCULAR | Status: DC | PRN

## 2018-08-08 MED ORDER — TRAMADOL HCL 50 MG PO TABS: 50.0000 mg | ORAL_TABLET | Freq: Four times a day (QID) | ORAL | 0 refills | Status: DC | PRN

## 2018-08-08 MED ORDER — DIPHENHYDRAMINE HCL 50 MG/ML IJ SOLN
25.0000 mg | Freq: Once | INTRAMUSCULAR | Status: AC
  Administered 2018-08-08: 25 mg via INTRAVENOUS

## 2018-08-08 MED ORDER — ATROPINE SULFATE 1 MG/ML IJ SOLN
0.4000 mg | Freq: Once | INTRAMUSCULAR | Status: AC
  Administered 2018-08-08: 0.4 mg via INTRAVENOUS

## 2018-08-08 MED ORDER — FAMOTIDINE IN NACL 20-0.9 MG/50ML-% IV SOLN
20.0000 mg | Freq: Once | INTRAVENOUS | Status: AC
  Administered 2018-08-08: 20 mg via INTRAVENOUS

## 2018-08-08 MED ORDER — PALONOSETRON HCL INJECTION 0.25 MG/5ML
0.2500 mg | Freq: Once | INTRAVENOUS | Status: AC
  Administered 2018-08-08: 0.25 mg via INTRAVENOUS

## 2018-08-08 MED ORDER — SODIUM CHLORIDE 0.9 % IV SOLN
2400.0000 mg/m2 | INTRAVENOUS | Status: DC
  Administered 2018-08-08: 4650 mg via INTRAVENOUS
  Filled 2018-08-08: qty 93

## 2018-08-08 MED ORDER — IRINOTECAN HCL CHEMO INJECTION 100 MG/5ML
150.0000 mg/m2 | Freq: Once | INTRAVENOUS | Status: AC
  Administered 2018-08-08: 280 mg via INTRAVENOUS
  Filled 2018-08-08: qty 14

## 2018-08-08 NOTE — Progress Notes (Signed)
At 1450 during irinotecan/leucovorin infusion, post oxaliplatin infusion completed, pt C/O numbness in her fingertips bilaterally, has a tickle in her throat, feels very hoarse, feels like she can't move her tongue.  Hypersensitivity protocol initiated,  Irinotecan/leucovorin infusion stopped, NS bolus started. Sandi Mealy PA called to infusion room.  Pepcid & Benadryl given as documented on MAR, VS documented on flow sheet.  Per Sandi Mealy PA OK to restart irinotecan/leucovorin infusion @ 1520.  Pt reports @ 1512 her hands are better, back to baseline, feels like she can swallow better, voice is better, but "still kind of scratchy."  Pt handed off, report given to Tollie Pizza., RN @ 1600.  Pt in stable condition.

## 2018-08-08 NOTE — Patient Instructions (Signed)
[facility] Discharge Instructions for Patients Receiving Chemotherapy  Today you received the following chemotherapy agents:  Oxaliplatin, Irinotecan, Leucovorin, and 5FU.  To help prevent nausea and vomiting after your treatment, we encourage you to take your nausea medication as directed.   If you develop nausea and vomiting that is not controlled by your nausea medication, call the clinic.   BELOW ARE SYMPTOMS THAT SHOULD BE REPORTED IMMEDIATELY:  *FEVER GREATER THAN 100.5 F  *CHILLS WITH OR WITHOUT FEVER  NAUSEA AND VOMITING THAT IS NOT CONTROLLED WITH YOUR NAUSEA MEDICATION  *UNUSUAL SHORTNESS OF BREATH  *UNUSUAL BRUISING OR BLEEDING  TENDERNESS IN MOUTH AND THROAT WITH OR WITHOUT PRESENCE OF ULCERS  *URINARY PROBLEMS  *BOWEL PROBLEMS  UNUSUAL RASH Items with * indicate a potential emergency and should be followed up as soon as possible.  Feel free to call the clinic should you have any questions or concerns. The clinic phone number is [phone].  Please show the [facility] at check-in to the Emergency Department and triage nurse.  Oxaliplatin Injection What is this medicine? OXALIPLATIN (ox AL i PLA tin) is a chemotherapy drug. It targets fast dividing cells, like cancer cells, and causes these cells to die. This medicine is used to treat cancers of the colon and rectum, and many other cancers. This medicine may be used for other purposes; ask your health care provider or pharmacist if you have questions. COMMON BRAND NAME(S): Eloxatin What should I tell my health care provider before I take this medicine? They need to know if you have any of these conditions:  kidney disease  an unusual or allergic reaction to oxaliplatin, other chemotherapy, other medicines, foods, dyes, or preservatives  pregnant or trying to get pregnant  breast-feeding How should I use this medicine? This drug is given as an infusion into a vein. It is  administered in a hospital or clinic by a specially trained health care professional. Talk to your pediatrician regarding the use of this medicine in children. Special care may be needed. Overdosage: If you think you have taken too much of this medicine contact a poison control center or emergency room at once. NOTE: This medicine is only for you. Do not share this medicine with others. What if I miss a dose? It is important not to miss a dose. Call your doctor or health care professional if you are unable to keep an appointment. What may interact with this medicine?  medicines to increase blood counts like filgrastim, pegfilgrastim, sargramostim  probenecid  some antibiotics like amikacin, gentamicin, neomycin, polymyxin B, streptomycin, tobramycin  zalcitabine Talk to your doctor or health care professional before taking any of these medicines:  acetaminophen  aspirin  ibuprofen  ketoprofen  naproxen This list may not describe all possible interactions. Give your health care provider a list of all the medicines, herbs, non-prescription drugs, or dietary supplements you use. Also tell them if you smoke, drink alcohol, or use illegal drugs. Some items may interact with your medicine. What should I watch for while using this medicine? Your condition will be monitored carefully while you are receiving this medicine. You will need important blood work done while you are taking this medicine. This medicine can make you more sensitive to cold. Do not drink cold drinks or use ice. Cover exposed skin before coming in contact with cold temperatures or cold objects. When out in cold weather wear warm clothing and cover your mouth and nose to warm the air  that goes into your lungs. Tell your doctor if you get sensitive to the cold. This drug may make you feel generally unwell. This is not uncommon, as chemotherapy can affect healthy cells as well as cancer cells. Report any side effects. Continue  your course of treatment even though you feel ill unless your doctor tells you to stop. In some cases, you may be given additional medicines to help with side effects. Follow all directions for their use. Call your doctor or health care professional for advice if you get a fever, chills or sore throat, or other symptoms of a cold or flu. Do not treat yourself. This drug decreases your body's ability to fight infections. Try to avoid being around people who are sick. This medicine may increase your risk to bruise or bleed. Call your doctor or health care professional if you notice any unusual bleeding. Be careful brushing and flossing your teeth or using a toothpick because you may get an infection or bleed more easily. If you have any dental work done, tell your dentist you are receiving this medicine. Avoid taking products that contain aspirin, acetaminophen, ibuprofen, naproxen, or ketoprofen unless instructed by your doctor. These medicines may hide a fever. Do not become pregnant while taking this medicine. Women should inform their doctor if they wish to become pregnant or think they might be pregnant. There is a potential for serious side effects to an unborn child. Talk to your health care professional or pharmacist for more information. Do not breast-feed an infant while taking this medicine. Call your doctor or health care professional if you get diarrhea. Do not treat yourself. What side effects may I notice from receiving this medicine? Side effects that you should report to your doctor or health care professional as soon as possible:  allergic reactions like skin rash, itching or hives, swelling of the face, lips, or tongue  low blood counts - This drug may decrease the number of white blood cells, red blood cells and platelets. You may be at increased risk for infections and bleeding.  signs of infection - fever or chills, cough, sore throat, pain or difficulty passing urine  signs of  decreased platelets or bleeding - bruising, pinpoint red spots on the skin, black, tarry stools, nosebleeds  signs of decreased red blood cells - unusually weak or tired, fainting spells, lightheadedness  breathing problems  chest pain, pressure  cough  diarrhea  jaw tightness  mouth sores  nausea and vomiting  pain, swelling, redness or irritation at the injection site  pain, tingling, numbness in the hands or feet  problems with balance, talking, walking  redness, blistering, peeling or loosening of the skin, including inside the mouth  trouble passing urine or change in the amount of urine Side effects that usually do not require medical attention (report to your doctor or health care professional if they continue or are bothersome):  changes in vision  constipation  hair loss  loss of appetite  metallic taste in the mouth or changes in taste  stomach pain This list may not describe all possible side effects. Call your doctor for medical advice about side effects. You may report side effects to FDA at 1-800-FDA-1088. Where should I keep my medicine? This drug is given in a hospital or clinic and will not be stored at home. NOTE: This sheet is a summary. It may not cover all possible information. If you have questions about this medicine, talk to your doctor, pharmacist, or health  care provider.  2020 [employer]/Gold Standard (2007-07-26 17:22:47)  Irinotecan injection What is this medicine? IRINOTECAN (ir in oh TEE kan ) is a chemotherapy drug. It is used to treat colon and rectal cancer. This medicine may be used for other purposes; ask your health care provider or pharmacist if you have questions. COMMON BRAND NAME(S): Camptosar What should I tell my health care provider before I take this medicine? They need to know if you have any of these conditions:  dehydration  diarrhea  infection (especially a virus infection such as chickenpox, cold sores, or  herpes)  liver disease  low blood counts, like low white cell, platelet, or red cell counts  low levels of calcium, magnesium, or potassium in the blood  recent or ongoing radiation therapy  an unusual or allergic reaction to irinotecan, other medicines, foods, dyes, or preservatives  pregnant or trying to get pregnant  breast-feeding How should I use this medicine? This drug is given as an infusion into a vein. It is administered in a hospital or clinic by a specially trained health care professional. Talk to your pediatrician regarding the use of this medicine in children. Special care may be needed. Overdosage: If you think you have taken too much of this medicine contact a poison control center or emergency room at once. NOTE: This medicine is only for you. Do not share this medicine with others. What if I miss a dose? It is important not to miss your dose. Call your doctor or health care professional if you are unable to keep an appointment. What may interact with this medicine? This medicine may interact with the following medications:  antiviral medicines for HIV or AIDS  certain antibiotics like rifampin or rifabutin  certain medicines for fungal infections like itraconazole, ketoconazole, posaconazole, and voriconazole  certain medicines for seizures like carbamazepine, phenobarbital, phenotoin  clarithromycin  gemfibrozil  nefazodone  St. John's Wort This list may not describe all possible interactions. Give your health care provider a list of all the medicines, herbs, non-prescription drugs, or dietary supplements you use. Also tell them if you smoke, drink alcohol, or use illegal drugs. Some items may interact with your medicine. What should I watch for while using this medicine? Your condition will be monitored carefully while you are receiving this medicine. You will need important blood work done while you are taking this medicine. This drug may make you feel  generally unwell. This is not uncommon, as chemotherapy can affect healthy cells as well as cancer cells. Report any side effects. Continue your course of treatment even though you feel ill unless your doctor tells you to stop. In some cases, you may be given additional medicines to help with side effects. Follow all directions for their use. You may get drowsy or dizzy. Do not drive, use machinery, or do anything that needs mental alertness until you know how this medicine affects you. Do not stand or sit up quickly, especially if you are an older patient. This reduces the risk of dizzy or fainting spells. Call your health care professional for advice if you get a fever, chills, or sore throat, or other symptoms of a cold or flu. Do not treat yourself. This medicine decreases your body's ability to fight infections. Try to avoid being around people who are sick. Avoid taking products that contain aspirin, acetaminophen, ibuprofen, naproxen, or ketoprofen unless instructed by your doctor. These medicines may hide a fever. This medicine may increase your risk to bruise or bleed.  Call your doctor or health care professional if you notice any unusual bleeding. Be careful brushing and flossing your teeth or using a toothpick because you may get an infection or bleed more easily. If you have any dental work done, tell your dentist you are receiving this medicine. Do not become pregnant while taking this medicine or for 6 months after stopping it. Women should inform their health care professional if they wish to become pregnant or think they might be pregnant. Men should not father a child while taking this medicine and for 3 months after stopping it. There is potential for serious side effects to an unborn child. Talk to your health care professional for more information. Do not breast-feed an infant while taking this medicine or for 7 days after stopping it. This medicine has caused ovarian failure in some  women. This medicine may make it more difficult to get pregnant. Talk to your health care professional if you are concerned about your fertility. This medicine has caused decreased sperm counts in some men. This may make it more difficult to father a child. Talk to your health care professional if you are concerned about your fertility. What side effects may I notice from receiving this medicine? Side effects that you should report to your doctor or health care professional as soon as possible:  allergic reactions like skin rash, itching or hives, swelling of the face, lips, or tongue  chest pain  diarrhea  flushing, runny nose, sweating during infusion  low blood counts - this medicine may decrease the number of white blood cells, red blood cells and platelets. You may be at increased risk for infections and bleeding.  nausea, vomiting  pain, swelling, warmth in the leg  signs of decreased platelets or bleeding - bruising, pinpoint red spots on the skin, black, tarry stools, blood in the urine  signs of infection - fever or chills, cough, sore throat, pain or difficulty passing urine  signs of decreased red blood cells - unusually weak or tired, fainting spells, lightheadedness Side effects that usually do not require medical attention (report to your doctor or health care professional if they continue or are bothersome):  constipation  hair loss  headache  loss of appetite  mouth sores  stomach pain This list may not describe all possible side effects. Call your doctor for medical advice about side effects. You may report side effects to FDA at 1-800-FDA-1088. Where should I keep my medicine? This drug is given in a hospital or clinic and will not be stored at home. NOTE: This sheet is a summary. It may not cover all possible information. If you have questions about this medicine, talk to your doctor, pharmacist, or health care provider.  2020 [employer]vier/Gold Standard  (2018-02-18 10:09:17)  Leucovorin injection What is this medicine? LEUCOVORIN (loo koe VOR in) is used to prevent or treat the harmful effects of some medicines. This medicine is used to treat anemia caused by a low amount of folic acid in the body. It is also used with 5-fluorouracil (5-FU) to treat colon cancer. This medicine may be used for other purposes; ask your health care provider or pharmacist if you have questions. What should I tell my health care provider before I take this medicine? They need to know if you have any of these conditions:  anemia from low levels of vitamin B-12 in the blood  an unusual or allergic reaction to leucovorin, folic acid, other medicines, foods, dyes, or preservatives  pregnant  or trying to get pregnant  breast-feeding How should I use this medicine? This medicine is for injection into a muscle or into a vein. It is given by a health care professional in a hospital or clinic setting. Talk to your pediatrician regarding the use of this medicine in children. Special care may be needed. Overdosage: If you think you have taken too much of this medicine contact a poison control center or emergency room at once. NOTE: This medicine is only for you. Do not share this medicine with others. What if I miss a dose? This does not apply. What may interact with this medicine?  capecitabine  fluorouracil  phenobarbital  phenytoin  primidone  trimethoprim-sulfamethoxazole This list may not describe all possible interactions. Give your health care provider a list of all the medicines, herbs, non-prescription drugs, or dietary supplements you use. Also tell them if you smoke, drink alcohol, or use illegal drugs. Some items may interact with your medicine. What should I watch for while using this medicine? Your condition will be monitored carefully while you are receiving this medicine. This medicine may increase the side effects of 5-fluorouracil, 5-FU.  Tell your doctor or health care professional if you have diarrhea or mouth sores that do not get better or that get worse. What side effects may I notice from receiving this medicine? Side effects that you should report to your doctor or health care professional as soon as possible:  allergic reactions like skin rash, itching or hives, swelling of the face, lips, or tongue  breathing problems  fever, infection  mouth sores  unusual bleeding or bruising  unusually weak or tired Side effects that usually do not require medical attention (report to your doctor or health care professional if they continue or are bothersome):  constipation or diarrhea  loss of appetite  nausea, vomiting This list may not describe all possible side effects. Call your doctor for medical advice about side effects. You may report side effects to FDA at 1-800-FDA-1088. Where should I keep my medicine? This drug is given in a hospital or clinic and will not be stored at home. NOTE: This sheet is a summary. It may not cover all possible information. If you have questions about this medicine, talk to your doctor, pharmacist, or health care provider.  2020 [employer]/Gold Standard (2007-07-05 16:50:29)  Fluorouracil, 5-FU injection What is this medicine? FLUOROURACIL, 5-FU (flure oh YOOR a sil) is a chemotherapy drug. It slows the growth of cancer cells. This medicine is used to treat many types of cancer like breast cancer, colon or rectal cancer, pancreatic cancer, and stomach cancer. This medicine may be used for other purposes; ask your health care provider or pharmacist if you have questions. COMMON BRAND NAME(S): Adrucil What should I tell my health care provider before I take this medicine? They need to know if you have any of these conditions:  blood disorders  dihydropyrimidine dehydrogenase (DPD) deficiency  infection (especially a virus infection such as chickenpox, cold sores, or  herpes)  kidney disease  liver disease  malnourished, poor nutrition  recent or ongoing radiation therapy  an unusual or allergic reaction to fluorouracil, other chemotherapy, other medicines, foods, dyes, or preservatives  pregnant or trying to get pregnant  breast-feeding How should I use this medicine? This drug is given as an infusion or injection into a vein. It is administered in a hospital or clinic by a specially trained health care professional. Talk to your pediatrician regarding the use of  this medicine in children. Special care may be needed. Overdosage: If you think you have taken too much of this medicine contact a poison control center or emergency room at once. NOTE: This medicine is only for you. Do not share this medicine with others. What if I miss a dose? It is important not to miss your dose. Call your doctor or health care professional if you are unable to keep an appointment. What may interact with this medicine?  allopurinol  cimetidine  dapsone  digoxin  hydroxyurea  leucovorin  levamisole  medicines for seizures like ethotoin, fosphenytoin, phenytoin  medicines to increase blood counts like filgrastim, pegfilgrastim, sargramostim  medicines that treat or prevent blood clots like warfarin, enoxaparin, and dalteparin  methotrexate  metronidazole  pyrimethamine  some other chemotherapy drugs like busulfan, cisplatin, estramustine, vinblastine  trimethoprim  trimetrexate  vaccines Talk to your doctor or health care professional before taking any of these medicines:  acetaminophen  aspirin  ibuprofen  ketoprofen  naproxen This list may not describe all possible interactions. Give your health care provider a list of all the medicines, herbs, non-prescription drugs, or dietary supplements you use. Also tell them if you smoke, drink alcohol, or use illegal drugs. Some items may interact with your medicine. What should I watch for  while using this medicine? Visit your doctor for checks on your progress. This drug may make you feel generally unwell. This is not uncommon, as chemotherapy can affect healthy cells as well as cancer cells. Report any side effects. Continue your course of treatment even though you feel ill unless your doctor tells you to stop. In some cases, you may be given additional medicines to help with side effects. Follow all directions for their use. Call your doctor or health care professional for advice if you get a fever, chills or sore throat, or other symptoms of a cold or flu. Do not treat yourself. This drug decreases your body's ability to fight infections. Try to avoid being around people who are sick. This medicine may increase your risk to bruise or bleed. Call your doctor or health care professional if you notice any unusual bleeding. Be careful brushing and flossing your teeth or using a toothpick because you may get an infection or bleed more easily. If you have any dental work done, tell your dentist you are receiving this medicine. Avoid taking products that contain aspirin, acetaminophen, ibuprofen, naproxen, or ketoprofen unless instructed by your doctor. These medicines may hide a fever. Do not become pregnant while taking this medicine. Women should inform their doctor if they wish to become pregnant or think they might be pregnant. There is a potential for serious side effects to an unborn child. Talk to your health care professional or pharmacist for more information. Do not breast-feed an infant while taking this medicine. Men should inform their doctor if they wish to father a child. This medicine may lower sperm counts. Do not treat diarrhea with over the counter products. Contact your doctor if you have diarrhea that lasts more than 2 days or if it is severe and watery. This medicine can make you more sensitive to the sun. Keep out of the sun. If you cannot avoid being in the sun, wear  protective clothing and use sunscreen. Do not use sun lamps or tanning beds/booths. What side effects may I notice from receiving this medicine? Side effects that you should report to your doctor or health care professional as soon as possible:  allergic reactions  like skin rash, itching or hives, swelling of the face, lips, or tongue  low blood counts - this medicine may decrease the number of white blood cells, red blood cells and platelets. You may be at increased risk for infections and bleeding.  signs of infection - fever or chills, cough, sore throat, pain or difficulty passing urine  signs of decreased platelets or bleeding - bruising, pinpoint red spots on the skin, black, tarry stools, blood in the urine  signs of decreased red blood cells - unusually weak or tired, fainting spells, lightheadedness  breathing problems  changes in vision  chest pain  mouth sores  nausea and vomiting  pain, swelling, redness at site where injected  pain, tingling, numbness in the hands or feet  redness, swelling, or sores on hands or feet  stomach pain  unusual bleeding Side effects that usually do not require medical attention (report to your doctor or health care professional if they continue or are bothersome):  changes in finger or toe nails  diarrhea  dry or itchy skin  hair loss  headache  loss of appetite  sensitivity of eyes to the light  stomach upset  unusually teary eyes This list may not describe all possible side effects. Call your doctor for medical advice about side effects. You may report side effects to FDA at 1-800-FDA-1088. Where should I keep my medicine? This drug is given in a hospital or clinic and will not be stored at home. NOTE: This sheet is a summary. It may not cover all possible information. If you have questions about this medicine, talk to your doctor, pharmacist, or health care provider.  2020 [employer]/Gold Standard (2007-05-04  13:53:16)

## 2018-08-08 NOTE — Progress Notes (Addendum)
[place]   [phone] [phone]) [phone]   Clinic Follow up Note   Patient Care Team: Unk Pinto, MD as PCP - General (Internal Medicine) Alphonsa Overall, MD as Consulting Physician (General Surgery) Magrinat, Virgie Dad, MD as Consulting Physician (Oncology) Thea Silversmith, MD as Consulting Physician (Radiation Oncology) Sylvan Cheese, NP as Nurse Practitioner (Hematology and Oncology) Mauri Pole, MD as Consulting Physician (Gastroenterology) Arna Snipe, RN as Oncology Nurse Navigator 08/08/2018  CHIEF COMPLAINT: f/u pancreatic cancer  SUMMARY OF ONCOLOGIC HISTORY: Oncology History  Malignant neoplasm of upper-outer quadrant of right breast in female, estrogen receptor positive ([place])  11/15/2014 Mammogram   Right breast: mass warranting further evaluation   11/27/2014 Breast US   0.4 x 0.4 x 0.6 cm irregular hypoechoic mass within the right breast 10 o'clock position 7 cm from the nipple. Additionally there is a 0.7 x 0.8 x 0.8 cm taller than wide irregular hypoechoic mass with posterior acoustic shadowing within the right breast.   11/28/2014 Initial Biopsy   Right breast core needle biopsies, 2 masses: both invasive ductal carcinoma, grade 1, ER+ (100%), PR+ (85% -larger), HER2/neu negative, Ki67 5-10%.  Smaller mass PR-   11/28/2014 Clinical Stage   Stage IA: T1b N0   12/28/2014 Definitive Surgery   Right lumpectomies/SLNB: invasive ductal carcinoma, grade 2, repeat HER2/neu negative, 0/2 LN   12/28/2014 Pathologic Stage   Stage IA: npT1c pN0   12/28/2014 Oncotype testing   RS 16 (10% ROR)   02/18/2015 - 04/03/2015 Radiation Therapy    Right breast / 45 Gray at 1.8 [place] Cables per fraction x 25 fractions; right breast boost / 16 Gray at [employer] per fraction x 8 fractions   05/13/2015 -  Anti-estrogen oral therapy   Anastrozole 1 mg daily.   05/30/2015 Survivorship   SCP mailed to patient in lieu of in person visit    08/01/2018 Genetic Testing   Negative genetic testing on the common hereditary cancer panel.  The Common Hereditary Gene Panel offered by [facility] includes sequencing and/or deletion duplication testing of the following 48 genes: APC, ATM, AXIN2, BARD1, BMPR1A, BRCA1, BRCA2, BRIP1, CDH1, CDK4, CDKN2A (p14ARF), CDKN2A (p16INK4a), CHEK2, CTNNA1, DICER1, EPCAM (Deletion/duplication testing only), GREM1 (promoter region deletion/duplication testing only), KIT, MEN1, MLH1, MSH2, MSH3, MSH6, MUTYH, NBN, NF1, NHTL1, PALB2, PDGFRA, PMS2, POLD1, POLE, PTEN, RAD50, RAD51C, RAD51D, RNF43, SDHB, SDHC, SDHD, SMAD4, SMARCA4. STK11, TP53, TSC1, TSC2, and VHL.  The following genes were evaluated for sequence changes only: SDHA and HOXB13 c.251G>A variant only. The report date is August 01, 2018.   Pancreatic cancer ([place])  06/30/2018 Imaging   CT AP WO Contrast 06/30/18  IMPRESSION: 1. New common bile duct dilatation and intrahepatic bile duct dilatation. New fullness of the pancreatic head which could represent rapidly developing obstructive neoplastic process or sequela of recent localized pancreatitis. Recommend further characterization with MRCP or ERCP. 2. No other acute or significant findings. No bowel obstruction or evidence of bowel wall inflammation. No free fluid or abscess collection. No free intraperitoneal air. 3. Additional chronic/incidental findings detailed above.   07/01/2018 Procedure   ERCP by Dr Fuller Plan on 07/01/18  IMPRESSION - A single severe segmental biliary stricture was found in the lower third of the common bile duct. The stricture was malignant appearing. Brushed. - The entire biliary tree proximal to the stricture was dilated, secondary to the stricture. - Prior cholecystectomy. - A biliary sphincterotomy was performed. - One plastic stent was placed into the common bile  duct.   07/01/2018 Pathology Results   Diagnosis 07/01/18 BILE DUCT BRUSHING (SPECIMEN 1 OF 1 COLLECTED 07/01/2018)  SUSPICIOUS FOR ADENOCARCINOMA. SEE COMMENT.   07/14/2018 Initial Diagnosis   Pancreatic cancer ([place])   07/14/2018 Procedure   EUS by Dr Ardis Hughs on 07/14/18 IMPRESSION PENDING    07/14/2018 Cancer Staging   Staging form: Exocrine Pancreas, AJCC 8th Edition - Clinical: Stage IB (cT2, cN0, cM0) - Signed by Truitt Merle, MD on 07/15/2018   07/21/2018 PET scan   IMPRESSION: 1. Mass in head of pancreas is intensely hypermetabolic compatible with primary pancreatic adenocarcinoma. 2. Enlarged and FDG avid porta hepatic lymph node compatible with metastatic adenopathy. 3. Extensive FDG avid liver metastases involve both lobes of liver and exhibit marked progression when compared with MRI from 07/01/2018. 4.  Aortic Atherosclerosis (ICD10-I70.0).   07/26/2018 -  Chemotherapy   FOLFIRINOX q2weeks starting 07/26/18     CURRENT THERAPY: FOLFIRINOX q2 weeks, starting 07/26/18   INTERVAL HISTORY: Mr. Westall returns for f/u and treatment as scheduled. She completed cycle 1 FOLFIRINOX on 07/26/18. She was seen last week for toxicity check and received IV fluids. Today she feels OK. She noted nausea and dry heaves on 3 occasions after chemo. She alternates zofran, compazine, and phenergan which is effective. No vomiting. Denies constipation or diarrhea. She had moderate fatigue that has only slightly improved. She is out of bed and active at home, gets outside. Does light housework when able. Her taste and appetite decreased, po intake was limited also by mucositis. She used magic mouthwash and lidocaine. Sores finally resolved a few days ago. Cold sensitivity lasted 1 day. She had moderate bone pain after Udenyca for 1 day when she forgot claritin, but resolved with addition of tylenol that day. Epigastric pain is controlled, takes tramadol morning and before bed. Denies fever, chills, cough, chest pain, dyspnea, leg edema.    MEDICAL HISTORY:  Past Medical History:  Diagnosis Date  . Allergy    SEASONAL  .  Anxiety   . At risk for difficult insertion of breathing tube   . Breast cancer of upper-outer quadrant of right female breast ([place]) 12/03/2014  . 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 Family history of breast cancer   . GERD (gastroesophageal reflux disease)    hx - resolved with lap band surgery  . H/O hiatal hernia    resolved with band surgery  . H/O laparoscopic adjustable gastric banding 2010  . History of kidney stones 02/2018  . Hyperlipidemia   . Hypertension   . Hypokalemia   . Insomnia    tx with zanax  . Obesity   . Pancreatic mass   . PCOS (polycystic ovarian syndrome)   . Personal history of radiation therapy   . 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 Type II or unspecified type diabetes mellitus without mention of complication, not stated as uncontrolled 12/14/2012   no meds, diet controlled  . Wears contact lenses   . Wears glasses     SURGICAL HISTORY: Past Surgical History:  Procedure Laterality Date  . BACK SURGERY     cervical  . BILIARY STENT PLACEMENT N/A 07/01/2018   Procedure: BILIARY STENT PLACEMENT;  Surgeon: Ladene Artist, MD;  Location: [facility];  Service: Endoscopy;  Laterality: N/A;  cbd brushing  . BREAST BIOPSY    . BREAST LUMPECTOMY Right 2016  . BREAST LUMPECTOMY WITH NEEDLE LOCALIZATION AND AXILLARY SENTINEL LYMPH NODE BX Right 12/28/2014  Procedure: RIGHT BREAST LUMPECTOMY WITH TWO (2) NEEDLE LOCALIZATION AND AXILLARY SENTINEL LYMPH NODE BX;  Surgeon: Alphonsa Overall, MD;  Location: [facility];  Service: General;  Laterality: Right;  . CESAREAN SECTION     x 1  . CHOLECYSTECTOMY    . COLONOSCOPY  09/29/2004   normal  . CYSTOSCOPY WITH RETROGRADE PYELOGRAM, URETEROSCOPY AND STENT PLACEMENT Right 03/24/2018   Procedure: CYSTOSCOPY WITH RETROGRADE PYELOGRAM, URETEROSCOPY AND STENT PLACEMENT;  Surgeon: Franchot Gallo, MD;  Location[facility];  Service: Urology;  Laterality: Right;  1  HR  . DIAGNOSTIC LAPAROSCOPY    . ERCP N/A 07/01/2018   Procedure: ENDOSCOPIC RETROGRADE CHOLANGIOPANCREATOGRAPHY (ERCP);  Surgeon: Ladene Artist, MD;  Location: [facility];  Service: Endoscopy;  Laterality: N/A;  . ESOPHAGOGASTRODUODENOSCOPY (EGD) WITH PROPOFOL N/A 07/14/2018   Procedure: ESOPHAGOGASTRODUODENOSCOPY (EGD) WITH PROPOFOL;  Surgeon: Milus Banister, MD;  Location: [facility];  Service: Endoscopy;  Laterality: N/A;  . EUS N/A 07/14/2018   Procedure: UPPER ENDOSCOPIC ULTRASOUND (EUS) RADIAL;  Surgeon: Milus Banister, MD;  Location: [facility];  Service: Endoscopy;  Laterality: N/A;  . FINE NEEDLE ASPIRATION  07/14/2018   Procedure: FINE NEEDLE ASPIRATION (FNA) RADIAL;  Surgeon: Milus Banister, MD;  Location: [facility];  Service: Endoscopy;;  . HERNIA REPAIR  2008  . IR IMAGING GUIDED PORT INSERTION  07/20/2018  . LAPAROSCOPIC APPENDECTOMY N/A 08/06/2012   Procedure: APPENDECTOMY LAPAROSCOPIC;  Surgeon: Earnstine Regal, MD;  Location: [facility];  Service: General;  Laterality: N/A;  . LAPAROSCOPIC GASTRIC BANDING WITH HIATAL HERNIA REPAIR N/A Nov 2008  . SPHINCTEROTOMY  07/01/2018   Procedure: SPHINCTEROTOMY;  Surgeon: Ladene Artist, MD;  Location: [facility];  Service: Endoscopy;;  . TUBAL LIGATION      I have reviewed the social history and family history with the patient and they are unchanged from previous note.  ALLERGIES:  is allergic to codeine; lipitor [atorvastatin]; and ace inhibitors.  MEDICATIONS:  Current Outpatient Medications  Medication Sig Dispense Refill  . acetaminophen (TYLENOL) 500 MG tablet Take 1,000 mg by mouth 2 (two) times a day.    . anastrozole (ARIMIDEX) 1 MG tablet TAKE 1 TABLET BY MOUTH EVERY DAY (Patient taking differently: Take 1 mg by mouth daily. ) 90 tablet 3  . aspirin EC 81 MG tablet Take 81 mg by mouth daily.    Marland Kitchen atenolol (TENORMIN) 100 MG tablet TAKE 1 TABLET BY MOUTH EVERY DAY (Patient taking differently: Take 50 mg by mouth  daily after supper. ) 90 tablet 3  . Cholecalciferol (VITAMIN D3) 5000 UNITS CAPS Take by mouth daily after supper.     . lidocaine (XYLOCAINE) 2 % solution Use as directed 5 mLs in the mouth or throat every 3 (three) hours as needed for mouth pain. 200 mL 1  . lidocaine-prilocaine (EMLA) cream Apply to affected area once 30 g 3  . magic mouthwash SOLN Take 5 mLs by mouth 4 (four) times daily as needed for mouth pain. 240 mL 1  . ondansetron (ZOFRAN) 8 MG tablet Take 1 tablet (8 mg total) by mouth 2 (two) times daily as needed. Start on day 3 after chemotherapy. 30 tablet 1  . prochlorperazine (COMPAZINE) 10 MG tablet Take 1 tablet (10 mg total) by mouth every 6 (six) hours as needed (Nausea or vomiting). 30 tablet 1  . promethazine (PHENERGAN) 25 MG tablet Take 1/2-1 tab every 6 hours as needed for nausea. 60 tablet 3  . rosuvastatin (  CRESTOR) 10 MG tablet     . traMADol (ULTRAM) 50 MG tablet Take 1 tablet (50 mg total) by mouth every 6 (six) hours as needed. 30 tablet 0  . traZODone (DESYREL) 150 MG tablet Take 1/2 to 1 tablet 1 to 2 hours before Bedtime (Patient taking differently: Take 150 mg by mouth. ) 90 tablet 3  . verapamil (CALAN) 80 MG tablet Take 1 tablet 2 x /day with meal for BP (Patient taking differently: Take 80 mg by mouth 2 (two) times daily with a meal. ) 180 tablet 3  . Zinc 50 MG TABS Take 50 mg by mouth daily after supper.     No current facility-administered medications for this visit.    Facility-Administered Medications Ordered in Other Visits  Medication Dose Route Frequency Provider Last Rate Last Dose  . atropine injection 0.4 mg  0.4 mg Intravenous Once Truitt Merle, MD      . fluorouracil (ADRUCIL) 4,650 mg in sodium chloride 0.9 % 57 mL chemo infusion  2,400 mg/m2 (Treatment Plan Recorded) Intravenous 1 day or 1 dose Truitt Merle, MD      . heparin lock flush 100 unit/mL  500 Units Intracatheter Once PRN Truitt Merle, MD      . irinotecan (CAMPTOSAR) 280 mg in dextrose 5 %  500 mL chemo infusion  150 mg/m2 (Treatment Plan Recorded) Intravenous Once Truitt Merle, MD      . leucovorin 772 mg in dextrose 5 % 250 mL infusion  400 mg/m2 (Treatment Plan Recorded) Intravenous Once Truitt Merle, MD      . oxaliplatin (ELOXATIN) 165 mg in dextrose 5 % 500 mL chemo infusion  85 mg/m2 (Treatment Plan Recorded) Intravenous Once Truitt Merle, MD 267 mL/hr at 08/08/18 1145 165 mg at 08/08/18 1145  . sodium chloride flush (NS) 0.9 % injection 10 mL  10 mL Intracatheter PRN Truitt Merle, MD        PHYSICAL EXAMINATION: ECOG PERFORMANCE STATUS: 1 - Symptomatic but completely ambulatory  Vitals:   08/08/18 0855  BP: 140/73  Pulse: 77  Resp: 18  Temp: 98.7 F (37.1 C)  SpO2: 98%   Filed Weights   08/08/18 0858  Weight: 178 lb 11.2 oz (81.1 kg)    GENERAL:alert, no distress and comfortable SKIN: no rash EYES:  sclera clear OROPHARYNX:no thrush or ulcers  LUNGS: clear to auscultation with normal breathing effort HEART: regular rate & rhythm, no lower extremity edema ABDOMEN:abdomen soft, non-tender and normal bowel sounds. No hepatomegaly  Musculoskeletal:no cyanosis of digits  NEURO: alert & oriented x 3 with fluent speech, normal gait PAC without erythema   LABORATORY DATA:  I have reviewed the data as listed CBC Latest Ref Rng & Units 08/08/2018 08/02/2018 07/26/2018  WBC 4.0 - 10.5 K/uL 9.6 5.3 9.1  Hemoglobin 12.0 - 15.0 g/dL 11.4(L) 12.4 12.2  Hematocrit 36.0 - 46.0 % 34.1(L) 37.2 37.0  Platelets 150 - 400 K/uL 221 204 312     CMP Latest Ref Rng & Units 08/08/2018 08/02/2018 07/26/2018  Glucose 70 - 99 mg/dL 123(H) 130(H) 178(H)  BUN 8 - 23 mg/dL _0 Creatinine 0.44 - 1.00 mg/dL 0.77 0.79 0.78  Sodium 135 - 145 mmol/L 139 138 140  Potassium 3.5 - 5.1 mmol/L 3.3(L) 3.7 3.7  Chloride 98 - 111 mmol/L 103 100 103  CO2 22 - 32 mmol/L _1 Calcium 8.9 - 10.3 mg/dL 8.7(L) 9.1 9.1  Total Protein 6.5 - 8.1 g/dL 6.0(L) 6.6 6.6  Total Bilirubin 0.3 - 1.2 mg/dL 0.4  0.7 0.6  Alkaline Phos 38 - 126 U/L 102 142(H) 122  AST 15 - 41 U/L 18 11(L) 24  ALT 0 - 44 U/L _0 RADIOGRAPHIC STUDIES: I have personally reviewed the radiological images as listed and agreed with the findings in the report. No results found.   ASSESSMENT & PLAN: DALTON MILLE is a 68 y.o. female with   1. 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 She was found to havea 2.4cmmass in head of pancreas, initial ERCP with stent placement , EUSand FNA biopsy of her pancreatic massfrom 07/14/18 confirmed high grade adenocarcinoma.Herpancreatic mass on the MRI and the EUS was borderline resectable, due to the invasion of portal vein. -Dr. Burr Medico previously reviewed staging PET PET from 07/21/18 which shows significant increase and hypermetabolic multiple liver lesions and porta hepatic LN which is compatible with liver metastasis. MD did not feel biopsy was necessary -Dr. Burr Medico recommended systemic chemotherapy with FOLFIRINOX q2weeks.  -s/p cycle 1 on 07/26/18, with Udenyca - she tolerated fairly well with fatigue, nausea, and mucositis. She required IVF one week after treatment.  -She has recovered well today, still with mild to moderate fatigue, but remains functional.  -Labs reviewed, CBC and CMP adequate for treatment. K 3.3, she will increase potassium in her diet. WBC slightly elevated likely due to [facility] -due to grade II mucositis, will omit 5FU bolus, no other dosage adjustments -she will get supportive IVF next week, f/u and next cycle in 2 weeks   2. Grade II mucositis -developed mucositis with ulcers and pain, limiting po intake, after cycle 1 -she recovered today, uses magic mouthwash and lidocaine PRN -will omit 5FU bolus from cycle 2   3. Epigastric Abdominal pain, secondary to #1  -Improved since stent placed on 07/01/18 -pain is controlled, she takes tramadol AM and before bed -I refilled  today  4.Obstructive jaundice,s/p stent placement on 07/01/18  -LFTs have improved after stent placement. Her jaundice has resolved.  -LFTs and Tbili normal now. Will monitor   5. Low appetite, Weight loss, Nausea  -Years ago she had gastric band placed. She is interested in having it removed.  -She has lost 32 pounds over 5 months secondary to her recent cancer.  -she had intermittent nausea and dry heaves on 3 occasions during first week after chemo, controlled with compazine, phenergan, and zofran.  -po intake was reduced due to decreased taste and mucositis, she lost 4 lbs during first week, but gained a pound back.   6. H/o of right breast cancer, RS 16, ER/PR +, HER2- -Managed by Med Onc Dr [name]  -Diagnosed in 11/2014. S/p right lumpectomy and adjuvant radiation.  -She has been on Anastrozole since 05/2015. She is tolerating well. Will continue   7. Genetic testing  -Given her history of cancer and pancreatic cancer, I recommend genetic testing for her and FO for her tumor which can given more treatment options. She is interested.  -negative on 08/01/18, no pathogenic mutations  8. HTN, DM -DM is mostly controlled, she is off medication -On Verapamil and atenolol -Her Kidney function tests have been normal lately. -BP normal lately -BG 123 today, Cr normal  PLAN: -Labs reviewed, adequate for treatment -Proceed with cycle 2 FOLFIRINOX, omit 5FU bolus; I reviewed with Dr. Burr Medico -Increase K in diet -IVF next week  -Refilled tramadol -f/u and  cycle 3 in 2 weeks   All questions were answered. The patient knows to call the clinic with any problems, questions or concerns. No barriers to learning was detected. I spent 20 minutes counseling the patient face to face. The total time spent in the appointment was 25 minutes and more than 50% was on counseling and review of test results     Alla Feeling, NP 08/08/18   Addendum: After completion of oxaliplatin, during  irinotecan and leucovorin, patient reported to RN numbness in her fingertips, hoarseness, and difficulty swallowing. Denies dyspnea. Sandi Mealy, PA was called to chairside. She received NS bolus, IV pepcid and benadryl. She was stable, alert, breathing normally on room air when I saw her. She was mildly hoarse. Symptoms improved and she was able to resume irinotecan and leucovorin. This was felt to represent a delayed oxaliplatin reaction. Dr. Burr Medico was also notified.  Cira Rue, NP  08/08/2018 4:07 PM

## 2018-08-08 NOTE — Telephone Encounter (Signed)
Scheduled appt per 7/27 los.  Patient will get a print out from her infusion nurse

## 2018-08-08 NOTE — Progress Notes (Deleted)
FOLLOW UP  Assessment and Plan:   Hypertension Well controlled with current medications  Monitor blood pressure at home; patient to call if consistently greater than 130/80 Continue DASH diet.   Reminder to go to the ER if any CP, SOB, nausea, dizziness, severe HA, changes vision/speech, left arm numbness and tingling and jaw pain.  Cholesterol Currently at goal; rosuvastatin *** Continue low cholesterol diet and exercise.  Check lipid panel.   Prediabetes Continue diet and exercise.  Perform daily foot/skin check, notify office of any concerning changes.  Check A1C annually at CPE; monitor weight, serum glucose   Obesity with co morbidities *** Long discussion about weight loss, diet, and exercise Recommended diet heavy in fruits and veggies and low in animal meats, cheeses, and dairy products, appropriate calorie intake Discussed ideal weight for height (below ***) and initial weight goal (***) Patient will work on *** Will follow up in 3 months  Vitamin D Def At goal at last visit; continue supplementation to maintain goal of 70-100 Defer Vit D level  Pancreatic cancer ***  Continue diet and meds as discussed. Further disposition pending results of labs. Discussed med's effects and SE's.   Over 30 minutes of exam, counseling, chart review, and critical decision making was performed.   Future Appointments  Date Time Provider [place]  08/10/2018 10:00 AM [facility] [place] FLUSH [facility] None  08/10/2018  3:45 PM Liane Comber, NP [facility] None  08/15/2018  8:30 AM [facility] INFUSION [facility] None  08/22/2018 10:30 AM [facility] ONLY [facility] None  08/22/2018 10:45 AM [facility] [place] FLUSH [facility] None  08/22/2018 11:15 AM Cira Rue K, NP [facility] None  08/22/2018 12:00 PM [facility] INFUSION [facility] None  08/24/2018 10:15 AM [facility] [place] FLUSH [facility] None  09/05/2018  8:30 AM [facility] LAB 6 [facility] None  09/05/2018  8:45 AM [facility] [place]  FLUSH [facility] None  09/05/2018  9:15 AM Alla Feeling, NP [facility] None  09/05/2018 10:15 AM [facility] INFUSION [facility] None  11/07/2018  2:30 PM Unk Pinto, MD [facility] None  01/19/2019  3:00 PM Vicie Mutters, PA-C [facility] None  01/26/2019  9:00 AM [facility] LAB 1 [facility] None  01/26/2019  9:30 AM Magrinat, Virgie Dad, MD [facility] None  05/08/2019  2:00 PM Unk Pinto, MD [facility] None    ----------------------------------------------------------------------------------------------------------------------  HPI 68 y.o. female  presents for 3 month follow up on hypertension, cholesterol, prediabetes, morbid obesity and vitamin D deficiency.   *** defer labs? ***  She was unfortunately diagnosed with primary pancreatic cancer after she presented with abdominal pain and LFTs trending up. 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 She is undergoing chemotherapy via cancer center, Dr. Burr Medico recommended systemic chemotherapy withFOLFIRINOX q2weeks.tramadol for pain ***, she is alternating antiemetics ***, mucositis    Trazodone 150 mg *** sleep and mood   She has hx of ER+ R breast cancer in 2016, underwent lumpectomy by Dr. Lucia Gaskins and radiation (Dr. Pablo Ledger) and continues with anastrozole followed by Dr. [name].   BMI is There is no height or weight on file to calculate BMI., she {HAS HAS VQQ:59563} been working on diet and exercise. She has hx of gastric lap band in 2011 with 60 lb weight loss, reversed due to cancer related weight loss *** Wt Readings from Last 3 Encounters:  08/08/18 178 lb 11.2 oz (81.1 kg)  08/02/18 177 lb (80.3 kg)  07/26/18 181 lb 9.6 oz (82.4 kg)  Her blood pressure {HAS HAS NOT:18834} been controlled at home, today their BP is    She {DOES_DOES ERD:40814} workout. She denies chest pain, shortness of breath, dizziness.   She is on  cholesterol medication Rosuvastatin 10 mg MWF *** ? Stop *** and denies myalgias. Her cholesterol is at goal. The cholesterol last visit was:   Lab Results  Component Value Date   CHOL 132 05/03/2018   HDL 50 05/03/2018   LDLCALC 56 05/03/2018   TRIG 193 (H) 05/03/2018   CHOLHDL 2.6 05/03/2018    She {Has/has not:18111} been working on diet and exercise for prediabetes, and denies {Symptoms; diabetes w/o none:19199}. Last A1C in the office was:  Lab Results  Component Value Date   HGBA1C 6.0 (H) 05/03/2018   Patient is on Vitamin D supplement.   Lab Results  Component Value Date   VD25OH 102 (H) 05/03/2018     Lab Results  Component Value Date   ALT 13 08/08/2018   AST 18 08/08/2018   ALKPHOS 102 08/08/2018   BILITOT 0.4 08/08/2018      Current Medications:  Current Outpatient Medications on File Prior to Visit  Medication Sig  . acetaminophen (TYLENOL) 500 MG tablet Take 1,000 mg by mouth 2 (two) times a day.  . anastrozole (ARIMIDEX) 1 MG tablet TAKE 1 TABLET BY MOUTH EVERY DAY (Patient taking differently: Take 1 mg by mouth daily. )  . aspirin EC 81 MG tablet Take 81 mg by mouth daily.  Marland Kitchen atenolol (TENORMIN) 100 MG tablet TAKE 1 TABLET BY MOUTH EVERY DAY (Patient taking differently: Take 50 mg by mouth daily after supper. )  . Cholecalciferol (VITAMIN D3) 5000 UNITS CAPS Take by mouth daily after supper.   . lidocaine (XYLOCAINE) 2 % solution Use as directed 5 mLs in the mouth or throat every 3 (three) hours as needed for mouth pain.  Marland Kitchen lidocaine-prilocaine (EMLA) cream Apply to affected area once  . magic mouthwash SOLN Take 5 mLs by mouth 4 (four) times daily as needed for mouth pain.  Marland Kitchen ondansetron (ZOFRAN) 8 MG tablet Take 1 tablet (8 mg total) by mouth 2 (two) times daily as needed. Start on day 3 after chemotherapy.  . prochlorperazine (COMPAZINE) 10 MG tablet Take 1 tablet (10 mg total) by mouth every 6 (six) hours as needed (Nausea or vomiting).  . promethazine  (PHENERGAN) 25 MG tablet Take 1/2-1 tab every 6 hours as needed for nausea.  . rosuvastatin (CRESTOR) 10 MG tablet   . traMADol (ULTRAM) 50 MG tablet Take 1 tablet (50 mg total) by mouth every 6 (six) hours as needed.  . traZODone (DESYREL) 150 MG tablet Take 1/2 to 1 tablet 1 to 2 hours before Bedtime (Patient taking differently: Take 150 mg by mouth. )  . verapamil (CALAN) 80 MG tablet Take 1 tablet 2 x /day with meal for BP (Patient taking differently: Take 80 mg by mouth 2 (two) times daily with a meal. )  . Zinc 50 MG TABS Take 50 mg by mouth daily after supper.   Current Facility-Administered Medications on File Prior to Visit  Medication  . atropine injection 0.4 mg  . fluorouracil (ADRUCIL) 4,650 mg in sodium chloride 0.9 % 57 mL chemo infusion  . heparin lock flush 100 unit/mL  . irinotecan (CAMPTOSAR) 280 mg in dextrose 5 % 500 mL chemo infusion  . leucovorin 772 mg in dextrose 5 % 250 mL infusion  . sodium chloride flush (NS) 0.9 % injection 10  mL     Allergies:  Allergies  Allergen Reactions  . Codeine Itching  . Lipitor [Atorvastatin] Other (See Comments)    Severe fatigue. ? memory loss.  . Ace Inhibitors Cough     Medical History:  Past Medical History:  Diagnosis Date  . Allergy    SEASONAL  . Anxiety   . At risk for difficult insertion of breathing tube   . Breast cancer of upper-outer quadrant of right female breast ([place]) 12/03/2014  . 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 Family history of breast cancer   . GERD (gastroesophageal reflux disease)    hx - resolved with lap band surgery  . H/O hiatal hernia    resolved with band surgery  . H/O laparoscopic adjustable gastric banding 2010  . History of kidney stones 02/2018  . Hyperlipidemia   . Hypertension   . Hypokalemia   . Insomnia    tx with zanax  . Obesity   . Pancreatic mass   . PCOS (polycystic ovarian syndrome)   . Personal history of radiation therapy   . 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 Type II or unspecified type diabetes mellitus without mention of complication, not stated as uncontrolled 12/14/2012   no meds, diet controlled  . Wears contact lenses   . Wears glasses    Family history- Reviewed and unchanged Social history- Reviewed and unchanged   Review of Systems:  ROS    Physical Exam: There were no vitals taken for this visit. Wt Readings from Last 3 Encounters:  08/08/18 178 lb 11.2 oz (81.1 kg)  08/02/18 177 lb (80.3 kg)  07/26/18 181 lb 9.6 oz (82.4 kg)   General Appearance: Well nourished, in no apparent distress. Eyes: PERRLA, EOMs, conjunctiva no swelling or erythema Sinuses: No Frontal/maxillary tenderness ENT/Mouth: Ext aud canals clear, TMs without erythema, bulging. No erythema, swelling, or exudate on post pharynx.  Tonsils not swollen or erythematous. Hearing normal.  Neck: Supple, thyroid normal.  Respiratory: Respiratory effort normal, BS equal bilaterally without rales, rhonchi, wheezing or stridor.  Cardio: RRR with no MRGs. Brisk peripheral pulses without edema.  Abdomen: Soft, + BS.  Non tender, no guarding, rebound, hernias, masses. Lymphatics: Non tender without lymphadenopathy.  Musculoskeletal: Full ROM, 5/5 strength, Normal gait Skin: Warm, dry without rashes, lesions, ecchymosis.  Neuro: Cranial nerves intact. No cerebellar symptoms.  Psych: Awake and oriented X 3, normal affect, Insight and Judgment appropriate.    Izora Ribas, NP 2:01 PM [facility]

## 2018-08-08 NOTE — Progress Notes (Signed)
    DATE:  08/08/2018                                          X  CHEMO/IMMUNOTHERAPY REACTION            MD:  Dr. Truitt Merle   AGENT/BLOOD PRODUCT RECEIVING TODAY:               FOLFIRINOX   AGENT/BLOOD PRODUCT RECEIVING IMMEDIATELY PRIOR TO REACTION:           The patient had completed her infusion of oxaliplatin and was receiving irinotecan and leucovorin when she developed symptoms.   VS: BP:      147/74   P:        89       SPO2:        98% on room air                   REACTION(S):            Thickened tongue, hoarseness, and tingling in hands   PREMEDS:      Aloxi, dexamethasone, Em[place], and atropine   INTERVENTION: Patient was given Pepcid 20 mg IV x1 and Benadryl 25 mg   Review of Systems  Review of Systems  Constitutional: Negative for chills, diaphoresis and fever.  HENT: Positive for voice change. Negative for trouble swallowing.        Sensation of the tongue being thick  Respiratory: Negative for cough, chest tightness, shortness of breath and wheezing.   Cardiovascular: Negative for chest pain and palpitations.  Gastrointestinal: Negative for abdominal pain, constipation, diarrhea, nausea and vomiting.  Musculoskeletal: Negative for back pain and myalgias.  Neurological: Positive for numbness. Negative for dizziness, light-headedness and headaches.     Physical Exam  Physical Exam Constitutional:      General: She is not in acute distress.    Appearance: She is not diaphoretic.     Comments: The patient sounded hoarse  HENT:     Head: Normocephalic and atraumatic.  Cardiovascular:     Rate and Rhythm: Normal rate and regular rhythm.     Heart sounds: Normal heart sounds. No murmur. No friction rub. No gallop.   Pulmonary:     Effort: Pulmonary effort is normal. No respiratory distress.     Breath sounds: Normal breath sounds. No wheezing or rales.  Skin:    General: Skin is warm and dry.     Findings: No erythema or rash.  Neurological:     Mental  Status: She is alert.     OUTCOME:            The patient's symptoms resolved except for mild scratchiness of her throat..  Irinotecan and leucovorin were restarted without further issues of concern.        Sandi Mealy, MHS, PA-C

## 2018-08-10 ENCOUNTER — Other Ambulatory Visit: Payer: Self-pay

## 2018-08-10 ENCOUNTER — Ambulatory Visit: Payer: Medicare Other | Admitting: Adult Health

## 2018-08-10 ENCOUNTER — Inpatient Hospital Stay: Payer: Medicare Other

## 2018-08-10 VITALS — BP 154/78 | HR 72 | Temp 98.3°F | Resp 16

## 2018-08-10 DIAGNOSIS — Z95828 Presence of other vascular implants and grafts: Secondary | ICD-10-CM

## 2018-08-10 DIAGNOSIS — K219 Gastro-esophageal reflux disease without esophagitis: Secondary | ICD-10-CM | POA: Diagnosis not present

## 2018-08-10 DIAGNOSIS — C250 Malignant neoplasm of head of pancreas: Secondary | ICD-10-CM | POA: Diagnosis not present

## 2018-08-10 DIAGNOSIS — Z79899 Other long term (current) drug therapy: Secondary | ICD-10-CM | POA: Diagnosis not present

## 2018-08-10 DIAGNOSIS — I129 Hypertensive chronic kidney disease with stage 1 through stage 4 chronic kidney disease, or unspecified chronic kidney disease: Secondary | ICD-10-CM | POA: Diagnosis not present

## 2018-08-10 DIAGNOSIS — Z803 Family history of malignant neoplasm of breast: Secondary | ICD-10-CM | POA: Diagnosis not present

## 2018-08-10 DIAGNOSIS — G4700 Insomnia, unspecified: Secondary | ICD-10-CM | POA: Diagnosis not present

## 2018-08-10 DIAGNOSIS — Z5111 Encounter for antineoplastic chemotherapy: Secondary | ICD-10-CM | POA: Diagnosis not present

## 2018-08-10 DIAGNOSIS — E1122 Type 2 diabetes mellitus with diabetic chronic kidney disease: Secondary | ICD-10-CM | POA: Diagnosis not present

## 2018-08-10 DIAGNOSIS — K1230 Oral mucositis (ulcerative), unspecified: Secondary | ICD-10-CM | POA: Diagnosis not present

## 2018-08-10 DIAGNOSIS — Z170 Estrogen receptor positive status [ER+]: Secondary | ICD-10-CM | POA: Diagnosis not present

## 2018-08-10 DIAGNOSIS — K831 Obstruction of bile duct: Secondary | ICD-10-CM | POA: Diagnosis not present

## 2018-08-10 DIAGNOSIS — C787 Secondary malignant neoplasm of liver and intrahepatic bile duct: Secondary | ICD-10-CM | POA: Diagnosis not present

## 2018-08-10 DIAGNOSIS — C50411 Malignant neoplasm of upper-outer quadrant of right female breast: Secondary | ICD-10-CM | POA: Diagnosis not present

## 2018-08-10 DIAGNOSIS — Z9884 Bariatric surgery status: Secondary | ICD-10-CM | POA: Diagnosis not present

## 2018-08-10 DIAGNOSIS — K449 Diaphragmatic hernia without obstruction or gangrene: Secondary | ICD-10-CM | POA: Diagnosis not present

## 2018-08-10 DIAGNOSIS — E785 Hyperlipidemia, unspecified: Secondary | ICD-10-CM | POA: Diagnosis not present

## 2018-08-10 DIAGNOSIS — M546 Pain in thoracic spine: Secondary | ICD-10-CM | POA: Diagnosis not present

## 2018-08-10 DIAGNOSIS — N182 Chronic kidney disease, stage 2 (mild): Secondary | ICD-10-CM | POA: Diagnosis not present

## 2018-08-10 DIAGNOSIS — Z79811 Long term (current) use of aromatase inhibitors: Secondary | ICD-10-CM | POA: Diagnosis not present

## 2018-08-10 MED ORDER — HEPARIN SOD (PORK) LOCK FLUSH 100 UNIT/ML IV SOLN
500.0000 [IU] | Freq: Once | INTRAVENOUS | Status: AC | PRN
  Administered 2018-08-10: 15:00:00 500 [IU]
  Filled 2018-08-10: qty 5

## 2018-08-10 MED ORDER — SODIUM CHLORIDE 0.9% FLUSH
10.0000 mL | Freq: Once | INTRAVENOUS | Status: AC
  Administered 2018-08-10: 10 mL
  Filled 2018-08-10: qty 10

## 2018-08-10 MED ORDER — PEGFILGRASTIM-CBQV 6 MG/0.6ML ~~LOC~~ SOSY
PREFILLED_SYRINGE | SUBCUTANEOUS | Status: AC
  Filled 2018-08-10: qty 0.6

## 2018-08-10 MED ORDER — PEGFILGRASTIM-CBQV 6 MG/0.6ML ~~LOC~~ SOSY
6.0000 mg | PREFILLED_SYRINGE | Freq: Once | SUBCUTANEOUS | Status: AC
  Administered 2018-08-10: 6 mg via SUBCUTANEOUS

## 2018-08-10 NOTE — Patient Instructions (Signed)
Pegfilgrastim injection What is this medicine? PEGFILGRASTIM (PEG fil gra stim) is a long-acting granulocyte colony-stimulating factor that stimulates the growth of neutrophils, a type of white blood cell important in the body's fight against infection. It is used to reduce the incidence of fever and infection in patients with certain types of cancer who are receiving chemotherapy that affects the bone marrow, and to increase survival after being exposed to high doses of radiation. This medicine may be used for other purposes; ask your health care provider or pharmacist if you have questions. COMMON BRAND NAME(S): Fulphila, Neulasta, UDENYCA, Ziextenzo What should I tell my health care provider before I take this medicine? They need to know if you have any of these conditions:  kidney disease  latex allergy  ongoing radiation therapy  sickle cell disease  skin reactions to acrylic adhesives (On-Body Injector only)  an unusual or allergic reaction to pegfilgrastim, filgrastim, other medicines, foods, dyes, or preservatives  pregnant or trying to get pregnant  breast-feeding How should I use this medicine? This medicine is for injection under the skin. If you get this medicine at home, you will be taught how to prepare and give the pre-filled syringe or how to use the On-body Injector. Refer to the patient Instructions for Use for detailed instructions. Use exactly as directed. Tell your healthcare provider immediately if you suspect that the On-body Injector may not have performed as intended or if you suspect the use of the On-body Injector resulted in a missed or partial dose. It is important that you put your used needles and syringes in a special sharps container. Do not put them in a trash can. If you do not have a sharps container, call your pharmacist or healthcare provider to get one. Talk to your pediatrician regarding the use of this medicine in children. While this drug may be  prescribed for selected conditions, precautions do apply. Overdosage: If you think you have taken too much of this medicine contact a poison control center or emergency room at once. NOTE: This medicine is only for you. Do not share this medicine with others. What if I miss a dose? It is important not to miss your dose. Call your doctor or health care professional if you miss your dose. If you miss a dose due to an On-body Injector failure or leakage, a new dose should be administered as soon as possible using a single prefilled syringe for manual use. What may interact with this medicine? Interactions have not been studied. Give your health care provider a list of all the medicines, herbs, non-prescription drugs, or dietary supplements you use. Also tell them if you smoke, drink alcohol, or use illegal drugs. Some items may interact with your medicine. This list may not describe all possible interactions. Give your health care provider a list of all the medicines, herbs, non-prescription drugs, or dietary supplements you use. Also tell them if you smoke, drink alcohol, or use illegal drugs. Some items may interact with your medicine. What should I watch for while using this medicine? You may need blood work done while you are taking this medicine. If you are going to need a MRI, CT scan, or other procedure, tell your doctor that you are using this medicine (On-Body Injector only). What side effects may I notice from receiving this medicine? Side effects that you should report to your doctor or health care professional as soon as possible:  allergic reactions like skin rash, itching or hives, swelling of the   face, lips, or tongue  back pain  dizziness  fever  pain, redness, or irritation at site where injected  pinpoint red spots on the skin  red or dark-brown urine  shortness of breath or breathing problems  stomach or side pain, or pain at the shoulder  swelling  tiredness   trouble passing urine or change in the amount of urine Side effects that usually do not require medical attention (report to your doctor or health care professional if they continue or are bothersome):  bone pain  muscle pain This list may not describe all possible side effects. Call your doctor for medical advice about side effects. You may report side effects to FDA at 1-800-FDA-1088. Where should I keep my medicine? Keep out of the reach of children. If you are using this medicine at home, you will be instructed on how to store it. Throw away any unused medicine after the expiration date on the label. NOTE: This sheet is a summary. It may not cover all possible information. If you have questions about this medicine, talk to your doctor, pharmacist, or health care provider.  2020 Elsevier/Gold Standard (2017-04-05 16:57:08) [employer], Adult A central line is a thin, flexible tube (catheter) that is put in your vein. It can be used to:  Give you medicine.  Give you food and nutrients. Follow these instructions at home: Caring for the tube   Follow instructions from your doctor about: ? Flushing the tube with saline solution. ? Cleaning the tube and the area around it.  Only flush with clean (sterile) supplies. The supplies should be from your doctor, a pharmacy, or another place that your doctor recommends.  Before you flush the tube or clean the area around the tube: ? Wash your hands with soap and water. If you cannot use soap and water, use hand sanitizer. ? Clean the central line hub with rubbing alcohol. Caring for your skin  Keep the area where the tube was put in clean and dry.  Every day, and when changing the bandage, check the skin around the central line for: ? Redness, swelling, or pain. ? Fluid or blood. ? Warmth. ? Pus. ? A bad smell. General instructions  Keep the tube clamped, unless it is being used.  Keep your supplies in a clean, dry location.   If you or someone else accidentally pulls on the tube, make sure: ? The bandage (dressing) is okay. ? There is no bleeding. ? The tube has not been pulled out.  Do not use scissors or sharp objects near the tube.  Do not swim or let the tube soak in a tub.  Ask your doctor what activities are safe for you. Your doctor may tell you not to lift anything or move your arm too much.  Take over-the-counter and prescription medicines only as told by your doctor.  Change bandages as told by your doctor.  Keep your bandage dry. If a bandage gets wet, have it changed right away.  Keep all follow-up visits as told by your doctor. This is important. Throwing away supplies  Throw away any syringes in a trash (disposal) container that is only for sharp items (sharps container). You can buy a sharps container from a pharmacy, or you can make one by using an empty hard plastic bottle with a cover.  Place any used bandages or infusion bags into a plastic bag. Throw that bag in the trash. Contact a doctor if:  You have any of these  where the tube was put in: ? Redness, swelling, or pain. ? Fluid or blood. ? A warm feeling. ? Pus or a bad smell. Get help right away if:  You have: ? A fever. ? Chills. ? Trouble getting enough air (shortness of breath). ? Trouble breathing. ? Pain in your chest. ? Swelling in your neck, face, chest, or arm.  You are coughing.  You feel your heart beating fast or skipping beats.  You feel dizzy or you pass out (faint).  There are red lines coming from where the tube was put in.  The area where the tube was put in is bleeding and the bleeding will not stop.  Your tube is hard to flush.  You do not get a blood return from the tube.  The tube gets loose or comes out.  The tube has a hole or a tear.  The tube leaks. Summary  A central line is a thin, flexible tube (catheter) that is put in your vein. It can be used to take blood for lab tests or to  give you medicine.  Follow instructions from your doctor about flushing and cleaning the tube.  Keep the area where the tube was put in clean and dry.  Ask your doctor what activities are safe for you. This information is not intended to replace advice given to you by your health care provider. Make sure you discuss any questions you have with your health care provider. Document Released: 12/16/2011 Document Revised: 04/20/2018 Document Reviewed: 01/16/2016 Elsevier Patient Education  2020 [employer].

## 2018-08-15 ENCOUNTER — Inpatient Hospital Stay: Payer: Medicare Other | Attending: Hematology

## 2018-08-15 ENCOUNTER — Other Ambulatory Visit: Payer: Self-pay

## 2018-08-15 VITALS — BP 123/76 | HR 88 | Temp 97.8°F | Resp 18

## 2018-08-15 DIAGNOSIS — B370 Candidal stomatitis: Secondary | ICD-10-CM | POA: Insufficient documentation

## 2018-08-15 DIAGNOSIS — Z79811 Long term (current) use of aromatase inhibitors: Secondary | ICD-10-CM | POA: Insufficient documentation

## 2018-08-15 DIAGNOSIS — C250 Malignant neoplasm of head of pancreas: Secondary | ICD-10-CM | POA: Diagnosis not present

## 2018-08-15 DIAGNOSIS — R630 Anorexia: Secondary | ICD-10-CM | POA: Insufficient documentation

## 2018-08-15 DIAGNOSIS — Z923 Personal history of irradiation: Secondary | ICD-10-CM | POA: Insufficient documentation

## 2018-08-15 DIAGNOSIS — K831 Obstruction of bile duct: Secondary | ICD-10-CM | POA: Diagnosis not present

## 2018-08-15 DIAGNOSIS — Z87442 Personal history of urinary calculi: Secondary | ICD-10-CM | POA: Insufficient documentation

## 2018-08-15 DIAGNOSIS — R490 Dysphonia: Secondary | ICD-10-CM | POA: Diagnosis not present

## 2018-08-15 DIAGNOSIS — E669 Obesity, unspecified: Secondary | ICD-10-CM | POA: Insufficient documentation

## 2018-08-15 DIAGNOSIS — I10 Essential (primary) hypertension: Secondary | ICD-10-CM | POA: Insufficient documentation

## 2018-08-15 DIAGNOSIS — I700 Atherosclerosis of aorta: Secondary | ICD-10-CM | POA: Insufficient documentation

## 2018-08-15 DIAGNOSIS — E119 Type 2 diabetes mellitus without complications: Secondary | ICD-10-CM | POA: Insufficient documentation

## 2018-08-15 DIAGNOSIS — R1013 Epigastric pain: Secondary | ICD-10-CM | POA: Diagnosis not present

## 2018-08-15 DIAGNOSIS — R634 Abnormal weight loss: Secondary | ICD-10-CM | POA: Diagnosis not present

## 2018-08-15 DIAGNOSIS — Z9884 Bariatric surgery status: Secondary | ICD-10-CM | POA: Insufficient documentation

## 2018-08-15 DIAGNOSIS — Z7982 Long term (current) use of aspirin: Secondary | ICD-10-CM | POA: Insufficient documentation

## 2018-08-15 DIAGNOSIS — C787 Secondary malignant neoplasm of liver and intrahepatic bile duct: Secondary | ICD-10-CM | POA: Diagnosis not present

## 2018-08-15 DIAGNOSIS — R112 Nausea with vomiting, unspecified: Secondary | ICD-10-CM | POA: Diagnosis not present

## 2018-08-15 DIAGNOSIS — E876 Hypokalemia: Secondary | ICD-10-CM | POA: Insufficient documentation

## 2018-08-15 DIAGNOSIS — Z170 Estrogen receptor positive status [ER+]: Secondary | ICD-10-CM | POA: Insufficient documentation

## 2018-08-15 DIAGNOSIS — K219 Gastro-esophageal reflux disease without esophagitis: Secondary | ICD-10-CM | POA: Diagnosis not present

## 2018-08-15 DIAGNOSIS — E785 Hyperlipidemia, unspecified: Secondary | ICD-10-CM | POA: Diagnosis not present

## 2018-08-15 DIAGNOSIS — R5383 Other fatigue: Secondary | ICD-10-CM | POA: Diagnosis not present

## 2018-08-15 DIAGNOSIS — Z5111 Encounter for antineoplastic chemotherapy: Secondary | ICD-10-CM | POA: Insufficient documentation

## 2018-08-15 DIAGNOSIS — F419 Anxiety disorder, unspecified: Secondary | ICD-10-CM | POA: Insufficient documentation

## 2018-08-15 DIAGNOSIS — C50411 Malignant neoplasm of upper-outer quadrant of right female breast: Secondary | ICD-10-CM | POA: Insufficient documentation

## 2018-08-15 DIAGNOSIS — Z95828 Presence of other vascular implants and grafts: Secondary | ICD-10-CM

## 2018-08-15 DIAGNOSIS — K1230 Oral mucositis (ulcerative), unspecified: Secondary | ICD-10-CM | POA: Diagnosis not present

## 2018-08-15 DIAGNOSIS — Z79899 Other long term (current) drug therapy: Secondary | ICD-10-CM | POA: Insufficient documentation

## 2018-08-15 MED ORDER — SODIUM CHLORIDE 0.9% FLUSH
10.0000 mL | Freq: Once | INTRAVENOUS | Status: AC
  Administered 2018-08-15: 10 mL
  Filled 2018-08-15: qty 10

## 2018-08-15 MED ORDER — HEPARIN SOD (PORK) LOCK FLUSH 100 UNIT/ML IV SOLN
500.0000 [IU] | Freq: Once | INTRAVENOUS | Status: AC
  Administered 2018-08-15: 500 [IU]
  Filled 2018-08-15: qty 5

## 2018-08-15 MED ORDER — SODIUM CHLORIDE 0.9 % IV SOLN
INTRAVENOUS | Status: DC
  Administered 2018-08-15: 09:00:00 via INTRAVENOUS
  Filled 2018-08-15 (×2): qty 250

## 2018-08-15 NOTE — Patient Instructions (Signed)
Rehydration, Adult Rehydration is the replacement of body fluids and salts and minerals (electrolytes) that are lost during dehydration. Dehydration is when there is not enough fluid or water in the body. This happens when you lose more fluids than you take in. Common causes of dehydration include:  Vomiting.  Diarrhea.  Excessive sweating, such as from heat exposure or exercise.  Taking medicines that cause the body to lose excess fluid (diuretics).  Impaired kidney function.  Not drinking enough fluid.  Certain illnesses or infections.  Certain poorly controlled long-term (chronic) illnesses, such as diabetes, heart disease, and kidney disease.  Symptoms of mild dehydration may include thirst, dry lips and mouth, dry skin, and dizziness. Symptoms of severe dehydration may include increased heart rate, confusion, fainting, and not urinating. You can rehydrate by drinking certain fluids or getting fluids through an IV tube, as told by your health care provider. What are the risks? Generally, rehydration is safe. However, one problem that can happen is taking in too much fluid (overhydration). This is rare. If overhydration happens, it can cause an electrolyte imbalance, kidney failure, or a decrease in salt (sodium) levels in the body. How to rehydrate Follow instructions from your health care provider for rehydration. The kind of fluid you should drink and the amount you should drink depend on your condition.  If directed by your health care provider, drink an oral rehydration solution (ORS). This is a drink designed to treat dehydration that is found in pharmacies and retail stores. ? Make an ORS by following instructions on the package. ? Start by drinking small amounts, about  cup (120 mL) every 5-10 minutes. ? Slowly increase how much you drink until you have taken the amount recommended by your health care provider.  Drink enough clear fluids to keep your urine clear or pale  yellow. If you were instructed to drink an ORS, finish the ORS first, then start slowly drinking other clear fluids. Drink fluids such as: ? Water. Do not drink only water. Doing that can lead to having too little sodium in your body (hyponatremia). ? Ice chips. ? Fruit juice that you have added water to (diluted juice). ? Low-calorie sports drinks.  If you are severely dehydrated, your health care provider may recommend that you receive fluids through an IV tube in the hospital.  Do not take sodium tablets. Doing that can lead to the condition of having too much sodium in your body (hypernatremia). Eating while you rehydrate Follow instructions from your health care provider about what to eat while you rehydrate. Your health care provider may recommend that you slowly begin eating regular foods in small amounts.  Eat foods that contain a healthy balance of electrolytes, such as bananas, oranges, potatoes, tomatoes, and spinach.  Avoid foods that are greasy or contain a lot of fat or sugar.  In some cases, you may get nutrition through a feeding tube that is passed through your nose and into your stomach (nasogastric tube, or NG tube). This may be done if you have uncontrolled vomiting or diarrhea. Beverages to avoid Certain beverages may make dehydration worse. While you rehydrate, avoid:  Alcohol.  Caffeine.  Drinks that contain a lot of sugar. These include: ? High-calorie sports drinks. ? Fruit juice that is not diluted. ? Soda.  Check nutrition labels to see how much sugar or caffeine a beverage contains. Signs of dehydration recovery You may be recovering from dehydration if:  You are urinating more often than before you started   rehydrating.  Your urine is clear or pale yellow.  Your energy level improves.  You vomit less frequently.  You have diarrhea less frequently.  Your appetite improves or returns to normal.  You feel less dizzy or less light-headed.  Your  skin tone and color start to look more normal. Contact a health care provider if:  You continue to have symptoms of mild dehydration, such as: ? Thirst. ? Dry lips. ? Slightly dry mouth. ? Dry, warm skin. ? Dizziness.  You continue to vomit or have diarrhea. Get help right away if:  You have symptoms of dehydration that get worse.  You feel: ? Confused. ? Weak. ? Like you are going to faint.  You have not urinated in 6-8 hours.  You have very dark urine.  You have trouble breathing.  Your heart rate while sitting still is over 100 beats a minute.  You cannot drink fluids without vomiting.  You have vomiting or diarrhea that: ? Gets worse. ? Does not go away.  You have a fever. This information is not intended to replace advice given to you by your health care provider. Make sure you discuss any questions you have with your health care provider. Document Released: 03/23/2011 Document Revised: 12/11/2016 Document Reviewed: 02/22/2015 Elsevier Patient Education  2020 [employer]  Coronavirus (COVID-19) Are you at risk?  Are you at risk for the Coronavirus (COVID-19)?  To be considered HIGH RISK for Coronavirus (COVID-19), you have to meet the following criteria:  . Traveled to [place], [place], [place], [place] or [place]; or in the [place] to [place], [place], [place], or [place]; and have fever, cough, and shortness of breath within the last 2 weeks of travel OR . Been in close contact with a person diagnosed with COVID-19 within the last 2 weeks and have fever, cough, and shortness of breath . IF YOU DO NOT MEET THESE CRITERIA, YOU ARE CONSIDERED LOW RISK FOR COVID-19.  What to do if you are HIGH RISK for COVID-19?  . If you are having a medical emergency, call 911. . Seek medical care right away. Before you go to a doctor's office, urgent care or emergency department, call ahead and tell them about your recent travel, contact with someone diagnosed  with COVID-19, and your symptoms. You should receive instructions from your physician's office regarding next steps of care.  . When you arrive at healthcare provider, tell the healthcare staff immediately you have returned from visiting [place], [place], [place], [place] or [place]; or traveled in the [place] to [place], [place], [place], or [place]; in the last two weeks or you have been in close contact with a person diagnosed with COVID-19 in the last 2 weeks.   . Tell the health care staff about your symptoms: fever, cough and shortness of breath. . After you have been seen by a medical provider, you will be either: o Tested for (COVID-19) and discharged home on quarantine except to seek medical care if symptoms worsen, and asked to  - Stay home and avoid contact with others until you get your results (4-5 days)  - Avoid travel on public transportation if possible (such as bus, train, or airplane) or o Sent to the Emergency Department by EMS for evaluation, COVID-19 testing, and possible admission depending on your condition and test results.  What to do if you are LOW RISK for COVID-19?  Reduce your risk of any infection by using the same precautions   used for avoiding the common cold or flu:  . Wash your hands often with soap and warm water for at least 20 seconds.  If soap and water are not readily available, use an alcohol-based hand sanitizer with at least 60% alcohol.  . If coughing or sneezing, cover your mouth and nose by coughing or sneezing into the elbow areas of your shirt or coat, into a tissue or into your sleeve (not your hands). . Avoid shaking hands with others and consider head nods or verbal greetings only. . Avoid touching your eyes, nose, or mouth with unwashed hands.  . Avoid close contact with people who are sick. . Avoid places or events with large numbers of people in one location, like concerts or sporting events. . Carefully consider travel plans you have  or are making. . If you are planning any travel outside or inside the [place], visit the CDC's Travelers' Health webpage for the latest health notices. . If you have some symptoms but not all symptoms, continue to monitor at home and seek medical attention if your symptoms worsen. . If you are having a medical emergency, call 911.   ADDITIONAL HEALTHCARE OPTIONS FOR PATIENTS  [facility] / e-Visit: [email or web address]         [facility]: [phone]  [facility]: [phone]                   [facility]: [phone]   

## 2018-08-22 ENCOUNTER — Other Ambulatory Visit: Payer: Self-pay

## 2018-08-22 ENCOUNTER — Inpatient Hospital Stay (HOSPITAL_BASED_OUTPATIENT_CLINIC_OR_DEPARTMENT_OTHER): Payer: Medicare Other | Admitting: Nurse Practitioner

## 2018-08-22 ENCOUNTER — Other Ambulatory Visit: Payer: Self-pay | Admitting: Hematology

## 2018-08-22 ENCOUNTER — Inpatient Hospital Stay: Payer: Medicare Other

## 2018-08-22 ENCOUNTER — Telehealth: Payer: Self-pay | Admitting: Emergency Medicine

## 2018-08-22 ENCOUNTER — Inpatient Hospital Stay: Payer: Medicare Other | Admitting: Nurse Practitioner

## 2018-08-22 ENCOUNTER — Encounter: Payer: Self-pay | Admitting: Nurse Practitioner

## 2018-08-22 ENCOUNTER — Other Ambulatory Visit: Payer: Self-pay | Admitting: Nurse Practitioner

## 2018-08-22 VITALS — BP 128/70 | HR 83 | Temp 98.7°F | Resp 18 | Ht 63.0 in | Wt 173.9 lb

## 2018-08-22 DIAGNOSIS — K1230 Oral mucositis (ulcerative), unspecified: Secondary | ICD-10-CM | POA: Diagnosis not present

## 2018-08-22 DIAGNOSIS — Z95828 Presence of other vascular implants and grafts: Secondary | ICD-10-CM

## 2018-08-22 DIAGNOSIS — E876 Hypokalemia: Secondary | ICD-10-CM

## 2018-08-22 DIAGNOSIS — E119 Type 2 diabetes mellitus without complications: Secondary | ICD-10-CM | POA: Diagnosis not present

## 2018-08-22 DIAGNOSIS — I700 Atherosclerosis of aorta: Secondary | ICD-10-CM | POA: Diagnosis not present

## 2018-08-22 DIAGNOSIS — Z5111 Encounter for antineoplastic chemotherapy: Secondary | ICD-10-CM | POA: Diagnosis not present

## 2018-08-22 DIAGNOSIS — C250 Malignant neoplasm of head of pancreas: Secondary | ICD-10-CM

## 2018-08-22 DIAGNOSIS — R1013 Epigastric pain: Secondary | ICD-10-CM | POA: Diagnosis not present

## 2018-08-22 DIAGNOSIS — K219 Gastro-esophageal reflux disease without esophagitis: Secondary | ICD-10-CM | POA: Diagnosis not present

## 2018-08-22 DIAGNOSIS — R490 Dysphonia: Secondary | ICD-10-CM | POA: Diagnosis not present

## 2018-08-22 DIAGNOSIS — C50411 Malignant neoplasm of upper-outer quadrant of right female breast: Secondary | ICD-10-CM

## 2018-08-22 DIAGNOSIS — I10 Essential (primary) hypertension: Secondary | ICD-10-CM | POA: Diagnosis not present

## 2018-08-22 DIAGNOSIS — C787 Secondary malignant neoplasm of liver and intrahepatic bile duct: Secondary | ICD-10-CM | POA: Diagnosis not present

## 2018-08-22 DIAGNOSIS — R5383 Other fatigue: Secondary | ICD-10-CM | POA: Diagnosis not present

## 2018-08-22 DIAGNOSIS — Z923 Personal history of irradiation: Secondary | ICD-10-CM | POA: Diagnosis not present

## 2018-08-22 DIAGNOSIS — Z170 Estrogen receptor positive status [ER+]: Secondary | ICD-10-CM

## 2018-08-22 DIAGNOSIS — K831 Obstruction of bile duct: Secondary | ICD-10-CM | POA: Diagnosis not present

## 2018-08-22 DIAGNOSIS — E785 Hyperlipidemia, unspecified: Secondary | ICD-10-CM | POA: Diagnosis not present

## 2018-08-22 DIAGNOSIS — Z79811 Long term (current) use of aromatase inhibitors: Secondary | ICD-10-CM | POA: Diagnosis not present

## 2018-08-22 DIAGNOSIS — B370 Candidal stomatitis: Secondary | ICD-10-CM | POA: Diagnosis not present

## 2018-08-22 DIAGNOSIS — R112 Nausea with vomiting, unspecified: Secondary | ICD-10-CM | POA: Diagnosis not present

## 2018-08-22 LAB — CBC WITH DIFFERENTIAL (CANCER CENTER ONLY)
Abs Immature Granulocytes: 4.2 10*3/uL — ABNORMAL HIGH (ref 0.00–0.07)
Basophils Absolute: 0.2 10*3/uL — ABNORMAL HIGH (ref 0.0–0.1)
Basophils Relative: 1 %
Eosinophils Absolute: 0 10*3/uL (ref 0.0–0.5)
Eosinophils Relative: 0 %
HCT: 36.9 % (ref 36.0–46.0)
Hemoglobin: 12.4 g/dL (ref 12.0–15.0)
Immature Granulocytes: 19 %
Lymphocytes Relative: 16 %
Lymphs Abs: 3.4 10*3/uL (ref 0.7–4.0)
MCH: 31.6 pg (ref 26.0–34.0)
MCHC: 33.6 g/dL (ref 30.0–36.0)
MCV: 93.9 fL (ref 80.0–100.0)
Monocytes Absolute: 1.7 10*3/uL — ABNORMAL HIGH (ref 0.1–1.0)
Monocytes Relative: 8 %
Neutro Abs: 12.5 10*3/uL — ABNORMAL HIGH (ref 1.7–7.7)
Neutrophils Relative %: 56 %
Platelet Count: 251 10*3/uL (ref 150–400)
RBC: 3.93 MIL/uL (ref 3.87–5.11)
RDW: 15.3 % (ref 11.5–15.5)
WBC Count: 22 10*3/uL — ABNORMAL HIGH (ref 4.0–10.5)
nRBC: 2.7 % — ABNORMAL HIGH (ref 0.0–0.2)

## 2018-08-22 LAB — CMP (CANCER CENTER ONLY)
ALT: 19 U/L (ref 0–44)
AST: 21 U/L (ref 15–41)
Albumin: 3.3 g/dL — ABNORMAL LOW (ref 3.5–5.0)
Alkaline Phosphatase: 131 U/L — ABNORMAL HIGH (ref 38–126)
Anion gap: 13 (ref 5–15)
BUN: 12 mg/dL (ref 8–23)
CO2: 26 mmol/L (ref 22–32)
Calcium: 8.8 mg/dL — ABNORMAL LOW (ref 8.9–10.3)
Chloride: 102 mmol/L (ref 98–111)
Creatinine: 0.83 mg/dL (ref 0.44–1.00)
GFR, Est AFR Am: >60 mL/min (ref >60)
GFR, Estimated: >60 mL/min (ref >60)
Glucose, Bld: 113 mg/dL — ABNORMAL HIGH (ref 70–99)
Potassium: 3 mmol/L — CL (ref 3.5–5.1)
Sodium: 141 mmol/L (ref 135–145)
Total Bilirubin: 0.4 mg/dL (ref 0.3–1.2)
Total Protein: 6 g/dL — ABNORMAL LOW (ref 6.5–8.1)

## 2018-08-22 MED ORDER — SODIUM CHLORIDE 0.9 % IV SOLN
Freq: Once | INTRAVENOUS | Status: AC
  Administered 2018-08-22: 12:00:00 via INTRAVENOUS
  Filled 2018-08-22: qty 250

## 2018-08-22 MED ORDER — PALONOSETRON HCL INJECTION 0.25 MG/5ML
INTRAVENOUS | Status: AC
  Filled 2018-08-22: qty 5

## 2018-08-22 MED ORDER — SODIUM CHLORIDE 0.9 % IV SOLN
2400.0000 mg/m2 | INTRAVENOUS | Status: DC
  Administered 2018-08-22: 4650 mg via INTRAVENOUS
  Filled 2018-08-22: qty 93

## 2018-08-22 MED ORDER — POTASSIUM CHLORIDE CRYS ER 20 MEQ PO TBCR
EXTENDED_RELEASE_TABLET | ORAL | Status: AC
  Filled 2018-08-22: qty 1

## 2018-08-22 MED ORDER — SODIUM CHLORIDE 0.9 % IV SOLN
Freq: Once | INTRAVENOUS | Status: AC
  Administered 2018-08-22: 13:00:00 via INTRAVENOUS
  Filled 2018-08-22: qty 5

## 2018-08-22 MED ORDER — ATROPINE SULFATE 1 MG/ML IJ SOLN
INTRAMUSCULAR | Status: AC
  Filled 2018-08-22: qty 1

## 2018-08-22 MED ORDER — SODIUM CHLORIDE 0.9 % IV SOLN
Freq: Once | INTRAVENOUS | Status: AC
  Administered 2018-08-22: 14:00:00 via INTRAVENOUS
  Filled 2018-08-22: qty 250

## 2018-08-22 MED ORDER — PALONOSETRON HCL INJECTION 0.25 MG/5ML
0.2500 mg | Freq: Once | INTRAVENOUS | Status: AC
  Administered 2018-08-22: 0.25 mg via INTRAVENOUS

## 2018-08-22 MED ORDER — LEUCOVORIN CALCIUM INJECTION 350 MG
400.0000 mg/m2 | Freq: Once | INTRAVENOUS | Status: AC
  Administered 2018-08-22: 772 mg via INTRAVENOUS
  Filled 2018-08-22: qty 38.6

## 2018-08-22 MED ORDER — SODIUM CHLORIDE 0.9% FLUSH
10.0000 mL | Freq: Once | INTRAVENOUS | Status: AC
  Administered 2018-08-22: 10 mL
  Filled 2018-08-22: qty 10

## 2018-08-22 MED ORDER — ATROPINE SULFATE 1 MG/ML IJ SOLN
0.5000 mg | Freq: Once | INTRAMUSCULAR | Status: AC | PRN
  Administered 2018-08-22: 0.5 mg via INTRAVENOUS

## 2018-08-22 MED ORDER — POTASSIUM CHLORIDE CRYS ER 20 MEQ PO TBCR
20.0000 meq | EXTENDED_RELEASE_TABLET | Freq: Once | ORAL | Status: AC
  Administered 2018-08-22: 20 meq via ORAL

## 2018-08-22 MED ORDER — POTASSIUM CHLORIDE CRYS ER 20 MEQ PO TBCR: 20.0000 meq | EXTENDED_RELEASE_TABLET | Freq: Two times a day (BID) | ORAL | 1 refills | Status: DC

## 2018-08-22 MED ORDER — IRINOTECAN HCL CHEMO INJECTION 100 MG/5ML
180.0000 mg/m2 | Freq: Once | INTRAVENOUS | Status: AC
  Administered 2018-08-22: 340 mg via INTRAVENOUS
  Filled 2018-08-22: qty 15

## 2018-08-22 NOTE — Progress Notes (Signed)
[place]   [phone] [phone]   Clinic Follow up Note   Patient Care Team: Unk Pinto, MD as PCP - General (Internal Medicine) Alphonsa Overall, MD as Consulting Physician (General Surgery) Magrinat, Virgie Dad, MD as Consulting Physician (Oncology) Thea Silversmith, MD as Consulting Physician (Radiation Oncology) Sylvan Cheese, NP as Nurse Practitioner (Hematology and Oncology) Mauri Pole, MD as Consulting Physician (Gastroenterology) Arna Snipe, RN as Oncology Nurse Navigator 08/22/2018  CHIEF COMPLAINT: f/u pancreas cancer   SUMMARY OF ONCOLOGIC HISTORY: Oncology History  Malignant neoplasm of upper-outer quadrant of right breast in female, estrogen receptor positive ([place])  11/15/2014 Mammogram   Right breast: mass warranting further evaluation   11/27/2014 Breast US   0.4 x 0.4 x 0.6 cm irregular hypoechoic mass within the right breast 10 o'clock position 7 cm from the nipple. Additionally there is a 0.7 x 0.8 x 0.8 cm taller than wide irregular hypoechoic mass with posterior acoustic shadowing within the right breast.   11/28/2014 Initial Biopsy   Right breast core needle biopsies, 2 masses: both invasive ductal carcinoma, grade 1, ER+ (100%), PR+ (85% -larger), HER2/neu negative, Ki67 5-10%.  Smaller mass PR-   11/28/2014 Clinical Stage   Stage IA: T1b N0   12/28/2014 Definitive Surgery   Right lumpectomies/SLNB: invasive ductal carcinoma, grade 2, repeat HER2/neu negative, 0/2 LN   12/28/2014 Pathologic Stage   Stage IA: npT1c pN0   12/28/2014 Oncotype testing   RS 16 (10% ROR)   02/18/2015 - 04/03/2015 Radiation Therapy    Right breast / 45 Gray at 1.8 [place] Cables per fraction x 25 fractions; right breast boost / 16 Gray at [employer] per fraction x 8 fractions   05/13/2015 -  Anti-estrogen oral therapy   Anastrozole 1 mg daily.   05/30/2015 Survivorship   SCP mailed to patient in lieu of in person visit     08/01/2018 Genetic Testing   Negative genetic testing on the common hereditary cancer panel.  The Common Hereditary Gene Panel offered by [facility] includes sequencing and/or deletion duplication testing of the following 48 genes: APC, ATM, AXIN2, BARD1, BMPR1A, BRCA1, BRCA2, BRIP1, CDH1, CDK4, CDKN2A (p14ARF), CDKN2A (p16INK4a), CHEK2, CTNNA1, DICER1, EPCAM (Deletion/duplication testing only), GREM1 (promoter region deletion/duplication testing only), KIT, MEN1, MLH1, MSH2, MSH3, MSH6, MUTYH, NBN, NF1, NHTL1, PALB2, PDGFRA, PMS2, POLD1, POLE, PTEN, RAD50, RAD51C, RAD51D, RNF43, SDHB, SDHC, SDHD, SMAD4, SMARCA4. STK11, TP53, TSC1, TSC2, and VHL.  The following genes were evaluated for sequence changes only: SDHA and HOXB13 c.251G>A variant only. The report date is August 01, 2018.   Pancreatic cancer ([place])  06/30/2018 Imaging   CT AP WO Contrast 06/30/18  IMPRESSION: 1. New common bile duct dilatation and intrahepatic bile duct dilatation. New fullness of the pancreatic head which could represent rapidly developing obstructive neoplastic process or sequela of recent localized pancreatitis. Recommend further characterization with MRCP or ERCP. 2. No other acute or significant findings. No bowel obstruction or evidence of bowel wall inflammation. No free fluid or abscess collection. No free intraperitoneal air. 3. Additional chronic/incidental findings detailed above.   07/01/2018 Procedure   ERCP by Dr Fuller Plan on 07/01/18  IMPRESSION - A single severe segmental biliary stricture was found in the lower third of the common bile duct. The stricture was malignant appearing. Brushed. - The entire biliary tree proximal to the stricture was dilated, secondary to the stricture. - Prior cholecystectomy. - A biliary sphincterotomy was performed. - One plastic stent was placed into the  common bile duct.   07/01/2018 Pathology Results   Diagnosis 07/01/18 BILE DUCT BRUSHING (SPECIMEN 1 OF 1 COLLECTED  07/01/2018) SUSPICIOUS FOR ADENOCARCINOMA. SEE COMMENT.   07/14/2018 Initial Diagnosis   Pancreatic cancer ([place])   07/14/2018 Procedure   EUS by Dr Ardis Hughs on 07/14/18 IMPRESSION PENDING    07/14/2018 Cancer Staging   Staging form: Exocrine Pancreas, AJCC 8th Edition - Clinical: Stage IB (cT2, cN0, cM0) - Signed by Truitt Merle, MD on 07/15/2018   07/21/2018 PET scan   IMPRESSION: 1. Mass in head of pancreas is intensely hypermetabolic compatible with primary pancreatic adenocarcinoma. 2. Enlarged and FDG avid porta hepatic lymph node compatible with metastatic adenopathy. 3. Extensive FDG avid liver metastases involve both lobes of liver and exhibit marked progression when compared with MRI from 07/01/2018. 4.  Aortic Atherosclerosis (ICD10-I70.0).   07/26/2018 -  Chemotherapy   FOLFIRINOX q2weeks starting 07/26/18     CURRENT THERAPY: FOLFIRINOX q2 weeks, starting 07/26/18   INTERVAL HISTORY: Ms. Demetrius returns for f/u and treatment as scheduled. She completed cycle 2 on 08/08/18 and udenyca with pump d/c. During chemo infusion, after oxaliplatin while irinotecan/leucovorin infusing, she developed hoarseness, tongue swelling, felt like "throat was closing," mild difficulty swallowing, and became "white as a sheet." She received IV benadryl and pepcid, most symptoms resolved before leaving infusion room. horseness resolved later that evening. She denies dyspnea or chest pain at the time. She feels well for 2 days after treatment. Then she becomes moderately fatigued with low activity and near constant nausea for almost 2 weeks. She vomited 2-3 times. She only has 2 good days before next chemo. Takes zofran once daily, compazine, and phenergan. Po intake is reduced. She gets fluids week after treatment but might need them more often. Denies mucositis, constipation, diarrhea. BMs are soft, 1-2 per day. Denies bleeding. Denies fever, chills, cough, chest pain, dyspnea, leg edema, or neuropathy. She would  like a chemo break in September and possibly over Christmas.    MEDICAL HISTORY:  Past Medical History:  Diagnosis Date   Allergy    SEASONAL   Anxiety    At risk for difficult insertion of breathing tube    Breast cancer of upper-outer quadrant of right female breast ([place]) 12/03/2014   Common bile duct (CBD) obstruction    Difficult intubation pt has large tonsils,   Family history of breast cancer    GERD (gastroesophageal reflux disease)    hx - resolved with lap band surgery   H/O hiatal hernia    resolved with band surgery   H/O laparoscopic adjustable gastric banding 2010   History of kidney stones 02/2018   Hyperlipidemia    Hypertension    Hypokalemia    Insomnia    tx with zanax   Obesity    Pancreatic mass    PCOS (polycystic ovarian syndrome)    Personal history of radiation therapy    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) 12/14/2012   Type II or unspecified type diabetes mellitus without mention of complication, not stated as uncontrolled 12/14/2012   no meds, diet controlled   Wears contact lenses    Wears glasses     SURGICAL HISTORY: Past Surgical History:  Procedure Laterality Date   BACK SURGERY     cervical   BILIARY STENT PLACEMENT N/A 07/01/2018   Procedure: BILIARY STENT PLACEMENT;  Surgeon: Ladene Artist, MD;  Location: [facility];  Service: Endoscopy;  Laterality: N/A;  cbd brushing   BREAST BIOPSY  BREAST LUMPECTOMY Right 2016   BREAST LUMPECTOMY WITH NEEDLE LOCALIZATION AND AXILLARY SENTINEL LYMPH NODE BX Right 12/28/2014   Procedure: RIGHT BREAST LUMPECTOMY WITH TWO (2) NEEDLE LOCALIZATION AND AXILLARY SENTINEL LYMPH NODE BX;  Surgeon: Alphonsa Overall, MD;  Location: [facility];  Service: General;  Laterality: Right;   CESAREAN SECTION     x 1   CHOLECYSTECTOMY     COLONOSCOPY  09/29/2004   normal   CYSTOSCOPY WITH RETROGRADE PYELOGRAM, URETEROSCOPY AND STENT PLACEMENT Right 03/24/2018    Procedure: CYSTOSCOPY WITH RETROGRADE PYELOGRAM, URETEROSCOPY AND STENT PLACEMENT;  Surgeon: Franchot Gallo, MD;  Location: [facility];  Service: Urology;  Laterality: Right;  1 HR   DIAGNOSTIC LAPAROSCOPY     ERCP N/A 07/01/2018   Procedure: ENDOSCOPIC RETROGRADE CHOLANGIOPANCREATOGRAPHY (ERCP);  Surgeon: Ladene Artist, MD;  Location: [facility];  Service: Endoscopy;  Laterality: N/A;   ESOPHAGOGASTRODUODENOSCOPY (EGD) WITH PROPOFOL N/A 07/14/2018   Procedure: ESOPHAGOGASTRODUODENOSCOPY (EGD) WITH PROPOFOL;  Surgeon: Milus Banister, MD;  Location: [facility];  Service: Endoscopy;  Laterality: N/A;   EUS N/A 07/14/2018   Procedure: UPPER ENDOSCOPIC ULTRASOUND (EUS) RADIAL;  Surgeon: Milus Banister, MD;  Location: [facility];  Service: Endoscopy;  Laterality: N/A;   FINE NEEDLE ASPIRATION  07/14/2018   Procedure: FINE NEEDLE ASPIRATION (FNA) RADIAL;  Surgeon: Milus Banister, MD;  Location: [facility];  Service: Endoscopy;;   HERNIA REPAIR  2008   IR IMAGING GUIDED PORT INSERTION  07/20/2018   LAPAROSCOPIC APPENDECTOMY N/A 08/06/2012   Procedure: APPENDECTOMY LAPAROSCOPIC;  Surgeon: Earnstine Regal, MD;  Location: [facility];  Service: General;  Laterality: N/A;   LAPAROSCOPIC GASTRIC BANDING WITH HIATAL HERNIA REPAIR N/A Nov 2008   SPHINCTEROTOMY  07/01/2018   Procedure: SPHINCTEROTOMY;  Surgeon: Ladene Artist, MD;  Location: [facility];  Service: Endoscopy;;   TUBAL LIGATION      I have reviewed the social history and family history with the patient and they are unchanged from previous note.  ALLERGIES:  is allergic to oxaliplatin; codeine; lipitor [atorvastatin]; and ace inhibitors.  MEDICATIONS:  Current Outpatient Medications  Medication Sig Dispense Refill   acetaminophen (TYLENOL) 500 MG tablet Take 1,000 mg by mouth 2 (two) times a day.     anastrozole (ARIMIDEX) 1 MG tablet TAKE 1 TABLET BY MOUTH EVERY DAY (Patient taking differently: Take 1 mg  by mouth daily. ) 90 tablet 3   aspirin EC 81 MG tablet Take 81 mg by mouth daily.     atenolol (TENORMIN) 100 MG tablet TAKE 1 TABLET BY MOUTH EVERY DAY (Patient taking differently: Take 50 mg by mouth daily after supper. ) 90 tablet 3   Cholecalciferol (VITAMIN D3) 5000 UNITS CAPS Take by mouth daily after supper.      lidocaine (XYLOCAINE) 2 % solution Use as directed 5 mLs in the mouth or throat every 3 (three) hours as needed for mouth pain. 200 mL 1   lidocaine-prilocaine (EMLA) cream Apply to affected area once 30 g 3   magic mouthwash SOLN Take 5 mLs by mouth 4 (four) times daily as needed for mouth pain. 240 mL 1   ondansetron (ZOFRAN) 8 MG tablet Take 1 tablet (8 mg total) by mouth 2 (two) times daily as needed. Start on day 3 after chemotherapy. 30 tablet 1   prochlorperazine (COMPAZINE) 10 MG tablet Take 1 tablet (10 mg total) by mouth every 6 (six) hours as needed (Nausea or vomiting). 30 tablet 1  promethazine (PHENERGAN) 25 MG tablet Take 1/2-1 tab every 6 hours as needed for nausea. 60 tablet 3   rosuvastatin (CRESTOR) 10 MG tablet      traMADol (ULTRAM) 50 MG tablet Take 1 tablet (50 mg total) by mouth every 6 (six) hours as needed. 30 tablet 0   traZODone (DESYREL) 150 MG tablet Take 1/2 to 1 tablet 1 to 2 hours before Bedtime (Patient taking differently: Take 150 mg by mouth. ) 90 tablet 3   verapamil (CALAN) 80 MG tablet Take 1 tablet 2 x /day with meal for BP (Patient taking differently: Take 80 mg by mouth 2 (two) times daily with a meal. ) 180 tablet 3   Zinc 50 MG TABS Take 50 mg by mouth daily after supper.     potassium chloride SA (K-DUR) 20 MEQ tablet Take 1 tablet (20 mEq total) by mouth 2 (two) times daily. 60 tablet 1   Current Facility-Administered Medications  Medication Dose Route Frequency Provider Last Rate Last Dose   potassium chloride SA (K-DUR) CR tablet 20 mEq  20 mEq Oral Once Alla Feeling, NP       Facility-Administered Medications  Ordered in Other Visits  Medication Dose Route Frequency Provider Last Rate Last Dose   0.9 %  sodium chloride infusion   Intravenous Once Truitt Merle, MD       fluorouracil (ADRUCIL) 4,650 mg in sodium chloride 0.9 % 57 mL chemo infusion  2,400 mg/m2 (Treatment Plan Recorded) Intravenous 1 day or 1 dose Truitt Merle, MD       fosaprepitant (EMEND) 150 mg, dexamethasone (DECADRON) 12 mg in sodium chloride 0.9 % 145 mL IVPB   Intravenous Once Truitt Merle, MD       irinotecan (CAMPTOSAR) 340 mg in dextrose 5 % 500 mL chemo infusion  180 mg/m2 (Treatment Plan Recorded) Intravenous Once Truitt Merle, MD       leucovorin 772 mg in dextrose 5 % 250 mL infusion  400 mg/m2 (Treatment Plan Recorded) Intravenous Once Truitt Merle, MD       palonosetron (ALOXI) injection 0.25 mg  0.25 mg Intravenous Once Truitt Merle, MD        PHYSICAL EXAMINATION: ECOG PERFORMANCE STATUS: 2 - Symptomatic, <50% confined to bed  Vitals:   08/22/18 1118  BP: 128/70  Pulse: 83  Resp: 18  Temp: 98.7 F (37.1 C)  SpO2: 97%   Filed Weights   08/22/18 1118  Weight: 173 lb 14.4 oz (78.9 kg)    GENERAL:alert, no distress and comfortable SKIN: no rash  EYES:  sclera clear LUNGS: respirations even and unlabored  HEART:  no lower extremity edema ABDOMEN: abdomen soft, non-tender  Musculoskeletal:no cyanosis of digits  NEURO: alert & oriented x 3 with fluent speech, normal gait  PAC without erythema   LABORATORY DATA:  I have reviewed the data as listed CBC Latest Ref Rng & Units 08/22/2018 08/08/2018 08/02/2018  WBC 4.0 - 10.5 K/uL 22.0(H) 9.6 5.3  Hemoglobin 12.0 - 15.0 g/dL 12.4 11.4(L) 12.4  Hematocrit 36.0 - 46.0 % 36.9 34.1(L) 37.2  Platelets 150 - 400 K/uL 251 221 204     CMP Latest Ref Rng & Units 08/22/2018 08/08/2018 08/02/2018  Glucose 70 - 99 mg/dL 113(H) 123(H) 130(H)  BUN 8 - 23 mg/dL 12 11 20   Creatinine 0.44 - 1.00 mg/dL 0.83 0.77 0.79  Sodium 135 - 145 mmol/L 141 139 138  Potassium 3.5 - 5.1 mmol/L  3.0(LL) 3.3(L) 3.7  Chloride 98 -  111 mmol/L 102 103 100  CO2 22 - 32 mmol/L 26 27 28   Calcium 8.9 - 10.3 mg/dL 8.8(L) 8.7(L) 9.1  Total Protein 6.5 - 8.1 g/dL 6.0(L) 6.0(L) 6.6  Total Bilirubin 0.3 - 1.2 mg/dL 0.4 0.4 0.7  Alkaline Phos 38 - 126 U/L 131(H) 102 142(H)  AST 15 - 41 U/L 21 18 11(L)  ALT 0 - 44 U/L 19 13 10       RADIOGRAPHIC STUDIES: I have personally reviewed the radiological images as listed and agreed with the findings in the report. No results found.   ASSESSMENT & PLAN: Taylor Ferrufino Priceis a 68 y.o.femalewith   1. Adenocarcinoma of Pancreatic Head, High grade, cT2NxM1 withmetastasisto liver  -She was newly diagnosed in 06/2018.She was found to havea 2.4cmmass in head of pancreas, initial ERCP with stent placement,EUSand FNA biopsy of her pancreatic massfrom 7/2/20confirmedhigh grade adenocarcinoma.Herpancreatic mass on the MRI and the EUS was borderline resectable, due to the invasion of portal vein. -Dr. Burr Medico previously reviewed staging PETPET from 7/9/20whichshowssignificant increase andhypermetabolicmultiple liver lesions and porta hepatic LN which is compatible with liver metastasis. MD did not feel biopsy was necessary -Dr. Burr Medico recommended systemic chemotherapy withFOLFIRINOX q2weeks. -s/p cycle 1 on 07/26/18, with Udenyca - she tolerated fairly well with fatigue, nausea, and mucositis. She required IVF one week after treatment. She was able to recover moderately well -s/p cycle 2, 5FU was omitted for mucositis. She experienced sensation of throat closing and hoarseness after oxaliplatin. She continued to experience moderate fatigue and delayed n/v lasting almost 2 weeks.  -she has recovered fairly well today. Labs reviewed, WBC elevated likely related to [facility]. CA 19-9 after cycle 1 is 101, pending today -Due to questionable oxaliplatin neurotoxicity vs allergic reaction and low tolerance to intensive chemo, will omit oxaliplatin. She will  proceed with FOLFIRI. She is requesting treatment break in Sept/October to recover, spend time with her husband and the RV they purchased earlier this year. Plan to restage in September before break. I reviewed the plan with Dr. Burr Medico -she will return for IVF on day 3 with pump d/c and next week -f/u in 2 weeks with cycle 4.  2. Grade II mucositis -developed mucositis with ulcers and pain, limiting po intake, after cycle 1 -she recovered today, uses magic mouthwash and lidocaine PRN -will omit 5FU bolus from cycle 2 -resolved   3.EpigastricAbdominal pain, secondary to #1  -Improved since stent placed on 07/01/18 -pain is controlled, she takes tramadol AM and before bed -she denies pain today  4.Obstructive jaundice,s/p stent placement on 07/01/18  -LFTs have improved after stent placement.  -resolved  5. Low appetite, Weight loss, Nausea  -Years ago she had gastric band placed. She is interested in having it removed.  -She lost 32 pounds over 5 months secondary to her recent cancer.  -she is losing weight overall, has prolonged nausea with some vomiting after chemo  -she will increase zofran to q8 hours and continue alternating with compazine and phenergan -she is not tolerating intensive chemo very well, will d/c oxaliplatin   6. H/o of right breast cancer, RS 16, ER/PR +, HER2- -Managed by Med Onc Dr [name]  -Diagnosed in 11/2014. S/p right lumpectomy and adjuvant radiation.  -She has been on Anastrozole since 05/2015. She is tolerating well. Will continue   7. Genetic testing  -negative on 08/01/18, no pathogenic mutations  8. HTN, DM -DM is mostly controlled, she is off medication -On Verapamil and atenolol -stable  9. Hypokalemia -secondary to  decreased po intake, n/v, and BM -she has intermittent vomiting after chemo and 1-2 BM per day -K 3.0 today, will give 20 mEq PO in infusion room, then begin 20 mEq BID  9. Goals of care -she is aware her cancer is  inoperable and incurable. We reviewed it is still treatable. - We reviewed the goal of her treatment is palliative, to improve symptoms and prolong her life. I reviewed the importance of her quality of life.  -She understands she will be on long term treatment as long as she can tolerate it and it controls her disease. I explained without treatment, her chemo will progress. -We discussed her treatment plan will evolve based on her response to chemo which we will assess with periodic restaging scans. Also following CA 19-9 -she wants to spend time with her husband and go on RV trip in Sept or October. She also does not want chemo over Christmas but will do so if needed.  -will continue to frequently assess her goals of care.    PLAN: -Labs reviewed -K 3.0; start oral K supplement. Will receive 20 mEq po in infusion, then begin BID dosing. Rx sent  -Discontinue oxaliplatin, proceed with FOLFIRI from cycle 3 -IVF with pump d/c and next week -increase zofran, up to q8 PRN -F/u in 2 weeks with cycle 4 -Restage in 09/2018 before requested treatment break   All questions were answered. The patient knows to call the clinic with any problems, questions or concerns. No barriers to learning was detected. I spent 20 minutes counseling the patient face to face. The total time spent in the appointment was 25 minutes and more than 50% was on counseling and review of test results     Alla Feeling, NP 08/22/18

## 2018-08-22 NOTE — Patient Instructions (Signed)
[facility] Discharge Instructions for Patients Receiving Chemotherapy  Today you received the following chemotherapy agents :  Irinotecan, Leucovorin, Fluorouracil.  To help prevent nausea and vomiting after your treatment, we encourage you to take your nausea medication as prescribed.   If you develop nausea and vomiting that is not controlled by your nausea medication, call the clinic.   BELOW ARE SYMPTOMS THAT SHOULD BE REPORTED IMMEDIATELY:  *FEVER GREATER THAN 100.5 F  *CHILLS WITH OR WITHOUT FEVER  NAUSEA AND VOMITING THAT IS NOT CONTROLLED WITH YOUR NAUSEA MEDICATION  *UNUSUAL SHORTNESS OF BREATH  *UNUSUAL BRUISING OR BLEEDING  TENDERNESS IN MOUTH AND THROAT WITH OR WITHOUT PRESENCE OF ULCERS  *URINARY PROBLEMS  *BOWEL PROBLEMS  UNUSUAL RASH Items with * indicate a potential emergency and should be followed up as soon as possible.  Feel free to call the clinic should you have any questions or concerns. The clinic phone number is [phone].  Please show the CHEMO ALERT CARD at check-in to the Emergency Department and triage nurse.   

## 2018-08-22 NOTE — Telephone Encounter (Signed)
Received critical value from lab at 1144.  Potassium 3.0. Communicated with desk LPN Santiago Glad for NP Lacie at 1146.

## 2018-08-23 ENCOUNTER — Telehealth: Payer: Self-pay | Admitting: *Deleted

## 2018-08-23 ENCOUNTER — Telehealth: Payer: Self-pay | Admitting: Nurse Practitioner

## 2018-08-23 LAB — CANCER ANTIGEN 19-9: CA 19-9: 52 U/mL — ABNORMAL HIGH (ref 0–35)

## 2018-08-23 NOTE — Telephone Encounter (Signed)
Called pt & informed of better CA19.9 & no need for udenyca shot tomorrow since oxaliplatin held & WBC up yest.  She states she is taking KCL supplement & has been working on K+ rich diet for a while.

## 2018-08-23 NOTE — Telephone Encounter (Signed)
-----   Message from Truitt Merle, MD sent at 08/23/2018  9:22 AM EDT ----- Please let pt know her tumor marker CA19.9 has dropped by half since a month ago, which is a good sign, she is probably responding to chemo. Please also let her know that I will cancel Udenyca on day 3 (tomorrow) because we have held her oxaliplatin and her WBC was high yesterday. Remind her to take KCL Lacie prescribed yesterday and have K rich diet, thanks  Truitt Merle  08/23/2018

## 2018-08-23 NOTE — Telephone Encounter (Signed)
Scheduled appt per 8/10 los.   Patient will get a print after her fluids on 8/12.

## 2018-08-24 ENCOUNTER — Other Ambulatory Visit: Payer: Self-pay | Admitting: Nurse Practitioner

## 2018-08-24 ENCOUNTER — Inpatient Hospital Stay: Payer: Medicare Other

## 2018-08-24 ENCOUNTER — Inpatient Hospital Stay (HOSPITAL_BASED_OUTPATIENT_CLINIC_OR_DEPARTMENT_OTHER): Payer: Medicare Other | Admitting: Medical

## 2018-08-24 ENCOUNTER — Other Ambulatory Visit: Payer: Self-pay

## 2018-08-24 VITALS — BP 138/72 | HR 79 | Temp 98.4°F | Resp 18

## 2018-08-24 DIAGNOSIS — E876 Hypokalemia: Secondary | ICD-10-CM | POA: Diagnosis not present

## 2018-08-24 DIAGNOSIS — B370 Candidal stomatitis: Secondary | ICD-10-CM

## 2018-08-24 DIAGNOSIS — R1013 Epigastric pain: Secondary | ICD-10-CM | POA: Diagnosis not present

## 2018-08-24 DIAGNOSIS — C787 Secondary malignant neoplasm of liver and intrahepatic bile duct: Secondary | ICD-10-CM | POA: Diagnosis not present

## 2018-08-24 DIAGNOSIS — B3781 Candidal esophagitis: Secondary | ICD-10-CM

## 2018-08-24 DIAGNOSIS — K831 Obstruction of bile duct: Secondary | ICD-10-CM | POA: Diagnosis not present

## 2018-08-24 DIAGNOSIS — C250 Malignant neoplasm of head of pancreas: Secondary | ICD-10-CM | POA: Diagnosis not present

## 2018-08-24 DIAGNOSIS — E119 Type 2 diabetes mellitus without complications: Secondary | ICD-10-CM | POA: Diagnosis not present

## 2018-08-24 DIAGNOSIS — K219 Gastro-esophageal reflux disease without esophagitis: Secondary | ICD-10-CM | POA: Diagnosis not present

## 2018-08-24 DIAGNOSIS — I700 Atherosclerosis of aorta: Secondary | ICD-10-CM | POA: Diagnosis not present

## 2018-08-24 DIAGNOSIS — R490 Dysphonia: Secondary | ICD-10-CM | POA: Diagnosis not present

## 2018-08-24 DIAGNOSIS — R112 Nausea with vomiting, unspecified: Secondary | ICD-10-CM | POA: Diagnosis not present

## 2018-08-24 DIAGNOSIS — Z170 Estrogen receptor positive status [ER+]: Secondary | ICD-10-CM | POA: Diagnosis not present

## 2018-08-24 DIAGNOSIS — Z79811 Long term (current) use of aromatase inhibitors: Secondary | ICD-10-CM | POA: Diagnosis not present

## 2018-08-24 DIAGNOSIS — I10 Essential (primary) hypertension: Secondary | ICD-10-CM | POA: Diagnosis not present

## 2018-08-24 DIAGNOSIS — Z923 Personal history of irradiation: Secondary | ICD-10-CM | POA: Diagnosis not present

## 2018-08-24 DIAGNOSIS — Z5111 Encounter for antineoplastic chemotherapy: Secondary | ICD-10-CM | POA: Diagnosis not present

## 2018-08-24 DIAGNOSIS — C50411 Malignant neoplasm of upper-outer quadrant of right female breast: Secondary | ICD-10-CM | POA: Diagnosis not present

## 2018-08-24 DIAGNOSIS — R5383 Other fatigue: Secondary | ICD-10-CM | POA: Diagnosis not present

## 2018-08-24 DIAGNOSIS — K1230 Oral mucositis (ulcerative), unspecified: Secondary | ICD-10-CM | POA: Diagnosis not present

## 2018-08-24 DIAGNOSIS — E785 Hyperlipidemia, unspecified: Secondary | ICD-10-CM | POA: Diagnosis not present

## 2018-08-24 DIAGNOSIS — Z95828 Presence of other vascular implants and grafts: Secondary | ICD-10-CM

## 2018-08-24 MED ORDER — SODIUM CHLORIDE 0.9% FLUSH
10.0000 mL | INTRAVENOUS | Status: DC | PRN
  Administered 2018-08-24: 10 mL
  Filled 2018-08-24: qty 10

## 2018-08-24 MED ORDER — FLUCONAZOLE 100 MG PO TABS: 100.0000 mg | ORAL_TABLET | Freq: Every day | ORAL | 0 refills | Status: DC

## 2018-08-24 MED ORDER — SODIUM CHLORIDE 0.9 % IV SOLN
INTRAVENOUS | Status: DC
  Administered 2018-08-24: 13:00:00 via INTRAVENOUS
  Filled 2018-08-24 (×2): qty 250

## 2018-08-24 MED ORDER — HEPARIN SOD (PORK) LOCK FLUSH 100 UNIT/ML IV SOLN
500.0000 [IU] | Freq: Once | INTRAVENOUS | Status: AC | PRN
  Administered 2018-08-24: 500 [IU]
  Filled 2018-08-24: qty 5

## 2018-08-24 NOTE — Patient Instructions (Signed)
Rehydration, Adult Rehydration is the replacement of body fluids and salts and minerals (electrolytes) that are lost during dehydration. Dehydration is when there is not enough fluid or water in the body. This happens when you lose more fluids than you take in. Common causes of dehydration include:  Vomiting.  Diarrhea.  Excessive sweating, such as from heat exposure or exercise.  Taking medicines that cause the body to lose excess fluid (diuretics).  Impaired kidney function.  Not drinking enough fluid.  Certain illnesses or infections.  Certain poorly controlled long-term (chronic) illnesses, such as diabetes, heart disease, and kidney disease.  Symptoms of mild dehydration may include thirst, dry lips and mouth, dry skin, and dizziness. Symptoms of severe dehydration may include increased heart rate, confusion, fainting, and not urinating. You can rehydrate by drinking certain fluids or getting fluids through an IV tube, as told by your health care provider. What are the risks? Generally, rehydration is safe. However, one problem that can happen is taking in too much fluid (overhydration). This is rare. If overhydration happens, it can cause an electrolyte imbalance, kidney failure, or a decrease in salt (sodium) levels in the body. How to rehydrate Follow instructions from your health care provider for rehydration. The kind of fluid you should drink and the amount you should drink depend on your condition.  If directed by your health care provider, drink an oral rehydration solution (ORS). This is a drink designed to treat dehydration that is found in pharmacies and retail stores. ? Make an ORS by following instructions on the package. ? Start by drinking small amounts, about  cup (120 mL) every 5-10 minutes. ? Slowly increase how much you drink until you have taken the amount recommended by your health care provider.  Drink enough clear fluids to keep your urine clear or pale  yellow. If you were instructed to drink an ORS, finish the ORS first, then start slowly drinking other clear fluids. Drink fluids such as: ? Water. Do not drink only water. Doing that can lead to having too little sodium in your body (hyponatremia). ? Ice chips. ? Fruit juice that you have added water to (diluted juice). ? Low-calorie sports drinks.  If you are severely dehydrated, your health care provider may recommend that you receive fluids through an IV tube in the hospital.  Do not take sodium tablets. Doing that can lead to the condition of having too much sodium in your body (hypernatremia). Eating while you rehydrate Follow instructions from your health care provider about what to eat while you rehydrate. Your health care provider may recommend that you slowly begin eating regular foods in small amounts.  Eat foods that contain a healthy balance of electrolytes, such as bananas, oranges, potatoes, tomatoes, and spinach.  Avoid foods that are greasy or contain a lot of fat or sugar.  In some cases, you may get nutrition through a feeding tube that is passed through your nose and into your stomach (nasogastric tube, or NG tube). This may be done if you have uncontrolled vomiting or diarrhea. Beverages to avoid Certain beverages may make dehydration worse. While you rehydrate, avoid:  Alcohol.  Caffeine.  Drinks that contain a lot of sugar. These include: ? High-calorie sports drinks. ? Fruit juice that is not diluted. ? Soda.  Check nutrition labels to see how much sugar or caffeine a beverage contains. Signs of dehydration recovery You may be recovering from dehydration if:  You are urinating more often than before you started   rehydrating.  Your urine is clear or pale yellow.  Your energy level improves.  You vomit less frequently.  You have diarrhea less frequently.  Your appetite improves or returns to normal.  You feel less dizzy or less light-headed.  Your  skin tone and color start to look more normal. Contact a health care provider if:  You continue to have symptoms of mild dehydration, such as: ? Thirst. ? Dry lips. ? Slightly dry mouth. ? Dry, warm skin. ? Dizziness.  You continue to vomit or have diarrhea. Get help right away if:  You have symptoms of dehydration that get worse.  You feel: ? Confused. ? Weak. ? Like you are going to faint.  You have not urinated in 6-8 hours.  You have very dark urine.  You have trouble breathing.  Your heart rate while sitting still is over 100 beats a minute.  You cannot drink fluids without vomiting.  You have vomiting or diarrhea that: ? Gets worse. ? Does not go away.  You have a fever. This information is not intended to replace advice given to you by your health care provider. Make sure you discuss any questions you have with your health care provider. Document Released: 03/23/2011 Document Revised: 12/11/2016 Document Reviewed: 02/22/2015 Elsevier Patient Education  2020 [employer]  Coronavirus (COVID-19) Are you at risk?  Are you at risk for the Coronavirus (COVID-19)?  To be considered HIGH RISK for Coronavirus (COVID-19), you have to meet the following criteria:  . Traveled to [place], [place], [place], [place] or [place]; or in the [place] to [place], [place], [place], or [place]; and have fever, cough, and shortness of breath within the last 2 weeks of travel OR . Been in close contact with a person diagnosed with COVID-19 within the last 2 weeks and have fever, cough, and shortness of breath . IF YOU DO NOT MEET THESE CRITERIA, YOU ARE CONSIDERED LOW RISK FOR COVID-19.  What to do if you are HIGH RISK for COVID-19?  . If you are having a medical emergency, call 911. . Seek medical care right away. Before you go to a doctor's office, urgent care or emergency department, call ahead and tell them about your recent travel, contact with someone diagnosed  with COVID-19, and your symptoms. You should receive instructions from your physician's office regarding next steps of care.  . When you arrive at healthcare provider, tell the healthcare staff immediately you have returned from visiting [place], [place], [place], [place] or [place]; or traveled in the [place] to [place], [place], [place], or [place]; in the last two weeks or you have been in close contact with a person diagnosed with COVID-19 in the last 2 weeks.   . Tell the health care staff about your symptoms: fever, cough and shortness of breath. . After you have been seen by a medical provider, you will be either: o Tested for (COVID-19) and discharged home on quarantine except to seek medical care if symptoms worsen, and asked to  - Stay home and avoid contact with others until you get your results (4-5 days)  - Avoid travel on public transportation if possible (such as bus, train, or airplane) or o Sent to the Emergency Department by EMS for evaluation, COVID-19 testing, and possible admission depending on your condition and test results.  What to do if you are LOW RISK for COVID-19?  Reduce your risk of any infection by using the same precautions   used for avoiding the common cold or flu:  Marland Kitchen Wash your hands often with soap and warm water for at least 20 seconds.  If soap and water are not readily available, use an alcohol-based hand sanitizer with at least 60% alcohol.  . If coughing or sneezing, cover your mouth and nose by coughing or sneezing into the elbow areas of your shirt or coat, into a tissue or into your sleeve (not your hands). . Avoid shaking hands with others and consider head nods or verbal greetings only. . Avoid touching your eyes, nose, or mouth with unwashed hands.  . Avoid close contact with people who are sick. . Avoid places or events with large numbers of people in one location, like concerts or sporting events. . Carefully consider travel plans you have  or are making. . If you are planning any travel outside or inside the [place], visit the CDC's Travelers' Health webpage for the latest health notices. . If you have some symptoms but not all symptoms, continue to monitor at home and seek medical attention if your symptoms worsen. . If you are having a medical emergency, call 911.   [place] / e-Visit: [email or web address]         [facility]: [phone]  [facility]: [phone]                   [facility]: [phone]   Oral Thrush, Adult  Oral thrush is an infection in your mouth and throat. It causes white patches on your tongue and in your mouth. Follow these instructions at home: Helping with soreness   To lessen your pain: ? Drink cold liquids, like water and iced tea. ? Eat frozen ice pops or frozen juices. ? Eat foods that are easy to swallow, like gelatin and ice cream. ? Drink from a straw if the patches in your mouth are painful. General instructions  Take or use over-the-counter and prescription medicines only as told by your doctor. Medicine for oral thrush may be something to swallow, or it may be something to put on the infected area.  Eat plain yogurt that has live cultures in it. Read the label to make sure.  If you wear dentures: ? Take out your dentures before you go to bed. ? Brush them well. ? Soak them in a denture cleaner.  Rinse your mouth with warm salt-water many times a day. To make the salt-water mixture, completely dissolve 1/2-1 teaspoon of salt in 1 cup of warm water. Contact a doctor if:  Your problems are getting worse.  Your problems do not get better in less than 7 days with treatment.  Your infection is spreading. This may show as white patches on the skin outside of your mouth.  You are nursing your baby and you have redness and pain in the  nipples. This information is not intended to replace advice given to you by your health care provider. Make sure you discuss any questions you have with your health care provider. Document Released: 03/25/2009 Document Revised: 04/02/2017 Document Reviewed: 09/23/2015 Elsevier Patient Education  2020 [employer].

## 2018-08-24 NOTE — Progress Notes (Signed)
Sore throat x 24 hours. White spots present on posterior throat .

## 2018-08-25 NOTE — Progress Notes (Signed)
Ms. Lehew was seen in infusion as she was being treated today. She reported sore, white plagues in her posterior throat. She has used MMW without benefit. She was given a prescription for Diflucan 100 mg PO BID.  Sandi Mealy, MHS, PA-C Physician Assistant

## 2018-08-26 ENCOUNTER — Other Ambulatory Visit: Payer: Self-pay | Admitting: Hematology

## 2018-08-26 DIAGNOSIS — C250 Malignant neoplasm of head of pancreas: Secondary | ICD-10-CM | POA: Diagnosis not present

## 2018-08-26 MED ORDER — TRAMADOL HCL 50 MG PO TABS: 50.0000 mg | ORAL_TABLET | Freq: Four times a day (QID) | ORAL | 0 refills | Status: DC | PRN

## 2018-08-29 ENCOUNTER — Other Ambulatory Visit: Payer: Self-pay | Admitting: Physician Assistant

## 2018-08-29 ENCOUNTER — Inpatient Hospital Stay: Payer: Medicare Other

## 2018-08-29 ENCOUNTER — Other Ambulatory Visit: Payer: Self-pay

## 2018-08-29 VITALS — BP 109/69 | HR 81 | Temp 98.2°F | Resp 16

## 2018-08-29 DIAGNOSIS — K831 Obstruction of bile duct: Secondary | ICD-10-CM | POA: Diagnosis not present

## 2018-08-29 DIAGNOSIS — Z170 Estrogen receptor positive status [ER+]: Secondary | ICD-10-CM | POA: Diagnosis not present

## 2018-08-29 DIAGNOSIS — I700 Atherosclerosis of aorta: Secondary | ICD-10-CM | POA: Diagnosis not present

## 2018-08-29 DIAGNOSIS — Z923 Personal history of irradiation: Secondary | ICD-10-CM | POA: Diagnosis not present

## 2018-08-29 DIAGNOSIS — E119 Type 2 diabetes mellitus without complications: Secondary | ICD-10-CM | POA: Diagnosis not present

## 2018-08-29 DIAGNOSIS — C250 Malignant neoplasm of head of pancreas: Secondary | ICD-10-CM

## 2018-08-29 DIAGNOSIS — Z5111 Encounter for antineoplastic chemotherapy: Secondary | ICD-10-CM | POA: Diagnosis not present

## 2018-08-29 DIAGNOSIS — Z79811 Long term (current) use of aromatase inhibitors: Secondary | ICD-10-CM | POA: Diagnosis not present

## 2018-08-29 DIAGNOSIS — B370 Candidal stomatitis: Secondary | ICD-10-CM | POA: Diagnosis not present

## 2018-08-29 DIAGNOSIS — R490 Dysphonia: Secondary | ICD-10-CM | POA: Diagnosis not present

## 2018-08-29 DIAGNOSIS — Z95828 Presence of other vascular implants and grafts: Secondary | ICD-10-CM

## 2018-08-29 DIAGNOSIS — I10 Essential (primary) hypertension: Secondary | ICD-10-CM | POA: Diagnosis not present

## 2018-08-29 DIAGNOSIS — E785 Hyperlipidemia, unspecified: Secondary | ICD-10-CM | POA: Diagnosis not present

## 2018-08-29 DIAGNOSIS — R5383 Other fatigue: Secondary | ICD-10-CM | POA: Diagnosis not present

## 2018-08-29 DIAGNOSIS — C50411 Malignant neoplasm of upper-outer quadrant of right female breast: Secondary | ICD-10-CM | POA: Diagnosis not present

## 2018-08-29 DIAGNOSIS — E876 Hypokalemia: Secondary | ICD-10-CM | POA: Diagnosis not present

## 2018-08-29 DIAGNOSIS — R1013 Epigastric pain: Secondary | ICD-10-CM | POA: Diagnosis not present

## 2018-08-29 DIAGNOSIS — K1230 Oral mucositis (ulcerative), unspecified: Secondary | ICD-10-CM | POA: Diagnosis not present

## 2018-08-29 DIAGNOSIS — C787 Secondary malignant neoplasm of liver and intrahepatic bile duct: Secondary | ICD-10-CM | POA: Diagnosis not present

## 2018-08-29 DIAGNOSIS — R112 Nausea with vomiting, unspecified: Secondary | ICD-10-CM | POA: Diagnosis not present

## 2018-08-29 DIAGNOSIS — K219 Gastro-esophageal reflux disease without esophagitis: Secondary | ICD-10-CM | POA: Diagnosis not present

## 2018-08-29 MED ORDER — HEPARIN SOD (PORK) LOCK FLUSH 100 UNIT/ML IV SOLN
500.0000 [IU] | Freq: Once | INTRAVENOUS | Status: AC
  Administered 2018-08-29: 11:00:00 500 [IU]
  Filled 2018-08-29: qty 5

## 2018-08-29 MED ORDER — SODIUM CHLORIDE 0.9 % IV SOLN
INTRAVENOUS | Status: DC
  Administered 2018-08-29: 09:00:00 via INTRAVENOUS
  Filled 2018-08-29 (×2): qty 250

## 2018-08-29 MED ORDER — SODIUM CHLORIDE 0.9% FLUSH
10.0000 mL | Freq: Once | INTRAVENOUS | Status: AC
  Administered 2018-08-29: 10 mL
  Filled 2018-08-29: qty 10

## 2018-08-29 NOTE — Patient Instructions (Signed)

## 2018-09-04 NOTE — Progress Notes (Signed)
[place]   Telephone:(336) [phone] Fax:([phone]) [phone]   Clinic Follow up Note   Patient Care Team: Unk Pinto, MD as PCP - General (Internal Medicine) Alphonsa Overall, MD as Consulting Physician (General Surgery) Magrinat, Virgie Dad, MD as Consulting Physician (Oncology) Thea Silversmith, MD as Consulting Physician (Radiation Oncology) Sylvan Cheese, NP as Nurse Practitioner (Hematology and Oncology) Mauri Pole, MD as Consulting Physician (Gastroenterology) Arna Snipe, RN as Oncology Nurse Navigator 09/05/2018  CHIEF COMPLAINT: f/u pancreas cancer   SUMMARY OF ONCOLOGIC HISTORY: Oncology History  Malignant neoplasm of upper-outer quadrant of right breast in female, estrogen receptor positive ([place])  11/15/2014 Mammogram   Right breast: mass warranting further evaluation   11/27/2014 Breast US   0.4 x 0.4 x 0.6 cm irregular hypoechoic mass within the right breast 10 o'clock position 7 cm from the nipple. Additionally there is a 0.7 x 0.8 x 0.8 cm taller than wide irregular hypoechoic mass with posterior acoustic shadowing within the right breast.   11/28/2014 Initial Biopsy   Right breast core needle biopsies, 2 masses: both invasive ductal carcinoma, grade 1, ER+ (100%), PR+ (85% -larger), HER2/neu negative, Ki67 5-10%.  Smaller mass PR-   11/28/2014 Clinical Stage   Stage IA: T1b N0   12/28/2014 Definitive Surgery   Right lumpectomies/SLNB: invasive ductal carcinoma, grade 2, repeat HER2/neu negative, 0/2 LN   12/28/2014 Pathologic Stage   Stage IA: npT1c pN0   12/28/2014 Oncotype testing   RS 16 (10% ROR)   02/18/2015 - 04/03/2015 Radiation Therapy    Right breast / 45 Gray at 1.8 [place] Cables per fraction x 25 fractions; right breast boost / 16 Gray at [employer] per fraction x 8 fractions   05/13/2015 -  Anti-estrogen oral therapy   Anastrozole 1 mg daily.   05/30/2015 Survivorship   SCP mailed to patient in lieu of in person visit    08/01/2018 Genetic Testing   Negative genetic testing on the common hereditary cancer panel.  The Common Hereditary Gene Panel offered by [facility] includes sequencing and/or deletion duplication testing of the following 48 genes: APC, ATM, AXIN2, BARD1, BMPR1A, BRCA1, BRCA2, BRIP1, CDH1, CDK4, CDKN2A (p14ARF), CDKN2A (p16INK4a), CHEK2, CTNNA1, DICER1, EPCAM (Deletion/duplication testing only), GREM1 (promoter region deletion/duplication testing only), KIT, MEN1, MLH1, MSH2, MSH3, MSH6, MUTYH, NBN, NF1, NHTL1, PALB2, PDGFRA, PMS2, POLD1, POLE, PTEN, RAD50, RAD51C, RAD51D, RNF43, SDHB, SDHC, SDHD, SMAD4, SMARCA4. STK11, TP53, TSC1, TSC2, and VHL.  The following genes were evaluated for sequence changes only: SDHA and HOXB13 c.251G>A variant only. The report date is August 01, 2018.   Pancreatic cancer ([place])  06/30/2018 Imaging   CT AP WO Contrast 06/30/18  IMPRESSION: 1. New common bile duct dilatation and intrahepatic bile duct dilatation. New fullness of the pancreatic head which could represent rapidly developing obstructive neoplastic process or sequela of recent localized pancreatitis. Recommend further characterization with MRCP or ERCP. 2. No other acute or significant findings. No bowel obstruction or evidence of bowel wall inflammation. No free fluid or abscess collection. No free intraperitoneal air. 3. Additional chronic/incidental findings detailed above.   07/01/2018 Procedure   ERCP by Dr Fuller Plan on 07/01/18  IMPRESSION - A single severe segmental biliary stricture was found in the lower third of the common bile duct. The stricture was malignant appearing. Brushed. - The entire biliary tree proximal to the stricture was dilated, secondary to the stricture. - Prior cholecystectomy. - A biliary sphincterotomy was performed. - One plastic stent was placed into the common  bile duct.   07/01/2018 Pathology Results   Diagnosis 07/01/18 BILE DUCT BRUSHING (SPECIMEN 1 OF 1 COLLECTED 07/01/2018)  SUSPICIOUS FOR ADENOCARCINOMA. SEE COMMENT.   07/14/2018 Initial Diagnosis   Pancreatic cancer ([place])   07/14/2018 Procedure   EUS by Dr Ardis Hughs on 07/14/18 IMPRESSION PENDING    07/14/2018 Cancer Staging   Staging form: Exocrine Pancreas, AJCC 8th Edition - Clinical: Stage IB (cT2, cN0, cM0) - Signed by Truitt Merle, MD on 07/15/2018   07/21/2018 PET scan   IMPRESSION: 1. Mass in head of pancreas is intensely hypermetabolic compatible with primary pancreatic adenocarcinoma. 2. Enlarged and FDG avid porta hepatic lymph node compatible with metastatic adenopathy. 3. Extensive FDG avid liver metastases involve both lobes of liver and exhibit marked progression when compared with MRI from 07/01/2018. 4.  Aortic Atherosclerosis (ICD10-I70.0).   07/26/2018 -  Chemotherapy   FOLFIRINOX q2weeks starting 07/26/18     CURRENT THERAPY: FOLFIRINOX q2 weeks, starting 07/26/18  INTERVAL HISTORY: Ms. Leys returns for f/u and treatment as scheduled. She completed cycle 3 FOLFIRINOX on 08/22/18. She received scheduled IVF on 8/12 with pump d/c and 08/29/18 which really helped with her nausea. She only had "heaves" twice last week after eating hamburger meat, no other n/v. Denies diarrhea. She developed oral thrush which caused burning and painful swallowing. Eventually resolved with diflucan. Denies other mucositis. Her fatigue is "bad" week after treatment then improves on the second week. She feels normal for about 4 days before next chemo. Otherwise, she denies fever, chills, cough, chest pain, dyspnea, edema, bleeding, neuropathy, or pain. She takes tramadol BID.    MEDICAL HISTORY:  Past Medical History:  Diagnosis Date  . Allergy    SEASONAL  . Anxiety   . At risk for difficult insertion of breathing tube   . Breast cancer of upper-outer quadrant of right female breast ([place]) 12/03/2014  . Common bile duct (CBD) obstruction   . 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 Family history of breast  cancer   . GERD (gastroesophageal reflux disease)    hx - resolved with lap band surgery  . H/O hiatal hernia    resolved with band surgery  . H/O laparoscopic adjustable gastric banding 2010  . History of kidney stones 02/2018  . Hyperlipidemia   . Hypertension   . Hypokalemia   . Insomnia    tx with zanax  . Obesity   . Pancreatic mass   . PCOS (polycystic ovarian syndrome)   . Personal history of radiation therapy   . 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 Type II or unspecified type diabetes mellitus without mention of complication, not stated as uncontrolled 12/14/2012   no meds, diet controlled  . Wears contact lenses   . Wears glasses     SURGICAL HISTORY: Past Surgical History:  Procedure Laterality Date  . BACK SURGERY     cervical  . BILIARY STENT PLACEMENT N/A 07/01/2018   Procedure: BILIARY STENT PLACEMENT;  Surgeon: Ladene Artist, MD;  Location: [facility];  Service: Endoscopy;  Laterality: N/A;  cbd brushing  . BREAST BIOPSY    . BREAST LUMPECTOMY Right 2016  . BREAST LUMPECTOMY WITH NEEDLE LOCALIZATION AND AXILLARY SENTINEL LYMPH NODE BX Right 12/28/2014   Procedure: RIGHT BREAST LUMPECTOMY WITH TWO (2) NEEDLE LOCALIZATION AND AXILLARY SENTINEL LYMPH NODE BX;  Surgeon: Alphonsa Overall, MD;  Location: [facility];  Service: General;  Laterality: Right;  . CESAREAN SECTION  x 1  . CHOLECYSTECTOMY    . COLONOSCOPY  09/29/2004   normal  . CYSTOSCOPY WITH RETROGRADE PYELOGRAM, URETEROSCOPY AND STENT PLACEMENT Right 03/24/2018   Procedure: CYSTOSCOPY WITH RETROGRADE PYELOGRAM, URETEROSCOPY AND STENT PLACEMENT;  Surgeon: Franchot Gallo, MD;  [facility];  Service: Urology;  Laterality: Right;  1 HR  . DIAGNOSTIC LAPAROSCOPY    . ERCP N/A 07/01/2018   Procedure: ENDOSCOPIC RETROGRADE CHOLANGIOPANCREATOGRAPHY (ERCP);  Surgeon: Ladene Artist, MD;  Location: [facility];  Service: Endoscopy;  Laterality: N/A;   . ESOPHAGOGASTRODUODENOSCOPY (EGD) WITH PROPOFOL N/A 07/14/2018   Procedure: ESOPHAGOGASTRODUODENOSCOPY (EGD) WITH PROPOFOL;  Surgeon: Milus Banister, MD;  Location: [facility];  Service: Endoscopy;  Laterality: N/A;  . EUS N/A 07/14/2018   Procedure: UPPER ENDOSCOPIC ULTRASOUND (EUS) RADIAL;  Surgeon: Milus Banister, MD;  Location: [facility];  Service: Endoscopy;  Laterality: N/A;  . FINE NEEDLE ASPIRATION  07/14/2018   Procedure: FINE NEEDLE ASPIRATION (FNA) RADIAL;  Surgeon: Milus Banister, MD;  Location: [facility];  Service: Endoscopy;;  . HERNIA REPAIR  2008  . IR IMAGING GUIDED PORT INSERTION  07/20/2018  . LAPAROSCOPIC APPENDECTOMY N/A 08/06/2012   Procedure: APPENDECTOMY LAPAROSCOPIC;  Surgeon: Earnstine Regal, MD;  Location: [facility];  Service: General;  Laterality: N/A;  . LAPAROSCOPIC GASTRIC BANDING WITH HIATAL HERNIA REPAIR N/A Nov 2008  . SPHINCTEROTOMY  07/01/2018   Procedure: SPHINCTEROTOMY;  Surgeon: Ladene Artist, MD;  Location: [facility];  Service: Endoscopy;;  . TUBAL LIGATION      I have reviewed the social history and family history with the patient and they are unchanged from previous note.  ALLERGIES:  is allergic to oxaliplatin; codeine; lipitor [atorvastatin]; and ace inhibitors.  MEDICATIONS:  Current Outpatient Medications  Medication Sig Dispense Refill  . acetaminophen (TYLENOL) 500 MG tablet Take 1,000 mg by mouth 2 (two) times a day.    . anastrozole (ARIMIDEX) 1 MG tablet TAKE 1 TABLET BY MOUTH EVERY DAY (Patient taking differently: Take 1 mg by mouth daily. ) 90 tablet 3  . aspirin EC 81 MG tablet Take 81 mg by mouth daily.    Marland Kitchen atenolol (TENORMIN) 100 MG tablet TAKE 1 TABLET BY MOUTH EVERY DAY (Patient taking differently: Take 50 mg by mouth daily after supper. ) 90 tablet 3  . Cholecalciferol (VITAMIN D3) 5000 UNITS CAPS Take by mouth daily after supper.     . fluconazole (DIFLUCAN) 100 MG tablet Take 1 tablet (100 mg total) by mouth daily. 10  tablet 0  . lidocaine (XYLOCAINE) 2 % solution Use as directed 5 mLs in the mouth or throat every 3 (three) hours as needed for mouth pain. 200 mL 1  . lidocaine-prilocaine (EMLA) cream Apply to affected area once 30 g 3  . magic mouthwash SOLN Take 5 mLs by mouth 4 (four) times daily as needed for mouth pain. 240 mL 1  . nystatin (MYCOSTATIN) 100000 UNIT/ML suspension Take 5 mLs (500,000 Units total) by mouth 3 (three) times daily. 60 mL 0  . ondansetron (ZOFRAN) 8 MG tablet Take 1 tablet (8 mg total) by mouth 2 (two) times daily as needed. Start on day 3 after chemotherapy. 30 tablet 1  . potassium chloride SA (K-DUR) 20 MEQ tablet Take 1 tablet (20 mEq total) by mouth 2 (two) times daily. 60 tablet 1  . prochlorperazine (COMPAZINE) 10 MG tablet Take 1 tablet (10 mg total) by mouth every 6 (six) hours as needed (Nausea or vomiting).  30 tablet 1  . promethazine (PHENERGAN) 25 MG tablet Take 1/2-1 tab every 6 hours as needed for nausea. 60 tablet 3  . rosuvastatin (CRESTOR) 10 MG tablet Take 1 tablet Daily for Cholesterol 90 tablet 3  . traMADol (ULTRAM) 50 MG tablet Take 1 tablet (50 mg total) by mouth every 6 (six) hours as needed. 60 tablet 0  . traZODone (DESYREL) 150 MG tablet Take 1/2 to 1 tablet 1 to 2 hours before Bedtime (Patient taking differently: Take 150 mg by mouth. ) 90 tablet 3  . verapamil (CALAN) 80 MG tablet Take 1 tablet 2 x /day with meal for BP (Patient taking differently: Take 80 mg by mouth 2 (two) times daily with a meal. ) 180 tablet 3  . Zinc 50 MG TABS Take 50 mg by mouth daily after supper.     No current facility-administered medications for this visit.    Facility-Administered Medications Ordered in Other Visits  Medication Dose Route Frequency Provider Last Rate Last Dose  . atropine injection 0.5 mg  0.5 mg Intravenous Once PRN Truitt Merle, MD      . fluorouracil (ADRUCIL) 4,650 mg in sodium chloride 0.9 % 57 mL chemo infusion  2,400 mg/m2 (Treatment Plan Recorded)  Intravenous 1 day or 1 dose Truitt Merle, MD      . heparin lock flush 100 unit/mL  500 Units Intracatheter Once PRN Truitt Merle, MD      . irinotecan (CAMPTOSAR) 340 mg in dextrose 5 % 500 mL chemo infusion  180 mg/m2 (Treatment Plan Recorded) Intravenous Once Truitt Merle, MD      . leucovorin 772 mg in dextrose 5 % 250 mL infusion  400 mg/m2 (Treatment Plan Recorded) Intravenous Once Truitt Merle, MD      . sodium chloride flush (NS) 0.9 % injection 10 mL  10 mL Intracatheter PRN Truitt Merle, MD        PHYSICAL EXAMINATION: ECOG PERFORMANCE STATUS: 1 - Symptomatic but completely ambulatory  Vitals:   09/05/18 0857  BP: 120/70  Pulse: 90  Resp: 17  Temp: 99 F (37.2 C)  SpO2: 96%   Filed Weights   09/05/18 0857  Weight: 173 lb 3.2 oz (78.6 kg)    GENERAL:alert, no distress and comfortable SKIN: no rash  EYES: sclera clear OROPHARYNX: no thrush or ulcers LUNGS: clear to auscultation with normal breathing effort HEART: regular rate & rhythm, no lower extremity edema ABDOMEN:abdomen soft, non-tender and normal bowel sounds. Gastric band noted to RUQ  Musculoskeletal:no cyanosis of digits  NEURO: alert & oriented x 3 with fluent speech, normal gait PAC without erythema  Limited exam for covid19 outbreak   LABORATORY DATA:  I have reviewed the data as listed CBC Latest Ref Rng & Units 09/05/2018 08/22/2018 08/08/2018  WBC 4.0 - 10.5 K/uL 4.0 22.0(H) 9.6  Hemoglobin 12.0 - 15.0 g/dL 11.4(L) 12.4 11.4(L)  Hematocrit 36.0 - 46.0 % 35.0(L) 36.9 34.1(L)  Platelets 150 - 400 K/uL 221 251 221     CMP Latest Ref Rng & Units 09/05/2018 08/22/2018 08/08/2018  Glucose 70 - 99 mg/dL 137(H) 113(H) 123(H)  BUN 8 - 23 mg/dL _0 Creatinine 0.44 - 1.00 mg/dL 0.74 0.83 0.77  Sodium 135 - 145 mmol/L 139 141 139  Potassium 3.5 - 5.1 mmol/L 4.2 3.0(LL) 3.3(L)  Chloride 98 - 111 mmol/L 105 102 103  CO2 22 - 32 mmol/L _1 Calcium 8.9 - 10.3 mg/dL 9.1 8.8(L) 8.7(L)  Total Protein  6.5 - 8.1 g/dL  6.0(L) 6.0(L) 6.0(L)  Total Bilirubin 0.3 - 1.2 mg/dL 0.3 0.4 0.4  Alkaline Phos 38 - 126 U/L 106 131(H) 102  AST 15 - 41 U/L _0 ALT 0 - 44 U/L _1 RADIOGRAPHIC STUDIES: I have personally reviewed the radiological images as listed and agreed with the findings in the report. No results found.   ASSESSMENT & PLAN: Elyanna Wallick Priceis a 68 y.o.femalewith   1. Adenocarcinoma of Pancreatic Head, High grade, cT2NxM1 withmetastasisto liver  -She was newly diagnosed in 06/2018.She was found to havea 2.4cmmass in head of pancreas, initial ERCP with stent placement,EUSand FNA biopsy of her pancreatic massfrom 7/2/20confirmedhigh grade adenocarcinoma.Herpancreatic mass on the MRI and the EUS was borderline resectable, due to the invasion of portal vein. -Dr. Burr Medico previously reviewed staging PETPET from 7/9/20whichshowssignificant increase andhypermetabolicmultiple liver lesions and porta hepatic LN which is compatible with liver metastasis.MD did not feel biopsy was necessary -Dr. Burr Medico recommended systemic chemotherapy withFOLFIRINOX q2weeks. -s/p cycle 1 on 07/26/18, with Udenyca - she tolerated fairly well with fatigue, nausea, and mucositis. She required IVF one week after treatment. She was able to recover moderately well -s/p cycle 2, 5FU was omitted for mucositis. She experienced sensation of throat closing and hoarseness after oxaliplatin. She continued to experience moderate fatigue and delayed n/v lasting almost 2 weeks.  -on cycle 3, due to questionable oxaliplatin neurotoxicity vs allergic reaction and low tolerance to intensive chemo, oxaliplatin was discontinued. She will proceed with irinotecan, leucovorin, and 5FU  -CA 19-9 improved to 52 after cycle 2, likely indicating response to treatment  2. Grade II mucositis and thrush  -developed mucositis with ulcers and pain, limiting po intake, after cycle 1. Resolved with magic mouthwash and  discontinuation of 5FU bolus -she developed oral thrush after cycle 3, eventually resolved with diflucan  3.EpigastricAbdominal pain, secondary to #1  -Improved since stent placed on 07/01/18 -pain is controlled, she takes tramadol AM and before bed -she denies pain today  4.Obstructive jaundice,s/p stent placement on 07/01/18  -LFTs have improved after stent placement.  -resolved  5. Low appetite, Weight loss, Nausea  -Years ago she had gastric band placed. She is interested in having it removed.  -She lost 32 pounds over 5 months secondary to her recent cancer.  -she is losing weight overall, has prolonged nausea with some vomiting after chemo  -she will increase zofran to q8 hours and continue alternating with compazine and phenergan -weight stable  6. H/o of right breast cancer, RS 16, ER/PR +, HER2- -Managed by Med Onc Dr [name]  -Diagnosed in 11/2014. S/p right lumpectomy and adjuvant radiation.  -She has been on Anastrozole since 05/2015. She is tolerating well. Will continue   7. Genetic testing  -negative on 08/01/18, no pathogenic mutations  8. HTN, DM -DM is mostly controlled, she is off medication -On Verapamil and atenolol -stable  9. Hypokalemia -secondary to decreased po intake, n/v, and BM -she has intermittent vomiting after chemo and 1-2 BM per day -K 3.0 on 8/10, she increased dose to BID -K normal today, continue BID   9. Goals of care -she is aware her cancer is inoperable and incurable. We reviewed it is still treatable. Goal of her treatment is palliative, to improve symptoms and prolong her life. I reviewed the importance of her quality of life.  - We previously discussed goals of care, she prioritizes mental health as well as her physical  health.  -She understands she will be on long term treatment as long as she can tolerate it and it controls her disease. She understands without treatment her cancer will progress. -she wants to spend time  with her husband and go on RV trip in Sept or October. She also does not want chemo over Christmas but will do so if needed.  -will continue to frequently assess her goals of care.   Disposition: Ms. Fana appears stable. She completed 3 cycles of chemotherapy. Oxaliplatin was held from cycle 3. She tolerates treatment moderately well with fatigue and mild nausea which is improved with supportive IVF. Will continue fluids on day 3 and around day 7. She developed thrush which resolved with diflucan. Will give her Rx for nystatin to begin if she develops recurrent infection. Labs reviewed. CBC and CMP stable. CA 19-9 improved to 52. Clinically she is doing well. Will proceed with cycle 4 chemo today with irinotecan, leucovorin and 5FU. She will return in 2 weeks for f/u and cycle 5. Her [birth year], she may want an extra week off treatment, she will let us know. Plan to restage after cycle 5 or 6. Patient agrees with the plan.   All questions were answered. The patient knows to call the clinic with any problems, questions or concerns. No barriers to learning was detected.    Alla Feeling, NP 09/05/18

## 2018-09-05 ENCOUNTER — Inpatient Hospital Stay (HOSPITAL_BASED_OUTPATIENT_CLINIC_OR_DEPARTMENT_OTHER): Payer: Medicare Other | Admitting: Nurse Practitioner

## 2018-09-05 ENCOUNTER — Other Ambulatory Visit: Payer: Self-pay

## 2018-09-05 ENCOUNTER — Inpatient Hospital Stay: Payer: Medicare Other

## 2018-09-05 ENCOUNTER — Telehealth: Payer: Self-pay | Admitting: Nurse Practitioner

## 2018-09-05 ENCOUNTER — Encounter: Payer: Self-pay | Admitting: Nurse Practitioner

## 2018-09-05 VITALS — BP 120/70 | HR 90 | Temp 99.0°F | Resp 17 | Wt 173.2 lb

## 2018-09-05 DIAGNOSIS — C250 Malignant neoplasm of head of pancreas: Secondary | ICD-10-CM

## 2018-09-05 DIAGNOSIS — Z923 Personal history of irradiation: Secondary | ICD-10-CM | POA: Diagnosis not present

## 2018-09-05 DIAGNOSIS — K831 Obstruction of bile duct: Secondary | ICD-10-CM | POA: Diagnosis not present

## 2018-09-05 DIAGNOSIS — Z5111 Encounter for antineoplastic chemotherapy: Secondary | ICD-10-CM | POA: Diagnosis not present

## 2018-09-05 DIAGNOSIS — C50411 Malignant neoplasm of upper-outer quadrant of right female breast: Secondary | ICD-10-CM | POA: Diagnosis not present

## 2018-09-05 DIAGNOSIS — R1013 Epigastric pain: Secondary | ICD-10-CM | POA: Diagnosis not present

## 2018-09-05 DIAGNOSIS — E785 Hyperlipidemia, unspecified: Secondary | ICD-10-CM | POA: Diagnosis not present

## 2018-09-05 DIAGNOSIS — I10 Essential (primary) hypertension: Secondary | ICD-10-CM | POA: Diagnosis not present

## 2018-09-05 DIAGNOSIS — Z170 Estrogen receptor positive status [ER+]: Secondary | ICD-10-CM | POA: Diagnosis not present

## 2018-09-05 DIAGNOSIS — Z95828 Presence of other vascular implants and grafts: Secondary | ICD-10-CM

## 2018-09-05 DIAGNOSIS — Z79811 Long term (current) use of aromatase inhibitors: Secondary | ICD-10-CM | POA: Diagnosis not present

## 2018-09-05 DIAGNOSIS — K1230 Oral mucositis (ulcerative), unspecified: Secondary | ICD-10-CM | POA: Diagnosis not present

## 2018-09-05 DIAGNOSIS — B370 Candidal stomatitis: Secondary | ICD-10-CM | POA: Diagnosis not present

## 2018-09-05 DIAGNOSIS — E119 Type 2 diabetes mellitus without complications: Secondary | ICD-10-CM | POA: Diagnosis not present

## 2018-09-05 DIAGNOSIS — K219 Gastro-esophageal reflux disease without esophagitis: Secondary | ICD-10-CM | POA: Diagnosis not present

## 2018-09-05 DIAGNOSIS — I700 Atherosclerosis of aorta: Secondary | ICD-10-CM | POA: Diagnosis not present

## 2018-09-05 DIAGNOSIS — R112 Nausea with vomiting, unspecified: Secondary | ICD-10-CM | POA: Diagnosis not present

## 2018-09-05 DIAGNOSIS — R5383 Other fatigue: Secondary | ICD-10-CM | POA: Diagnosis not present

## 2018-09-05 DIAGNOSIS — R490 Dysphonia: Secondary | ICD-10-CM | POA: Diagnosis not present

## 2018-09-05 DIAGNOSIS — E876 Hypokalemia: Secondary | ICD-10-CM | POA: Diagnosis not present

## 2018-09-05 DIAGNOSIS — C787 Secondary malignant neoplasm of liver and intrahepatic bile duct: Secondary | ICD-10-CM | POA: Diagnosis not present

## 2018-09-05 LAB — CMP (CANCER CENTER ONLY)
ALT: 15 U/L (ref 0–44)
AST: 15 U/L (ref 15–41)
Albumin: 3.1 g/dL — ABNORMAL LOW (ref 3.5–5.0)
Alkaline Phosphatase: 106 U/L (ref 38–126)
Anion gap: 10 (ref 5–15)
BUN: 11 mg/dL (ref 8–23)
CO2: 24 mmol/L (ref 22–32)
Calcium: 9.1 mg/dL (ref 8.9–10.3)
Chloride: 105 mmol/L (ref 98–111)
Creatinine: 0.74 mg/dL (ref 0.44–1.00)
GFR, Est AFR Am: >60 mL/min (ref >60)
GFR, Estimated: >60 mL/min (ref >60)
Glucose, Bld: 137 mg/dL — ABNORMAL HIGH (ref 70–99)
Potassium: 4.2 mmol/L (ref 3.5–5.1)
Sodium: 139 mmol/L (ref 135–145)
Total Bilirubin: 0.3 mg/dL (ref 0.3–1.2)
Total Protein: 6 g/dL — ABNORMAL LOW (ref 6.5–8.1)

## 2018-09-05 LAB — CBC WITH DIFFERENTIAL (CANCER CENTER ONLY)
Abs Immature Granulocytes: 0 10*3/uL (ref 0.00–0.07)
Basophils Absolute: 0 10*3/uL (ref 0.0–0.1)
Basophils Relative: 1 %
Eosinophils Absolute: 0.1 10*3/uL (ref 0.0–0.5)
Eosinophils Relative: 2 %
HCT: 35 % — ABNORMAL LOW (ref 36.0–46.0)
Hemoglobin: 11.4 g/dL — ABNORMAL LOW (ref 12.0–15.0)
Immature Granulocytes: 0 %
Lymphocytes Relative: 26 %
Lymphs Abs: 1 10*3/uL (ref 0.7–4.0)
MCH: 31.5 pg (ref 26.0–34.0)
MCHC: 32.6 g/dL (ref 30.0–36.0)
MCV: 96.7 fL (ref 80.0–100.0)
Monocytes Absolute: 0.5 10*3/uL (ref 0.1–1.0)
Monocytes Relative: 12 %
Neutro Abs: 2.3 10*3/uL (ref 1.7–7.7)
Neutrophils Relative %: 59 %
Platelet Count: 221 10*3/uL (ref 150–400)
RBC: 3.62 MIL/uL — ABNORMAL LOW (ref 3.87–5.11)
RDW: 16.2 % — ABNORMAL HIGH (ref 11.5–15.5)
WBC Count: 4 10*3/uL (ref 4.0–10.5)
nRBC: 0 % (ref 0.0–0.2)

## 2018-09-05 MED ORDER — PALONOSETRON HCL INJECTION 0.25 MG/5ML
INTRAVENOUS | Status: AC
  Filled 2018-09-05: qty 5

## 2018-09-05 MED ORDER — NYSTATIN 100000 UNIT/ML MT SUSP: 5.0000 mL | Freq: Three times a day (TID) | OROMUCOSAL | 0 refills | Status: DC

## 2018-09-05 MED ORDER — DEXTROSE 5 % IV SOLN
Freq: Once | INTRAVENOUS | Status: AC
  Administered 2018-09-05: 10:00:00 via INTRAVENOUS
  Filled 2018-09-05: qty 250

## 2018-09-05 MED ORDER — LEUCOVORIN CALCIUM INJECTION 350 MG
400.0000 mg/m2 | Freq: Once | INTRAVENOUS | Status: AC
  Administered 2018-09-05: 772 mg via INTRAVENOUS
  Filled 2018-09-05: qty 38.6

## 2018-09-05 MED ORDER — SODIUM CHLORIDE 0.9 % IV SOLN
Freq: Once | INTRAVENOUS | Status: AC
  Administered 2018-09-05: 11:00:00 via INTRAVENOUS
  Filled 2018-09-05: qty 5

## 2018-09-05 MED ORDER — HEPARIN SOD (PORK) LOCK FLUSH 100 UNIT/ML IV SOLN
500.0000 [IU] | Freq: Once | INTRAVENOUS | Status: DC | PRN
  Filled 2018-09-05: qty 5

## 2018-09-05 MED ORDER — ATROPINE SULFATE 1 MG/ML IJ SOLN
0.5000 mg | Freq: Once | INTRAMUSCULAR | Status: AC | PRN
  Administered 2018-09-05: 0.5 mg via INTRAVENOUS

## 2018-09-05 MED ORDER — SODIUM CHLORIDE 0.9 % IV SOLN
2400.0000 mg/m2 | INTRAVENOUS | Status: DC
  Administered 2018-09-05: 4650 mg via INTRAVENOUS
  Filled 2018-09-05: qty 93

## 2018-09-05 MED ORDER — IRINOTECAN HCL CHEMO INJECTION 100 MG/5ML
180.0000 mg/m2 | Freq: Once | INTRAVENOUS | Status: AC
  Administered 2018-09-05: 340 mg via INTRAVENOUS
  Filled 2018-09-05: qty 5

## 2018-09-05 MED ORDER — PALONOSETRON HCL INJECTION 0.25 MG/5ML
0.2500 mg | Freq: Once | INTRAVENOUS | Status: AC
  Administered 2018-09-05: 0.25 mg via INTRAVENOUS

## 2018-09-05 MED ORDER — SODIUM CHLORIDE 0.9% FLUSH
10.0000 mL | Freq: Once | INTRAVENOUS | Status: AC
  Administered 2018-09-05: 10 mL
  Filled 2018-09-05: qty 10

## 2018-09-05 MED ORDER — SODIUM CHLORIDE 0.9% FLUSH
10.0000 mL | INTRAVENOUS | Status: DC | PRN
  Filled 2018-09-05: qty 10

## 2018-09-05 MED ORDER — ATROPINE SULFATE 1 MG/ML IJ SOLN
INTRAMUSCULAR | Status: AC
  Filled 2018-09-05: qty 1

## 2018-09-05 NOTE — Patient Instructions (Signed)
[facility] Discharge Instructions for Patients Receiving Chemotherapy  Today you received the following chemotherapy agents: Irinotecan, Leucovorin, 5FU pump   To help prevent nausea and vomiting after your treatment, we encourage you to take your nausea medication as directed.    If you develop nausea and vomiting that is not controlled by your nausea medication, call the clinic.   BELOW ARE SYMPTOMS THAT SHOULD BE REPORTED IMMEDIATELY:  *FEVER GREATER THAN 100.5 F  *CHILLS WITH OR WITHOUT FEVER  NAUSEA AND VOMITING THAT IS NOT CONTROLLED WITH YOUR NAUSEA MEDICATION  *UNUSUAL SHORTNESS OF BREATH  *UNUSUAL BRUISING OR BLEEDING  TENDERNESS IN MOUTH AND THROAT WITH OR WITHOUT PRESENCE OF ULCERS  *URINARY PROBLEMS  *BOWEL PROBLEMS  UNUSUAL RASH Items with * indicate a potential emergency and should be followed up as soon as possible.  Feel free to call the clinic should you have any questions or concerns. The clinic phone number is [phone].  Please show the CHEMO ALERT CARD at check-in to the Emergency Department and triage nurse.   

## 2018-09-05 NOTE — Telephone Encounter (Signed)
Scheduled appt per 8/24 los.  Left a voice message of appt date and time.

## 2018-09-07 ENCOUNTER — Other Ambulatory Visit: Payer: Self-pay

## 2018-09-07 ENCOUNTER — Inpatient Hospital Stay: Payer: Medicare Other

## 2018-09-07 VITALS — BP 126/71 | HR 76 | Temp 97.6°F | Resp 17

## 2018-09-07 DIAGNOSIS — C787 Secondary malignant neoplasm of liver and intrahepatic bile duct: Secondary | ICD-10-CM | POA: Diagnosis not present

## 2018-09-07 DIAGNOSIS — I10 Essential (primary) hypertension: Secondary | ICD-10-CM | POA: Diagnosis not present

## 2018-09-07 DIAGNOSIS — E876 Hypokalemia: Secondary | ICD-10-CM | POA: Diagnosis not present

## 2018-09-07 DIAGNOSIS — C250 Malignant neoplasm of head of pancreas: Secondary | ICD-10-CM

## 2018-09-07 DIAGNOSIS — C50411 Malignant neoplasm of upper-outer quadrant of right female breast: Secondary | ICD-10-CM | POA: Diagnosis not present

## 2018-09-07 DIAGNOSIS — K1230 Oral mucositis (ulcerative), unspecified: Secondary | ICD-10-CM | POA: Diagnosis not present

## 2018-09-07 DIAGNOSIS — Z79811 Long term (current) use of aromatase inhibitors: Secondary | ICD-10-CM | POA: Diagnosis not present

## 2018-09-07 DIAGNOSIS — Z923 Personal history of irradiation: Secondary | ICD-10-CM | POA: Diagnosis not present

## 2018-09-07 DIAGNOSIS — E119 Type 2 diabetes mellitus without complications: Secondary | ICD-10-CM | POA: Diagnosis not present

## 2018-09-07 DIAGNOSIS — B370 Candidal stomatitis: Secondary | ICD-10-CM | POA: Diagnosis not present

## 2018-09-07 DIAGNOSIS — K219 Gastro-esophageal reflux disease without esophagitis: Secondary | ICD-10-CM | POA: Diagnosis not present

## 2018-09-07 DIAGNOSIS — E785 Hyperlipidemia, unspecified: Secondary | ICD-10-CM | POA: Diagnosis not present

## 2018-09-07 DIAGNOSIS — Z5111 Encounter for antineoplastic chemotherapy: Secondary | ICD-10-CM | POA: Diagnosis not present

## 2018-09-07 DIAGNOSIS — R112 Nausea with vomiting, unspecified: Secondary | ICD-10-CM | POA: Diagnosis not present

## 2018-09-07 DIAGNOSIS — R490 Dysphonia: Secondary | ICD-10-CM | POA: Diagnosis not present

## 2018-09-07 DIAGNOSIS — I700 Atherosclerosis of aorta: Secondary | ICD-10-CM | POA: Diagnosis not present

## 2018-09-07 DIAGNOSIS — K831 Obstruction of bile duct: Secondary | ICD-10-CM | POA: Diagnosis not present

## 2018-09-07 DIAGNOSIS — R5383 Other fatigue: Secondary | ICD-10-CM | POA: Diagnosis not present

## 2018-09-07 DIAGNOSIS — Z170 Estrogen receptor positive status [ER+]: Secondary | ICD-10-CM | POA: Diagnosis not present

## 2018-09-07 DIAGNOSIS — R1013 Epigastric pain: Secondary | ICD-10-CM | POA: Diagnosis not present

## 2018-09-07 DIAGNOSIS — Z95828 Presence of other vascular implants and grafts: Secondary | ICD-10-CM

## 2018-09-07 MED ORDER — SODIUM CHLORIDE 0.9% FLUSH
10.0000 mL | INTRAVENOUS | Status: DC | PRN
  Administered 2018-09-07: 10 mL
  Filled 2018-09-07: qty 10

## 2018-09-07 MED ORDER — SODIUM CHLORIDE 0.9 % IV SOLN
INTRAVENOUS | Status: DC
  Administered 2018-09-07: 11:00:00 via INTRAVENOUS
  Filled 2018-09-07 (×2): qty 250

## 2018-09-07 MED ORDER — HEPARIN SOD (PORK) LOCK FLUSH 100 UNIT/ML IV SOLN
500.0000 [IU] | Freq: Once | INTRAVENOUS | Status: AC | PRN
  Administered 2018-09-07: 13:00:00 500 [IU]
  Filled 2018-09-07: qty 5

## 2018-09-07 NOTE — Patient Instructions (Signed)
Dehydration, Adult  Dehydration is when there is not enough fluid or water in your body. This happens when you lose more fluids than you take in. Dehydration can range from mild to very bad. It should be treated right away to keep it from getting very bad. Symptoms of mild dehydration may include:  Thirst.  Dry lips.  Slightly dry mouth.  Dry, warm skin.  Dizziness. Symptoms of moderate dehydration may include:  Very dry mouth.  Muscle cramps.  Dark pee (urine). Pee may be the color of tea.  Your body making less pee.  Your eyes making fewer tears.  Heartbeat that is uneven or faster than normal (palpitations).  Headache.  Light-headedness, especially when you stand up from sitting.  Fainting (syncope). Symptoms of very bad dehydration may include:  Changes in skin, such as: ? Cold and clammy skin. ? Blotchy (mottled) or pale skin. ? Skin that does not quickly return to normal after being lightly pinched and let go (poor skin turgor).  Changes in body fluids, such as: ? Feeling very thirsty. ? Your eyes making fewer tears. ? Not sweating when body temperature is high, such as in hot weather. ? Your body making very little pee.  Changes in vital signs, such as: ? Weak pulse. ? Pulse that is more than 100 beats a minute when you are sitting still. ? Fast breathing. ? Low blood pressure.  Other changes, such as: ? Sunken eyes. ? Cold hands and feet. ? Confusion. ? Lack of energy (lethargy). ? Trouble waking up from sleep. ? Short-term weight loss. ? Unconsciousness. Follow these instructions at home:   If told by your doctor, drink an ORS: ? Make an ORS by using instructions on the package. ? Start by drinking small amounts, about  cup (120 mL) every 5-10 minutes. ? Slowly drink more until you have had the amount that your doctor said to have.  Drink enough clear fluid to keep your pee clear or pale yellow. If you were told to drink an ORS, finish the  ORS first, then start slowly drinking clear fluids. Drink fluids such as: ? Water. Do not drink only water by itself. Doing that can make the salt (sodium) level in your body get too low (hyponatremia). ? Ice chips. ? Fruit juice that you have added water to (diluted). ? Low-calorie sports drinks.  Avoid: ? Alcohol. ? Drinks that have a lot of sugar. These include high-calorie sports drinks, fruit juice that does not have water added, and soda. ? Caffeine. ? Foods that are greasy or have a lot of fat or sugar.  Take over-the-counter and prescription medicines only as told by your doctor.  Do not take salt tablets. Doing that can make the salt level in your body get too high (hypernatremia).  Eat foods that have minerals (electrolytes). Examples include bananas, oranges, potatoes, tomatoes, and spinach.  Keep all follow-up visits as told by your doctor. This is important. Contact a doctor if:  You have belly (abdominal) pain that: ? Gets worse. ? Stays in one area (localizes).  You have a rash.  You have a stiff neck.  You get angry or annoyed more easily than normal (irritability).  You are more sleepy than normal.  You have a harder time waking up than normal.  You feel: ? Weak. ? Dizzy. ? Very thirsty.  You have peed (urinated) only a small amount of very dark pee during 6-8 hours. Get help right away if:  You have   symptoms of very bad dehydration.  You cannot drink fluids without throwing up (vomiting).  Your symptoms get worse with treatment.  You have a fever.  You have a very bad headache.  You are throwing up or having watery poop (diarrhea) and it: ? Gets worse. ? Does not go away.  You have blood or something green (bile) in your throw-up.  You have blood in your poop (stool). This may cause poop to look black and tarry.  You have not peed in 6-8 hours.  You pass out (faint).  Your heart rate when you are sitting still is more than 100 beats a  minute.  You have trouble breathing. This information is not intended to replace advice given to you by your health care provider. Make sure you discuss any questions you have with your health care provider. Document Released: 10/25/2008 Document Revised: 12/11/2016 Document Reviewed: 02/22/2015 Elsevier Patient Education  2020 [employer]  

## 2018-09-12 ENCOUNTER — Other Ambulatory Visit: Payer: Self-pay

## 2018-09-12 ENCOUNTER — Inpatient Hospital Stay: Payer: Medicare Other

## 2018-09-12 VITALS — BP 115/67 | HR 78 | Temp 98.0°F | Resp 17

## 2018-09-12 DIAGNOSIS — C787 Secondary malignant neoplasm of liver and intrahepatic bile duct: Secondary | ICD-10-CM | POA: Diagnosis not present

## 2018-09-12 DIAGNOSIS — R1013 Epigastric pain: Secondary | ICD-10-CM | POA: Diagnosis not present

## 2018-09-12 DIAGNOSIS — I10 Essential (primary) hypertension: Secondary | ICD-10-CM | POA: Diagnosis not present

## 2018-09-12 DIAGNOSIS — C50411 Malignant neoplasm of upper-outer quadrant of right female breast: Secondary | ICD-10-CM | POA: Diagnosis not present

## 2018-09-12 DIAGNOSIS — K1230 Oral mucositis (ulcerative), unspecified: Secondary | ICD-10-CM | POA: Diagnosis not present

## 2018-09-12 DIAGNOSIS — Z95828 Presence of other vascular implants and grafts: Secondary | ICD-10-CM

## 2018-09-12 DIAGNOSIS — B370 Candidal stomatitis: Secondary | ICD-10-CM | POA: Diagnosis not present

## 2018-09-12 DIAGNOSIS — Z923 Personal history of irradiation: Secondary | ICD-10-CM | POA: Diagnosis not present

## 2018-09-12 DIAGNOSIS — E785 Hyperlipidemia, unspecified: Secondary | ICD-10-CM | POA: Diagnosis not present

## 2018-09-12 DIAGNOSIS — C250 Malignant neoplasm of head of pancreas: Secondary | ICD-10-CM | POA: Diagnosis not present

## 2018-09-12 DIAGNOSIS — E119 Type 2 diabetes mellitus without complications: Secondary | ICD-10-CM | POA: Diagnosis not present

## 2018-09-12 DIAGNOSIS — Z5111 Encounter for antineoplastic chemotherapy: Secondary | ICD-10-CM | POA: Diagnosis not present

## 2018-09-12 DIAGNOSIS — Z170 Estrogen receptor positive status [ER+]: Secondary | ICD-10-CM | POA: Diagnosis not present

## 2018-09-12 DIAGNOSIS — Z79811 Long term (current) use of aromatase inhibitors: Secondary | ICD-10-CM | POA: Diagnosis not present

## 2018-09-12 DIAGNOSIS — I700 Atherosclerosis of aorta: Secondary | ICD-10-CM | POA: Diagnosis not present

## 2018-09-12 DIAGNOSIS — R5383 Other fatigue: Secondary | ICD-10-CM | POA: Diagnosis not present

## 2018-09-12 DIAGNOSIS — K831 Obstruction of bile duct: Secondary | ICD-10-CM | POA: Diagnosis not present

## 2018-09-12 DIAGNOSIS — K219 Gastro-esophageal reflux disease without esophagitis: Secondary | ICD-10-CM | POA: Diagnosis not present

## 2018-09-12 DIAGNOSIS — R490 Dysphonia: Secondary | ICD-10-CM | POA: Diagnosis not present

## 2018-09-12 DIAGNOSIS — R112 Nausea with vomiting, unspecified: Secondary | ICD-10-CM | POA: Diagnosis not present

## 2018-09-12 DIAGNOSIS — E876 Hypokalemia: Secondary | ICD-10-CM | POA: Diagnosis not present

## 2018-09-12 MED ORDER — HEPARIN SOD (PORK) LOCK FLUSH 100 UNIT/ML IV SOLN
500.0000 [IU] | Freq: Once | INTRAVENOUS | Status: AC
  Administered 2018-09-12: 500 [IU]
  Filled 2018-09-12: qty 5

## 2018-09-12 MED ORDER — SODIUM CHLORIDE 0.9 % IV SOLN
INTRAVENOUS | Status: DC
  Administered 2018-09-12: 09:00:00 via INTRAVENOUS
  Filled 2018-09-12 (×2): qty 250

## 2018-09-12 MED ORDER — SODIUM CHLORIDE 0.9% FLUSH
10.0000 mL | Freq: Once | INTRAVENOUS | Status: AC
  Administered 2018-09-12: 11:00:00 10 mL
  Filled 2018-09-12: qty 10

## 2018-09-12 NOTE — Patient Instructions (Signed)
Dehydration, Adult  Dehydration is when there is not enough fluid or water in your body. This happens when you lose more fluids than you take in. Dehydration can range from mild to very bad. It should be treated right away to keep it from getting very bad. Symptoms of mild dehydration may include:  Thirst.  Dry lips.  Slightly dry mouth.  Dry, warm skin.  Dizziness. Symptoms of moderate dehydration may include:  Very dry mouth.  Muscle cramps.  Dark pee (urine). Pee may be the color of tea.  Your body making less pee.  Your eyes making fewer tears.  Heartbeat that is uneven or faster than normal (palpitations).  Headache.  Light-headedness, especially when you stand up from sitting.  Fainting (syncope). Symptoms of very bad dehydration may include:  Changes in skin, such as: ? Cold and clammy skin. ? Blotchy (mottled) or pale skin. ? Skin that does not quickly return to normal after being lightly pinched and let go (poor skin turgor).  Changes in body fluids, such as: ? Feeling very thirsty. ? Your eyes making fewer tears. ? Not sweating when body temperature is high, such as in hot weather. ? Your body making very little pee.  Changes in vital signs, such as: ? Weak pulse. ? Pulse that is more than 100 beats a minute when you are sitting still. ? Fast breathing. ? Low blood pressure.  Other changes, such as: ? Sunken eyes. ? Cold hands and feet. ? Confusion. ? Lack of energy (lethargy). ? Trouble waking up from sleep. ? Short-term weight loss. ? Unconsciousness. Follow these instructions at home:   If told by your doctor, drink an ORS: ? Make an ORS by using instructions on the package. ? Start by drinking small amounts, about  cup (120 mL) every 5-10 minutes. ? Slowly drink more until you have had the amount that your doctor said to have.  Drink enough clear fluid to keep your pee clear or pale yellow. If you were told to drink an ORS, finish the  ORS first, then start slowly drinking clear fluids. Drink fluids such as: ? Water. Do not drink only water by itself. Doing that can make the salt (sodium) level in your body get too low (hyponatremia). ? Ice chips. ? Fruit juice that you have added water to (diluted). ? Low-calorie sports drinks.  Avoid: ? Alcohol. ? Drinks that have a lot of sugar. These include high-calorie sports drinks, fruit juice that does not have water added, and soda. ? Caffeine. ? Foods that are greasy or have a lot of fat or sugar.  Take over-the-counter and prescription medicines only as told by your doctor.  Do not take salt tablets. Doing that can make the salt level in your body get too high (hypernatremia).  Eat foods that have minerals (electrolytes). Examples include bananas, oranges, potatoes, tomatoes, and spinach.  Keep all follow-up visits as told by your doctor. This is important. Contact a doctor if:  You have belly (abdominal) pain that: ? Gets worse. ? Stays in one area (localizes).  You have a rash.  You have a stiff neck.  You get angry or annoyed more easily than normal (irritability).  You are more sleepy than normal.  You have a harder time waking up than normal.  You feel: ? Weak. ? Dizzy. ? Very thirsty.  You have peed (urinated) only a small amount of very dark pee during 6-8 hours. Get help right away if:  You have   symptoms of very bad dehydration.  You cannot drink fluids without throwing up (vomiting).  Your symptoms get worse with treatment.  You have a fever.  You have a very bad headache.  You are throwing up or having watery poop (diarrhea) and it: ? Gets worse. ? Does not go away.  You have blood or something green (bile) in your throw-up.  You have blood in your poop (stool). This may cause poop to look black and tarry.  You have not peed in 6-8 hours.  You pass out (faint).  Your heart rate when you are sitting still is more than 100 beats a  minute.  You have trouble breathing. This information is not intended to replace advice given to you by your health care provider. Make sure you discuss any questions you have with your health care provider. Document Released: 10/25/2008 Document Revised: 12/11/2016 Document Reviewed: 02/22/2015 Elsevier Patient Education  2020 [employer]  

## 2018-09-21 ENCOUNTER — Inpatient Hospital Stay: Payer: Medicare Other

## 2018-09-21 ENCOUNTER — Other Ambulatory Visit: Payer: Self-pay

## 2018-09-21 ENCOUNTER — Inpatient Hospital Stay (HOSPITAL_BASED_OUTPATIENT_CLINIC_OR_DEPARTMENT_OTHER): Payer: Medicare Other | Admitting: Hematology

## 2018-09-21 ENCOUNTER — Inpatient Hospital Stay: Payer: Medicare Other | Attending: Hematology

## 2018-09-21 ENCOUNTER — Encounter: Payer: Self-pay | Admitting: Hematology

## 2018-09-21 VITALS — BP 143/83 | HR 85 | Temp 98.0°F | Resp 17 | Ht 63.0 in | Wt 173.6 lb

## 2018-09-21 DIAGNOSIS — F419 Anxiety disorder, unspecified: Secondary | ICD-10-CM | POA: Diagnosis not present

## 2018-09-21 DIAGNOSIS — E1165 Type 2 diabetes mellitus with hyperglycemia: Secondary | ICD-10-CM | POA: Insufficient documentation

## 2018-09-21 DIAGNOSIS — Z23 Encounter for immunization: Secondary | ICD-10-CM | POA: Insufficient documentation

## 2018-09-21 DIAGNOSIS — Z79899 Other long term (current) drug therapy: Secondary | ICD-10-CM | POA: Insufficient documentation

## 2018-09-21 DIAGNOSIS — Z170 Estrogen receptor positive status [ER+]: Secondary | ICD-10-CM | POA: Insufficient documentation

## 2018-09-21 DIAGNOSIS — K219 Gastro-esophageal reflux disease without esophagitis: Secondary | ICD-10-CM | POA: Insufficient documentation

## 2018-09-21 DIAGNOSIS — I700 Atherosclerosis of aorta: Secondary | ICD-10-CM | POA: Insufficient documentation

## 2018-09-21 DIAGNOSIS — Z7982 Long term (current) use of aspirin: Secondary | ICD-10-CM | POA: Insufficient documentation

## 2018-09-21 DIAGNOSIS — R110 Nausea: Secondary | ICD-10-CM | POA: Insufficient documentation

## 2018-09-21 DIAGNOSIS — E785 Hyperlipidemia, unspecified: Secondary | ICD-10-CM | POA: Insufficient documentation

## 2018-09-21 DIAGNOSIS — Z79811 Long term (current) use of aromatase inhibitors: Secondary | ICD-10-CM | POA: Diagnosis not present

## 2018-09-21 DIAGNOSIS — Z95828 Presence of other vascular implants and grafts: Secondary | ICD-10-CM

## 2018-09-21 DIAGNOSIS — C250 Malignant neoplasm of head of pancreas: Secondary | ICD-10-CM

## 2018-09-21 DIAGNOSIS — Z5111 Encounter for antineoplastic chemotherapy: Secondary | ICD-10-CM | POA: Insufficient documentation

## 2018-09-21 DIAGNOSIS — C50411 Malignant neoplasm of upper-outer quadrant of right female breast: Secondary | ICD-10-CM | POA: Diagnosis not present

## 2018-09-21 DIAGNOSIS — Z923 Personal history of irradiation: Secondary | ICD-10-CM | POA: Insufficient documentation

## 2018-09-21 DIAGNOSIS — Z9221 Personal history of antineoplastic chemotherapy: Secondary | ICD-10-CM | POA: Diagnosis not present

## 2018-09-21 DIAGNOSIS — R1013 Epigastric pain: Secondary | ICD-10-CM | POA: Insufficient documentation

## 2018-09-21 DIAGNOSIS — I10 Essential (primary) hypertension: Secondary | ICD-10-CM | POA: Insufficient documentation

## 2018-09-21 DIAGNOSIS — E039 Hypothyroidism, unspecified: Secondary | ICD-10-CM | POA: Diagnosis not present

## 2018-09-21 DIAGNOSIS — K831 Obstruction of bile duct: Secondary | ICD-10-CM | POA: Insufficient documentation

## 2018-09-21 LAB — CBC WITH DIFFERENTIAL (CANCER CENTER ONLY)
Abs Immature Granulocytes: 0.02 10*3/uL (ref 0.00–0.07)
Basophils Absolute: 0.1 10*3/uL (ref 0.0–0.1)
Basophils Relative: 1 %
Eosinophils Absolute: 0 10*3/uL (ref 0.0–0.5)
Eosinophils Relative: 0 %
HCT: 34.4 % — ABNORMAL LOW (ref 36.0–46.0)
Hemoglobin: 11.2 g/dL — ABNORMAL LOW (ref 12.0–15.0)
Immature Granulocytes: 0 %
Lymphocytes Relative: 24 %
Lymphs Abs: 1.5 10*3/uL (ref 0.7–4.0)
MCH: 31.8 pg (ref 26.0–34.0)
MCHC: 32.6 g/dL (ref 30.0–36.0)
MCV: 97.7 fL (ref 80.0–100.0)
Monocytes Absolute: 0.8 10*3/uL (ref 0.1–1.0)
Monocytes Relative: 13 %
Neutro Abs: 3.8 10*3/uL (ref 1.7–7.7)
Neutrophils Relative %: 62 %
Platelet Count: 204 10*3/uL (ref 150–400)
RBC: 3.52 MIL/uL — ABNORMAL LOW (ref 3.87–5.11)
RDW: 16.9 % — ABNORMAL HIGH (ref 11.5–15.5)
WBC Count: 6.2 10*3/uL (ref 4.0–10.5)
nRBC: 0 % (ref 0.0–0.2)

## 2018-09-21 LAB — CMP (CANCER CENTER ONLY)
ALT: 16 U/L (ref 0–44)
AST: 25 U/L (ref 15–41)
Albumin: 3.3 g/dL — ABNORMAL LOW (ref 3.5–5.0)
Alkaline Phosphatase: 131 U/L — ABNORMAL HIGH (ref 38–126)
Anion gap: 10 (ref 5–15)
BUN: 7 mg/dL — ABNORMAL LOW (ref 8–23)
CO2: 28 mmol/L (ref 22–32)
Calcium: 9 mg/dL (ref 8.9–10.3)
Chloride: 102 mmol/L (ref 98–111)
Creatinine: 0.69 mg/dL (ref 0.44–1.00)
GFR, Est AFR Am: >60 mL/min (ref >60)
GFR, Estimated: >60 mL/min (ref >60)
Glucose, Bld: 112 mg/dL — ABNORMAL HIGH (ref 70–99)
Potassium: 3.9 mmol/L (ref 3.5–5.1)
Sodium: 140 mmol/L (ref 135–145)
Total Bilirubin: 0.5 mg/dL (ref 0.3–1.2)
Total Protein: 6.2 g/dL — ABNORMAL LOW (ref 6.5–8.1)

## 2018-09-21 MED ORDER — PALONOSETRON HCL INJECTION 0.25 MG/5ML
0.2500 mg | Freq: Once | INTRAVENOUS | Status: AC
  Administered 2018-09-21: 0.25 mg via INTRAVENOUS

## 2018-09-21 MED ORDER — ATROPINE SULFATE 1 MG/ML IJ SOLN
INTRAMUSCULAR | Status: AC
  Filled 2018-09-21: qty 1

## 2018-09-21 MED ORDER — ATROPINE SULFATE 1 MG/ML IJ SOLN
0.5000 mg | Freq: Once | INTRAMUSCULAR | Status: AC | PRN
  Administered 2018-09-21: 0.5 mg via INTRAVENOUS

## 2018-09-21 MED ORDER — SODIUM CHLORIDE 0.9 % IV SOLN
INTRAVENOUS | Status: DC
  Administered 2018-09-21: 13:00:00 via INTRAVENOUS
  Filled 2018-09-21: qty 250

## 2018-09-21 MED ORDER — IRINOTECAN HCL CHEMO INJECTION 100 MG/5ML
180.0000 mg/m2 | Freq: Once | INTRAVENOUS | Status: AC
  Administered 2018-09-21: 340 mg via INTRAVENOUS
  Filled 2018-09-21: qty 15

## 2018-09-21 MED ORDER — SODIUM CHLORIDE 0.9 % IV SOLN
Freq: Once | INTRAVENOUS | Status: AC
  Administered 2018-09-21: 13:00:00 via INTRAVENOUS
  Filled 2018-09-21: qty 5

## 2018-09-21 MED ORDER — SODIUM CHLORIDE 0.9 % IV SOLN
INTRAVENOUS | Status: DC
  Administered 2018-09-21 (×3): via INTRAVENOUS
  Filled 2018-09-21: qty 250

## 2018-09-21 MED ORDER — SODIUM CHLORIDE 0.9 % IV SOLN
2400.0000 mg/m2 | INTRAVENOUS | Status: DC
  Administered 2018-09-21: 4650 mg via INTRAVENOUS
  Filled 2018-09-21: qty 93

## 2018-09-21 MED ORDER — LEUCOVORIN CALCIUM INJECTION 350 MG
400.0000 mg/m2 | Freq: Once | INTRAVENOUS | Status: AC
  Administered 2018-09-21: 14:00:00 772 mg via INTRAVENOUS
  Filled 2018-09-21: qty 38.6

## 2018-09-21 MED ORDER — PALONOSETRON HCL INJECTION 0.25 MG/5ML
INTRAVENOUS | Status: AC
  Filled 2018-09-21: qty 5

## 2018-09-21 MED ORDER — SODIUM CHLORIDE 0.9% FLUSH
10.0000 mL | Freq: Once | INTRAVENOUS | Status: AC
  Administered 2018-09-21: 10 mL
  Filled 2018-09-21: qty 10

## 2018-09-21 NOTE — Patient Instructions (Signed)
[facility] Discharge Instructions for Patients Receiving Chemotherapy  Today you received the following chemotherapy agents :  Irinotecan, Leucovorin, Fluorouracil.  To help prevent nausea and vomiting after your treatment, we encourage you to take your nausea medication as prescribed.   If you develop nausea and vomiting that is not controlled by your nausea medication, call the clinic.   BELOW ARE SYMPTOMS THAT SHOULD BE REPORTED IMMEDIATELY:  *FEVER GREATER THAN 100.5 F  *CHILLS WITH OR WITHOUT FEVER  NAUSEA AND VOMITING THAT IS NOT CONTROLLED WITH YOUR NAUSEA MEDICATION  *UNUSUAL SHORTNESS OF BREATH  *UNUSUAL BRUISING OR BLEEDING  TENDERNESS IN MOUTH AND THROAT WITH OR WITHOUT PRESENCE OF ULCERS  *URINARY PROBLEMS  *BOWEL PROBLEMS  UNUSUAL RASH Items with * indicate a potential emergency and should be followed up as soon as possible.  Feel free to call the clinic should you have any questions or concerns. The clinic phone number is [phone].  Please show the CHEMO ALERT CARD at check-in to the Emergency Department and triage nurse.   

## 2018-09-21 NOTE — Patient Instructions (Signed)

## 2018-09-21 NOTE — Progress Notes (Signed)
[place]   [phone] [phone]) [phone]   Clinic Follow up Note   Patient Care Team: Unk Pinto, MD as PCP - General (Internal Medicine) Alphonsa Overall, MD as Consulting Physician (General Surgery) Magrinat, Virgie Dad, MD as Consulting Physician (Oncology) Thea Silversmith, MD as Consulting Physician (Radiation Oncology) Sylvan Cheese, NP as Nurse Practitioner (Hematology and Oncology) Mauri Pole, MD as Consulting Physician (Gastroenterology) Arna Snipe, RN as Oncology Nurse Navigator  Date of Service:  09/21/2018  CHIEF COMPLAINT:  F/u of Adenocarcinoma of Pancreatic Head  SUMMARY OF ONCOLOGIC HISTORY: Oncology History  Malignant neoplasm of upper-outer quadrant of right breast in female, estrogen receptor positive ([place])  11/15/2014 Mammogram   Right breast: mass warranting further evaluation   11/27/2014 Breast US   0.4 x 0.4 x 0.6 cm irregular hypoechoic mass within the right breast 10 o'clock position 7 cm from the nipple. Additionally there is a 0.7 x 0.8 x 0.8 cm taller than wide irregular hypoechoic mass with posterior acoustic shadowing within the right breast.   11/28/2014 Initial Biopsy   Right breast core needle biopsies, 2 masses: both invasive ductal carcinoma, grade 1, ER+ (100%), PR+ (85% -larger), HER2/neu negative, Ki67 5-10%.  Smaller mass PR-   11/28/2014 Clinical Stage   Stage IA: T1b N0   12/28/2014 Definitive Surgery   Right lumpectomies/SLNB: invasive ductal carcinoma, grade 2, repeat HER2/neu negative, 0/2 LN   12/28/2014 Pathologic Stage   Stage IA: npT1c pN0   12/28/2014 Oncotype testing   RS 16 (10% ROR)   02/18/2015 - 04/03/2015 Radiation Therapy    Right breast / 45 Gray at 1.8 [place] Cables per fraction x 25 fractions; right breast boost / 16 Gray at [employer] per fraction x 8 fractions   05/13/2015 -  Anti-estrogen oral therapy   Anastrozole 1 mg daily.   05/30/2015 Survivorship   SCP mailed to  patient in lieu of in person visit   08/01/2018 Genetic Testing   Negative genetic testing on the common hereditary cancer panel.  The Common Hereditary Gene Panel offered by [facility] includes sequencing and/or deletion duplication testing of the following 48 genes: APC, ATM, AXIN2, BARD1, BMPR1A, BRCA1, BRCA2, BRIP1, CDH1, CDK4, CDKN2A (p14ARF), CDKN2A (p16INK4a), CHEK2, CTNNA1, DICER1, EPCAM (Deletion/duplication testing only), GREM1 (promoter region deletion/duplication testing only), KIT, MEN1, MLH1, MSH2, MSH3, MSH6, MUTYH, NBN, NF1, NHTL1, PALB2, PDGFRA, PMS2, POLD1, POLE, PTEN, RAD50, RAD51C, RAD51D, RNF43, SDHB, SDHC, SDHD, SMAD4, SMARCA4. STK11, TP53, TSC1, TSC2, and VHL.  The following genes were evaluated for sequence changes only: SDHA and HOXB13 c.251G>A variant only. The report date is August 01, 2018.   Pancreatic cancer ([place])  06/30/2018 Imaging   CT AP WO Contrast 06/30/18  IMPRESSION: 1. New common bile duct dilatation and intrahepatic bile duct dilatation. New fullness of the pancreatic head which could represent rapidly developing obstructive neoplastic process or sequela of recent localized pancreatitis. Recommend further characterization with MRCP or ERCP. 2. No other acute or significant findings. No bowel obstruction or evidence of bowel wall inflammation. No free fluid or abscess collection. No free intraperitoneal air. 3. Additional chronic/incidental findings detailed above.   07/01/2018 Procedure   ERCP by Dr Fuller Plan on 07/01/18  IMPRESSION - A single severe segmental biliary stricture was found in the lower third of the common bile duct. The stricture was malignant appearing. Brushed. - The entire biliary tree proximal to the stricture was dilated, secondary to the stricture. - Prior cholecystectomy. - A biliary sphincterotomy was performed. -  One plastic stent was placed into the common bile duct.   07/01/2018 Pathology Results   Diagnosis 07/01/18 BILE DUCT BRUSHING  (SPECIMEN 1 OF 1 COLLECTED 07/01/2018) SUSPICIOUS FOR ADENOCARCINOMA. SEE COMMENT.   07/14/2018 Initial Diagnosis   Pancreatic cancer ([place])   07/14/2018 Procedure   EUS by Dr Ardis Hughs on 07/14/18 IMPRESSION PENDING    07/14/2018 Cancer Staging   Staging form: Exocrine Pancreas, AJCC 8th Edition - Clinical: Stage IB (cT2, cN0, cM0) - Signed by Truitt Merle, MD on 07/15/2018   07/21/2018 PET scan   IMPRESSION: 1. Mass in head of pancreas is intensely hypermetabolic compatible with primary pancreatic adenocarcinoma. 2. Enlarged and FDG avid porta hepatic lymph node compatible with metastatic adenopathy. 3. Extensive FDG avid liver metastases involve both lobes of liver and exhibit marked progression when compared with MRI from 07/01/2018. 4.  Aortic Atherosclerosis (ICD10-I70.0).   07/26/2018 -  Chemotherapy   FOLFIRINOX q2weeks starting 07/26/18      CURRENT THERAPY:  FOLFIRINOX q2weeks starting 07/26/18   INTERVAL HISTORY:  Taylor Oconnor is here for a follow up and treatment. She presents to the clinic alone.    REVIEW OF SYSTEMS:   Constitutional: Denies fevers, chills or abnormal weight loss Eyes: Denies blurriness of vision Ears, nose, mouth, throat, and face: Denies mucositis or sore throat Respiratory: Denies cough, dyspnea or wheezes Cardiovascular: Denies palpitation, chest discomfort or lower extremity swelling Gastrointestinal:  Denies nausea, heartburn or change in bowel habits Skin: Denies abnormal skin rashes Lymphatics: Denies new lymphadenopathy or easy bruising Neurological:Denies numbness, tingling or new weaknesses Behavioral/Psych: Mood is stable, no new changes  All other systems were reviewed with the patient and are negative.  MEDICAL HISTORY:  Past Medical History:  Diagnosis Date  . Allergy    SEASONAL  . Anxiety   . At risk for difficult insertion of breathing tube   . Breast cancer of upper-outer quadrant of right female breast ([place]) 12/03/2014  . Common  bile duct (CBD) obstruction   . 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 Family history of breast cancer   . GERD (gastroesophageal reflux disease)    hx - resolved with lap band surgery  . H/O hiatal hernia    resolved with band surgery  . H/O laparoscopic adjustable gastric banding 2010  . History of kidney stones 02/2018  . Hyperlipidemia   . Hypertension   . Hypokalemia   . Insomnia    tx with zanax  . Obesity   . Pancreatic mass   . PCOS (polycystic ovarian syndrome)   . Personal history of radiation therapy   . 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 Type II or unspecified type diabetes mellitus without mention of complication, not stated as uncontrolled 12/14/2012   no meds, diet controlled  . Wears contact lenses   . Wears glasses     SURGICAL HISTORY: Past Surgical History:  Procedure Laterality Date  . BACK SURGERY     cervical  . BILIARY STENT PLACEMENT N/A 07/01/2018   Procedure: BILIARY STENT PLACEMENT;  Surgeon: Ladene Artist, MD;  Location: [facility];  Service: Endoscopy;  Laterality: N/A;  cbd brushing  . BREAST BIOPSY    . BREAST LUMPECTOMY Right 2016  . BREAST LUMPECTOMY WITH NEEDLE LOCALIZATION AND AXILLARY SENTINEL LYMPH NODE BX Right 12/28/2014   Procedure: RIGHT BREAST LUMPECTOMY WITH TWO (2) NEEDLE LOCALIZATION AND AXILLARY SENTINEL LYMPH NODE BX;  Surgeon: Alphonsa Overall, MD;  Location: [facility];  Service: General;  Laterality: Right;  . CESAREAN SECTION     x 1  . CHOLECYSTECTOMY    . COLONOSCOPY  09/29/2004   normal  . CYSTOSCOPY WITH RETROGRADE PYELOGRAM, URETEROSCOPY AND STENT PLACEMENT Right 03/24/2018   Procedure: CYSTOSCOPY WITH RETROGRADE PYELOGRAM, URETEROSCOPY AND STENT PLACEMENT;  Surgeon: Franchot Gallo, MD;  Location: [facility];  Service: Urology;  Laterality: Right;  1 HR  . DIAGNOSTIC LAPAROSCOPY    . ERCP N/A 07/01/2018   Procedure: ENDOSCOPIC RETROGRADE CHOLANGIOPANCREATOGRAPHY  (ERCP);  Surgeon: Ladene Artist, MD;  Location: [facility];  Service: Endoscopy;  Laterality: N/A;  . ESOPHAGOGASTRODUODENOSCOPY (EGD) WITH PROPOFOL N/A 07/14/2018   Procedure: ESOPHAGOGASTRODUODENOSCOPY (EGD) WITH PROPOFOL;  Surgeon: Milus Banister, MD;  Location: [facility];  Service: Endoscopy;  Laterality: N/A;  . EUS N/A 07/14/2018   Procedure: UPPER ENDOSCOPIC ULTRASOUND (EUS) RADIAL;  Surgeon: Milus Banister, MD;  Location: [facility];  Service: Endoscopy;  Laterality: N/A;  . FINE NEEDLE ASPIRATION  07/14/2018   Procedure: FINE NEEDLE ASPIRATION (FNA) RADIAL;  Surgeon: Milus Banister, MD;  Location: [facility];  Service: Endoscopy;;  . HERNIA REPAIR  2008  . IR IMAGING GUIDED PORT INSERTION  07/20/2018  . LAPAROSCOPIC APPENDECTOMY N/A 08/06/2012   Procedure: APPENDECTOMY LAPAROSCOPIC;  Surgeon: Earnstine Regal, MD;  Location: [facility];  Service: General;  Laterality: N/A;  . LAPAROSCOPIC GASTRIC BANDING WITH HIATAL HERNIA REPAIR N/A Nov 2008  . SPHINCTEROTOMY  07/01/2018   Procedure: SPHINCTEROTOMY;  Surgeon: Ladene Artist, MD;  Location: [facility];  Service: Endoscopy;;  . TUBAL LIGATION      I have reviewed the social history and family history with the patient and they are unchanged from previous note.  ALLERGIES:  is allergic to oxaliplatin; codeine; lipitor [atorvastatin]; and ace inhibitors.  MEDICATIONS:  Current Outpatient Medications  Medication Sig Dispense Refill  . acetaminophen (TYLENOL) 500 MG tablet Take 1,000 mg by mouth 2 (two) times a day.    . anastrozole (ARIMIDEX) 1 MG tablet TAKE 1 TABLET BY MOUTH EVERY DAY (Patient taking differently: Take 1 mg by mouth daily. ) 90 tablet 3  . aspirin EC 81 MG tablet Take 81 mg by mouth daily.    Marland Kitchen atenolol (TENORMIN) 100 MG tablet TAKE 1 TABLET BY MOUTH EVERY DAY (Patient taking differently: Take 50 mg by mouth daily after supper. ) 90 tablet 3  . Cholecalciferol (VITAMIN D3) 5000 UNITS CAPS Take by mouth daily  after supper.     . fluconazole (DIFLUCAN) 100 MG tablet Take 1 tablet (100 mg total) by mouth daily. 10 tablet 0  . lidocaine (XYLOCAINE) 2 % solution Use as directed 5 mLs in the mouth or throat every 3 (three) hours as needed for mouth pain. 200 mL 1  . lidocaine-prilocaine (EMLA) cream Apply to affected area once 30 g 3  . magic mouthwash SOLN Take 5 mLs by mouth 4 (four) times daily as needed for mouth pain. 240 mL 1  . nystatin (MYCOSTATIN) 100000 UNIT/ML suspension Take 5 mLs (500,000 Units total) by mouth 3 (three) times daily. 60 mL 0  . ondansetron (ZOFRAN) 8 MG tablet Take 1 tablet (8 mg total) by mouth 2 (two) times daily as needed. Start on day 3 after chemotherapy. 30 tablet 1  . potassium chloride SA (K-DUR) 20 MEQ tablet Take 1 tablet (20 mEq total) by mouth 2 (two) times daily. 60 tablet 1  . prochlorperazine (COMPAZINE) 10 MG tablet Take 1 tablet (10  mg total) by mouth every 6 (six) hours as needed (Nausea or vomiting). 30 tablet 1  . promethazine (PHENERGAN) 25 MG tablet Take 1/2-1 tab every 6 hours as needed for nausea. 60 tablet 3  . rosuvastatin (CRESTOR) 10 MG tablet Take 1 tablet Daily for Cholesterol 90 tablet 3  . traMADol (ULTRAM) 50 MG tablet Take 1 tablet (50 mg total) by mouth every 6 (six) hours as needed. 60 tablet 0  . traZODone (DESYREL) 150 MG tablet Take 1/2 to 1 tablet 1 to 2 hours before Bedtime (Patient taking differently: Take 150 mg by mouth. ) 90 tablet 3  . verapamil (CALAN) 80 MG tablet Take 1 tablet 2 x /day with meal for BP (Patient taking differently: Take 80 mg by mouth 2 (two) times daily with a meal. ) 180 tablet 3  . Zinc 50 MG TABS Take 50 mg by mouth daily after supper.     No current facility-administered medications for this visit.    Facility-Administered Medications Ordered in Other Visits  Medication Dose Route Frequency Provider Last Rate Last Dose  . 0.9 %  sodium chloride infusion   Intravenous Continuous Truitt Merle, MD   Stopped at  09/21/18 1555  . 0.9 %  sodium chloride infusion   Intravenous Continuous Truitt Merle, MD   Stopped at 09/21/18 1609  . fluorouracil (ADRUCIL) 4,650 mg in sodium chloride 0.9 % 57 mL chemo infusion  2,400 mg/m2 (Treatment Plan Recorded) Intravenous 1 day or 1 dose Truitt Merle, MD   4,650 mg at 09/21/18 1601    PHYSICAL EXAMINATION: ECOG PERFORMANCE STATUS: 1 - Symptomatic but completely ambulatory  Vitals:   09/21/18 1217  BP: (!) 143/83  Pulse: 85  Resp: 17  Temp: 98 F (36.7 C)  SpO2: 98%   Filed Weights   09/21/18 1217  Weight: 173 lb 9.6 oz (78.7 kg)    GENERAL:alert, no distress and comfortable SKIN: skin color, texture, turgor are normal, no rashes or significant lesions EYES: normal, Conjunctiva are pink and non-injected, sclera clear NECK: supple, thyroid normal size, non-tender, without nodularity LYMPH:  no palpable lymphadenopathy in the cervical, axillary  LUNGS: clear to auscultation and percussion with normal breathing effort HEART: regular rate & rhythm and no murmurs and no lower extremity edema ABDOMEN:abdomen soft, non-tender and normal bowel sounds Musculoskeletal:no cyanosis of digits and no clubbing  NEURO: alert & oriented x 3 with fluent speech, no focal motor/sensory deficits  LABORATORY DATA:  I have reviewed the data as listed CBC Latest Ref Rng & Units 09/21/2018 09/05/2018 08/22/2018  WBC 4.0 - 10.5 K/uL 6.2 4.0 22.0(H)  Hemoglobin 12.0 - 15.0 g/dL 11.2(L) 11.4(L) 12.4  Hematocrit 36.0 - 46.0 % 34.4(L) 35.0(L) 36.9  Platelets 150 - 400 K/uL 204 221 251     CMP Latest Ref Rng & Units 09/21/2018 09/05/2018 08/22/2018  Glucose 70 - 99 mg/dL 112(H) 137(H) 113(H)  BUN 8 - 23 mg/dL 7(L) 11 12  Creatinine 0.44 - 1.00 mg/dL 0.69 0.74 0.83  Sodium 135 - 145 mmol/L 140 139 141  Potassium 3.5 - 5.1 mmol/L 3.9 4.2 3.0(LL)  Chloride 98 - 111 mmol/L 102 105 102  CO2 22 - 32 mmol/L 28 24 26   Calcium 8.9 - 10.3 mg/dL 9.0 9.1 8.8(L)  Total Protein 6.5 - 8.1 g/dL  6.2(L) 6.0(L) 6.0(L)  Total Bilirubin 0.3 - 1.2 mg/dL 0.5 0.3 0.4  Alkaline Phos 38 - 126 U/L 131(H) 106 131(H)  AST 15 - 41 U/L 25 15 21  ALT 0 - 44 U/L 16 15 19       RADIOGRAPHIC STUDIES: I have personally reviewed the radiological images as listed and agreed with the findings in the report. No results found.   ASSESSMENT & PLAN:  Taylor Oconnor is a 68 y.o. female with   1. 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 She was found to havea 2.4cmmass in head of pancreas, initial ERCP with stent placement , EUSand FNA biopsy of her pancreatic massfrom 07/14/18 confirmed high grade adenocarcinoma.Herpancreatic mass on the MRI and the EUS was borderline resectable, due to the invasion of portal vein. -I personally reviewed her PET from 07/21/18 with pt and it shows significant increase and hypermetabolic multiple liver lesions and porta hepatic LN which is compatible with liver metastasis. -I started her on intensive chemo FOLFIRINOX q2weeks on 07/26/18. Oxaliplatin held after C2 due to severe infusion reaction, she tolerated subsequent FOLFIRI very well  -Labs reviewed, CBC and CMP WNL except HG 11.2, BG 112, Protein 6.2, Albumin 3.3. CA 19.9 still pending. Overall adeqaute to proceed with C5 Irinotecan and 69f.  -Her CA19.9 has dropped since she started chemo, she is probably responding to chemo  -f/u in 2 weeks, will repeat CT abd/pel with contrast in 3-4 weeks    2. Epigastric Abdominal pain, secondary to #1  -Improved since stent placed on 07/01/18 -Her epigastric pain is intermittent but significant. On 07/26/18 I prescribed Tramadol which she uses BID as needed. She can otherwise continue Tylenol for mild pain.  -Pain now near resolved   3.Obstructive jaundice,s/p stent placement on 07/01/18  -LFTs have improved after stent placement. Her jaundice has resolved.  -LFTs and Tbili normal now. Will monitor    4. Low appetite, Weight loss, Nausea  -Years ago she had gastric band placed. She is interested in having it removed.  -She has lost 32 pounds over 5 months secondary to her recent cancer.  -She takes Zofran and Phenergan for her nausea, continue  5. H/o of right breast cancer, RS 16, ER/PR +, HER2- -Managed by Med Onc Dr MJana Hakim -Diagnosed in 11/2014. S/p right lumpectomy and adjuvant radiation.  -She has been on Anastrozole since 05/2015. She is tolerating well. Will continue   6. Genetic testing was negative for pathogenetic mutations  -Given her history of cancer and pancreatic cancer, she underwent genetic testing, which was negative   7. HTN, DM -DM is mostly controlled, she is off medication -On Verapamil and atenolol -Her Kidney function tests have been normal lately. -will monitor BP and BG closely on chemo    PLAN: -Labs reviewed and adequate to proceed with C5 irinotecan and 5FU today   -Lab, flush, f/u and chemo in 2 weeks -CTabd/pel with contrast in 3-4 weeks     No problem-specific Assessment & Plan notes found for this encounter.   Orders Placed This Encounter  Procedures  . CT Abdomen Pelvis W Contrast    Standing Status:   Future    Standing Expiration Date:   09/21/2019    Order Specific Question:   If indicated for the ordered procedure, I authorize the administration of contrast media per Radiology protocol    Answer:   Yes    Order Specific Question:   Preferred imaging location?    [facility]   Order Specific Question:   Is Oral Contrast requested for this exam?    Answer:  Yes, Per Radiology protocol    Order Specific Question:   Radiology Contrast Protocol - do NOT remove file path    Answer:   [email or web address]   All questions were answered. The patient knows to call the clinic with any problems, questions or concerns. No barriers to learning was detected.      Truitt Merle, MD 09/21/2018   I,  Joslyn Devon, am acting as scribe for Truitt Merle, MD.   I have reviewed the above documentation for accuracy and completeness, and I agree with the above.

## 2018-09-22 ENCOUNTER — Telehealth: Payer: Self-pay | Admitting: Hematology

## 2018-09-22 LAB — CANCER ANTIGEN 19-9: CA 19-9: 32 U/mL (ref 0–35)

## 2018-09-22 NOTE — Telephone Encounter (Signed)
Scheduled appt per 9/9 los.  Spoke with patient and she is awrae of her appt date and time.

## 2018-09-23 ENCOUNTER — Other Ambulatory Visit: Payer: Self-pay

## 2018-09-23 ENCOUNTER — Inpatient Hospital Stay: Payer: Medicare Other

## 2018-09-23 VITALS — BP 126/64 | HR 68 | Temp 98.7°F | Resp 18

## 2018-09-23 DIAGNOSIS — Z170 Estrogen receptor positive status [ER+]: Secondary | ICD-10-CM | POA: Diagnosis not present

## 2018-09-23 DIAGNOSIS — I700 Atherosclerosis of aorta: Secondary | ICD-10-CM | POA: Diagnosis not present

## 2018-09-23 DIAGNOSIS — E039 Hypothyroidism, unspecified: Secondary | ICD-10-CM | POA: Diagnosis not present

## 2018-09-23 DIAGNOSIS — Z7982 Long term (current) use of aspirin: Secondary | ICD-10-CM | POA: Diagnosis not present

## 2018-09-23 DIAGNOSIS — Z5111 Encounter for antineoplastic chemotherapy: Secondary | ICD-10-CM | POA: Diagnosis not present

## 2018-09-23 DIAGNOSIS — K831 Obstruction of bile duct: Secondary | ICD-10-CM | POA: Diagnosis not present

## 2018-09-23 DIAGNOSIS — C250 Malignant neoplasm of head of pancreas: Secondary | ICD-10-CM

## 2018-09-23 DIAGNOSIS — I10 Essential (primary) hypertension: Secondary | ICD-10-CM | POA: Diagnosis not present

## 2018-09-23 DIAGNOSIS — C50411 Malignant neoplasm of upper-outer quadrant of right female breast: Secondary | ICD-10-CM | POA: Diagnosis not present

## 2018-09-23 DIAGNOSIS — E785 Hyperlipidemia, unspecified: Secondary | ICD-10-CM | POA: Diagnosis not present

## 2018-09-23 DIAGNOSIS — R110 Nausea: Secondary | ICD-10-CM | POA: Diagnosis not present

## 2018-09-23 DIAGNOSIS — K219 Gastro-esophageal reflux disease without esophagitis: Secondary | ICD-10-CM | POA: Diagnosis not present

## 2018-09-23 DIAGNOSIS — Z9221 Personal history of antineoplastic chemotherapy: Secondary | ICD-10-CM | POA: Diagnosis not present

## 2018-09-23 DIAGNOSIS — R1013 Epigastric pain: Secondary | ICD-10-CM | POA: Diagnosis not present

## 2018-09-23 DIAGNOSIS — Z923 Personal history of irradiation: Secondary | ICD-10-CM | POA: Diagnosis not present

## 2018-09-23 DIAGNOSIS — Z23 Encounter for immunization: Secondary | ICD-10-CM | POA: Diagnosis not present

## 2018-09-23 DIAGNOSIS — E1165 Type 2 diabetes mellitus with hyperglycemia: Secondary | ICD-10-CM | POA: Diagnosis not present

## 2018-09-23 DIAGNOSIS — Z95828 Presence of other vascular implants and grafts: Secondary | ICD-10-CM

## 2018-09-23 DIAGNOSIS — Z79899 Other long term (current) drug therapy: Secondary | ICD-10-CM | POA: Diagnosis not present

## 2018-09-23 DIAGNOSIS — Z79811 Long term (current) use of aromatase inhibitors: Secondary | ICD-10-CM | POA: Diagnosis not present

## 2018-09-23 MED ORDER — HEPARIN SOD (PORK) LOCK FLUSH 100 UNIT/ML IV SOLN
500.0000 [IU] | Freq: Once | INTRAVENOUS | Status: AC
  Administered 2018-09-23: 500 [IU]
  Filled 2018-09-23: qty 5

## 2018-09-23 MED ORDER — SODIUM CHLORIDE 0.9% FLUSH
10.0000 mL | Freq: Once | INTRAVENOUS | Status: AC
  Administered 2018-09-23: 10 mL
  Filled 2018-09-23: qty 10

## 2018-09-23 MED ORDER — SODIUM CHLORIDE 0.9 % IV SOLN: INTRAVENOUS | Status: AC

## 2018-09-23 MED ORDER — SODIUM CHLORIDE 0.9 % IV SOLN
INTRAVENOUS | Status: DC
  Administered 2018-09-23: 14:00:00 via INTRAVENOUS
  Filled 2018-09-23 (×2): qty 250

## 2018-09-23 NOTE — Addendum Note (Signed)
Addended by: Truitt Merle on: 09/23/2018 02:41 PM   Modules accepted: Orders

## 2018-09-23 NOTE — Progress Notes (Signed)
Pt was scheduled for pump D/C but Dr Burr Medico ordered fluids. I unhooked chemo line and flushed port only.

## 2018-09-23 NOTE — Patient Instructions (Signed)

## 2018-09-24 ENCOUNTER — Encounter: Payer: Self-pay | Admitting: Hematology

## 2018-09-25 ENCOUNTER — Other Ambulatory Visit: Payer: Self-pay | Admitting: Hematology

## 2018-09-25 DIAGNOSIS — C250 Malignant neoplasm of head of pancreas: Secondary | ICD-10-CM

## 2018-09-26 ENCOUNTER — Other Ambulatory Visit: Payer: Self-pay | Admitting: Nurse Practitioner

## 2018-09-26 DIAGNOSIS — C250 Malignant neoplasm of head of pancreas: Secondary | ICD-10-CM

## 2018-09-26 MED ORDER — PROCHLORPERAZINE MALEATE 10 MG PO TABS: 10.0000 mg | ORAL_TABLET | Freq: Four times a day (QID) | ORAL | 3 refills | Status: DC | PRN

## 2018-09-26 MED ORDER — TRAMADOL HCL 50 MG PO TABS: 50.0000 mg | ORAL_TABLET | Freq: Four times a day (QID) | ORAL | 0 refills | Status: DC | PRN

## 2018-09-28 ENCOUNTER — Other Ambulatory Visit: Payer: Self-pay | Admitting: Nurse Practitioner

## 2018-09-28 NOTE — Telephone Encounter (Signed)
Refill request

## 2018-09-29 ENCOUNTER — Other Ambulatory Visit: Payer: Self-pay

## 2018-09-29 DIAGNOSIS — K1231 Oral mucositis (ulcerative) due to antineoplastic therapy: Secondary | ICD-10-CM

## 2018-09-29 MED ORDER — MAGIC MOUTHWASH: 5.0000 mL | Freq: Four times a day (QID) | ORAL | 1 refills | Status: DC | PRN

## 2018-09-29 NOTE — Progress Notes (Signed)
[place]   Telephone:([phone]) [phone] Fax:([phone]) [phone]   Clinic Follow up Note   Patient Care Team: Unk Pinto, MD as PCP - General (Internal Medicine) Alphonsa Overall, MD as Consulting Physician (General Surgery) Magrinat, Virgie Dad, MD as Consulting Physician (Oncology) Thea Silversmith, MD as Consulting Physician (Radiation Oncology) Sylvan Cheese, NP as Nurse Practitioner (Hematology and Oncology) Mauri Pole, MD as Consulting Physician (Gastroenterology) Arna Snipe, RN as Oncology Nurse Navigator  Date of Service:  10/03/2018  CHIEF COMPLAINT: F/u ofAdenocarcinoma of Pancreatic Head  SUMMARY OF ONCOLOGIC HISTORY: Oncology History  Malignant neoplasm of upper-outer quadrant of right breast in female, estrogen receptor positive ([place])  11/15/2014 Mammogram   Right breast: mass warranting further evaluation   11/27/2014 Breast US   0.4 x 0.4 x 0.6 cm irregular hypoechoic mass within the right breast 10 o'clock position 7 cm from the nipple. Additionally there is a 0.7 x 0.8 x 0.8 cm taller than wide irregular hypoechoic mass with posterior acoustic shadowing within the right breast.   11/28/2014 Initial Biopsy   Right breast core needle biopsies, 2 masses: both invasive ductal carcinoma, grade 1, ER+ (100%), PR+ (85% -larger), HER2/neu negative, Ki67 5-10%.  Smaller mass PR-   11/28/2014 Clinical Stage   Stage IA: T1b N0   12/28/2014 Definitive Surgery   Right lumpectomies/SLNB: invasive ductal carcinoma, grade 2, repeat HER2/neu negative, 0/2 LN   12/28/2014 Pathologic Stage   Stage IA: npT1c pN0   12/28/2014 Oncotype testing   RS 16 (10% ROR)   02/18/2015 - 04/03/2015 Radiation Therapy    Right breast / 45 Gray at 1.8 [place] Cables per fraction x 25 fractions; right breast boost / 16 Gray at [employer] per fraction x 8 fractions   05/13/2015 -  Anti-estrogen oral therapy   Anastrozole 1 mg daily.   05/30/2015 Survivorship   SCP mailed to  patient in lieu of in person visit   08/01/2018 Genetic Testing   Negative genetic testing on the common hereditary cancer panel.  The Common Hereditary Gene Panel offered by [facility] includes sequencing and/or deletion duplication testing of the following 48 genes: APC, ATM, AXIN2, BARD1, BMPR1A, BRCA1, BRCA2, BRIP1, CDH1, CDK4, CDKN2A (p14ARF), CDKN2A (p16INK4a), CHEK2, CTNNA1, DICER1, EPCAM (Deletion/duplication testing only), GREM1 (promoter region deletion/duplication testing only), KIT, MEN1, MLH1, MSH2, MSH3, MSH6, MUTYH, NBN, NF1, NHTL1, PALB2, PDGFRA, PMS2, POLD1, POLE, PTEN, RAD50, RAD51C, RAD51D, RNF43, SDHB, SDHC, SDHD, SMAD4, SMARCA4. STK11, TP53, TSC1, TSC2, and VHL.  The following genes were evaluated for sequence changes only: SDHA and HOXB13 c.251G>A variant only. The report date is August 01, 2018.   Pancreatic cancer ([place])  06/30/2018 Imaging   CT AP WO Contrast 06/30/18  IMPRESSION: 1. New common bile duct dilatation and intrahepatic bile duct dilatation. New fullness of the pancreatic head which could represent rapidly developing obstructive neoplastic process or sequela of recent localized pancreatitis. Recommend further characterization with MRCP or ERCP. 2. No other acute or significant findings. No bowel obstruction or evidence of bowel wall inflammation. No free fluid or abscess collection. No free intraperitoneal air. 3. Additional chronic/incidental findings detailed above.   07/01/2018 Procedure   ERCP by Dr Fuller Plan on 07/01/18  IMPRESSION - A single severe segmental biliary stricture was found in the lower third of the common bile duct. The stricture was malignant appearing. Brushed. - The entire biliary tree proximal to the stricture was dilated, secondary to the stricture. - Prior cholecystectomy. - A biliary sphincterotomy was performed. - One plastic  stent was placed into the common bile duct.   07/01/2018 Pathology Results   Diagnosis 07/01/18 BILE DUCT BRUSHING  (SPECIMEN 1 OF 1 COLLECTED 07/01/2018) SUSPICIOUS FOR ADENOCARCINOMA. SEE COMMENT.   07/14/2018 Initial Diagnosis   Pancreatic cancer ([place])   07/14/2018 Procedure   EUS by Dr Ardis Hughs on 07/14/18 IMPRESSION PENDING    07/14/2018 Cancer Staging   Staging form: Exocrine Pancreas, AJCC 8th Edition - Clinical: Stage IB (cT2, cN0, cM0) - Signed by Truitt Merle, MD on 07/15/2018   07/21/2018 PET scan   IMPRESSION: 1. Mass in head of pancreas is intensely hypermetabolic compatible with primary pancreatic adenocarcinoma. 2. Enlarged and FDG avid porta hepatic lymph node compatible with metastatic adenopathy. 3. Extensive FDG avid liver metastases involve both lobes of liver and exhibit marked progression when compared with MRI from 07/01/2018. 4.  Aortic Atherosclerosis (ICD10-I70.0).   07/26/2018 -  Chemotherapy   FOLFIRINOX q2weeks starting 07/26/18      CURRENT THERAPY:  FOLFIRINOX q2weeks starting 07/26/18. Oxaliplatin D/c since C3 due to reaction. Now on FOLFIRI.  INTERVAL HISTORY:  Taylor Oconnor is here for a follow up and treatment. She presents to the clinic alone. She notes she is doing better. She notes she recently had another episode of oral thrush and started using Nystatin mouthwash. This is now resolved. She denies numbness or tingling and denies diarrhea. She is managing FOLFIRI better.    REVIEW OF SYSTEMS:   Constitutional: Denies fevers, chills or abnormal weight loss Eyes: Denies blurriness of vision Ears, nose, mouth, throat, and face: Denies mucositis or sore throat Respiratory: Denies cough, dyspnea or wheezes Cardiovascular: Denies palpitation, chest discomfort or lower extremity swelling Gastrointestinal:  Denies nausea, heartburn or change in bowel habits Skin: Denies abnormal skin rashes Lymphatics: Denies new lymphadenopathy or easy bruising Neurological:Denies numbness, tingling or new weaknesses Behavioral/Psych: Mood is stable, no new changes  All other systems  were reviewed with the patient and are negative.  MEDICAL HISTORY:  Past Medical History:  Diagnosis Date  . Allergy    SEASONAL  . Anxiety   . At risk for difficult insertion of breathing tube   . Breast cancer of upper-outer quadrant of right female breast ([place]) 12/03/2014  . Common bile duct (CBD) obstruction   . 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 Family history of breast cancer   . GERD (gastroesophageal reflux disease)    hx - resolved with lap band surgery  . H/O hiatal hernia    resolved with band surgery  . H/O laparoscopic adjustable gastric banding 2010  . History of kidney stones 02/2018  . Hyperlipidemia   . Hypertension   . Hypokalemia   . Insomnia    tx with zanax  . Obesity   . Pancreatic mass   . PCOS (polycystic ovarian syndrome)   . Personal history of radiation therapy   . 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 Type II or unspecified type diabetes mellitus without mention of complication, not stated as uncontrolled 12/14/2012   no meds, diet controlled  . Wears contact lenses   . Wears glasses     SURGICAL HISTORY: Past Surgical History:  Procedure Laterality Date  . BACK SURGERY     cervical  . BILIARY STENT PLACEMENT N/A 07/01/2018   Procedure: BILIARY STENT PLACEMENT;  Surgeon: Ladene Artist, MD;  Location: [facility];  Service: Endoscopy;  Laterality: N/A;  cbd brushing  . BREAST BIOPSY    . BREAST LUMPECTOMY  Right 2016  . BREAST LUMPECTOMY WITH NEEDLE LOCALIZATION AND AXILLARY SENTINEL LYMPH NODE BX Right 12/28/2014   Procedure: RIGHT BREAST LUMPECTOMY WITH TWO (2) NEEDLE LOCALIZATION AND AXILLARY SENTINEL LYMPH NODE BX;  Surgeon: Alphonsa Overall, MD;  Location: [facility];  Service: General;  Laterality: Right;  . CESAREAN SECTION     x 1  . CHOLECYSTECTOMY    . COLONOSCOPY  09/29/2004   normal  . CYSTOSCOPY WITH RETROGRADE PYELOGRAM, URETEROSCOPY AND STENT PLACEMENT Right 03/24/2018   Procedure:  CYSTOSCOPY WITH RETROGRADE PYELOGRAM, URETEROSCOPY AND STENT PLACEMENT;  Surgeon: Franchot Gallo, MD;  Location: [facility];  Service: Urology;  Laterality: Right;  1 HR  . DIAGNOSTIC LAPAROSCOPY    . ERCP N/A 07/01/2018   Procedure: ENDOSCOPIC RETROGRADE CHOLANGIOPANCREATOGRAPHY (ERCP);  Surgeon: Ladene Artist, MD;  Location: [facility];  Service: Endoscopy;  Laterality: N/A;  . ESOPHAGOGASTRODUODENOSCOPY (EGD) WITH PROPOFOL N/A 07/14/2018   Procedure: ESOPHAGOGASTRODUODENOSCOPY (EGD) WITH PROPOFOL;  Surgeon: Milus Banister, MD;  Location: [facility];  Service: Endoscopy;  Laterality: N/A;  . EUS N/A 07/14/2018   Procedure: UPPER ENDOSCOPIC ULTRASOUND (EUS) RADIAL;  Surgeon: Milus Banister, MD;  Location: [facility];  Service: Endoscopy;  Laterality: N/A;  . FINE NEEDLE ASPIRATION  07/14/2018   Procedure: FINE NEEDLE ASPIRATION (FNA) RADIAL;  Surgeon: Milus Banister, MD;  Location: [facility];  Service: Endoscopy;;  . HERNIA REPAIR  2008  . IR IMAGING GUIDED PORT INSERTION  07/20/2018  . LAPAROSCOPIC APPENDECTOMY N/A 08/06/2012   Procedure: APPENDECTOMY LAPAROSCOPIC;  Surgeon: Earnstine Regal, MD;  Location: [facility];  Service: General;  Laterality: N/A;  . LAPAROSCOPIC GASTRIC BANDING WITH HIATAL HERNIA REPAIR N/A Nov 2008  . SPHINCTEROTOMY  07/01/2018   Procedure: SPHINCTEROTOMY;  Surgeon: Ladene Artist, MD;  Location: [facility];  Service: Endoscopy;;  . TUBAL LIGATION      I have reviewed the social history and family history with the patient and they are unchanged from previous note.  ALLERGIES:  is allergic to oxaliplatin; codeine; lipitor [atorvastatin]; and ace inhibitors.  MEDICATIONS:  Current Outpatient Medications  Medication Sig Dispense Refill  . acetaminophen (TYLENOL) 500 MG tablet Take 1,000 mg by mouth 2 (two) times a day.    . anastrozole (ARIMIDEX) 1 MG tablet TAKE 1 TABLET BY MOUTH EVERY DAY (Patient taking differently: Take 1 mg by mouth  daily. ) 90 tablet 3  . aspirin EC 81 MG tablet Take 81 mg by mouth daily.    Marland Kitchen atenolol (TENORMIN) 100 MG tablet TAKE 1 TABLET BY MOUTH EVERY DAY (Patient taking differently: Take 50 mg by mouth daily after supper. ) 90 tablet 3  . Cholecalciferol (VITAMIN D3) 5000 UNITS CAPS Take by mouth daily after supper.     . fluconazole (DIFLUCAN) 100 MG tablet Take 1 tablet (100 mg total) by mouth daily. 10 tablet 0  . lidocaine (XYLOCAINE) 2 % solution Use as directed 5 mLs in the mouth or throat every 3 (three) hours as needed for mouth pain. 200 mL 1  . lidocaine-prilocaine (EMLA) cream Apply to affected area once 30 g 3  . magic mouthwash SOLN Take 5 mLs by mouth 4 (four) times daily as needed for mouth pain. 240 mL 1  . nystatin (MYCOSTATIN) 100000 UNIT/ML suspension TAKE 5 ML BY MOUTH THREE TIMES DAILY AS DIRECTED 60 mL 0  . ondansetron (ZOFRAN) 8 MG tablet Take 1 tablet (8 mg total) by mouth 2 (two) times daily as needed.  Start on day 3 after chemotherapy. 30 tablet 1  . potassium chloride SA (K-DUR) 20 MEQ tablet Take 1 tablet (20 mEq total) by mouth 2 (two) times daily. 60 tablet 1  . prochlorperazine (COMPAZINE) 10 MG tablet Take 1 tablet (10 mg total) by mouth every 6 (six) hours as needed (Nausea or vomiting). 30 tablet 3  . promethazine (PHENERGAN) 25 MG tablet Take 1/2-1 tab every 6 hours as needed for nausea. 60 tablet 3  . rosuvastatin (CRESTOR) 10 MG tablet Take 1 tablet Daily for Cholesterol 90 tablet 3  . traMADol (ULTRAM) 50 MG tablet Take 1 tablet (50 mg total) by mouth every 6 (six) hours as needed. 60 tablet 0  . traZODone (DESYREL) 150 MG tablet Take 1/2 to 1 tablet 1 to 2 hours before Bedtime (Patient taking differently: Take 150 mg by mouth. ) 90 tablet 3  . verapamil (CALAN) 80 MG tablet Take 1 tablet 2 x /day with meal for BP (Patient taking differently: Take 80 mg by mouth 2 (two) times daily with a meal. ) 180 tablet 3  . Zinc 50 MG TABS Take 50 mg by mouth daily after supper.      No current facility-administered medications for this visit.     PHYSICAL EXAMINATION: ECOG PERFORMANCE STATUS: 1 - Symptomatic but completely ambulatory  Vitals:   10/03/18 1059  BP: 125/70  Pulse: 98  Resp: 20  Temp: 98.9 F (37.2 C)  SpO2: 98%   Filed Weights   10/03/18 1059  Weight: 171 lb 11.2 oz (77.9 kg)    GENERAL:alert, no distress and comfortable SKIN: skin color, texture, turgor are normal, no rashes or significant lesions EYES: normal, Conjunctiva are pink and non-injected, sclera clear NECK: supple, thyroid normal size, non-tender, without nodularity LYMPH:  no palpable lymphadenopathy in the cervical, axillary  LUNGS: clear to auscultation and percussion with normal breathing effort HEART: regular rate & rhythm and no murmurs and no lower extremity edema ABDOMEN:abdomen soft, non-tender and normal bowel sounds Musculoskeletal:no cyanosis of digits and no clubbing  NEURO: alert & oriented x 3 with fluent speech, no focal motor/sensory deficits  LABORATORY DATA:  I have reviewed the data as listed CBC Latest Ref Rng & Units 10/03/2018 09/21/2018 09/05/2018  WBC 4.0 - 10.5 K/uL 4.9 6.2 4.0  Hemoglobin 12.0 - 15.0 g/dL 11.0(L) 11.2(L) 11.4(L)  Hematocrit 36.0 - 46.0 % 33.9(L) 34.4(L) 35.0(L)  Platelets 150 - 400 K/uL 213 204 221     CMP Latest Ref Rng & Units 10/03/2018 09/21/2018 09/05/2018  Glucose 70 - 99 mg/dL 136(H) 112(H) 137(H)  BUN 8 - 23 mg/dL 11 7(L) 11  Creatinine 0.44 - 1.00 mg/dL 0.76 0.69 0.74  Sodium 135 - 145 mmol/L 140 140 139  Potassium 3.5 - 5.1 mmol/L 4.1 3.9 4.2  Chloride 98 - 111 mmol/L 103 102 105  CO2 22 - 32 mmol/L _0 Calcium 8.9 - 10.3 mg/dL 9.2 9.0 9.1  Total Protein 6.5 - 8.1 g/dL 6.4(L) 6.2(L) 6.0(L)  Total Bilirubin 0.3 - 1.2 mg/dL 0.3 0.5 0.3  Alkaline Phos 38 - 126 U/L 146(H) 131(H) 106  AST 15 - 41 U/L _1 ALT 0 - 44 U/L _2 RADIOGRAPHIC STUDIES: I have personally reviewed the radiological  images as listed and agreed with the findings in the report. No results found.   ASSESSMENT & PLAN:  Taylor Oconnor is a 68 y.o. female with   1.  Adenocarcinoma of Pancreatic Head, High grade, cT2NxM1 withmetastasisto liver  -She was newly diagnosed in 06/2018.She was found to havea 2.4cmmass in head of pancreas, initial ERCP with stent placement,EUSand FNA biopsy of her pancreatic massfrom 7/2/20confirmedhigh grade adenocarcinoma.Herpancreatic mass on the MRI and the EUS was borderline resectable, due to the invasion of portal vein. -I personally reviewed herPET from 7/9/20with pt and itshowssignificant increase andhypermetabolicmultiple liver lesions and porta hepatic LN which is compatible with liver metastasis. -I started her on intensivechemoFOLFIRINOX q2weeks on 07/26/18.Oxaliplatin held after C2 due to severe infusion reaction and poor tolerance overall,  she tolerated subsequent FOLFIRI very well. I discussed continuing until she can no longer tolerate or has disease progresses.  -Labs reviewed, CBC and CMP WNL except Hg 11, BG 136, Protein 6.4, Alk Phos 146. Latest Ca 19.9 is normal. Overall adequate to proceed with FOLFIRI today.  -Her tumor marker CA 19.9 has dropped to normal range, which is likely indicating good response to chemotherapy.  She is clinically doing well. -f/u in 2 weeks, with restaging CT abd/pel  2.EpigastricAbdominal pain, secondary to #1  -Improved since stent placed on 07/01/18 -Her epigastric pain is intermittent, she uses tramadol as needed  -Pain now near resolved   3.Obstructive jaundice,s/p stent placement on 07/01/18  -LFTs have improved after stent placement. Her jaundice has resolved.  -LFTs and Tbili normal now, except Alk Phos 146 today (10/03/18). Will monitor   4. Low appetite, Weight loss, Nausea  -Years ago she had gastric band placed. She is interested in having it removed.  -She has lost 32 pounds over 5 months secondary  to her recent cancer.  -She takes Zofran and Phenergan for her nausea, continue -Weight mostly stable now.   5. H/o of right breast cancer, RS 16, ER/PR +, HER2- -Managed by Med Onc Dr [name]  -Diagnosed in 11/2014. S/p right lumpectomy and adjuvant radiation.  -She has been on Anastrozole since 05/2015. She is tolerating well. Will continue   6. Genetic testing was negative for pathogenetic mutations   7. HTN, DM -DM is mostly controlled, she is off medication -On Verapamil and atenolol -Her Kidney function tests have been normal lately. -BP normal today, BG 136 (10/03/18)  PLAN: -Labs reviewed and adequate to proceed with C6 FOLFIRI today   -Pump D/c with 2 hr IVFs on 9/23  -CT AP on 10/14/18 scheduled  -Lab, flush, f/u and chemo in 2, 4, 6, and 8 weeks with Pump DC and IVFs on day 3   No problem-specific Assessment & Plan notes found for this encounter.   No orders of the defined types were placed in this encounter.  All questions were answered. The patient knows to call the clinic with any problems, questions or concerns. No barriers to learning was detected. I spent 20 minutes counseling the patient face to face. The total time spent in the appointment was 25 minutes and more than 50% was on counseling and review of test results     Truitt Merle, MD 10/03/2018   I, Joslyn Devon, am acting as scribe for Truitt Merle, MD.   I have reviewed the above documentation for accuracy and completeness, and I agree with the above.

## 2018-09-29 NOTE — Telephone Encounter (Signed)
Patient left VM requesting approval for a refill for magic mouthwash and for it to be sent to [facility] on [address]. Prescription sent and patient called back with update. Patient mentioned that she was experiencing sores in her mouth and throat, but that the MMW helped her last time. Denied any other concerns or needs at this time.

## 2018-10-03 ENCOUNTER — Encounter: Payer: Self-pay | Admitting: Hematology

## 2018-10-03 ENCOUNTER — Inpatient Hospital Stay (HOSPITAL_BASED_OUTPATIENT_CLINIC_OR_DEPARTMENT_OTHER): Payer: Medicare Other | Admitting: Hematology

## 2018-10-03 ENCOUNTER — Inpatient Hospital Stay: Payer: Medicare Other

## 2018-10-03 ENCOUNTER — Telehealth: Payer: Self-pay | Admitting: Hematology

## 2018-10-03 ENCOUNTER — Other Ambulatory Visit: Payer: Self-pay

## 2018-10-03 VITALS — BP 125/70 | HR 98 | Temp 98.9°F | Resp 20 | Ht 63.0 in | Wt 171.7 lb

## 2018-10-03 DIAGNOSIS — K219 Gastro-esophageal reflux disease without esophagitis: Secondary | ICD-10-CM | POA: Diagnosis not present

## 2018-10-03 DIAGNOSIS — I700 Atherosclerosis of aorta: Secondary | ICD-10-CM | POA: Diagnosis not present

## 2018-10-03 DIAGNOSIS — C250 Malignant neoplasm of head of pancreas: Secondary | ICD-10-CM

## 2018-10-03 DIAGNOSIS — Z79811 Long term (current) use of aromatase inhibitors: Secondary | ICD-10-CM | POA: Diagnosis not present

## 2018-10-03 DIAGNOSIS — Z95828 Presence of other vascular implants and grafts: Secondary | ICD-10-CM

## 2018-10-03 DIAGNOSIS — E559 Vitamin D deficiency, unspecified: Secondary | ICD-10-CM

## 2018-10-03 DIAGNOSIS — K831 Obstruction of bile duct: Secondary | ICD-10-CM | POA: Diagnosis not present

## 2018-10-03 DIAGNOSIS — I10 Essential (primary) hypertension: Secondary | ICD-10-CM | POA: Diagnosis not present

## 2018-10-03 DIAGNOSIS — Z79899 Other long term (current) drug therapy: Secondary | ICD-10-CM | POA: Diagnosis not present

## 2018-10-03 DIAGNOSIS — E785 Hyperlipidemia, unspecified: Secondary | ICD-10-CM | POA: Diagnosis not present

## 2018-10-03 DIAGNOSIS — R1013 Epigastric pain: Secondary | ICD-10-CM | POA: Diagnosis not present

## 2018-10-03 DIAGNOSIS — C50411 Malignant neoplasm of upper-outer quadrant of right female breast: Secondary | ICD-10-CM | POA: Diagnosis not present

## 2018-10-03 DIAGNOSIS — Z9221 Personal history of antineoplastic chemotherapy: Secondary | ICD-10-CM | POA: Diagnosis not present

## 2018-10-03 DIAGNOSIS — Z5111 Encounter for antineoplastic chemotherapy: Secondary | ICD-10-CM | POA: Diagnosis not present

## 2018-10-03 DIAGNOSIS — Z170 Estrogen receptor positive status [ER+]: Secondary | ICD-10-CM

## 2018-10-03 DIAGNOSIS — E1165 Type 2 diabetes mellitus with hyperglycemia: Secondary | ICD-10-CM | POA: Diagnosis not present

## 2018-10-03 DIAGNOSIS — Z7982 Long term (current) use of aspirin: Secondary | ICD-10-CM | POA: Diagnosis not present

## 2018-10-03 DIAGNOSIS — E039 Hypothyroidism, unspecified: Secondary | ICD-10-CM | POA: Diagnosis not present

## 2018-10-03 DIAGNOSIS — Z923 Personal history of irradiation: Secondary | ICD-10-CM | POA: Diagnosis not present

## 2018-10-03 DIAGNOSIS — R110 Nausea: Secondary | ICD-10-CM | POA: Diagnosis not present

## 2018-10-03 DIAGNOSIS — Z23 Encounter for immunization: Secondary | ICD-10-CM | POA: Diagnosis not present

## 2018-10-03 LAB — CMP (CANCER CENTER ONLY)
ALT: 22 U/L (ref 0–44)
AST: 30 U/L (ref 15–41)
Albumin: 3.5 g/dL (ref 3.5–5.0)
Alkaline Phosphatase: 146 U/L — ABNORMAL HIGH (ref 38–126)
Anion gap: 10 (ref 5–15)
BUN: 11 mg/dL (ref 8–23)
CO2: 27 mmol/L (ref 22–32)
Calcium: 9.2 mg/dL (ref 8.9–10.3)
Chloride: 103 mmol/L (ref 98–111)
Creatinine: 0.76 mg/dL (ref 0.44–1.00)
GFR, Est AFR Am: >60 mL/min (ref >60)
GFR, Estimated: >60 mL/min (ref >60)
Glucose, Bld: 136 mg/dL — ABNORMAL HIGH (ref 70–99)
Potassium: 4.1 mmol/L (ref 3.5–5.1)
Sodium: 140 mmol/L (ref 135–145)
Total Bilirubin: 0.3 mg/dL (ref 0.3–1.2)
Total Protein: 6.4 g/dL — ABNORMAL LOW (ref 6.5–8.1)

## 2018-10-03 LAB — CBC WITH DIFFERENTIAL (CANCER CENTER ONLY)
Abs Immature Granulocytes: 0.01 10*3/uL (ref 0.00–0.07)
Basophils Absolute: 0 10*3/uL (ref 0.0–0.1)
Basophils Relative: 0 %
Eosinophils Absolute: 0 10*3/uL (ref 0.0–0.5)
Eosinophils Relative: 1 %
HCT: 33.9 % — ABNORMAL LOW (ref 36.0–46.0)
Hemoglobin: 11 g/dL — ABNORMAL LOW (ref 12.0–15.0)
Immature Granulocytes: 0 %
Lymphocytes Relative: 23 %
Lymphs Abs: 1.1 10*3/uL (ref 0.7–4.0)
MCH: 32.4 pg (ref 26.0–34.0)
MCHC: 32.4 g/dL (ref 30.0–36.0)
MCV: 99.7 fL (ref 80.0–100.0)
Monocytes Absolute: 0.4 10*3/uL (ref 0.1–1.0)
Monocytes Relative: 9 %
Neutro Abs: 3.3 10*3/uL (ref 1.7–7.7)
Neutrophils Relative %: 67 %
Platelet Count: 213 10*3/uL (ref 150–400)
RBC: 3.4 MIL/uL — ABNORMAL LOW (ref 3.87–5.11)
RDW: 16.7 % — ABNORMAL HIGH (ref 11.5–15.5)
WBC Count: 4.9 10*3/uL (ref 4.0–10.5)
nRBC: 0 % (ref 0.0–0.2)

## 2018-10-03 MED ORDER — PALONOSETRON HCL INJECTION 0.25 MG/5ML
0.2500 mg | Freq: Once | INTRAVENOUS | Status: AC
  Administered 2018-10-03: 0.25 mg via INTRAVENOUS

## 2018-10-03 MED ORDER — ATROPINE SULFATE 1 MG/ML IJ SOLN
0.5000 mg | Freq: Once | INTRAMUSCULAR | Status: AC | PRN
  Administered 2018-10-03: 0.5 mg via INTRAVENOUS

## 2018-10-03 MED ORDER — INFLUENZA VAC A&B SA ADJ QUAD 0.5 ML IM PRSY
0.5000 mL | PREFILLED_SYRINGE | Freq: Once | INTRAMUSCULAR | Status: AC
  Administered 2018-10-03: 0.5 mL via INTRAMUSCULAR

## 2018-10-03 MED ORDER — SODIUM CHLORIDE 0.9 % IV SOLN
Freq: Once | INTRAVENOUS | Status: AC
  Administered 2018-10-03: 12:00:00 via INTRAVENOUS
  Filled 2018-10-03: qty 5

## 2018-10-03 MED ORDER — ATROPINE SULFATE 1 MG/ML IJ SOLN
INTRAMUSCULAR | Status: AC
  Filled 2018-10-03: qty 1

## 2018-10-03 MED ORDER — SODIUM CHLORIDE 0.9 % IV SOLN
2400.0000 mg/m2 | INTRAVENOUS | Status: DC
  Administered 2018-10-03: 4650 mg via INTRAVENOUS
  Filled 2018-10-03: qty 93

## 2018-10-03 MED ORDER — LEUCOVORIN CALCIUM INJECTION 350 MG
400.0000 mg/m2 | Freq: Once | INTRAVENOUS | Status: AC
  Administered 2018-10-03: 13:00:00 772 mg via INTRAVENOUS
  Filled 2018-10-03: qty 38.6

## 2018-10-03 MED ORDER — SODIUM CHLORIDE 0.9 % IV SOLN
Freq: Once | INTRAVENOUS | Status: AC
  Administered 2018-10-03: 12:00:00 via INTRAVENOUS
  Filled 2018-10-03: qty 250

## 2018-10-03 MED ORDER — SODIUM CHLORIDE 0.9 % IV SOLN
Freq: Once | INTRAVENOUS | Status: DC
  Filled 2018-10-03: qty 250

## 2018-10-03 MED ORDER — PALONOSETRON HCL INJECTION 0.25 MG/5ML
INTRAVENOUS | Status: AC
  Filled 2018-10-03: qty 5

## 2018-10-03 MED ORDER — INFLUENZA VAC A&B SA ADJ QUAD 0.5 ML IM PRSY
PREFILLED_SYRINGE | INTRAMUSCULAR | Status: AC
  Filled 2018-10-03: qty 0.5

## 2018-10-03 MED ORDER — SODIUM CHLORIDE 0.9% FLUSH
10.0000 mL | Freq: Once | INTRAVENOUS | Status: AC
  Administered 2018-10-03: 10 mL
  Filled 2018-10-03: qty 10

## 2018-10-03 MED ORDER — IRINOTECAN HCL CHEMO INJECTION 100 MG/5ML
180.0000 mg/m2 | Freq: Once | INTRAVENOUS | Status: AC
  Administered 2018-10-03: 13:00:00 340 mg via INTRAVENOUS
  Filled 2018-10-03: qty 15

## 2018-10-03 NOTE — Telephone Encounter (Signed)
Scheduled appt per 9/21 los. °

## 2018-10-03 NOTE — Patient Instructions (Signed)
[facility] Discharge Instructions for Patients Receiving Chemotherapy  Today you received the following chemotherapy agents :  Irinotecan, Leucovorin, Fluorouracil.  To help prevent nausea and vomiting after your treatment, we encourage you to take your nausea medication as prescribed.   If you develop nausea and vomiting that is not controlled by your nausea medication, call the clinic.   BELOW ARE SYMPTOMS THAT SHOULD BE REPORTED IMMEDIATELY:  *FEVER GREATER THAN 100.5 F  *CHILLS WITH OR WITHOUT FEVER  NAUSEA AND VOMITING THAT IS NOT CONTROLLED WITH YOUR NAUSEA MEDICATION  *UNUSUAL SHORTNESS OF BREATH  *UNUSUAL BRUISING OR BLEEDING  TENDERNESS IN MOUTH AND THROAT WITH OR WITHOUT PRESENCE OF ULCERS  *URINARY PROBLEMS  *BOWEL PROBLEMS  UNUSUAL RASH Items with * indicate a potential emergency and should be followed up as soon as possible.  Feel free to call the clinic should you have any questions or concerns. The clinic phone number is [phone].  Please show the CHEMO ALERT CARD at check-in to the Emergency Department and triage nurse.   

## 2018-10-03 NOTE — Patient Instructions (Signed)

## 2018-10-04 ENCOUNTER — Telehealth: Payer: Self-pay | Admitting: Hematology

## 2018-10-04 NOTE — Telephone Encounter (Signed)
R/s appt per 9/21 sch message = pt is aware of appt

## 2018-10-05 ENCOUNTER — Other Ambulatory Visit: Payer: Self-pay

## 2018-10-05 ENCOUNTER — Inpatient Hospital Stay: Payer: Medicare Other

## 2018-10-05 VITALS — BP 114/60 | Temp 97.8°F

## 2018-10-05 DIAGNOSIS — E039 Hypothyroidism, unspecified: Secondary | ICD-10-CM | POA: Diagnosis not present

## 2018-10-05 DIAGNOSIS — I10 Essential (primary) hypertension: Secondary | ICD-10-CM | POA: Diagnosis not present

## 2018-10-05 DIAGNOSIS — E1165 Type 2 diabetes mellitus with hyperglycemia: Secondary | ICD-10-CM | POA: Diagnosis not present

## 2018-10-05 DIAGNOSIS — I700 Atherosclerosis of aorta: Secondary | ICD-10-CM | POA: Diagnosis not present

## 2018-10-05 DIAGNOSIS — Z923 Personal history of irradiation: Secondary | ICD-10-CM | POA: Diagnosis not present

## 2018-10-05 DIAGNOSIS — R1013 Epigastric pain: Secondary | ICD-10-CM | POA: Diagnosis not present

## 2018-10-05 DIAGNOSIS — Z79811 Long term (current) use of aromatase inhibitors: Secondary | ICD-10-CM | POA: Diagnosis not present

## 2018-10-05 DIAGNOSIS — E785 Hyperlipidemia, unspecified: Secondary | ICD-10-CM | POA: Diagnosis not present

## 2018-10-05 DIAGNOSIS — C50411 Malignant neoplasm of upper-outer quadrant of right female breast: Secondary | ICD-10-CM | POA: Diagnosis not present

## 2018-10-05 DIAGNOSIS — Z79899 Other long term (current) drug therapy: Secondary | ICD-10-CM | POA: Diagnosis not present

## 2018-10-05 DIAGNOSIS — K831 Obstruction of bile duct: Secondary | ICD-10-CM | POA: Diagnosis not present

## 2018-10-05 DIAGNOSIS — Z7982 Long term (current) use of aspirin: Secondary | ICD-10-CM | POA: Diagnosis not present

## 2018-10-05 DIAGNOSIS — C250 Malignant neoplasm of head of pancreas: Secondary | ICD-10-CM

## 2018-10-05 DIAGNOSIS — Z9221 Personal history of antineoplastic chemotherapy: Secondary | ICD-10-CM | POA: Diagnosis not present

## 2018-10-05 DIAGNOSIS — Z170 Estrogen receptor positive status [ER+]: Secondary | ICD-10-CM | POA: Diagnosis not present

## 2018-10-05 DIAGNOSIS — Z23 Encounter for immunization: Secondary | ICD-10-CM | POA: Diagnosis not present

## 2018-10-05 DIAGNOSIS — Z5111 Encounter for antineoplastic chemotherapy: Secondary | ICD-10-CM | POA: Diagnosis not present

## 2018-10-05 DIAGNOSIS — K219 Gastro-esophageal reflux disease without esophagitis: Secondary | ICD-10-CM | POA: Diagnosis not present

## 2018-10-05 DIAGNOSIS — R110 Nausea: Secondary | ICD-10-CM | POA: Diagnosis not present

## 2018-10-05 MED ORDER — SODIUM CHLORIDE 0.9% FLUSH
10.0000 mL | INTRAVENOUS | Status: DC | PRN
  Administered 2018-10-05: 16:00:00 10 mL
  Filled 2018-10-05: qty 10

## 2018-10-05 MED ORDER — SODIUM CHLORIDE 0.9 % IV SOLN
INTRAVENOUS | Status: DC
  Administered 2018-10-05: 14:00:00 via INTRAVENOUS
  Filled 2018-10-05 (×2): qty 250

## 2018-10-05 MED ORDER — HEPARIN SOD (PORK) LOCK FLUSH 100 UNIT/ML IV SOLN
500.0000 [IU] | Freq: Once | INTRAVENOUS | Status: AC | PRN
  Administered 2018-10-05: 500 [IU]
  Filled 2018-10-05: qty 5

## 2018-10-13 ENCOUNTER — Other Ambulatory Visit: Payer: Self-pay | Admitting: Nurse Practitioner

## 2018-10-13 DIAGNOSIS — C250 Malignant neoplasm of head of pancreas: Secondary | ICD-10-CM

## 2018-10-13 NOTE — Progress Notes (Signed)
[place]   Telephone:([phone]) [phone] Fax:(336) [phone]   Clinic Follow up Note   Patient Care Team: Unk Pinto, MD as PCP - General (Internal Medicine) Alphonsa Overall, MD as Consulting Physician (General Surgery) Magrinat, Virgie Dad, MD as Consulting Physician (Oncology) Thea Silversmith, MD as Consulting Physician (Radiation Oncology) Sylvan Cheese, NP as Nurse Practitioner (Hematology and Oncology) Mauri Pole, MD as Consulting Physician (Gastroenterology) Arna Snipe, RN as Oncology Nurse Navigator  Date of Service:  10/17/2018  CHIEF COMPLAINT: F/u ofAdenocarcinoma of Pancreatic Head  SUMMARY OF ONCOLOGIC HISTORY: Oncology History  Malignant neoplasm of upper-outer quadrant of right breast in female, estrogen receptor positive ([place])  11/15/2014 Mammogram   Right breast: mass warranting further evaluation   11/27/2014 Breast US   0.4 x 0.4 x 0.6 cm irregular hypoechoic mass within the right breast 10 o'clock position 7 cm from the nipple. Additionally there is a 0.7 x 0.8 x 0.8 cm taller than wide irregular hypoechoic mass with posterior acoustic shadowing within the right breast.   11/28/2014 Initial Biopsy   Right breast core needle biopsies, 2 masses: both invasive ductal carcinoma, grade 1, ER+ (100%), PR+ (85% -larger), HER2/neu negative, Ki67 5-10%.  Smaller mass PR-   11/28/2014 Clinical Stage   Stage IA: T1b N0   12/28/2014 Definitive Surgery   Right lumpectomies/SLNB: invasive ductal carcinoma, grade 2, repeat HER2/neu negative, 0/2 LN   12/28/2014 Pathologic Stage   Stage IA: npT1c pN0   12/28/2014 Oncotype testing   RS 16 (10% ROR)   02/18/2015 - 04/03/2015 Radiation Therapy    Right breast / 45 Gray at 1.8 [place] Cables per fraction x 25 fractions; right breast boost / 16 Gray at [employer] per fraction x 8 fractions   05/13/2015 -  Anti-estrogen oral therapy   Anastrozole 1 mg daily.   05/30/2015 Survivorship   SCP mailed to  patient in lieu of in person visit   08/01/2018 Genetic Testing   Negative genetic testing on the common hereditary cancer panel.  The Common Hereditary Gene Panel offered by [facility] includes sequencing and/or deletion duplication testing of the following 48 genes: APC, ATM, AXIN2, BARD1, BMPR1A, BRCA1, BRCA2, BRIP1, CDH1, CDK4, CDKN2A (p14ARF), CDKN2A (p16INK4a), CHEK2, CTNNA1, DICER1, EPCAM (Deletion/duplication testing only), GREM1 (promoter region deletion/duplication testing only), KIT, MEN1, MLH1, MSH2, MSH3, MSH6, MUTYH, NBN, NF1, NHTL1, PALB2, PDGFRA, PMS2, POLD1, POLE, PTEN, RAD50, RAD51C, RAD51D, RNF43, SDHB, SDHC, SDHD, SMAD4, SMARCA4. STK11, TP53, TSC1, TSC2, and VHL.  The following genes were evaluated for sequence changes only: SDHA and HOXB13 c.251G>A variant only. The report date is August 01, 2018.   Pancreatic cancer ([place])  06/30/2018 Imaging   CT AP WO Contrast 06/30/18  IMPRESSION: 1. New common bile duct dilatation and intrahepatic bile duct dilatation. New fullness of the pancreatic head which could represent rapidly developing obstructive neoplastic process or sequela of recent localized pancreatitis. Recommend further characterization with MRCP or ERCP. 2. No other acute or significant findings. No bowel obstruction or evidence of bowel wall inflammation. No free fluid or abscess collection. No free intraperitoneal air. 3. Additional chronic/incidental findings detailed above.   07/01/2018 Procedure   ERCP by Dr Fuller Plan on 07/01/18  IMPRESSION - A single severe segmental biliary stricture was found in the lower third of the common bile duct. The stricture was malignant appearing. Brushed. - The entire biliary tree proximal to the stricture was dilated, secondary to the stricture. - Prior cholecystectomy. - A biliary sphincterotomy was performed. - One plastic  stent was placed into the common bile duct.   07/01/2018 Pathology Results   Diagnosis 07/01/18 BILE DUCT BRUSHING  (SPECIMEN 1 OF 1 COLLECTED 07/01/2018) SUSPICIOUS FOR ADENOCARCINOMA. SEE COMMENT.   07/14/2018 Initial Diagnosis   Pancreatic cancer ([place])   07/14/2018 Procedure   EUS by Dr Ardis Hughs on 07/14/18 IMPRESSION PENDING    07/14/2018 Cancer Staging   Staging form: Exocrine Pancreas, AJCC 8th Edition - Clinical: Stage IB (cT2, cN0, cM0) - Signed by Truitt Merle, MD on 07/15/2018   07/21/2018 PET scan   IMPRESSION: 1. Mass in head of pancreas is intensely hypermetabolic compatible with primary pancreatic adenocarcinoma. 2. Enlarged and FDG avid porta hepatic lymph node compatible with metastatic adenopathy. 3. Extensive FDG avid liver metastases involve both lobes of liver and exhibit marked progression when compared with MRI from 07/01/2018. 4.  Aortic Atherosclerosis (ICD10-I70.0).   07/26/2018 - 10/03/2018 Chemotherapy   FOLFIRINOX q2weeks starting 07/26/18. Last cycle 10/03/18 due to disease progression.    10/14/2018 Imaging   CT AP W Contrast  IMPRESSION: Marked increase in diffuse liver metastases.   New mild portacaval and peripancreatic lymphadenopathy, consistent with metastatic disease.   Mild decrease in size of pancreatic head mass.   Diffuse portal vein thrombosis, new since prior exam. These results will be called to the ordering clinician or representative by the Radiologist Assistant, and communication documented in the PACS or zVision Dashboard.      Chemotherapy   PENDING Second-line Gemcitibine and Abraxane 2 weeks on/1 week off   10/20/2018 -  Chemotherapy   The patient had PACLitaxel-protein bound (ABRAXANE) chemo infusion 225 mg, 125 mg/m2 = 225 mg, Intravenous,  Once, 0 of 4 cycles gemcitabine (GEMZAR) 1,862 mg in sodium chloride 0.9 % 250 mL chemo infusion, 1,000 mg/m2 = 1,862 mg, Intravenous,  Once, 0 of 4 cycles  for chemotherapy treatment.       CURRENT THERAPY:  PENDING Second-line Gemcitabine and Abraxane 2 weeks on/1 week off  INTERVAL HISTORY:  Taylor Oconnor is here for a follow up and treatment. She presents to the clinic alone. She is doing well. She felt last cycle felt longer to recover and has been more fatigued. She denied nausea and diarrhea. She notes she did heaved her medication yesterday and this morning with Zofran and Tramadol. She notes her pharmacy wont fill her latest nausea because they are waiting for authorization. She is out of it after this morning. She notes she will tell her husband about her current progression.  She notes when she urinates it trickles slowly, no strong stream. She notes normal color. She does have leaking occasionally.     REVIEW OF SYSTEMS:   Constitutional: Denies fevers, chills or abnormal weight loss Eyes: Denies blurriness of vision Ears, nose, mouth, throat, and face: Denies mucositis or sore throat Respiratory: Denies cough, dyspnea or wheezes Cardiovascular: Denies palpitation, chest discomfort or lower extremity swelling Gastrointestinal:  Denies nausea, heartburn or change in bowel habits UA: (+) Trickling urine output  Skin: Denies abnormal skin rashes Lymphatics: Denies new lymphadenopathy or easy bruising Neurological:Denies numbness, tingling or new weaknesses Behavioral/Psych: Mood is stable, no new changes  All other systems were reviewed with the patient and are negative.  MEDICAL HISTORY:  Past Medical History:  Diagnosis Date  . Allergy    SEASONAL  . Anxiety   . At risk for difficult insertion of breathing tube   . Breast cancer of upper-outer quadrant of right female breast ([place]) 12/03/2014  . Common  bile duct (CBD) obstruction   . 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 Family history of breast cancer   . GERD (gastroesophageal reflux disease)    hx - resolved with lap band surgery  . H/O hiatal hernia    resolved with band surgery  . H/O laparoscopic adjustable gastric banding 2010  . History of kidney stones 02/2018  . Hyperlipidemia   . Hypertension   .  Hypokalemia   . Insomnia    tx with zanax  . Obesity   . Pancreatic mass   . PCOS (polycystic ovarian syndrome)   . Personal history of radiation therapy   . 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 Type II or unspecified type diabetes mellitus without mention of complication, not stated as uncontrolled 12/14/2012   no meds, diet controlled  . Wears contact lenses   . Wears glasses     SURGICAL HISTORY: Past Surgical History:  Procedure Laterality Date  . BACK SURGERY     cervical  . BILIARY STENT PLACEMENT N/A 07/01/2018   Procedure: BILIARY STENT PLACEMENT;  Surgeon: Ladene Artist, MD;  Location: [facility];  Service: Endoscopy;  Laterality: N/A;  cbd brushing  . BREAST BIOPSY    . BREAST LUMPECTOMY Right 2016  . BREAST LUMPECTOMY WITH NEEDLE LOCALIZATION AND AXILLARY SENTINEL LYMPH NODE BX Right 12/28/2014   Procedure: RIGHT BREAST LUMPECTOMY WITH TWO (2) NEEDLE LOCALIZATION AND AXILLARY SENTINEL LYMPH NODE BX;  Surgeon: Alphonsa Overall, MD;  Location: [facility];  Service: General;  Laterality: Right;  . CESAREAN SECTION     x 1  . CHOLECYSTECTOMY    . COLONOSCOPY  09/29/2004   normal  . CYSTOSCOPY WITH RETROGRADE PYELOGRAM, URETEROSCOPY AND STENT PLACEMENT Right 03/24/2018   Procedure: CYSTOSCOPY WITH RETROGRADE PYELOGRAM, URETEROSCOPY AND STENT PLACEMENT;  Surgeon: Franchot Gallo, MD;  Location[facility];  Service: Urology;  Laterality: Right;  1 HR  . DIAGNOSTIC LAPAROSCOPY    . ERCP N/A 07/01/2018   Procedure: ENDOSCOPIC RETROGRADE CHOLANGIOPANCREATOGRAPHY (ERCP);  Surgeon: Ladene Artist, MD;  Location: [facility];  Service: Endoscopy;  Laterality: N/A;  . ESOPHAGOGASTRODUODENOSCOPY (EGD) WITH PROPOFOL N/A 07/14/2018   Procedure: ESOPHAGOGASTRODUODENOSCOPY (EGD) WITH PROPOFOL;  Surgeon: Milus Banister, MD;  Location: [facility];  Service: Endoscopy;  Laterality: N/A;  . EUS N/A 07/14/2018   Procedure: UPPER ENDOSCOPIC  ULTRASOUND (EUS) RADIAL;  Surgeon: Milus Banister, MD;  Location: [facility];  Service: Endoscopy;  Laterality: N/A;  . FINE NEEDLE ASPIRATION  07/14/2018   Procedure: FINE NEEDLE ASPIRATION (FNA) RADIAL;  Surgeon: Milus Banister, MD;  Location: [facility];  Service: Endoscopy;;  . HERNIA REPAIR  2008  . IR IMAGING GUIDED PORT INSERTION  07/20/2018  . LAPAROSCOPIC APPENDECTOMY N/A 08/06/2012   Procedure: APPENDECTOMY LAPAROSCOPIC;  Surgeon: Earnstine Regal, MD;  Location: [facility];  Service: General;  Laterality: N/A;  . LAPAROSCOPIC GASTRIC BANDING WITH HIATAL HERNIA REPAIR N/A Nov 2008  . SPHINCTEROTOMY  07/01/2018   Procedure: SPHINCTEROTOMY;  Surgeon: Ladene Artist, MD;  Location: [facility];  Service: Endoscopy;;  . TUBAL LIGATION      I have reviewed the social history and family history with the patient and they are unchanged from previous note.  ALLERGIES:  is allergic to oxaliplatin; codeine; lipitor [atorvastatin]; and ace inhibitors.  MEDICATIONS:  Current Outpatient Medications  Medication Sig Dispense Refill  . acetaminophen (TYLENOL) 500 MG tablet Take 1,000 mg by mouth 2 (two)  times a day.    . anastrozole (ARIMIDEX) 1 MG tablet TAKE 1 TABLET BY MOUTH EVERY DAY (Patient taking differently: Take 1 mg by mouth daily. ) 90 tablet 3  . aspirin EC 81 MG tablet Take 81 mg by mouth daily.    Marland Kitchen atenolol (TENORMIN) 100 MG tablet TAKE 1 TABLET BY MOUTH EVERY DAY (Patient taking differently: Take 50 mg by mouth daily after supper. ) 90 tablet 3  . Cholecalciferol (VITAMIN D3) 5000 UNITS CAPS Take by mouth daily after supper.     . fluconazole (DIFLUCAN) 100 MG tablet Take 1 tablet (100 mg total) by mouth daily. 10 tablet 0  . lidocaine (XYLOCAINE) 2 % solution Use as directed 5 mLs in the mouth or throat every 3 (three) hours as needed for mouth pain. 200 mL 1  . magic mouthwash SOLN Take 5 mLs by mouth 4 (four) times daily as needed for mouth pain. 240 mL 1  . nystatin  (MYCOSTATIN) 100000 UNIT/ML suspension TAKE 5 ML BY MOUTH THREE TIMES DAILY AS DIRECTED 60 mL 0  . potassium chloride SA (K-DUR) 20 MEQ tablet Take 1 tablet (20 mEq total) by mouth 2 (two) times daily. 60 tablet 1  . promethazine (PHENERGAN) 25 MG tablet Take 1/2-1 tab every 6 hours as needed for nausea. 60 tablet 3  . rosuvastatin (CRESTOR) 10 MG tablet Take 1 tablet Daily for Cholesterol 90 tablet 3  . traMADol (ULTRAM) 50 MG tablet Take 1 tablet (50 mg total) by mouth every 6 (six) hours as needed. 60 tablet 0  . traZODone (DESYREL) 150 MG tablet Take 1/2 to 1 tablet 1 to 2 hours before Bedtime (Patient taking differently: Take 150 mg by mouth. ) 90 tablet 3  . verapamil (CALAN) 80 MG tablet Take 1 tablet 2 x /day with meal for BP (Patient taking differently: Take 80 mg by mouth 2 (two) times daily with a meal. ) 180 tablet 3  . Zinc 50 MG TABS Take 50 mg by mouth daily after supper.    . Rivaroxaban 15 & 20 MG TBPK Follow package directions: Take one 17m tablet by mouth twice a day. On day 22, switch to one 221mtablet once a day. Take with food. 51 each 0   No current facility-administered medications for this visit.     PHYSICAL EXAMINATION: ECOG PERFORMANCE STATUS: 1 - Symptomatic but completely ambulatory  Vitals:   10/17/18 0814  BP: 104/72  Pulse: 100  Resp: 18  Temp: 98.5 F (36.9 C)  SpO2: 98%   Filed Weights   10/17/18 0814  Weight: 169 lb (76.7 kg)    GENERAL:alert, no distress and comfortable SKIN: skin color, texture, turgor are normal, no rashes or significant lesions EYES: normal, Conjunctiva are pink and non-injected, sclera clear  NECK: supple, thyroid normal size, non-tender, without nodularity LYMPH:  no palpable lymphadenopathy in the cervical, axillary  LUNGS: clear to auscultation and percussion with normal breathing effort HEART: regular rate & rhythm and no murmurs and no lower extremity edema ABDOMEN:abdomen soft, non-tender and normal bowel sounds  Musculoskeletal:no cyanosis of digits and no clubbing  NEURO: alert & oriented x 3 with fluent speech, no focal motor/sensory deficits  LABORATORY DATA:  I have reviewed the data as listed CBC Latest Ref Rng & Units 10/14/2018 10/03/2018 09/21/2018  WBC 4.0 - 10.5 K/uL 4.2 4.9 6.2  Hemoglobin 12.0 - 15.0 g/dL 10.3(L) 11.0(L) 11.2(L)  Hematocrit 36.0 - 46.0 % 31.8(L) 33.9(L) 34.4(L)  Platelets 150 -  400 K/uL 244 213 204     CMP Latest Ref Rng & Units 10/14/2018 10/03/2018 09/21/2018  Glucose 70 - 99 mg/dL 116(H) 136(H) 112(H)  BUN 8 - 23 mg/dL 14 11 7(L)  Creatinine 0.44 - 1.00 mg/dL 0.72 0.76 0.69  Sodium 135 - 145 mmol/L 138 140 140  Potassium 3.5 - 5.1 mmol/L 4.1 4.1 3.9  Chloride 98 - 111 mmol/L 100 103 102  CO2 22 - 32 mmol/L 28 27 28   Calcium 8.9 - 10.3 mg/dL 9.1 9.2 9.0  Total Protein 6.5 - 8.1 g/dL 6.4(L) 6.4(L) 6.2(L)  Total Bilirubin 0.3 - 1.2 mg/dL 0.4 0.3 0.5  Alkaline Phos 38 - 126 U/L 163(H) 146(H) 131(H)  AST 15 - 41 U/L 42(H) 30 25  ALT 0 - 44 U/L 23 22 16       RADIOGRAPHIC STUDIES: I have personally reviewed the radiological images as listed and agreed with the findings in the report. No results found.   ASSESSMENT & PLAN:  LARENA OHNEMUS is a 68 y.o. female with   1. Adenocarcinoma of Pancreatic Head, High grade, cT2NxM1 withdiffuse metastasisto liver  -She was newly diagnosed in 06/2018.She was found to havea 2.4cmmass in head of pancreas, initial ERCP with stent placement,EUSand FNA biopsy of her pancreatic massfrom 7/2/20confirmedhigh grade adenocarcinoma.Herpancreatic mass on the MRI and the EUS was borderline resectable, due to the invasion of portal vein. -She unfortunately developed diffuse liver metastasis in a months after her initial diagnosis -I started her onintensivechemoFOLFIRINOX q2weekson 07/26/18.Oxaliplatin heldafter C2due to severe infusion reaction and poor tolerance overall,  she tolerated subsequent FOLFIRI very well. S/p 6  cycles  -I personally reviewed and discussed her CT AP images from 10/14/18 and compared to her previous CT and PET.  Radiologist compared her recent CT to her previous MRI, which was done 1 months before her chemo initiation, and showed markable disease progression in liver.  It was hard to compare to previous PET scan which was done before her chemo initiation due to lack of CT contrast. But overall I do think she has had disease progression in liver compared to her PET.  -I recommend we change her treatment to second line chemo due to disease progression.  I recommend most common regiment gemcitabine and Abraxane combination, which I think she can tolerate well. Will do 2 weeks on /1 week off.   --Chemotherapy consent: Side effects including but does not limited to, fatigue, nausea, vomiting, diarrhea, hair loss, neuropathy, fluid retention, renal and kidney dysfunction, neutropenic fever, needed for blood transfusion, bleeding, were discussed with patient in great detail. She agrees to proceed. -the goal of therapy is palliative to control her disease.  -Labs reviewed from last week, CBC and CMP WNL except Hg 10.3, BG 116, protein 6.4, albumin 3.2, AST 42, alk phos 163. CA 19.9 normal.  -She will likely start second-line chemo later this week after insurance approval. F/u before C1D8    2.EpigastricAbdominal pain, secondary to #1  -Improved since stent placed on 07/01/18 -Her epigastric pain is intermittent, she uses tramadol as needed  -Pain now near resolved  3.Obstructive jaundice,s/p stent placement on 07/01/18  -LFTs have improved after stent placement. Her jaundice has resolved.  -Will monitor   4. Low appetite, Weight loss, Nausea  -Years ago she had gastric band placed. She is interested in having it removed.  -She has lost 32 pounds over 5 months secondary to her recent cancer.  -Her weight now mostly stable, with slow down trend.  -  She takes Zofran and Phenergan for her  nausea, continue.  -She notes she has recently had 2 episodes of dry heaving yesterday and today but otherwise no nausea lately.  -I will also call in sublingual Zofran today (10/17/18).   5. H/o of right breast cancer, RS 16, ER/PR +, HER2- -Managed by Med Onc Dr [name]  -Diagnosed in 11/2014. S/p right lumpectomy and adjuvant radiation.  -She has been on Anastrozole since 05/2015. She is tolerating well. Will continue   6. Genetic testingwas negative for pathogenetic mutations  7. HTN, DM -DM is mostly controlled, she is off medication -On Verapamil and atenolol -Her Kidney function tests have been normal lately.  8. Liver thrombosis  -Seen on 10/14/18 CT, new from previous scan. This is secondary to her cancer.  -I discussed this can cause Portal hypertension, ascites.  -I recommend blood thinner options to reduce her blood clots and lower risk of complications. I recommend daily Xarelto. I reviewed side effects, mainly bleeding. She is agreeable. She will take 24m loading dose for 3 weeks then proceed with daily 27m   9. Goal of care discussion  -We again discussed the incurable nature of her cancer, and the overall poor prognosis, especially if she does not have good response to chemotherapy or progress on chemo -The patient understands the goal of care is palliative. -she is full code now    PLAN: -I prescribed Xarelto and sublingual Zofran today  -CT scan reviewed today and shows overall disease progression.  -No chemo today, will change to second line gemcitabine and Abraxane weekly, 2 weeks on, and one-week off -Lab, flush, Gemcitabine and Abraxane on 10/8 or 10/9 and one week after, f/u with second dose    No problem-specific Assessment & Plan notes found for this encounter.   No orders of the defined types were placed in this encounter.  All questions were answered. The patient knows to call the clinic with any problems, questions or concerns. No barriers to  learning was detected. I spent 30 minutes counseling the patient face to face. The total time spent in the appointment was 40 minutes and more than 50% was on counseling and review of test results     YaTruitt MerleMD 10/17/2018   I, AmJoslyn Devonam acting as scribe for YaTruitt MerleMD.   I have reviewed the above documentation for accuracy and completeness, and I agree with the above.

## 2018-10-14 ENCOUNTER — Ambulatory Visit (HOSPITAL_COMMUNITY)
Admission: RE | Admit: 2018-10-14 | Discharge: 2018-10-14 | Disposition: A | Discharge status: Home / Self Care | Payer: Medicare Other | Source: Ambulatory Visit | Attending: Hematology | Admitting: Hematology

## 2018-10-14 ENCOUNTER — Inpatient Hospital Stay: Payer: Medicare Other

## 2018-10-14 ENCOUNTER — Inpatient Hospital Stay: Payer: Medicare Other | Attending: Hematology

## 2018-10-14 ENCOUNTER — Other Ambulatory Visit: Payer: Self-pay

## 2018-10-14 DIAGNOSIS — F419 Anxiety disorder, unspecified: Secondary | ICD-10-CM | POA: Insufficient documentation

## 2018-10-14 DIAGNOSIS — Z95828 Presence of other vascular implants and grafts: Secondary | ICD-10-CM

## 2018-10-14 DIAGNOSIS — I10 Essential (primary) hypertension: Secondary | ICD-10-CM | POA: Diagnosis not present

## 2018-10-14 DIAGNOSIS — E860 Dehydration: Secondary | ICD-10-CM | POA: Insufficient documentation

## 2018-10-14 DIAGNOSIS — D649 Anemia, unspecified: Secondary | ICD-10-CM | POA: Insufficient documentation

## 2018-10-14 DIAGNOSIS — D72829 Elevated white blood cell count, unspecified: Secondary | ICD-10-CM | POA: Diagnosis not present

## 2018-10-14 DIAGNOSIS — C50411 Malignant neoplasm of upper-outer quadrant of right female breast: Secondary | ICD-10-CM | POA: Insufficient documentation

## 2018-10-14 DIAGNOSIS — E039 Hypothyroidism, unspecified: Secondary | ICD-10-CM | POA: Diagnosis not present

## 2018-10-14 DIAGNOSIS — Z923 Personal history of irradiation: Secondary | ICD-10-CM | POA: Diagnosis not present

## 2018-10-14 DIAGNOSIS — Z170 Estrogen receptor positive status [ER+]: Secondary | ICD-10-CM | POA: Insufficient documentation

## 2018-10-14 DIAGNOSIS — Z7901 Long term (current) use of anticoagulants: Secondary | ICD-10-CM | POA: Diagnosis not present

## 2018-10-14 DIAGNOSIS — Z5111 Encounter for antineoplastic chemotherapy: Secondary | ICD-10-CM | POA: Insufficient documentation

## 2018-10-14 DIAGNOSIS — Z79811 Long term (current) use of aromatase inhibitors: Secondary | ICD-10-CM | POA: Diagnosis not present

## 2018-10-14 DIAGNOSIS — K5900 Constipation, unspecified: Secondary | ICD-10-CM | POA: Diagnosis not present

## 2018-10-14 DIAGNOSIS — E119 Type 2 diabetes mellitus without complications: Secondary | ICD-10-CM | POA: Diagnosis not present

## 2018-10-14 DIAGNOSIS — G893 Neoplasm related pain (acute) (chronic): Secondary | ICD-10-CM | POA: Insufficient documentation

## 2018-10-14 DIAGNOSIS — E669 Obesity, unspecified: Secondary | ICD-10-CM | POA: Insufficient documentation

## 2018-10-14 DIAGNOSIS — I81 Portal vein thrombosis: Secondary | ICD-10-CM | POA: Diagnosis not present

## 2018-10-14 DIAGNOSIS — K219 Gastro-esophageal reflux disease without esophagitis: Secondary | ICD-10-CM | POA: Diagnosis not present

## 2018-10-14 DIAGNOSIS — E785 Hyperlipidemia, unspecified: Secondary | ICD-10-CM | POA: Diagnosis not present

## 2018-10-14 DIAGNOSIS — C250 Malignant neoplasm of head of pancreas: Secondary | ICD-10-CM

## 2018-10-14 DIAGNOSIS — C259 Malignant neoplasm of pancreas, unspecified: Secondary | ICD-10-CM | POA: Diagnosis not present

## 2018-10-14 DIAGNOSIS — C787 Secondary malignant neoplasm of liver and intrahepatic bile duct: Secondary | ICD-10-CM | POA: Diagnosis not present

## 2018-10-14 DIAGNOSIS — Z7982 Long term (current) use of aspirin: Secondary | ICD-10-CM | POA: Insufficient documentation

## 2018-10-14 DIAGNOSIS — Z79899 Other long term (current) drug therapy: Secondary | ICD-10-CM | POA: Insufficient documentation

## 2018-10-14 DIAGNOSIS — R0602 Shortness of breath: Secondary | ICD-10-CM | POA: Insufficient documentation

## 2018-10-14 LAB — CBC WITH DIFFERENTIAL (CANCER CENTER ONLY)
Abs Immature Granulocytes: 0.01 10*3/uL (ref 0.00–0.07)
Basophils Absolute: 0 10*3/uL (ref 0.0–0.1)
Basophils Relative: 1 %
Eosinophils Absolute: 0 10*3/uL (ref 0.0–0.5)
Eosinophils Relative: 1 %
HCT: 31.8 % — ABNORMAL LOW (ref 36.0–46.0)
Hemoglobin: 10.3 g/dL — ABNORMAL LOW (ref 12.0–15.0)
Immature Granulocytes: 0 %
Lymphocytes Relative: 27 %
Lymphs Abs: 1.2 10*3/uL (ref 0.7–4.0)
MCH: 32.3 pg (ref 26.0–34.0)
MCHC: 32.4 g/dL (ref 30.0–36.0)
MCV: 99.7 fL (ref 80.0–100.0)
Monocytes Absolute: 0.7 10*3/uL (ref 0.1–1.0)
Monocytes Relative: 16 %
Neutro Abs: 2.3 10*3/uL (ref 1.7–7.7)
Neutrophils Relative %: 55 %
Platelet Count: 244 10*3/uL (ref 150–400)
RBC: 3.19 MIL/uL — ABNORMAL LOW (ref 3.87–5.11)
RDW: 16.4 % — ABNORMAL HIGH (ref 11.5–15.5)
WBC Count: 4.2 10*3/uL (ref 4.0–10.5)
nRBC: 0 % (ref 0.0–0.2)

## 2018-10-14 LAB — CMP (CANCER CENTER ONLY)
ALT: 23 U/L (ref 0–44)
AST: 42 U/L — ABNORMAL HIGH (ref 15–41)
Albumin: 3.2 g/dL — ABNORMAL LOW (ref 3.5–5.0)
Alkaline Phosphatase: 163 U/L — ABNORMAL HIGH (ref 38–126)
Anion gap: 10 (ref 5–15)
BUN: 14 mg/dL (ref 8–23)
CO2: 28 mmol/L (ref 22–32)
Calcium: 9.1 mg/dL (ref 8.9–10.3)
Chloride: 100 mmol/L (ref 98–111)
Creatinine: 0.72 mg/dL (ref 0.44–1.00)
GFR, Est AFR Am: >60 mL/min (ref >60)
GFR, Estimated: >60 mL/min (ref >60)
Glucose, Bld: 116 mg/dL — ABNORMAL HIGH (ref 70–99)
Potassium: 4.1 mmol/L (ref 3.5–5.1)
Sodium: 138 mmol/L (ref 135–145)
Total Bilirubin: 0.4 mg/dL (ref 0.3–1.2)
Total Protein: 6.4 g/dL — ABNORMAL LOW (ref 6.5–8.1)

## 2018-10-14 MED ORDER — HEPARIN SOD (PORK) LOCK FLUSH 100 UNIT/ML IV SOLN: 500.0000 [IU] | Freq: Once | INTRAVENOUS | Status: DC

## 2018-10-14 MED ORDER — SODIUM CHLORIDE (PF) 0.9 % IJ SOLN
INTRAMUSCULAR | Status: AC
  Filled 2018-10-14: qty 50

## 2018-10-14 MED ORDER — IOHEXOL 300 MG/ML  SOLN
100.0000 mL | Freq: Once | INTRAMUSCULAR | Status: AC | PRN
  Administered 2018-10-14: 100 mL via INTRAVENOUS

## 2018-10-14 MED ORDER — SODIUM CHLORIDE 0.9% FLUSH
10.0000 mL | Freq: Once | INTRAVENOUS | Status: AC
  Administered 2018-10-14: 12:00:00 10 mL
  Filled 2018-10-14: qty 10

## 2018-10-14 MED ORDER — HEPARIN SOD (PORK) LOCK FLUSH 100 UNIT/ML IV SOLN
INTRAVENOUS | Status: AC
  Filled 2018-10-14: qty 5

## 2018-10-14 NOTE — Patient Instructions (Signed)

## 2018-10-15 LAB — CANCER ANTIGEN 19-9: CA 19-9: 35 U/mL (ref 0–35)

## 2018-10-17 ENCOUNTER — Inpatient Hospital Stay (HOSPITAL_BASED_OUTPATIENT_CLINIC_OR_DEPARTMENT_OTHER): Payer: Medicare Other | Admitting: Hematology

## 2018-10-17 ENCOUNTER — Inpatient Hospital Stay: Payer: Medicare Other

## 2018-10-17 ENCOUNTER — Other Ambulatory Visit: Payer: Self-pay

## 2018-10-17 ENCOUNTER — Encounter: Payer: Self-pay | Admitting: Hematology

## 2018-10-17 VITALS — BP 104/72 | HR 100 | Temp 98.5°F | Resp 18 | Ht 63.0 in | Wt 169.0 lb

## 2018-10-17 DIAGNOSIS — D649 Anemia, unspecified: Secondary | ICD-10-CM | POA: Diagnosis not present

## 2018-10-17 DIAGNOSIS — C787 Secondary malignant neoplasm of liver and intrahepatic bile duct: Secondary | ICD-10-CM | POA: Diagnosis not present

## 2018-10-17 DIAGNOSIS — I81 Portal vein thrombosis: Secondary | ICD-10-CM

## 2018-10-17 DIAGNOSIS — E119 Type 2 diabetes mellitus without complications: Secondary | ICD-10-CM | POA: Diagnosis not present

## 2018-10-17 DIAGNOSIS — Z5111 Encounter for antineoplastic chemotherapy: Secondary | ICD-10-CM | POA: Diagnosis not present

## 2018-10-17 DIAGNOSIS — Z7982 Long term (current) use of aspirin: Secondary | ICD-10-CM | POA: Diagnosis not present

## 2018-10-17 DIAGNOSIS — C50411 Malignant neoplasm of upper-outer quadrant of right female breast: Secondary | ICD-10-CM

## 2018-10-17 DIAGNOSIS — Z79811 Long term (current) use of aromatase inhibitors: Secondary | ICD-10-CM | POA: Diagnosis not present

## 2018-10-17 DIAGNOSIS — C250 Malignant neoplasm of head of pancreas: Secondary | ICD-10-CM

## 2018-10-17 DIAGNOSIS — Z170 Estrogen receptor positive status [ER+]: Secondary | ICD-10-CM | POA: Diagnosis not present

## 2018-10-17 DIAGNOSIS — Z7901 Long term (current) use of anticoagulants: Secondary | ICD-10-CM | POA: Diagnosis not present

## 2018-10-17 DIAGNOSIS — G893 Neoplasm related pain (acute) (chronic): Secondary | ICD-10-CM | POA: Diagnosis not present

## 2018-10-17 DIAGNOSIS — E860 Dehydration: Secondary | ICD-10-CM | POA: Diagnosis not present

## 2018-10-17 DIAGNOSIS — R0602 Shortness of breath: Secondary | ICD-10-CM | POA: Diagnosis not present

## 2018-10-17 DIAGNOSIS — Z79899 Other long term (current) drug therapy: Secondary | ICD-10-CM | POA: Diagnosis not present

## 2018-10-17 DIAGNOSIS — K219 Gastro-esophageal reflux disease without esophagitis: Secondary | ICD-10-CM | POA: Diagnosis not present

## 2018-10-17 DIAGNOSIS — Z7189 Other specified counseling: Secondary | ICD-10-CM

## 2018-10-17 DIAGNOSIS — E039 Hypothyroidism, unspecified: Secondary | ICD-10-CM | POA: Diagnosis not present

## 2018-10-17 DIAGNOSIS — E785 Hyperlipidemia, unspecified: Secondary | ICD-10-CM | POA: Diagnosis not present

## 2018-10-17 DIAGNOSIS — I10 Essential (primary) hypertension: Secondary | ICD-10-CM | POA: Diagnosis not present

## 2018-10-17 DIAGNOSIS — D72829 Elevated white blood cell count, unspecified: Secondary | ICD-10-CM | POA: Diagnosis not present

## 2018-10-17 DIAGNOSIS — Z923 Personal history of irradiation: Secondary | ICD-10-CM | POA: Diagnosis not present

## 2018-10-17 DIAGNOSIS — K5900 Constipation, unspecified: Secondary | ICD-10-CM | POA: Diagnosis not present

## 2018-10-17 MED ORDER — RIVAROXABAN (XARELTO) VTE STARTER PACK (15 & 20 MG): ORAL_TABLET | ORAL | 0 refills | Status: DC

## 2018-10-17 MED ORDER — ONDANSETRON 8 MG PO TBDP: 8.0000 mg | ORAL_TABLET | Freq: Three times a day (TID) | ORAL | 2 refills | Status: DC | PRN

## 2018-10-17 NOTE — Progress Notes (Signed)
ON PATHWAY REGIMEN - Pancreatic Adenocarcinoma  No Change  Continue With Treatment as Ordered.     A cycle is every 14 days:     Oxaliplatin      Leucovorin      Irinotecan      Fluorouracil   **Always confirm dose/schedule in your pharmacy ordering system**  Patient Characteristics: No Distant Metastases, Borderline Resectable or High Risk, Potentially Resectable, Primary Neoadjuvant Therapy, PS = 0, 1 Current evidence of distant metastases<= No AJCC T Category: T2 AJCC N Category: N0 AJCC M Category: M0 AJCC 8 Stage Grouping: IB ECOG Performance Status: 1 Intent of Therapy: Curative Intent, Discussed with Patient

## 2018-10-17 NOTE — Progress Notes (Signed)
DISCONTINUE ON PATHWAY REGIMEN - Pancreatic Adenocarcinoma     A cycle is every 14 days:     Oxaliplatin      Leucovorin      Irinotecan      Fluorouracil   **Always confirm dose/schedule in your pharmacy ordering system**  REASON: Continuation Of Treatment PRIOR TREATMENT: PANOS94: mFOLFIRINOX q14 Days x 4 Cycles TREATMENT RESPONSE: Progressive Disease (PD)  START ON PATHWAY REGIMEN - Pancreatic Adenocarcinoma     A cycle is every 28 days:     Nab-paclitaxel (protein bound)      Gemcitabine   **Always confirm dose/schedule in your pharmacy ordering system**  Patient Characteristics: No Distant Metastases, Locally Advanced, Anatomically Unresectable, Second Line, MSS/pMMR or MSI Unknown, Fluoropyrimidine-Based Therapy  First Line Current evidence of distant metastases<= No AJCC T Category: T2 AJCC N Category: N0 AJCC M Category: M0 AJCC 8 Stage Grouping: IB Line of Therapy: Second Line Microsatellite/Mismatch Repair Status: MSS/pMMR Intent of Therapy: Non-Curative / Palliative Intent, Discussed with Patient

## 2018-10-18 ENCOUNTER — Other Ambulatory Visit: Payer: Self-pay

## 2018-10-18 ENCOUNTER — Telehealth: Payer: Self-pay

## 2018-10-18 ENCOUNTER — Other Ambulatory Visit: Payer: Self-pay | Admitting: Nurse Practitioner

## 2018-10-18 ENCOUNTER — Inpatient Hospital Stay (HOSPITAL_BASED_OUTPATIENT_CLINIC_OR_DEPARTMENT_OTHER): Payer: Medicare Other | Admitting: Nurse Practitioner

## 2018-10-18 ENCOUNTER — Inpatient Hospital Stay: Payer: Medicare Other

## 2018-10-18 ENCOUNTER — Telehealth: Payer: Self-pay | Admitting: Hematology

## 2018-10-18 VITALS — BP 107/71 | HR 115 | Temp 98.3°F | Resp 18 | Ht 63.0 in | Wt 167.1 lb

## 2018-10-18 DIAGNOSIS — E785 Hyperlipidemia, unspecified: Secondary | ICD-10-CM | POA: Diagnosis not present

## 2018-10-18 DIAGNOSIS — I81 Portal vein thrombosis: Secondary | ICD-10-CM | POA: Diagnosis not present

## 2018-10-18 DIAGNOSIS — Z7901 Long term (current) use of anticoagulants: Secondary | ICD-10-CM | POA: Diagnosis not present

## 2018-10-18 DIAGNOSIS — C250 Malignant neoplasm of head of pancreas: Secondary | ICD-10-CM

## 2018-10-18 DIAGNOSIS — I10 Essential (primary) hypertension: Secondary | ICD-10-CM | POA: Diagnosis not present

## 2018-10-18 DIAGNOSIS — C50411 Malignant neoplasm of upper-outer quadrant of right female breast: Secondary | ICD-10-CM | POA: Diagnosis not present

## 2018-10-18 DIAGNOSIS — E119 Type 2 diabetes mellitus without complications: Secondary | ICD-10-CM | POA: Diagnosis not present

## 2018-10-18 DIAGNOSIS — Z923 Personal history of irradiation: Secondary | ICD-10-CM | POA: Diagnosis not present

## 2018-10-18 DIAGNOSIS — Z7982 Long term (current) use of aspirin: Secondary | ICD-10-CM | POA: Diagnosis not present

## 2018-10-18 DIAGNOSIS — Z79899 Other long term (current) drug therapy: Secondary | ICD-10-CM | POA: Diagnosis not present

## 2018-10-18 DIAGNOSIS — R1011 Right upper quadrant pain: Secondary | ICD-10-CM

## 2018-10-18 DIAGNOSIS — G893 Neoplasm related pain (acute) (chronic): Secondary | ICD-10-CM | POA: Diagnosis not present

## 2018-10-18 DIAGNOSIS — K219 Gastro-esophageal reflux disease without esophagitis: Secondary | ICD-10-CM | POA: Diagnosis not present

## 2018-10-18 DIAGNOSIS — K5900 Constipation, unspecified: Secondary | ICD-10-CM | POA: Diagnosis not present

## 2018-10-18 DIAGNOSIS — Z5111 Encounter for antineoplastic chemotherapy: Secondary | ICD-10-CM | POA: Diagnosis not present

## 2018-10-18 DIAGNOSIS — E860 Dehydration: Secondary | ICD-10-CM | POA: Diagnosis not present

## 2018-10-18 DIAGNOSIS — R0602 Shortness of breath: Secondary | ICD-10-CM | POA: Diagnosis not present

## 2018-10-18 DIAGNOSIS — Z79811 Long term (current) use of aromatase inhibitors: Secondary | ICD-10-CM | POA: Diagnosis not present

## 2018-10-18 DIAGNOSIS — Z170 Estrogen receptor positive status [ER+]: Secondary | ICD-10-CM | POA: Diagnosis not present

## 2018-10-18 DIAGNOSIS — E039 Hypothyroidism, unspecified: Secondary | ICD-10-CM | POA: Diagnosis not present

## 2018-10-18 DIAGNOSIS — C787 Secondary malignant neoplasm of liver and intrahepatic bile duct: Secondary | ICD-10-CM | POA: Diagnosis not present

## 2018-10-18 DIAGNOSIS — D649 Anemia, unspecified: Secondary | ICD-10-CM | POA: Diagnosis not present

## 2018-10-18 DIAGNOSIS — D72829 Elevated white blood cell count, unspecified: Secondary | ICD-10-CM | POA: Diagnosis not present

## 2018-10-18 LAB — CMP (CANCER CENTER ONLY)
ALT: 82 U/L — ABNORMAL HIGH (ref 0–44)
AST: 91 U/L — ABNORMAL HIGH (ref 15–41)
Albumin: 2.7 g/dL — ABNORMAL LOW (ref 3.5–5.0)
Alkaline Phosphatase: 225 U/L — ABNORMAL HIGH (ref 38–126)
Anion gap: 14 (ref 5–15)
BUN: 23 mg/dL (ref 8–23)
CO2: 21 mmol/L — ABNORMAL LOW (ref 22–32)
Calcium: 9.4 mg/dL (ref 8.9–10.3)
Chloride: 96 mmol/L — ABNORMAL LOW (ref 98–111)
Creatinine: 0.92 mg/dL (ref 0.44–1.00)
GFR, Est AFR Am: >60 mL/min (ref >60)
GFR, Estimated: >60 mL/min (ref >60)
Glucose, Bld: 129 mg/dL — ABNORMAL HIGH (ref 70–99)
Potassium: 3.9 mmol/L (ref 3.5–5.1)
Sodium: 131 mmol/L — ABNORMAL LOW (ref 135–145)
Total Bilirubin: 1 mg/dL (ref 0.3–1.2)
Total Protein: 6.8 g/dL (ref 6.5–8.1)

## 2018-10-18 LAB — CBC WITH DIFFERENTIAL (CANCER CENTER ONLY)
Abs Immature Granulocytes: 0.08 10*3/uL — ABNORMAL HIGH (ref 0.00–0.07)
Basophils Absolute: 0 10*3/uL (ref 0.0–0.1)
Basophils Relative: 0 %
Eosinophils Absolute: 0 10*3/uL (ref 0.0–0.5)
Eosinophils Relative: 0 %
HCT: 30.4 % — ABNORMAL LOW (ref 36.0–46.0)
Hemoglobin: 10.1 g/dL — ABNORMAL LOW (ref 12.0–15.0)
Immature Granulocytes: 1 %
Lymphocytes Relative: 13 %
Lymphs Abs: 1.5 10*3/uL (ref 0.7–4.0)
MCH: 32.1 pg (ref 26.0–34.0)
MCHC: 33.2 g/dL (ref 30.0–36.0)
MCV: 96.5 fL (ref 80.0–100.0)
Monocytes Absolute: 3 10*3/uL — ABNORMAL HIGH (ref 0.1–1.0)
Monocytes Relative: 24 %
Neutro Abs: 7.5 10*3/uL (ref 1.7–7.7)
Neutrophils Relative %: 62 %
Platelet Count: 331 10*3/uL (ref 150–400)
RBC: 3.15 MIL/uL — ABNORMAL LOW (ref 3.87–5.11)
RDW: 16.3 % — ABNORMAL HIGH (ref 11.5–15.5)
WBC Count: 12.2 10*3/uL — ABNORMAL HIGH (ref 4.0–10.5)
nRBC: 0 % (ref 0.0–0.2)

## 2018-10-18 MED ORDER — OXYCODONE-ACETAMINOPHEN 5-325 MG PO TABS
ORAL_TABLET | ORAL | Status: AC
  Filled 2018-10-18: qty 1

## 2018-10-18 MED ORDER — OXYCODONE-ACETAMINOPHEN 5-325 MG PO TABS: 1.0000 | ORAL_TABLET | Freq: Four times a day (QID) | ORAL | 0 refills | Status: DC | PRN

## 2018-10-18 MED ORDER — OXYCODONE-ACETAMINOPHEN 5-325 MG PO TABS
1.0000 | ORAL_TABLET | Freq: Once | ORAL | Status: AC
  Administered 2018-10-18: 12:00:00 1 via ORAL

## 2018-10-18 NOTE — Progress Notes (Addendum)
Taylor Oconnor   [phone] Fax:([phone]) [phone]   Clinic Follow up Note   Patient Care Team: Unk Pinto, MD as PCP - General (Internal Medicine) Alphonsa Overall, MD as Consulting Physician (General Surgery) Magrinat, Virgie Dad, MD as Consulting Physician (Oncology) Thea Silversmith, MD as Consulting Physician (Radiation Oncology) Sylvan Cheese, Taylor Oconnor as Nurse Practitioner (Hematology and Oncology) Mauri Pole, MD as Consulting Physician (Gastroenterology) Arna Snipe, RN as Oncology Nurse Navigator 10/18/2018  CHIEF COMPLAINT: acute abdominal pain   SUMMARY OF ONCOLOGIC HISTORY: Oncology History  Malignant neoplasm of upper-outer quadrant of right breast in female, estrogen receptor positive ([place])  11/15/2014 Mammogram   Right breast: mass warranting further evaluation   11/27/2014 Breast US   0.4 x 0.4 x 0.6 cm irregular hypoechoic mass within the right breast 10 o'clock position 7 cm from the nipple. Additionally there is a 0.7 x 0.8 x 0.8 cm taller than wide irregular hypoechoic mass with posterior acoustic shadowing within the right breast.   11/28/2014 Initial Biopsy   Right breast core needle biopsies, 2 masses: both invasive ductal carcinoma, grade 1, ER+ (100%), PR+ (85% -larger), HER2/neu negative, Ki67 5-10%.  Smaller mass PR-   11/28/2014 Clinical Stage   Stage IA: T1b N0   12/28/2014 Definitive Surgery   Right lumpectomies/SLNB: invasive ductal carcinoma, grade 2, repeat HER2/neu negative, 0/2 LN   12/28/2014 Pathologic Stage   Stage IA: npT1c pN0   12/28/2014 Oncotype testing   RS 16 (10% ROR)   02/18/2015 - 04/03/2015 Radiation Therapy    Right breast / 45 Gray at 1.8 [place] Cables per fraction x 25 fractions; right breast boost / 16 Gray at [employer] per fraction x 8 fractions   05/13/2015 -  Anti-estrogen oral therapy   Anastrozole 1 mg daily.   05/30/2015 Survivorship   SCP mailed to patient in lieu of in person visit    08/01/2018 Genetic Testing   Negative genetic testing on the common hereditary cancer panel.  The Common Hereditary Gene Panel offered by [facility] includes sequencing and/or deletion duplication testing of the following 48 genes: APC, ATM, AXIN2, BARD1, BMPR1A, BRCA1, BRCA2, BRIP1, CDH1, CDK4, CDKN2A (p14ARF), CDKN2A (p16INK4a), CHEK2, CTNNA1, DICER1, EPCAM (Deletion/duplication testing only), GREM1 (promoter region deletion/duplication testing only), KIT, MEN1, MLH1, MSH2, MSH3, MSH6, MUTYH, NBN, NF1, NHTL1, PALB2, PDGFRA, PMS2, POLD1, POLE, PTEN, RAD50, RAD51C, RAD51D, RNF43, SDHB, SDHC, SDHD, SMAD4, SMARCA4. STK11, TP53, TSC1, TSC2, and VHL.  The following genes were evaluated for sequence changes only: SDHA and HOXB13 c.251G>A variant only. The report date is August 01, 2018.   Pancreatic cancer ([place])  06/30/2018 Imaging   CT AP WO Contrast 06/30/18  IMPRESSION: 1. New common bile duct dilatation and intrahepatic bile duct dilatation. New fullness of the pancreatic head which could represent rapidly developing obstructive neoplastic process or sequela of recent localized pancreatitis. Recommend further characterization with MRCP or ERCP. 2. No other acute or significant findings. No bowel obstruction or evidence of bowel wall inflammation. No free fluid or abscess collection. No free intraperitoneal air. 3. Additional chronic/incidental findings detailed above.   07/01/2018 Procedure   ERCP by Dr Fuller Plan on 07/01/18  IMPRESSION - A single severe segmental biliary stricture was found in the lower third of the common bile duct. The stricture was malignant appearing. Brushed. - The entire biliary tree proximal to the stricture was dilated, secondary to the stricture. - Prior cholecystectomy. - A biliary sphincterotomy was performed. - One plastic stent was placed into the common  bile duct.   07/01/2018 Pathology Results   Diagnosis 07/01/18 BILE DUCT BRUSHING (SPECIMEN 1 OF 1 COLLECTED 07/01/2018)  SUSPICIOUS FOR ADENOCARCINOMA. SEE COMMENT.   07/14/2018 Initial Diagnosis   Pancreatic cancer ([place])   07/14/2018 Procedure   EUS by Dr Ardis Hughs on 07/14/18 IMPRESSION PENDING    07/14/2018 Cancer Staging   Staging form: Exocrine Pancreas, AJCC 8th Edition - Clinical: Stage IB (cT2, cN0, cM0) - Signed by Truitt Merle, MD on 07/15/2018   07/21/2018 PET scan   IMPRESSION: 1. Mass in head of pancreas is intensely hypermetabolic compatible with primary pancreatic adenocarcinoma. 2. Enlarged and FDG avid porta hepatic lymph node compatible with metastatic adenopathy. 3. Extensive FDG avid liver metastases involve both lobes of liver and exhibit marked progression when compared with MRI from 07/01/2018. 4.  Aortic Atherosclerosis (ICD10-I70.0).   07/26/2018 - 10/03/2018 Chemotherapy   FOLFIRINOX q2weeks starting 07/26/18. Last cycle 10/03/18 due to disease progression.    10/14/2018 Imaging   CT AP W Contrast  IMPRESSION: Marked increase in diffuse liver metastases.   New mild portacaval and peripancreatic lymphadenopathy, consistent with metastatic disease.   Mild decrease in size of pancreatic head mass.   Diffuse portal vein thrombosis, new since prior exam. These results will be called to the ordering clinician or representative by the Radiologist Assistant, and communication documented in the PACS or zVision Dashboard.      Chemotherapy   PENDING Second-line Gemcitibine and Abraxane 2 weeks on/1 week off   10/20/2018 -  Chemotherapy   The patient had PACLitaxel-protein bound (ABRAXANE) chemo infusion 225 mg, 125 mg/m2 = 225 mg, Intravenous,  Once, 0 of 4 cycles gemcitabine (GEMZAR) 1,862 mg in sodium chloride 0.9 % 250 mL chemo infusion, 1,000 mg/m2 = 1,862 mg, Intravenous,  Once, 0 of 4 cycles  for chemotherapy treatment.      CURRENT THERAPY: PENDING second line gemcitabine and abraxane    INTERVAL HISTORY: Taylor Oconnor presents for symptom management visit for acute pain that starts  in RUQ under her breast and radiates down right abdomen to her pelvis. Onset 1 day ago after returning home from her scheduled office visit with Dr. Burr Medico where she learned her cancer has progressed. Pain began suddenly while resting on the couch, severe and constant, worse on deep breath and movement. She rates pain 8/10 today. Tramadol and gabapentin "didn't touch it" so she took hydrocodone today which has made her a little lightheaded but did help the pain somewhat. Denies back pain. Urine is slightly darker. Had normal BM 1 day ago. Denies n/v, fever or chills. She is not eating or drinking much. She is short of breath on exertion which is new for her, no cough or chest pain.   MEDICAL HISTORY:  Past Medical History:  Diagnosis Date  . Allergy    SEASONAL  . Anxiety   . At risk for difficult insertion of breathing tube   . Breast cancer of upper-outer quadrant of right female breast ([place]) 12/03/2014  . Common bile duct (CBD) obstruction   . 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 Family history of breast cancer   . GERD (gastroesophageal reflux disease)    hx - resolved with lap band surgery  . H/O hiatal hernia    resolved with band surgery  . H/O laparoscopic adjustable gastric banding 2010  . History of kidney stones 02/2018  . Hyperlipidemia   . Hypertension   . Hypokalemia   . Insomnia    tx with zanax  .  Obesity   . Pancreatic mass   . PCOS (polycystic ovarian syndrome)   . Personal history of radiation therapy   . 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 Type II or unspecified type diabetes mellitus without mention of complication, not stated as uncontrolled 12/14/2012   no meds, diet controlled  . Wears contact lenses   . Wears glasses     SURGICAL HISTORY: Past Surgical History:  Procedure Laterality Date  . BACK SURGERY     cervical  . BILIARY STENT PLACEMENT N/A 07/01/2018   Procedure: BILIARY STENT PLACEMENT;  Surgeon: Ladene Artist, MD;   Location: [facility];  Service: Endoscopy;  Laterality: N/A;  cbd brushing  . BREAST BIOPSY    . BREAST LUMPECTOMY Right 2016  . BREAST LUMPECTOMY WITH NEEDLE LOCALIZATION AND AXILLARY SENTINEL LYMPH NODE BX Right 12/28/2014   Procedure: RIGHT BREAST LUMPECTOMY WITH TWO (2) NEEDLE LOCALIZATION AND AXILLARY SENTINEL LYMPH NODE BX;  Surgeon: Alphonsa Overall, MD;  Location: [facility];  Service: General;  Laterality: Right;  . CESAREAN SECTION     x 1  . CHOLECYSTECTOMY    . COLONOSCOPY  09/29/2004   normal  . CYSTOSCOPY WITH RETROGRADE PYELOGRAM, URETEROSCOPY AND STENT PLACEMENT Right 03/24/2018   Procedure: CYSTOSCOPY WITH RETROGRADE PYELOGRAM, URETEROSCOPY AND STENT PLACEMENT;  Surgeon: Franchot Gallo, MD;  [facility];  Service: Urology;  Laterality: Right;  1 HR  . DIAGNOSTIC LAPAROSCOPY    . ERCP N/A 07/01/2018   Procedure: ENDOSCOPIC RETROGRADE CHOLANGIOPANCREATOGRAPHY (ERCP);  Surgeon: Ladene Artist, MD;  Location: [facility];  Service: Endoscopy;  Laterality: N/A;  . ESOPHAGOGASTRODUODENOSCOPY (EGD) WITH PROPOFOL N/A 07/14/2018   Procedure: ESOPHAGOGASTRODUODENOSCOPY (EGD) WITH PROPOFOL;  Surgeon: Milus Banister, MD;  Location: [facility];  Service: Endoscopy;  Laterality: N/A;  . EUS N/A 07/14/2018   Procedure: UPPER ENDOSCOPIC ULTRASOUND (EUS) RADIAL;  Surgeon: Milus Banister, MD;  Location: [facility];  Service: Endoscopy;  Laterality: N/A;  . FINE NEEDLE ASPIRATION  07/14/2018   Procedure: FINE NEEDLE ASPIRATION (FNA) RADIAL;  Surgeon: Milus Banister, MD;  Location: [facility];  Service: Endoscopy;;  . HERNIA REPAIR  2008  . IR IMAGING GUIDED PORT INSERTION  07/20/2018  . LAPAROSCOPIC APPENDECTOMY N/A 08/06/2012   Procedure: APPENDECTOMY LAPAROSCOPIC;  Surgeon: Earnstine Regal, MD;  Location: [facility];  Service: General;  Laterality: N/A;  . LAPAROSCOPIC GASTRIC BANDING WITH HIATAL HERNIA REPAIR N/A Nov 2008  . SPHINCTEROTOMY  07/01/2018    Procedure: SPHINCTEROTOMY;  Surgeon: Ladene Artist, MD;  Location: [facility];  Service: Endoscopy;;  . TUBAL LIGATION      I have reviewed the social history and family history with the patient and they are unchanged from previous note.  ALLERGIES:  is allergic to oxaliplatin; codeine; lipitor [atorvastatin]; and ace inhibitors.  MEDICATIONS:  Current Outpatient Medications  Medication Sig Dispense Refill  . acetaminophen (TYLENOL) 500 MG tablet Take 1,000 mg by mouth 2 (two) times a day.    . anastrozole (ARIMIDEX) 1 MG tablet TAKE 1 TABLET BY MOUTH EVERY DAY (Patient taking differently: Take 1 mg by mouth daily. ) 90 tablet 3  . aspirin EC 81 MG tablet Take 81 mg by mouth daily.    Marland Kitchen atenolol (TENORMIN) 100 MG tablet TAKE 1 TABLET BY MOUTH EVERY DAY (Patient taking differently: Take 50 mg by mouth daily after supper. ) 90 tablet 3  . Cholecalciferol (VITAMIN D3) 5000 UNITS CAPS Take by mouth  daily after supper.     . fluconazole (DIFLUCAN) 100 MG tablet Take 1 tablet (100 mg total) by mouth daily. 10 tablet 0  . lidocaine (XYLOCAINE) 2 % solution Use as directed 5 mLs in the mouth or throat every 3 (three) hours as needed for mouth pain. 200 mL 1  . magic mouthwash SOLN Take 5 mLs by mouth 4 (four) times daily as needed for mouth pain. 240 mL 1  . nystatin (MYCOSTATIN) 100000 UNIT/ML suspension TAKE 5 ML BY MOUTH THREE TIMES DAILY AS DIRECTED 60 mL 0  . ondansetron (ZOFRAN ODT) 8 MG disintegrating tablet Take 1 tablet (8 mg total) by mouth every 8 (eight) hours as needed for nausea or vomiting. 20 tablet 2  . potassium chloride SA (K-DUR) 20 MEQ tablet Take 1 tablet (20 mEq total) by mouth 2 (two) times daily. 60 tablet 1  . promethazine (PHENERGAN) 25 MG tablet Take 1/2-1 tab every 6 hours as needed for nausea. 60 tablet 3  . Rivaroxaban 15 & 20 MG TBPK Follow package directions: Take one 25m tablet by mouth twice a day. On day 22, switch to one 223mtablet once a day. Take with  food. 51 each 0  . rosuvastatin (CRESTOR) 10 MG tablet Take 1 tablet Daily for Cholesterol 90 tablet 3  . traMADol (ULTRAM) 50 MG tablet Take 1 tablet (50 mg total) by mouth every 6 (six) hours as needed. 60 tablet 0  . traZODone (DESYREL) 150 MG tablet Take 1/2 to 1 tablet 1 to 2 hours before Bedtime (Patient taking differently: Take 150 mg by mouth. ) 90 tablet 3  . verapamil (CALAN) 80 MG tablet Take 1 tablet 2 x /day with meal for BP (Patient taking differently: Take 80 mg by mouth 2 (two) times daily with a meal. ) 180 tablet 3  . Zinc 50 MG TABS Take 50 mg by mouth daily after supper.    . Marland KitchenxyCODONE-acetaminophen (PERCOCET/ROXICET) 5-325 MG tablet Take 1 tablet by mouth every 6 (six) hours as needed for severe pain. 30 tablet 0   No current facility-administered medications for this visit.     PHYSICAL EXAMINATION:  Vitals:   10/18/18 1104 10/18/18 1226  BP: 104/68 107/71  Pulse: (!) 117 (!) 115  Resp:  18  Temp:    SpO2:  96%   Filed Weights   10/18/18 1021  Weight: 167 lb 1.6 oz (75.8 kg)    GENERAL: alert, no distress but uncomfortable SKIN: no rash or significant pallor  EYES: sclera clear LYMPH:  no palpable cervical or supraclavicular lymphadenopathy  LUNGS: clear with normal breathing effort HEART: tachycardic, regular rhythm, no lower extremity edema ABDOMEN: RUQ tenderness. Gastric lap band is palpable as usual. Mild right flank pain on deep palpation. Normal bowel sounds Musculoskeletal:no cyanosis of digits  NEURO: alert & oriented x 3 with fluent speech, needs assistance to exam table.  PAC without erythema    LABORATORY DATA:  I have reviewed the data as listed CBC Latest Ref Rng & Units 10/18/2018 10/14/2018 10/03/2018  WBC 4.0 - 10.5 K/uL 12.2(H) 4.2 4.9  Hemoglobin 12.0 - 15.0 g/dL 10.1(L) 10.3(L) 11.0(L)  Hematocrit 36.0 - 46.0 % 30.4(L) 31.8(L) 33.9(L)  Platelets 150 - 400 K/uL 331 244 213     CMP Latest Ref Rng & Units 10/18/2018 10/14/2018  10/03/2018  Glucose 70 - 99 mg/dL 129(H) 116(H) 136(H)  BUN 8 - 23 mg/dL _0 Creatinine 0.44 - 1.00 mg/dL 0.92 0.72 0.76  Sodium 135 - 145 mmol/L 131(L) 138 140  Potassium 3.5 - 5.1 mmol/L 3.9 4.1 4.1  Chloride 98 - 111 mmol/L 96(L) 100 103  CO2 22 - 32 mmol/L 21(L) 28 27  Calcium 8.9 - 10.3 mg/dL 9.4 9.1 9.2  Total Protein 6.5 - 8.1 g/dL 6.8 6.4(L) 6.4(L)  Total Bilirubin 0.3 - 1.2 mg/dL 1.0 0.4 0.3  Alkaline Phos 38 - 126 U/L 225(H) 163(H) 146(H)  AST 15 - 41 U/L 91(H) 42(H) 30  ALT 0 - 44 U/L 82(H) 23 22      RADIOGRAPHIC STUDIES: I have personally reviewed the radiological images as listed and agreed with the findings in the report. No results found.   ASSESSMENT & PLAN: Taylor Oconnor is a 68 y.o. female with Adenocarcinoma of Pancreatic Head, High grade, cT2NxM1 withdiffuse metastasisto liver   1. Acute severe right abdominal pain  -began acutely 1 day ago in the right abdomen, pain radiates from beneath rib cage to right pelvis. Hydrocodone was not effective. She has mild tachycardia and borderline hypotension, but she appears stable with normal respiratory effort, not hypoxic.  -Labs show mild leukocytosis, which is nonspecific and could be related to recovery from recent chemotherapy; she is afebrile. LFTs have increased slightly, normal bili.  -She is not clinically jaundiced. She does have a biliary stent in place, per CT AP on 10/2 there is no biliary ductal dilatation. There is significant thrombosis in the portal vein, she was prescribed Xarelto per Dr. Burr Medico on 10/5 but has not started yet. I encouraged her to start today.    -She has RUQ and abdominal pain in the area of known enlarging liver metastasis, which is the likely source of her pain. The differential also includes tumor bleeding and capsular irritation vs complication with her biliary stent vs PE but the clinical suspicion for this is lower. -She was treated with percocet 5-325 for pain management in  clinic. Her pain was re-evaluated with improvement to level 4/10. She remained mildly tachy but otherwise stable. I encouraged her to remain hydrated.  -Taylor Oconnor was discharged home in stable condition with a prescription for percocet. I reviewed potential for constipation on opioids and to use stool softener or laxative if needed.  -We reviewed concerning signs to call and report. I recommend if she has worsening dyspnea, severe pain, or fever to report to the ED, she understands -Patient was seen with Dr. Benay Spice today -she will return on 10/9 to begin second line gem/abraxane   All questions were answered. The patient knows to call the clinic with any problems, questions or concerns. No barriers to learning was detected. I spent 30 minutes counseling the patient face to face. The total time spent in the appointment was 60 minutes and more than 50% was on counseling and review of test results     Taylor Feeling, Taylor Oconnor 10/18/18   This was a shared visit with Cira Rue.  Ms. Trinidad was interviewed and examined.  She presents today with acute onset right upper quadrant pain.  We reviewed CT images.  I suspect the pain is related to liver metastases, potentially with inflammation or hemorrhage at the liver capsule.  We have a low clinical suspicion for an infection or acute thrombosis.  Her pain improved after Percocet.  Julieanne Manson, MD

## 2018-10-18 NOTE — Telephone Encounter (Signed)
Taylor Oconnor here today for visit with Taylor Rue NP Taylor Oconnor stated she was having pain from under her breast radiating down to her pelvis Taylor Oconnor. Stated the pain was 8/10 per Lacie percocet 5-325  was given pain re evaluated pain scale 4/10 stated she felt better. Lacie informed V/S stable. Per [place] to leave clinic.

## 2018-10-18 NOTE — Telephone Encounter (Signed)
Patient calls with complaint of severe right side pain, starts under right breast down to pelvis, has kept her up all night.  Instructed patient to come in and be seen by Cira Rue NP at 9:45 today.  She verbalized an understanding.

## 2018-10-18 NOTE — Telephone Encounter (Signed)
Scheduled appt per 10/5 los.  Spoke with patient and patient stated she was on her way to the cancer center to an appt and will get a print out from scheduling.

## 2018-10-19 ENCOUNTER — Inpatient Hospital Stay: Payer: Medicare Other

## 2018-10-19 ENCOUNTER — Telehealth: Payer: Self-pay | Admitting: Nurse Practitioner

## 2018-10-19 NOTE — Telephone Encounter (Signed)
No los per 10/6. °

## 2018-10-21 ENCOUNTER — Other Ambulatory Visit: Payer: Self-pay

## 2018-10-21 ENCOUNTER — Inpatient Hospital Stay: Payer: Medicare Other

## 2018-10-21 VITALS — BP 106/64 | HR 90 | Temp 97.7°F | Resp 18

## 2018-10-21 DIAGNOSIS — I10 Essential (primary) hypertension: Secondary | ICD-10-CM | POA: Diagnosis not present

## 2018-10-21 DIAGNOSIS — C787 Secondary malignant neoplasm of liver and intrahepatic bile duct: Secondary | ICD-10-CM | POA: Diagnosis not present

## 2018-10-21 DIAGNOSIS — E119 Type 2 diabetes mellitus without complications: Secondary | ICD-10-CM | POA: Diagnosis not present

## 2018-10-21 DIAGNOSIS — K219 Gastro-esophageal reflux disease without esophagitis: Secondary | ICD-10-CM | POA: Diagnosis not present

## 2018-10-21 DIAGNOSIS — Z7901 Long term (current) use of anticoagulants: Secondary | ICD-10-CM | POA: Diagnosis not present

## 2018-10-21 DIAGNOSIS — D649 Anemia, unspecified: Secondary | ICD-10-CM | POA: Diagnosis not present

## 2018-10-21 DIAGNOSIS — Z5111 Encounter for antineoplastic chemotherapy: Secondary | ICD-10-CM | POA: Diagnosis not present

## 2018-10-21 DIAGNOSIS — G893 Neoplasm related pain (acute) (chronic): Secondary | ICD-10-CM | POA: Diagnosis not present

## 2018-10-21 DIAGNOSIS — E039 Hypothyroidism, unspecified: Secondary | ICD-10-CM | POA: Diagnosis not present

## 2018-10-21 DIAGNOSIS — C50411 Malignant neoplasm of upper-outer quadrant of right female breast: Secondary | ICD-10-CM | POA: Diagnosis not present

## 2018-10-21 DIAGNOSIS — E785 Hyperlipidemia, unspecified: Secondary | ICD-10-CM | POA: Diagnosis not present

## 2018-10-21 DIAGNOSIS — C250 Malignant neoplasm of head of pancreas: Secondary | ICD-10-CM

## 2018-10-21 DIAGNOSIS — Z170 Estrogen receptor positive status [ER+]: Secondary | ICD-10-CM | POA: Diagnosis not present

## 2018-10-21 DIAGNOSIS — Z79811 Long term (current) use of aromatase inhibitors: Secondary | ICD-10-CM | POA: Diagnosis not present

## 2018-10-21 DIAGNOSIS — K5900 Constipation, unspecified: Secondary | ICD-10-CM | POA: Diagnosis not present

## 2018-10-21 DIAGNOSIS — Z95828 Presence of other vascular implants and grafts: Secondary | ICD-10-CM

## 2018-10-21 DIAGNOSIS — Z923 Personal history of irradiation: Secondary | ICD-10-CM | POA: Diagnosis not present

## 2018-10-21 DIAGNOSIS — E860 Dehydration: Secondary | ICD-10-CM | POA: Diagnosis not present

## 2018-10-21 DIAGNOSIS — Z79899 Other long term (current) drug therapy: Secondary | ICD-10-CM | POA: Diagnosis not present

## 2018-10-21 DIAGNOSIS — I81 Portal vein thrombosis: Secondary | ICD-10-CM | POA: Diagnosis not present

## 2018-10-21 DIAGNOSIS — D72829 Elevated white blood cell count, unspecified: Secondary | ICD-10-CM | POA: Diagnosis not present

## 2018-10-21 DIAGNOSIS — Z7982 Long term (current) use of aspirin: Secondary | ICD-10-CM | POA: Diagnosis not present

## 2018-10-21 DIAGNOSIS — R0602 Shortness of breath: Secondary | ICD-10-CM | POA: Diagnosis not present

## 2018-10-21 LAB — CBC WITH DIFFERENTIAL (CANCER CENTER ONLY)
Abs Immature Granulocytes: 2.53 10*3/uL — ABNORMAL HIGH (ref 0.00–0.07)
Basophils Absolute: 0.1 10*3/uL (ref 0.0–0.1)
Basophils Relative: 0 %
Eosinophils Absolute: 0 10*3/uL (ref 0.0–0.5)
Eosinophils Relative: 0 %
HCT: 25.8 % — ABNORMAL LOW (ref 36.0–46.0)
Hemoglobin: 8.4 g/dL — ABNORMAL LOW (ref 12.0–15.0)
Immature Granulocytes: 15 %
Lymphocytes Relative: 10 %
Lymphs Abs: 1.8 10*3/uL (ref 0.7–4.0)
MCH: 31.6 pg (ref 26.0–34.0)
MCHC: 32.6 g/dL (ref 30.0–36.0)
MCV: 97 fL (ref 80.0–100.0)
Monocytes Absolute: 3.4 10*3/uL — ABNORMAL HIGH (ref 0.1–1.0)
Monocytes Relative: 19 %
Neutro Abs: 9.6 10*3/uL — ABNORMAL HIGH (ref 1.7–7.7)
Neutrophils Relative %: 56 %
Platelet Count: 371 10*3/uL (ref 150–400)
RBC: 2.66 MIL/uL — ABNORMAL LOW (ref 3.87–5.11)
RDW: 17.6 % — ABNORMAL HIGH (ref 11.5–15.5)
WBC Count: 17.3 10*3/uL — ABNORMAL HIGH (ref 4.0–10.5)
nRBC: 0.9 % — ABNORMAL HIGH (ref 0.0–0.2)

## 2018-10-21 LAB — CMP (CANCER CENTER ONLY)
ALT: 70 U/L — ABNORMAL HIGH (ref 0–44)
AST: 110 U/L — ABNORMAL HIGH (ref 15–41)
Albumin: 2.3 g/dL — ABNORMAL LOW (ref 3.5–5.0)
Alkaline Phosphatase: 368 U/L — ABNORMAL HIGH (ref 38–126)
Anion gap: 13 (ref 5–15)
BUN: 27 mg/dL — ABNORMAL HIGH (ref 8–23)
CO2: 25 mmol/L (ref 22–32)
Calcium: 8.9 mg/dL (ref 8.9–10.3)
Chloride: 92 mmol/L — ABNORMAL LOW (ref 98–111)
Creatinine: 0.91 mg/dL (ref 0.44–1.00)
GFR, Est AFR Am: >60 mL/min (ref >60)
GFR, Estimated: >60 mL/min (ref >60)
Glucose, Bld: 125 mg/dL — ABNORMAL HIGH (ref 70–99)
Potassium: 3.6 mmol/L (ref 3.5–5.1)
Sodium: 130 mmol/L — ABNORMAL LOW (ref 135–145)
Total Bilirubin: 1.1 mg/dL (ref 0.3–1.2)
Total Protein: 6.4 g/dL — ABNORMAL LOW (ref 6.5–8.1)

## 2018-10-21 MED ORDER — PACLITAXEL PROTEIN-BOUND CHEMO INJECTION 100 MG
125.0000 mg/m2 | Freq: Once | INTRAVENOUS | Status: AC
  Administered 2018-10-21: 16:00:00 225 mg via INTRAVENOUS
  Filled 2018-10-21: qty 45

## 2018-10-21 MED ORDER — HEPARIN SOD (PORK) LOCK FLUSH 100 UNIT/ML IV SOLN
500.0000 [IU] | Freq: Once | INTRAVENOUS | Status: AC | PRN
  Administered 2018-10-21: 18:00:00 500 [IU]
  Filled 2018-10-21: qty 5

## 2018-10-21 MED ORDER — PROCHLORPERAZINE MALEATE 10 MG PO TABS
ORAL_TABLET | ORAL | Status: AC
  Filled 2018-10-21: qty 1

## 2018-10-21 MED ORDER — SODIUM CHLORIDE 0.9% FLUSH
10.0000 mL | Freq: Once | INTRAVENOUS | Status: AC
  Administered 2018-10-21: 13:00:00 10 mL
  Filled 2018-10-21: qty 10

## 2018-10-21 MED ORDER — PROCHLORPERAZINE MALEATE 10 MG PO TABS
10.0000 mg | ORAL_TABLET | Freq: Once | ORAL | Status: AC
  Administered 2018-10-21: 15:00:00 10 mg via ORAL

## 2018-10-21 MED ORDER — SODIUM CHLORIDE 0.9 % IV SOLN
Freq: Once | INTRAVENOUS | Status: AC
  Administered 2018-10-21: 15:00:00 via INTRAVENOUS
  Filled 2018-10-21: qty 250

## 2018-10-21 MED ORDER — SODIUM CHLORIDE 0.9 % IV SOLN
2000.0000 mg | Freq: Once | INTRAVENOUS | Status: AC
  Administered 2018-10-21: 17:00:00 2000 mg via INTRAVENOUS
  Filled 2018-10-21: qty 52.6

## 2018-10-21 MED ORDER — SODIUM CHLORIDE 0.9% FLUSH
10.0000 mL | INTRAVENOUS | Status: DC | PRN
  Administered 2018-10-21: 18:00:00 10 mL
  Filled 2018-10-21: qty 10

## 2018-10-21 NOTE — Progress Notes (Signed)
Per Dr. Burr Medico, [place] to treat with AST 110.

## 2018-10-21 NOTE — Patient Instructions (Signed)
[facility] Discharge Instructions for Patients Receiving Chemotherapy  Today you received the following chemotherapy agents: Abraxane, Gemzar  To help prevent nausea and vomiting after your treatment, we encourage you to take your nausea medication as directed.   If you develop nausea and vomiting that is not controlled by your nausea medication, call the clinic.   BELOW ARE SYMPTOMS THAT SHOULD BE REPORTED IMMEDIATELY:  *FEVER GREATER THAN 100.5 F  *CHILLS WITH OR WITHOUT FEVER  NAUSEA AND VOMITING THAT IS NOT CONTROLLED WITH YOUR NAUSEA MEDICATION  *UNUSUAL SHORTNESS OF BREATH  *UNUSUAL BRUISING OR BLEEDING  TENDERNESS IN MOUTH AND THROAT WITH OR WITHOUT PRESENCE OF ULCERS  *URINARY PROBLEMS  *BOWEL PROBLEMS  UNUSUAL RASH Items with * indicate a potential emergency and should be followed up as soon as possible.  Feel free to call the clinic should you have any questions or concerns. The clinic phone number is [phone].  Please show the CHEMO ALERT CARD at check-in to the Emergency Department and triage nurse.  Nanoparticle Albumin-Bound Paclitaxel injection What is this medicine? NANOPARTICLE ALBUMIN-BOUND PACLITAXEL (Na no PAHR ti kuhl al BYOO muhn-bound PAK li TAX el) is a chemotherapy drug. It targets fast dividing cells, like cancer cells, and causes these cells to die. This medicine is used to treat advanced breast cancer, lung cancer, and pancreatic cancer. This medicine may be used for other purposes; ask your health care provider or pharmacist if you have questions. COMMON BRAND NAME(S): Abraxane What should I tell my health care provider before I take this medicine? They need to know if you have any of these conditions:  kidney disease  liver disease  low blood counts, like low white cell, platelet, or red cell counts  lung or breathing disease, like asthma  tingling of the fingers or toes, or other nerve disorder  an unusual or  allergic reaction to paclitaxel, albumin, other chemotherapy, other medicines, foods, dyes, or preservatives  pregnant or trying to get pregnant  breast-feeding How should I use this medicine? This drug is given as an infusion into a vein. It is administered in a hospital or clinic by a specially trained health care professional. Talk to your pediatrician regarding the use of this medicine in children. Special care may be needed. Overdosage: If you think you have taken too much of this medicine contact a poison control center or emergency room at once. NOTE: This medicine is only for you. Do not share this medicine with others. What if I miss a dose? It is important not to miss your dose. Call your doctor or health care professional if you are unable to keep an appointment. What may interact with this medicine? This medicine may interact with the following medications:  antiviral medicines for hepatitis, HIV or AIDS  certain antibiotics like erythromycin and clarithromycin  certain medicines for fungal infections like ketoconazole and itraconazole  certain medicines for seizures like carbamazepine, phenobarbital, phenytoin  gemfibrozil  nefazodone  rifampin  St. John's wort This list may not describe all possible interactions. Give your health care provider a list of all the medicines, herbs, non-prescription drugs, or dietary supplements you use. Also tell them if you smoke, drink alcohol, or use illegal drugs. Some items may interact with your medicine. What should I watch for while using this medicine? Your condition will be monitored carefully while you are receiving this medicine. You will need important blood work done while you are taking this medicine. This medicine can cause serious   allergic reactions. If you experience allergic reactions like skin rash, itching or hives, swelling of the face, lips, or tongue, tell your doctor or health care professional right away. In some  cases, you may be given additional medicines to help with side effects. Follow all directions for their use. This drug may make you feel generally unwell. This is not uncommon, as chemotherapy can affect healthy cells as well as cancer cells. Report any side effects. Continue your course of treatment even though you feel ill unless your doctor tells you to stop. Call your doctor or health care professional for advice if you get a fever, chills or sore throat, or other symptoms of a cold or flu. Do not treat yourself. This drug decreases your body's ability to fight infections. Try to avoid being around people who are sick. This medicine may increase your risk to bruise or bleed. Call your doctor or health care professional if you notice any unusual bleeding. Be careful brushing and flossing your teeth or using a toothpick because you may get an infection or bleed more easily. If you have any dental work done, tell your dentist you are receiving this medicine. Avoid taking products that contain aspirin, acetaminophen, ibuprofen, naproxen, or ketoprofen unless instructed by your doctor. These medicines may hide a fever. Do not become pregnant while taking this medicine or for 6 months after stopping it. Women should inform their doctor if they wish to become pregnant or think they might be pregnant. Men should not father a child while taking this medicine or for 3 months after stopping it. There is a potential for serious side effects to an unborn child. Talk to your health care professional or pharmacist for more information. Do not breast-feed an infant while taking this medicine or for 2 weeks after stopping it. This medicine may interfere with the ability to get pregnant or to father a child. You should talk to your doctor or health care professional if you are concerned about your fertility. What side effects may I notice from receiving this medicine? Side effects that you should report to your doctor  or health care professional as soon as possible:  allergic reactions like skin rash, itching or hives, swelling of the face, lips, or tongue  breathing problems  changes in vision  fast, irregular heartbeat  low blood pressure  mouth sores  pain, tingling, numbness in the hands or feet  signs of decreased platelets or bleeding - bruising, pinpoint red spots on the skin, black, tarry stools, blood in the urine  signs of decreased red blood cells - unusually weak or tired, feeling faint or lightheaded, falls  signs of infection - fever or chills, cough, sore throat, pain or difficulty passing urine  signs and symptoms of liver injury like dark yellow or brown urine; general ill feeling or flu-like symptoms; light-colored stools; loss of appetite; nausea; right upper belly pain; unusually weak or tired; yellowing of the eyes or skin  swelling of the ankles, feet, hands  unusually slow heartbeat Side effects that usually do not require medical attention (report to your doctor or health care professional if they continue or are bothersome):  diarrhea  hair loss  loss of appetite  nausea, vomiting  tiredness This list may not describe all possible side effects. Call your doctor for medical advice about side effects. You may report side effects to FDA at 1-800-FDA-1088. Where should I keep my medicine? This drug is given in a hospital or clinic and will   not be stored at home. NOTE: This sheet is a summary. It may not cover all possible information. If you have questions about this medicine, talk to your doctor, pharmacist, or health care provider.  2020 [employer] Standard (2016-09-01 13:03:45)  Gemcitabine injection What is this medicine? GEMCITABINE (jem SYE ta been) is a chemotherapy drug. This medicine is used to treat many types of cancer like breast cancer, lung cancer, pancreatic cancer, and ovarian cancer. This medicine may be used for other purposes; ask your  health care provider or pharmacist if you have questions. COMMON BRAND NAME(S): Gemzar, Infugem What should I tell my health care provider before I take this medicine? They need to know if you have any of these conditions:  blood disorders  infection  kidney disease  liver disease  lung or breathing disease, like asthma  recent or ongoing radiation therapy  an unusual or allergic reaction to gemcitabine, other chemotherapy, other medicines, foods, dyes, or preservatives  pregnant or trying to get pregnant  breast-feeding How should I use this medicine? This drug is given as an infusion into a vein. It is administered in a hospital or clinic by a specially trained health care professional. Talk to your pediatrician regarding the use of this medicine in children. Special care may be needed. Overdosage: If you think you have taken too much of this medicine contact a poison control center or emergency room at once. NOTE: This medicine is only for you. Do not share this medicine with others. What if I miss a dose? It is important not to miss your dose. Call your doctor or health care professional if you are unable to keep an appointment. What may interact with this medicine?  medicines to increase blood counts like filgrastim, pegfilgrastim, sargramostim  some other chemotherapy drugs like cisplatin  vaccines Talk to your doctor or health care professional before taking any of these medicines:  acetaminophen  aspirin  ibuprofen  ketoprofen  naproxen This list may not describe all possible interactions. Give your health care provider a list of all the medicines, herbs, non-prescription drugs, or dietary supplements you use. Also tell them if you smoke, drink alcohol, or use illegal drugs. Some items may interact with your medicine. What should I watch for while using this medicine? Visit your doctor for checks on your progress. This drug may make you feel generally unwell.  This is not uncommon, as chemotherapy can affect healthy cells as well as cancer cells. Report any side effects. Continue your course of treatment even though you feel ill unless your doctor tells you to stop. In some cases, you may be given additional medicines to help with side effects. Follow all directions for their use. Call your doctor or health care professional for advice if you get a fever, chills or sore throat, or other symptoms of a cold or flu. Do not treat yourself. This drug decreases your body's ability to fight infections. Try to avoid being around people who are sick. This medicine may increase your risk to bruise or bleed. Call your doctor or health care professional if you notice any unusual bleeding. Be careful brushing and flossing your teeth or using a toothpick because you may get an infection or bleed more easily. If you have any dental work done, tell your dentist you are receiving this medicine. Avoid taking products that contain aspirin, acetaminophen, ibuprofen, naproxen, or ketoprofen unless instructed by your doctor. These medicines may hide a fever. Do not become pregnant while taking this   medicine or for 6 months after stopping it. Women should inform their doctor if they wish to become pregnant or think they might be pregnant. Men should not father a child while taking this medicine and for 3 months after stopping it. There is a potential for serious side effects to an unborn child. Talk to your health care professional or pharmacist for more information. Do not breast-feed an infant while taking this medicine or for at least 1 week after stopping it. Men should inform their doctors if they wish to father a child. This medicine may lower sperm counts. Talk with your doctor or health care professional if you are concerned about your fertility. What side effects may I notice from receiving this medicine? Side effects that you should report to your doctor or health care  professional as soon as possible:  allergic reactions like skin rash, itching or hives, swelling of the face, lips, or tongue  breathing problems  pain, redness, or irritation at site where injected  signs and symptoms of a dangerous change in heartbeat or heart rhythm like chest pain; dizziness; fast or irregular heartbeat; palpitations; feeling faint or lightheaded, falls; breathing problems  signs of decreased platelets or bleeding - bruising, pinpoint red spots on the skin, black, tarry stools, blood in the urine  signs of decreased red blood cells - unusually weak or tired, feeling faint or lightheaded, falls  signs of infection - fever or chills, cough, sore throat, pain or difficulty passing urine  signs and symptoms of kidney injury like trouble passing urine or change in the amount of urine  signs and symptoms of liver injury like dark yellow or brown urine; general ill feeling or flu-like symptoms; light-colored stools; loss of appetite; nausea; right upper belly pain; unusually weak or tired; yellowing of the eyes or skin  swelling of ankles, feet, hands Side effects that usually do not require medical attention (report to your doctor or health care professional if they continue or are bothersome):  constipation  diarrhea  hair loss  loss of appetite  nausea  rash  vomiting This list may not describe all possible side effects. Call your doctor for medical advice about side effects. You may report side effects to FDA at 1-800-FDA-1088. Where should I keep my medicine? This drug is given in a hospital or clinic and will not be stored at home. NOTE: This sheet is a summary. It may not cover all possible information. If you have questions about this medicine, talk to your doctor, pharmacist, or health care provider.  2020 [employer] Standard (2017-03-24 18:06:11)  

## 2018-10-24 ENCOUNTER — Telehealth: Payer: Self-pay

## 2018-10-24 ENCOUNTER — Inpatient Hospital Stay (HOSPITAL_BASED_OUTPATIENT_CLINIC_OR_DEPARTMENT_OTHER): Payer: Medicare Other | Admitting: Medical

## 2018-10-24 ENCOUNTER — Other Ambulatory Visit: Payer: Self-pay

## 2018-10-24 VITALS — BP 124/78 | HR 84 | Temp 98.0°F | Resp 16 | Ht 63.0 in | Wt 168.6 lb

## 2018-10-24 DIAGNOSIS — Z170 Estrogen receptor positive status [ER+]: Secondary | ICD-10-CM | POA: Diagnosis not present

## 2018-10-24 DIAGNOSIS — C50411 Malignant neoplasm of upper-outer quadrant of right female breast: Secondary | ICD-10-CM

## 2018-10-24 DIAGNOSIS — G893 Neoplasm related pain (acute) (chronic): Secondary | ICD-10-CM

## 2018-10-24 DIAGNOSIS — D649 Anemia, unspecified: Secondary | ICD-10-CM | POA: Diagnosis not present

## 2018-10-24 DIAGNOSIS — Z7901 Long term (current) use of anticoagulants: Secondary | ICD-10-CM | POA: Diagnosis not present

## 2018-10-24 DIAGNOSIS — Z5111 Encounter for antineoplastic chemotherapy: Secondary | ICD-10-CM | POA: Diagnosis not present

## 2018-10-24 DIAGNOSIS — K219 Gastro-esophageal reflux disease without esophagitis: Secondary | ICD-10-CM | POA: Diagnosis not present

## 2018-10-24 DIAGNOSIS — E119 Type 2 diabetes mellitus without complications: Secondary | ICD-10-CM | POA: Diagnosis not present

## 2018-10-24 DIAGNOSIS — Z95828 Presence of other vascular implants and grafts: Secondary | ICD-10-CM

## 2018-10-24 DIAGNOSIS — R0602 Shortness of breath: Secondary | ICD-10-CM | POA: Diagnosis not present

## 2018-10-24 DIAGNOSIS — C250 Malignant neoplasm of head of pancreas: Secondary | ICD-10-CM

## 2018-10-24 DIAGNOSIS — R531 Weakness: Secondary | ICD-10-CM

## 2018-10-24 DIAGNOSIS — C787 Secondary malignant neoplasm of liver and intrahepatic bile duct: Secondary | ICD-10-CM | POA: Diagnosis not present

## 2018-10-24 DIAGNOSIS — I81 Portal vein thrombosis: Secondary | ICD-10-CM | POA: Diagnosis not present

## 2018-10-24 DIAGNOSIS — Z923 Personal history of irradiation: Secondary | ICD-10-CM | POA: Diagnosis not present

## 2018-10-24 DIAGNOSIS — E860 Dehydration: Secondary | ICD-10-CM | POA: Diagnosis not present

## 2018-10-24 DIAGNOSIS — D72829 Elevated white blood cell count, unspecified: Secondary | ICD-10-CM | POA: Diagnosis not present

## 2018-10-24 DIAGNOSIS — Z79899 Other long term (current) drug therapy: Secondary | ICD-10-CM | POA: Diagnosis not present

## 2018-10-24 DIAGNOSIS — Z7982 Long term (current) use of aspirin: Secondary | ICD-10-CM | POA: Diagnosis not present

## 2018-10-24 DIAGNOSIS — E785 Hyperlipidemia, unspecified: Secondary | ICD-10-CM | POA: Diagnosis not present

## 2018-10-24 DIAGNOSIS — Z79811 Long term (current) use of aromatase inhibitors: Secondary | ICD-10-CM | POA: Diagnosis not present

## 2018-10-24 DIAGNOSIS — I10 Essential (primary) hypertension: Secondary | ICD-10-CM | POA: Diagnosis not present

## 2018-10-24 DIAGNOSIS — E039 Hypothyroidism, unspecified: Secondary | ICD-10-CM | POA: Diagnosis not present

## 2018-10-24 DIAGNOSIS — K5900 Constipation, unspecified: Secondary | ICD-10-CM | POA: Diagnosis not present

## 2018-10-24 LAB — CMP (CANCER CENTER ONLY)
ALT: 47 U/L — ABNORMAL HIGH (ref 0–44)
AST: 115 U/L — ABNORMAL HIGH (ref 15–41)
Albumin: 2.2 g/dL — ABNORMAL LOW (ref 3.5–5.0)
Alkaline Phosphatase: 364 U/L — ABNORMAL HIGH (ref 38–126)
Anion gap: 9 (ref 5–15)
BUN: 30 mg/dL — ABNORMAL HIGH (ref 8–23)
CO2: 27 mmol/L (ref 22–32)
Calcium: 8.4 mg/dL — ABNORMAL LOW (ref 8.9–10.3)
Chloride: 96 mmol/L — ABNORMAL LOW (ref 98–111)
Creatinine: 0.74 mg/dL (ref 0.44–1.00)
GFR, Est AFR Am: >60 mL/min (ref >60)
GFR, Estimated: >60 mL/min (ref >60)
Glucose, Bld: 165 mg/dL — ABNORMAL HIGH (ref 70–99)
Potassium: 4.4 mmol/L (ref 3.5–5.1)
Sodium: 132 mmol/L — ABNORMAL LOW (ref 135–145)
Total Bilirubin: 1 mg/dL (ref 0.3–1.2)
Total Protein: 5.8 g/dL — ABNORMAL LOW (ref 6.5–8.1)

## 2018-10-24 LAB — CBC WITH DIFFERENTIAL (CANCER CENTER ONLY)
Abs Immature Granulocytes: 0.21 10*3/uL — ABNORMAL HIGH (ref 0.00–0.07)
Basophils Absolute: 0.1 10*3/uL (ref 0.0–0.1)
Basophils Relative: 0 %
Eosinophils Absolute: 0 10*3/uL (ref 0.0–0.5)
Eosinophils Relative: 0 %
HCT: 23.8 % — ABNORMAL LOW (ref 36.0–46.0)
Hemoglobin: 7.9 g/dL — ABNORMAL LOW (ref 12.0–15.0)
Immature Granulocytes: 1 %
Lymphocytes Relative: 2 %
Lymphs Abs: 0.4 10*3/uL — ABNORMAL LOW (ref 0.7–4.0)
MCH: 31.7 pg (ref 26.0–34.0)
MCHC: 33.2 g/dL (ref 30.0–36.0)
MCV: 95.6 fL (ref 80.0–100.0)
Monocytes Absolute: 0.1 10*3/uL (ref 0.1–1.0)
Monocytes Relative: 1 %
Neutro Abs: 18 10*3/uL — ABNORMAL HIGH (ref 1.7–7.7)
Neutrophils Relative %: 96 %
Platelet Count: 265 10*3/uL (ref 150–400)
RBC: 2.49 MIL/uL — ABNORMAL LOW (ref 3.87–5.11)
RDW: 17.9 % — ABNORMAL HIGH (ref 11.5–15.5)
WBC Count: 18.7 10*3/uL — ABNORMAL HIGH (ref 4.0–10.5)
nRBC: 0.2 % (ref 0.0–0.2)

## 2018-10-24 LAB — SAMPLE TO BLOOD BANK

## 2018-10-24 LAB — ABO/RH: ABO/RH(D): A POS

## 2018-10-24 LAB — PREPARE RBC (CROSSMATCH)

## 2018-10-24 MED ORDER — MORPHINE SULFATE (PF) 4 MG/ML IV SOLN
INTRAVENOUS | Status: AC
  Filled 2018-10-24: qty 1

## 2018-10-24 MED ORDER — ACETAMINOPHEN 325 MG PO TABS
ORAL_TABLET | ORAL | Status: AC
  Filled 2018-10-24: qty 2

## 2018-10-24 MED ORDER — SODIUM CHLORIDE 0.9% IV SOLUTION
250.0000 mL | Freq: Once | INTRAVENOUS | Status: AC
  Administered 2018-10-24: 250 mL via INTRAVENOUS
  Filled 2018-10-24: qty 250

## 2018-10-24 MED ORDER — DIPHENHYDRAMINE HCL 25 MG PO CAPS
25.0000 mg | ORAL_CAPSULE | Freq: Once | ORAL | Status: AC
  Administered 2018-10-24: 25 mg via ORAL

## 2018-10-24 MED ORDER — SODIUM CHLORIDE 0.9 % IV SOLN
Freq: Once | INTRAVENOUS | Status: AC
  Administered 2018-10-24: 11:00:00 via INTRAVENOUS
  Filled 2018-10-24: qty 250

## 2018-10-24 MED ORDER — OXYCODONE HCL 5 MG PO TABS: ORAL_TABLET | ORAL | 0 refills | Status: DC

## 2018-10-24 MED ORDER — DIPHENHYDRAMINE HCL 25 MG PO CAPS
ORAL_CAPSULE | ORAL | Status: AC
  Filled 2018-10-24: qty 1

## 2018-10-24 MED ORDER — SODIUM CHLORIDE 0.9% FLUSH
10.0000 mL | Freq: Once | INTRAVENOUS | Status: AC
  Administered 2018-10-24: 10 mL
  Filled 2018-10-24: qty 10

## 2018-10-24 MED ORDER — HEPARIN SOD (PORK) LOCK FLUSH 100 UNIT/ML IV SOLN
500.0000 [IU] | Freq: Every day | INTRAVENOUS | Status: AC | PRN
  Administered 2018-10-24: 500 [IU]
  Filled 2018-10-24: qty 5

## 2018-10-24 MED ORDER — MORPHINE SULFATE 4 MG/ML IJ SOLN
2.0000 mg | Freq: Once | INTRAMUSCULAR | Status: AC
  Administered 2018-10-24: 2 mg via INTRAVENOUS
  Filled 2018-10-24: qty 1

## 2018-10-24 MED ORDER — ACETAMINOPHEN 325 MG PO TABS
650.0000 mg | ORAL_TABLET | Freq: Once | ORAL | Status: AC
  Administered 2018-10-24: 650 mg via ORAL

## 2018-10-24 NOTE — Progress Notes (Signed)
[place]   [phone] [phone]   Clinic Follow up Note   Patient Care Team: Unk Pinto, MD as PCP - General (Internal Medicine) Alphonsa Overall, MD as Consulting Physician (General Surgery) Magrinat, Virgie Dad, MD as Consulting Physician (Oncology) Thea Silversmith, MD as Consulting Physician (Radiation Oncology) Sylvan Cheese, NP as Nurse Practitioner (Hematology and Oncology) Mauri Pole, MD as Consulting Physician (Gastroenterology) Arna Snipe, RN as Oncology Nurse Navigator  Date of Service:  10/28/2018  CHIEF COMPLAINT: F/u ofAdenocarcinoma of Pancreatic Head  SUMMARY OF ONCOLOGIC HISTORY: Oncology History  Malignant neoplasm of upper-outer quadrant of right breast in female, estrogen receptor positive ([place])  11/15/2014 Mammogram   Right breast: mass warranting further evaluation   11/27/2014 Breast US   0.4 x 0.4 x 0.6 cm irregular hypoechoic mass within the right breast 10 o'clock position 7 cm from the nipple. Additionally there is a 0.7 x 0.8 x 0.8 cm taller than wide irregular hypoechoic mass with posterior acoustic shadowing within the right breast.   11/28/2014 Initial Biopsy   Right breast core needle biopsies, 2 masses: both invasive ductal carcinoma, grade 1, ER+ (100%), PR+ (85% -larger), HER2/neu negative, Ki67 5-10%.  Smaller mass PR-   11/28/2014 Clinical Stage   Stage IA: T1b N0   12/28/2014 Definitive Surgery   Right lumpectomies/SLNB: invasive ductal carcinoma, grade 2, repeat HER2/neu negative, 0/2 LN   12/28/2014 Pathologic Stage   Stage IA: npT1c pN0   12/28/2014 Oncotype testing   RS 16 (10% ROR)   02/18/2015 - 04/03/2015 Radiation Therapy    Right breast / 45 Gray at 1.8 [place] Cables per fraction x 25 fractions; right breast boost / 16 Gray at [employer] per fraction x 8 fractions   05/13/2015 -  Anti-estrogen oral therapy   Anastrozole 1 mg daily.   05/30/2015 Survivorship   SCP mailed to  patient in lieu of in person visit   08/01/2018 Genetic Testing   Negative genetic testing on the common hereditary cancer panel.  The Common Hereditary Gene Panel offered by [facility] includes sequencing and/or deletion duplication testing of the following 48 genes: APC, ATM, AXIN2, BARD1, BMPR1A, BRCA1, BRCA2, BRIP1, CDH1, CDK4, CDKN2A (p14ARF), CDKN2A (p16INK4a), CHEK2, CTNNA1, DICER1, EPCAM (Deletion/duplication testing only), GREM1 (promoter region deletion/duplication testing only), KIT, MEN1, MLH1, MSH2, MSH3, MSH6, MUTYH, NBN, NF1, NHTL1, PALB2, PDGFRA, PMS2, POLD1, POLE, PTEN, RAD50, RAD51C, RAD51D, RNF43, SDHB, SDHC, SDHD, SMAD4, SMARCA4. STK11, TP53, TSC1, TSC2, and VHL.  The following genes were evaluated for sequence changes only: SDHA and HOXB13 c.251G>A variant only. The report date is August 01, 2018.   Pancreatic cancer ([place])  06/30/2018 Imaging   CT AP WO Contrast 06/30/18  IMPRESSION: 1. New common bile duct dilatation and intrahepatic bile duct dilatation. New fullness of the pancreatic head which could represent rapidly developing obstructive neoplastic process or sequela of recent localized pancreatitis. Recommend further characterization with MRCP or ERCP. 2. No other acute or significant findings. No bowel obstruction or evidence of bowel wall inflammation. No free fluid or abscess collection. No free intraperitoneal air. 3. Additional chronic/incidental findings detailed above.   07/01/2018 Procedure   ERCP by Dr Fuller Plan on 07/01/18  IMPRESSION - A single severe segmental biliary stricture was found in the lower third of the common bile duct. The stricture was malignant appearing. Brushed. - The entire biliary tree proximal to the stricture was dilated, secondary to the stricture. - Prior cholecystectomy. - A biliary sphincterotomy was performed. - One plastic  stent was placed into the common bile duct.   07/01/2018 Pathology Results   Diagnosis 07/01/18 BILE DUCT BRUSHING  (SPECIMEN 1 OF 1 COLLECTED 07/01/2018) SUSPICIOUS FOR ADENOCARCINOMA. SEE COMMENT.   07/14/2018 Initial Diagnosis   Pancreatic cancer ([place])   07/14/2018 Procedure   EUS by Dr Ardis Hughs on 07/14/18 IMPRESSION PENDING    07/14/2018 Cancer Staging   Staging form: Exocrine Pancreas, AJCC 8th Edition - Clinical: Stage IB (cT2, cN0, cM0) - Signed by Truitt Merle, MD on 07/15/2018   07/21/2018 PET scan   IMPRESSION: 1. Mass in head of pancreas is intensely hypermetabolic compatible with primary pancreatic adenocarcinoma. 2. Enlarged and FDG avid porta hepatic lymph node compatible with metastatic adenopathy. 3. Extensive FDG avid liver metastases involve both lobes of liver and exhibit marked progression when compared with MRI from 07/01/2018. 4.  Aortic Atherosclerosis (ICD10-I70.0).   07/26/2018 - 10/03/2018 Chemotherapy   FOLFIRINOX q2weeks starting 07/26/18. Last cycle 10/03/18 due to disease progression.    10/14/2018 Imaging   CT AP W Contrast  IMPRESSION: Marked increase in diffuse liver metastases.   New mild portacaval and peripancreatic lymphadenopathy, consistent with metastatic disease.   Mild decrease in size of pancreatic head mass.   Diffuse portal vein thrombosis, new since prior exam. These results will be called to the ordering clinician or representative by the Radiologist Assistant, and communication documented in the PACS or zVision Dashboard.     10/21/2018 -  Chemotherapy   Second-line Gemcitibine and Abraxane 2 weeks on/1 week off starting 10/21/18       CURRENT THERAPY:  Second-line Gemcitabine and Abraxane 2 weeks on/1 week off starting 10/21/18.   INTERVAL HISTORY:  Taylor Oconnor is here for a follow up and treatment. She presents to the clinic with her husband. She notes her pain is practically gone after starting to take Oxycodone. She started chemo last week. She notes the following day she started having mouth sores and inflammation. It is so sore she cannot eat  well. She notes her tongue split this morning. She notes she has been very constipated lately but was able to have a bowel movement yesterday. She has only been taking oxycodone once at night. For constipation she has been taking Senakot twice a day. She notes she drinks 1 protein drink a day. She feels she is fatigued from lack of food intake. She had a couple of episodes of epistaxis.     REVIEW OF SYSTEMS:   Constitutional: Denies fevers, chills or abnormal weight loss (+) eating less (+) Fatigue  Eyes: Denies blurriness of vision Ears, nose, mouth, throat, and face: (+) Mouth sores (+) split tongue (+) Dysphagia  Respiratory: Denies cough, dyspnea or wheezes Cardiovascular: Denies palpitation, chest discomfort or lower extremity swelling Gastrointestinal:  Denies nausea, heartburn (+) Constipation  Skin: Denies abnormal skin rashes Lymphatics: Denies new lymphadenopathy or easy bruising Neurological:Denies numbness, tingling or new weaknesses Behavioral/Psych: Mood is stable, no new changes  All other systems were reviewed with the patient and are negative.  MEDICAL HISTORY:  Past Medical History:  Diagnosis Date   Allergy    SEASONAL   Anxiety    At risk for difficult insertion of breathing tube    Breast cancer of upper-outer quadrant of right female breast ([place]) 12/03/2014   Common bile duct (CBD) obstruction    Difficult intubation pt has large tonsils,   Family history of breast cancer    GERD (gastroesophageal reflux disease)    hx - resolved with lap  band surgery   H/O hiatal hernia    resolved with band surgery   H/O laparoscopic adjustable gastric banding 2010   History of kidney stones 02/2018   Hyperlipidemia    Hypertension    Hypokalemia    Insomnia    tx with zanax   Obesity    Pancreatic mass    PCOS (polycystic ovarian syndrome)    Personal history of radiation therapy    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) 12/14/2012   Type II or  unspecified type diabetes mellitus without mention of complication, not stated as uncontrolled 12/14/2012   no meds, diet controlled   Wears contact lenses    Wears glasses     SURGICAL HISTORY: Past Surgical History:  Procedure Laterality Date   BACK SURGERY     cervical   BILIARY STENT PLACEMENT N/A 07/01/2018   Procedure: BILIARY STENT PLACEMENT;  Surgeon: Ladene Artist, MD;  Location: [facility];  Service: Endoscopy;  Laterality: N/A;  cbd brushing   BREAST BIOPSY     BREAST LUMPECTOMY Right 2016   BREAST LUMPECTOMY WITH NEEDLE LOCALIZATION AND AXILLARY SENTINEL LYMPH NODE BX Right 12/28/2014   Procedure: RIGHT BREAST LUMPECTOMY WITH TWO (2) NEEDLE LOCALIZATION AND AXILLARY SENTINEL LYMPH NODE BX;  Surgeon: Alphonsa Overall, MD;  Location: [facility];  Service: General;  Laterality: Right;   CESAREAN SECTION     x 1   CHOLECYSTECTOMY     COLONOSCOPY  09/29/2004   normal   CYSTOSCOPY WITH RETROGRADE PYELOGRAM, URETEROSCOPY AND STENT PLACEMENT Right 03/24/2018   Procedure: CYSTOSCOPY WITH RETROGRADE PYELOGRAM, URETEROSCOPY AND STENT PLACEMENT;  Surgeon: Franchot Gallo, MD;  Location: [facility];  Service: Urology;  Laterality: Right;  1 HR   DIAGNOSTIC LAPAROSCOPY     ERCP N/A 07/01/2018   Procedure: ENDOSCOPIC RETROGRADE CHOLANGIOPANCREATOGRAPHY (ERCP);  Surgeon: Ladene Artist, MD;  Location: [facility];  Service: Endoscopy;  Laterality: N/A;   ESOPHAGOGASTRODUODENOSCOPY (EGD) WITH PROPOFOL N/A 07/14/2018   Procedure: ESOPHAGOGASTRODUODENOSCOPY (EGD) WITH PROPOFOL;  Surgeon: Milus Banister, MD;  Location: [facility];  Service: Endoscopy;  Laterality: N/A;   EUS N/A 07/14/2018   Procedure: UPPER ENDOSCOPIC ULTRASOUND (EUS) RADIAL;  Surgeon: Milus Banister, MD;  Location: [facility];  Service: Endoscopy;  Laterality: N/A;   FINE NEEDLE ASPIRATION  07/14/2018   Procedure: FINE NEEDLE ASPIRATION (FNA) RADIAL;  Surgeon: Milus Banister, MD;  Location: [facility];  Service: Endoscopy;;   HERNIA REPAIR  2008   IR IMAGING GUIDED PORT INSERTION  07/20/2018   LAPAROSCOPIC APPENDECTOMY N/A 08/06/2012   Procedure: APPENDECTOMY LAPAROSCOPIC;  Surgeon: Earnstine Regal, MD;  Location: [facility];  Service: General;  Laterality: N/A;   LAPAROSCOPIC GASTRIC BANDING WITH HIATAL HERNIA REPAIR N/A Nov 2008   SPHINCTEROTOMY  07/01/2018   Procedure: SPHINCTEROTOMY;  Surgeon: Ladene Artist, MD;  Location: [facility];  Service: Endoscopy;;   TUBAL LIGATION      I have reviewed the social history and family history with the patient and they are unchanged from previous note.  ALLERGIES:  is allergic to oxaliplatin; codeine; lipitor [atorvastatin]; and ace inhibitors.  MEDICATIONS:  Current Outpatient Medications  Medication Sig Dispense Refill   anastrozole (ARIMIDEX) 1 MG tablet TAKE 1 TABLET BY MOUTH EVERY DAY (Patient taking differently: Take 1 mg by mouth daily. ) 90 tablet 3   atenolol (TENORMIN) 100 MG tablet TAKE 1 TABLET BY MOUTH EVERY DAY (Patient taking differently: Take 50 mg by mouth  daily after supper. ) 90 tablet 3   lidocaine (XYLOCAINE) 2 % solution Use as directed 5 mLs in the mouth or throat every 3 (three) hours as needed for mouth pain. 200 mL 1   magic mouthwash SOLN Take 5 mLs by mouth 4 (four) times daily as needed for mouth pain. 240 mL 1   nystatin (MYCOSTATIN) 100000 UNIT/ML suspension TAKE 5 ML BY MOUTH THREE TIMES DAILY AS DIRECTED 60 mL 0   ondansetron (ZOFRAN ODT) 8 MG disintegrating tablet Take 1 tablet (8 mg total) by mouth every 8 (eight) hours as needed for nausea or vomiting. 20 tablet 2   oxyCODONE (OXY IR/ROXICODONE) 5 MG immediate release tablet 1 to 2 PO q 4 hours prn pain 60 tablet 0   promethazine (PHENERGAN) 25 MG tablet Take 1/2-1 tab every 6 hours as needed for nausea. 60 tablet 3   Rivaroxaban 15 & 20 MG TBPK Follow package directions: Take one 48m tablet by mouth twice a day.  On day 22, switch to one 232mtablet once a day. Take with food. 51 each 0   rosuvastatin (CRESTOR) 10 MG tablet Take 1 tablet Daily for Cholesterol 90 tablet 3   traMADol (ULTRAM) 50 MG tablet Take 1 tablet (50 mg total) by mouth every 6 (six) hours as needed. 60 tablet 0   traZODone (DESYREL) 150 MG tablet Take 1/2 to 1 tablet 1 to 2 hours before Bedtime (Patient taking differently: Take 150 mg by mouth. ) 90 tablet 3   verapamil (CALAN) 80 MG tablet Take 1 tablet 2 x /day with meal for BP (Patient taking differently: Take 80 mg by mouth 2 (two) times daily with a meal. ) 180 tablet 3   Current Facility-Administered Medications  Medication Dose Route Frequency Provider Last Rate Last Dose   0.9 %  sodium chloride infusion   Intravenous Continuous FeTruitt MerleMD       Facility-Administered Medications Ordered in Other Visits  Medication Dose Route Frequency Provider Last Rate Last Dose   0.9 %  sodium chloride infusion   Intravenous Continuous FeTruitt MerleMD 500 mL/hr at 10/28/18 1430      PHYSICAL EXAMINATION: ECOG PERFORMANCE STATUS: 3 - Symptomatic, >50% confined to bed  Vitals:   10/28/18 1308  BP: 110/67  Pulse: (!) 105  Resp: 18  Temp: 98.7 F (37.1 C)  SpO2: 97%   Filed Weights   10/28/18 1308  Weight: 170 lb 8 oz (77.3 kg)    GENERAL:alert, no distress and comfortable SKIN: skin color, texture, turgor are normal, no rashes or significant lesions (+) Dry skin with peeling of abdomen  EYES: normal, Conjunctiva are pink and non-injected, sclera clear OROPHARYNX:no exudate, no erythema and lips, buccal mucosa, and tongue normal (+) 2 mouth sores under tongue NECK: supple, thyroid normal size, non-tender, without nodularity LYMPH:  no palpable lymphadenopathy in the cervical, axillary LUNGS: clear to auscultation and percussion with normal breathing effort HEART: regular rate & rhythm and no murmurs and no lower extremity edema ABDOMEN:abdomen soft, non-tender and  normal bowel sounds Musculoskeletal:no cyanosis of digits and no clubbing  NEURO: alert & oriented x 3 with fluent speech, no focal motor/sensory deficits  LABORATORY DATA:  I have reviewed the data as listed CBC Latest Ref Rng & Units 10/28/2018 10/24/2018 10/21/2018  WBC 4.0 - 10.5 K/uL 6.9 18.7(H) 17.3(H)  Hemoglobin 12.0 - 15.0 g/dL 8.9(L) 7.9(L) 8.4(L)  Hematocrit 36.0 - 46.0 % 27.3(L) 23.8(L) 25.8(L)  Platelets 150 - 400 K/uL  132(L) 265 371     CMP Latest Ref Rng & Units 10/28/2018 10/24/2018 10/21/2018  Glucose 70 - 99 mg/dL 136(H) 165(H) 125(H)  BUN 8 - 23 mg/dL 18 30(H) 27(H)  Creatinine 0.44 - 1.00 mg/dL 0.70 0.74 0.91  Sodium 135 - 145 mmol/L 136 132(L) 130(L)  Potassium 3.5 - 5.1 mmol/L 4.2 4.4 3.6  Chloride 98 - 111 mmol/L 99 96(L) 92(L)  CO2 22 - 32 mmol/L 27 27 25   Calcium 8.9 - 10.3 mg/dL 8.5(L) 8.4(L) 8.9  Total Protein 6.5 - 8.1 g/dL 5.5(L) 5.8(L) 6.4(L)  Total Bilirubin 0.3 - 1.2 mg/dL 0.9 1.0 1.1  Alkaline Phos 38 - 126 U/L 425(H) 364(H) 368(H)  AST 15 - 41 U/L 54(H) 115(H) 110(H)  ALT 0 - 44 U/L 31 47(H) 70(H)      RADIOGRAPHIC STUDIES: I have personally reviewed the radiological images as listed and agreed with the findings in the report. No results found.   ASSESSMENT & PLAN:  Taylor Oconnor is a 68 y.o. female with   1. Adenocarcinoma of Pancreatic Head, High grade, cT2NxM1 withdiffuse metastasisto liver  -She was newly diagnosed in 06/2018.She was found to havea 2.4cmmass in head of pancreas, initial ERCP with stent placement,EUSand FNA biopsy of her pancreatic massfrom 7/2/20confirmedhigh grade adenocarcinoma.Herpancreatic mass on the MRI and the EUS was borderline resectable, due to the invasion of portal vein. -She unfortunately developed diffuse liver metastasis in a months after her initial diagnosis -I started her onintensivechemoFOLFIRINOX q2weekson 07/26/18.Oxaliplatin heldafter C2due to severe infusion reactionand poor  tolerance overall,she tolerated subsequent FOLFIRI very well.  -S/p 6 cycles her CT AP from 10/14/18 overall shows disease progression compared to her last PET scan.  -I recommended we change her treatment to second line chemo with gemcitabine and Abraxane 2 weeks on /1 week off. She started on 10/21/18.  -She tolerated first week of chemo poorly with fatigue, constipation and moderate to significant mouth sores and less eating. I will refill her magic mouthwash and I recommend she increase Senakot and nutritional supplement.  -Labs reviewed, CBC and CMP WNL except Hg 8.9, plt 132K, BG 136, Ca 8.5, protein 5.5, albumin 2.4, AST 54. Alk phos 425. Will hold chemo today given her poor nutrition due to mouth sores and her fatigue to give her more time to recover. Will give IV Fluids today.  -We discussed goal of care. I discussed as her cancer progresses she will become more symptomatic and chemo will be difficult for her. If this becomes intolerable I have low threshold to stop chemo. Will continue to adjust dose as needed for now.  -will give her a chemo break, re-evaluate her the week of 10/26, if she recovers well, will proceed with single agent gemcitabine  -I had a frank discussion with patient and her husband poor prognosis, and the overall goal of care is palliative, to prolong her life, and preserve her quality of life.  Patient voiced good understanding, she is open to hospice, if she is not able to tolerate any chemo.  At this point, she would like to try more chemo. -F/u in 2 weeks   2.EpigastricAbdominal pain, secondary to #1, Constipation   -Improved since stent placed on 07/01/18 -She lately has developed severe right upper quadrant pain, probably related to the progression in liver.  She is on oxycodone -Pain now near resolved since starting Oxycodone and chemo. I instructed her to reduce Oxycodone to half tablet only as needed.  -She has been constipated more lately  with start of  oxycodone. She has been taking Senakot twice a day. I encouraged her to increase Senakot to twice in the AM and 1-2 times in the afternoon and again at night as needed. I encouraged her to watch for bowel obstruction.   3.Obstructive jaundice,s/p stent placement on 07/01/18  -LFTs have improved after stent placement. Her jaundice has resolved.  -Will monitor   4. Low appetite, Weight loss, Nausea, Mouth Sores  -Years ago she had gastric band placed. She is interested in having it removed.  -She has lost 32 pounds over 5 months secondary to her recent cancer.  -Her weight now mostly stable, with slow down trend.  -She takes Zofran and Phenergan for her nausea, continue. I also previously called in sublingual Zofran.  -S/p week 1 she has not had nausea but immediately developed mouth sores. This has lead to pain with swallowing, eating less and fatigue.  -I will refill Magic mouth wash today (10/28/18). I encouraged her to drink about 5-6 cups a day and increase Protein shakes at least 2-3 times a day. I discussed she should avoid spicy and acidic foods.  -Will set up dietician consult.   5. H/o of right breast cancer, RS 16, ER/PR +, HER2- -Managed by Med Onc Dr [name]  -Diagnosed in 11/2014. S/p right lumpectomy and adjuvant radiation.  -She has been on Anastrozole since 05/2015. She is tolerating well. Will continue   6. Genetic testingwas negative for pathogenetic mutations  7. HTN, DM -DM is mostly controlled, she is off medication -On Verapamil and atenolol -Her Kidney function tests have been normal lately.  8. Liver thrombosis  -Seen on 10/14/18 CT, new from previous scan. This is secondary to her cancer.  -I discussed this can cause Portal hypertension, ascites.  -I recommend blood thinner options to reduce her blood clots and lower risk of complications. I recommend daily Xarelto. She will take 32m loading dose for 3 weeks then proceed with daily 262m  -She had 2  episodes of mild epistaxis recently. She denies any other bleeding. If this occurs again I encouraged her to pinch the bridge of nose to help stop the bleeding. Will monitor.   9.Goal of care discussion, DNR, DNI  -We again discussed the incurable nature of her cancer, and the overall poor prognosis, especially if she does not have good response to chemotherapy or progress on chemo -The patient understands the goal of care is palliative. -I recommend DNR/DNI, she agreed with DNR/DNI today, and I signed her DNR order    PLAN: -I refilled Magic Mouthwash today  -Will hold chemo today given mouth sores, fatigue and poor nutrition this week.  -Lab, flush, f/u and Gemcitabine on any day from 10/26-10/28 -Send palliative care referral  -Set up dietician consult   No problem-specific Assessment & Plan notes found for this encounter.   Orders Placed This Encounter  Procedures   Amb Referral to Palliative Care    Referral Priority:   Routine    Referral Type:   Consultation    Referral Reason:   Symptom Managment    Number of Visits Requested:   1   All questions were answered. The patient knows to call the clinic with any problems, questions or concerns. No barriers to learning was detected. I spent 30 minutes counseling the patient face to face. The total time spent in the appointment was 40 minutes and more than 50% was on counseling and review of test results  Truitt Merle, MD 10/28/2018   I, Joslyn Devon, am acting as scribe for Truitt Merle, MD.   I have reviewed the above documentation for accuracy and completeness, and I agree with the above.

## 2018-10-24 NOTE — Telephone Encounter (Signed)
Patient's husband calls stating that she has not been able to eat all weekend, taking in some liquids, severe pain 10/10 in abdomen, denies fever.  Spoke with Dr. Burr Medico we will bring patient into [facility] to be accessed by Sandi Mealy PA and receive IVF and pain medication.  Spoke with her husband he verbalized an understanding and will proceed to [facility].

## 2018-10-24 NOTE — Progress Notes (Signed)
[facility] Progress Note   Taylor Oconnor [id number] [birth year] 67 y.o.  Taylor Oconnor is managed by Dr. Truitt Merle  Actively treated with chemotherapy/immunotherapy/hormonal therapy: yes  Current therapy: Gemcitibine and Abraxane   Last treated: 10/21/2018 (cycle #1)  Next scheduled appointment with provider: 10/28/2018  Assessment: Plan:    Malignant neoplasm of head of pancreas (Taylor Oconnor) - Plan: CBC with Differential ([place] Only), CMP ([place] only), Sample to Blood Bank, sodium chloride flush (NS) 0.9 % injection 10 mL  Shortness of breath - Plan: CBC with Differential ([place]), CMP ([place] only), Sample to Blood Bank  Weakness - Plan: CBC with Differential ([place]), CMP ([place] only), Sample to Blood Bank  Neoplasm related pain - Plan: morphine 4 MG/ML injection 2 mg, oxyCODONE (OXY IR/ROXICODONE) 5 MG immediate release tablet  Dehydration - Plan: 0.9 %  sodium chloride infusion  Symptomatic anemia - Plan: Practitioner attestation of consent, Complete patient signature process for consent form, Type and screen, Care order/instruction, 0.9 %  sodium chloride infusion (Manually program via Guardrails IV Fluids), heparin lock flush 100 unit/mL, Prepare RBC, Transfuse RBC, acetaminophen (TYLENOL) tablet 650 mg, diphenhydrAMINE (BENADRYL) capsule 25 mg, Prepare RBC, Care order/instruction, Type and screen, Complete patient signature process for consent form, Practitioner attestation of consent, ABO/Rh, BPAM RBC, Transfuse RBC  Port-A-Cath in place - Plan: sodium chloride flush (NS) 0.9 % injection 10 mL  Leukocytosis, unspecified type - Plan: Urinalysis, Complete w Microscopic, Urine Culture, Culture, Blood, Culture, Blood   Metastatic neoplasm of the pancreas: Taylor Oconnor was recently noted to have disease progression on CT scans from 10/14/2018. She was transitioned to 2nd line chemotherapy and was dosed with cycle #1  of  gemcitibine and Abraxane on 10/21/2018. She is scheduled to be seen by Dr. Burr Medico next on 10/28/2018.  Shortness of breath, weakness, and dehydration: A CBC, chemistry panel, and blood sample for blood bank were collected today.  The patient was given 1 L of normal saline IV.  Right upper quadrant pain: Patient has been using Percocet 1-2 times daily but reports that she does not like how it makes her feel.  She was given morphine sulfate 2 mg IV x1.  The patient was told to stop using Percocet as it contains Tylenol.  She was given a prescription for oxycodone 5 mg, 1-2 p.o. every 4 hours as needed pain, #60 with no refills.  She was also instructed to maintain a good bowel regimen.  She was given information on medications to use as part of her discharge summary.   Leukocytosis: CBC returned today showing a WBC of 18.7 and an ANC of 18.  The patient is asymptomatic.  Blood cultures x2 were collected today.  The patient was unable to get a urine sample today.  She is given a collection container and has agreed to collect a urine specimen and return to the office tomorrow.  No antibiotic was given at this point.  We will await the results of her urinalysis.  Please see After Visit Summary for patient specific instructions.  Future Appointments  Date Time Provider [place]  10/28/2018 12:15 PM [facility] LAB 6 [facility] None  10/28/2018 12:30 PM [facility] Colon FLUSH [facility] None  10/28/2018  1:00 PM Truitt Merle, MD [facility] None  10/28/2018  1:30 PM [facility] INFUSION [facility] None  11/04/2018 10:30 AM [facility] LAB 2 [facility] None  11/04/2018 10:45 AM [facility] [place] FLUSH [facility] None  11/04/2018 11:30 AM  [facility] INFUSION [facility] None  11/07/2018  2:30 PM Unk Pinto, MD [facility] None  01/19/2019  3:00 PM Vicie Mutters, PA-C [facility] None  01/26/2019  9:00 AM [facility] LAB 1 [facility] None  01/26/2019  9:30 AM Magrinat, Virgie Dad, MD [facility] None   05/08/2019  2:00 PM Unk Pinto, MD [facility] None    Orders Placed This Encounter  Procedures   Urine Culture   Culture, Blood   Culture, Blood   CBC with Differential ([place])   CMP ([place] only)   Urinalysis, Complete w Microscopic   Practitioner attestation of consent   Complete patient signature process for consent form   Care order/instruction   Sample to Blood Bank   Type and screen   Prepare RBC   ABO/Rh       Subjective:   Patient ID:  Taylor Oconnor is a 68 y.o. (DOB Aug 15, 1950) female.  Chief Complaint:  Chief Complaint  Patient presents with   Fatigue    HPI Taylor Oconnor Is a 68 y.o. female with a diagnosis of a metastatic neoplasm of the pancreas. She was recently seen to have disease progression on CT scans from 10/14/2018. She transitioned to 2nd line chemotherapy and was dosed with cycle #1 of  gemcitibine and Abraxane on 10/21/2018.  Taylor Oconnor's husband called the off ice this morning reporting that Taylor Oconnor has not been able to eat all weekend but was taking in some liquids. She was having severe abdominal pain which she rated at 10/10. She was afebrile.  She reports that she has been having shortness of breath, dizziness, and anorexia along with right upper quadrant pain since her last chemotherapy.  She has Percocet at home for pain but has only been taking 2 daily.  She states that she does not like the way it makes her feel.  She is exceedingly weak.  It was attempted to obtain orthostatic blood pressure readings today however the patient was unable to stand for less than 5 seconds before she had to sit back down again.  It was noted that her pulse did increase from 101 to 110 BPM however no accurate blood pressure reading could be obtained.  Her hemoglobin was noted to be 8.4 at her last visit when she was treated with chemotherapy.  She denies hematuria, melena or bright red blood per rectum.  Her most recent CT scan  of the abdomen from 10/14/2018 returned showing the following:  Lower Chest: No acute findings.  Hepatobiliary: Innumerable new low-attenuation masses are seen throughout the liver since prior study, consistent with diffuse liver metastases. Index lesion in the posterior right hepatic lobe measures 5.2 x 3.5 cm on image 35/8. New diffuse portal vein thrombosis is also seen since prior exam.  Prior cholecystectomy again noted. Internal biliary stent is seen in place and there is no evidence of biliary ductal dilatation.  Pancreas: Soft tissue mass in the pancreatic head currently measures 2.2 x 1.7 cm, compared to 3.0 x 2.9 cm when measured in same planes on prior study.  Spleen: Within normal limits in size and appearance. Thrombosis of distal splenic vein is seen, which is new since prior study.  Adrenals/Urinary Tract: No masses identified. No evidence of hydronephrosis.  Stomach/Bowel: Gastric lap band remains in place. No evidence of obstruction, inflammatory process or abnormal fluid collections.  Vascular/Lymphatic: New mild lymphadenopathy in the portacaval and peripancreatic regions, with index lymph node in the portacaval space measuring 17 mm short axis on image  43/8. No abdominal aortic aneurysm.  Reproductive:  No mass or other significant abnormality.   Medications: I have reviewed the patient's current medications.  Allergies:  Allergies  Allergen Reactions   Oxaliplatin Anaphylaxis   Codeine Itching   Lipitor [Atorvastatin] Other (See Comments)    Severe fatigue. ? memory loss.   Ace Inhibitors Cough    Past Medical History:  Diagnosis Date   Allergy    SEASONAL   Anxiety    At risk for difficult insertion of breathing tube    Breast cancer of upper-outer quadrant of right female breast ([place]) 12/03/2014   Common bile duct (CBD) obstruction    Difficult intubation pt has large tonsils,   Family history of breast cancer    GERD  (gastroesophageal reflux disease)    hx - resolved with lap band surgery   H/O hiatal hernia    resolved with band surgery   H/O laparoscopic adjustable gastric banding 2010   History of kidney stones 02/2018   Hyperlipidemia    Hypertension    Hypokalemia    Insomnia    tx with zanax   Obesity    Pancreatic mass    PCOS (polycystic ovarian syndrome)    Personal history of radiation therapy    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) 12/14/2012   Type II or unspecified type diabetes mellitus without mention of complication, not stated as uncontrolled 12/14/2012   no meds, diet controlled   Wears contact lenses    Wears glasses     Past Surgical History:  Procedure Laterality Date   BACK SURGERY     cervical   BILIARY STENT PLACEMENT N/A 07/01/2018   Procedure: BILIARY STENT PLACEMENT;  Surgeon: Ladene Artist, MD;  Location: [facility];  Service: Endoscopy;  Laterality: N/A;  cbd brushing   BREAST BIOPSY     BREAST LUMPECTOMY Right 2016   BREAST LUMPECTOMY WITH NEEDLE LOCALIZATION AND AXILLARY SENTINEL LYMPH NODE BX Right 12/28/2014   Procedure: RIGHT BREAST LUMPECTOMY WITH TWO (2) NEEDLE LOCALIZATION AND AXILLARY SENTINEL LYMPH NODE BX;  Surgeon: Alphonsa Overall, MD;  Location: [facility];  Service: General;  Laterality: Right;   CESAREAN SECTION     x 1   CHOLECYSTECTOMY     COLONOSCOPY  09/29/2004   normal   CYSTOSCOPY WITH RETROGRADE PYELOGRAM, URETEROSCOPY AND STENT PLACEMENT Right 03/24/2018   Procedure: CYSTOSCOPY WITH RETROGRADE PYELOGRAM, URETEROSCOPY AND STENT PLACEMENT;  Surgeon: Franchot Gallo, MD;  Location: [facility];  Service: Urology;  Laterality: Right;  1 HR   DIAGNOSTIC LAPAROSCOPY     ERCP N/A 07/01/2018   Procedure: ENDOSCOPIC RETROGRADE CHOLANGIOPANCREATOGRAPHY (ERCP);  Surgeon: Ladene Artist, MD;  Location: [facility];  Service: Endoscopy;  Laterality: N/A;   ESOPHAGOGASTRODUODENOSCOPY (EGD)  WITH PROPOFOL N/A 07/14/2018   Procedure: ESOPHAGOGASTRODUODENOSCOPY (EGD) WITH PROPOFOL;  Surgeon: Milus Banister, MD;  Location: [facility];  Service: Endoscopy;  Laterality: N/A;   EUS N/A 07/14/2018   Procedure: UPPER ENDOSCOPIC ULTRASOUND (EUS) RADIAL;  Surgeon: Milus Banister, MD;  Location: [facility];  Service: Endoscopy;  Laterality: N/A;   FINE NEEDLE ASPIRATION  07/14/2018   Procedure: FINE NEEDLE ASPIRATION (FNA) RADIAL;  Surgeon: Milus Banister, MD;  Location: [facility];  Service: Endoscopy;;   HERNIA REPAIR  2008   IR IMAGING GUIDED PORT INSERTION  07/20/2018   LAPAROSCOPIC APPENDECTOMY N/A 08/06/2012   Procedure: APPENDECTOMY LAPAROSCOPIC;  Surgeon: Earnstine Regal, MD;  Location: [facility];  Service:  General;  Laterality: N/A;   LAPAROSCOPIC GASTRIC BANDING WITH HIATAL HERNIA REPAIR N/A Nov 2008   SPHINCTEROTOMY  07/01/2018   Procedure: SPHINCTEROTOMY;  Surgeon: Ladene Artist, MD;  Location: [facility];  Service: Endoscopy;;   TUBAL LIGATION      Family History  Problem Relation Age of Onset   Cirrhosis Mother    Alcohol abuse Mother    Liver disease Mother    Diabetes Father    Hypertension Father    Stroke Father    Heart disease Father    AAA (abdominal aortic aneurysm) Sister 33       twin sister   Cancer Cousin        Breast   Breast cancer Cousin        x3   Colon cancer Neg Hx    Rectal cancer Neg Hx    Stomach cancer Neg Hx     Social History   Socioeconomic History   Marital status: Married    Spouse name: Not on file   Number of children: 1   Years of education: Not on file   Highest education level: Not on file  Occupational History   Not on file  Social Needs   Financial resource strain: Not on file   Food insecurity    Worry: Not on file    Inability: Not on file   Transportation needs    Medical: Not on file    Non-medical: Not on file  Tobacco Use   Smoking status: Never Smoker   Smokeless  tobacco: Never Used  Substance and Sexual Activity   Alcohol use: No    Alcohol/week: 0.0 standard drinks   Drug use: No   Sexual activity: Not Currently    Birth control/protection: Post-menopausal  Lifestyle   Physical activity    Days per week: Not on file    Minutes per session: Not on file   Stress: Not on file  Relationships   Social connections    Talks on phone: Not on file    Gets together: Not on file    Attends religious service: Not on file    Active member of club or organization: Not on file    Attends meetings of clubs or organizations: Not on file    Relationship status: Not on file   Intimate partner violence    Fear of current or ex partner: Not on file    Emotionally abused: Not on file    Physically abused: Not on file    Forced sexual activity: Not on file  Other Topics Concern   Not on file  Social History Narrative   Not on file    Past Medical History, Surgical history, Social history, and Family history were reviewed and updated as appropriate.   Please see review of systems for further details on the patient's review from today.   Review of Systems:  Review of Systems  Constitutional: Positive for appetite change. Negative for activity change, chills, diaphoresis and fever.  HENT: Negative for trouble swallowing.   Respiratory: Positive for shortness of breath. Negative for cough and chest tightness.   Cardiovascular: Negative for chest pain, palpitations and leg swelling.  Gastrointestinal: Positive for abdominal pain. Negative for abdominal distention, constipation, diarrhea, nausea and vomiting.  Neurological: Positive for dizziness and weakness.    Objective:   Physical Exam:  BP 124/78    Pulse 84    Temp 98 F (36.7 C) (Oral)    Resp 16  Ht 5\' 3"  (1.6 m)    Wt 168 lb 9.6 oz (76.5 kg)    SpO2 99%    BMI 29.87 kg/m  ECOG: 1  Physical Exam Constitutional:      General: She is not in acute distress.    Appearance: She is  not diaphoretic.  HENT:     Head: Normocephalic and atraumatic.     Mouth/Throat:     Pharynx: No oropharyngeal exudate.  Neck:     Musculoskeletal: Normal range of motion and neck supple.  Cardiovascular:     Rate and Rhythm: Normal rate and regular rhythm.     Heart sounds: Normal heart sounds. No murmur. No friction rub. No gallop.   Pulmonary:     Effort: Pulmonary effort is normal. No respiratory distress.     Breath sounds: Normal breath sounds. No wheezing or rales.  Abdominal:     General: Bowel sounds are normal. There is no distension.     Palpations: Abdomen is soft. There is no mass.     Tenderness: There is abdominal tenderness (RUQ tenderness). There is no guarding or rebound.    Lymphadenopathy:     Cervical: No cervical adenopathy.  Skin:    General: Skin is warm and dry.     Coloration: Skin is pale.     Findings: No erythema or rash.  Neurological:     Mental Status: She is alert.     Coordination: Coordination normal.     Gait: Gait abnormal (The patient is ambulating with wheelchair.).  Psychiatric:        Behavior: Behavior normal.        Thought Content: Thought content normal.        Judgment: Judgment normal.     Lab Review:     Component Value Date/Time   NA 132 (L) 10/24/2018 1032   NA 141 12/08/2016 1432   K 4.4 10/24/2018 1032   K 3.9 12/08/2016 1432   CL 96 (L) 10/24/2018 1032   CO2 27 10/24/2018 1032   CO2 27 12/08/2016 1432   GLUCOSE 165 (H) 10/24/2018 1032   GLUCOSE 102 12/08/2016 1432   BUN 30 (H) 10/24/2018 1032   BUN 19.3 12/08/2016 1432   CREATININE 0.74 10/24/2018 1032   CREATININE 0.72 06/29/2018 1136   CREATININE 1.0 12/08/2016 1432   CALCIUM 8.4 (L) 10/24/2018 1032   CALCIUM 9.5 12/08/2016 1432   PROT 5.8 (L) 10/24/2018 1032   PROT 6.8 12/08/2016 1432   ALBUMIN 2.2 (L) 10/24/2018 1032   ALBUMIN 3.5 12/08/2016 1432   AST 115 (H) 10/24/2018 1032   AST 16 12/08/2016 1432   ALT 47 (H) 10/24/2018 1032   ALT 22 12/08/2016  1432   ALKPHOS 364 (H) 10/24/2018 1032   ALKPHOS 61 12/08/2016 1432   BILITOT 1.0 10/24/2018 1032   BILITOT 0.30 12/08/2016 1432   GFRNONAA >60 10/24/2018 1032   GFRNONAA 87 06/29/2018 1136   GFRAA >60 10/24/2018 1032   GFRAA 101 06/29/2018 1136       Component Value Date/Time   WBC 18.7 (H) 10/24/2018 1032   WBC 7.4 07/20/2018 1125   RBC 2.49 (L) 10/24/2018 1032   HGB 7.9 (L) 10/24/2018 1032   HGB 13.2 12/08/2016 1432   HCT 23.8 (L) 10/24/2018 1032   HCT 40.0 12/08/2016 1432   PLT 265 10/24/2018 1032   PLT 343 12/08/2016 1432   MCV 95.6 10/24/2018 1032   MCV 95.0 12/08/2016 1432   MCH 31.7 10/24/2018 1032  MCHC 33.2 10/24/2018 1032   RDW 17.9 (H) 10/24/2018 1032   RDW 13.1 12/08/2016 1432   LYMPHSABS 0.4 (L) 10/24/2018 1032   LYMPHSABS 2.5 12/08/2016 1432   MONOABS 0.1 10/24/2018 1032   MONOABS 0.8 12/08/2016 1432   EOSABS 0.0 10/24/2018 1032   EOSABS 0.2 12/08/2016 1432   BASOSABS 0.1 10/24/2018 1032   BASOSABS 0.0 12/08/2016 1432   -------------------------------  Imaging from last 24 hours (if applicable):  Radiology interpretation: Ct Abdomen Pelvis W Contrast  Result Date: 10/14/2018 CLINICAL DATA:  Follow-up metastatic pancreatic carcinoma. EXAM: CT ABDOMEN AND PELVIS WITH CONTRAST TECHNIQUE: Multidetector CT imaging of the abdomen and pelvis was performed using the standard protocol following bolus administration of intravenous contrast. CONTRAST:  153mL OMNIPAQUE IOHEXOL 300 MG/ML  SOLN COMPARISON:  MRI on 07/01/2018 FINDINGS: Lower Chest: No acute findings. Hepatobiliary: Innumerable new low-attenuation masses are seen throughout the liver since prior study, consistent with diffuse liver metastases. Index lesion in the posterior right hepatic lobe measures 5.2 x 3.5 cm on image 35/8. New diffuse portal vein thrombosis is also seen since prior exam. Prior cholecystectomy again noted. Internal biliary stent is seen in place and there is no evidence of biliary  ductal dilatation. Pancreas: Soft tissue mass in the pancreatic head currently measures 2.2 x 1.7 cm, compared to 3.0 x 2.9 cm when measured in same planes on prior study. Spleen: Within normal limits in size and appearance. Thrombosis of distal splenic vein is seen, which is new since prior study. Adrenals/Urinary Tract: No masses identified. No evidence of hydronephrosis. Stomach/Bowel: Gastric lap band remains in place. No evidence of obstruction, inflammatory process or abnormal fluid collections. Vascular/Lymphatic: New mild lymphadenopathy in the portacaval and peripancreatic regions, with index lymph node in the portacaval space measuring 17 mm short axis on image 43/8. No abdominal aortic aneurysm. Reproductive:  No mass or other significant abnormality. Other:  None. Musculoskeletal:  No suspicious bone lesions identified. IMPRESSION: Marked increase in diffuse liver metastases. New mild portacaval and peripancreatic lymphadenopathy, consistent with metastatic disease. Mild decrease in size of pancreatic head mass. Diffuse portal vein thrombosis, new since prior exam. These results will be called to the ordering clinician or representative by the Radiologist Assistant, and communication documented in the PACS or zVision Dashboard. Electronically Signed   By: Marlaine Hind M.D.   On: 10/14/2018 15:58        This case was discussed with Dr. Burr Medico. She expressed agreement with my management of this patient.

## 2018-10-24 NOTE — Progress Notes (Signed)
Pt received 1 unit PRBCs today and 1L IVF NS.  Tolerated both well.  Reports feeling better at end of tx and with morphine IV administered.  Denies pain at time of d/c.  Verbalizes understanding of d/c instructions including blood transfusion reactions and to f/u as needed.  Denies any questions at time of d/c.  Two sets of blood cultures drawn from patient before any abx ordered/admin per PA Van/MD Burr Medico.  Urine specimen cup sent home with pt who will return to [place] tomorrow with sample for the lab.  Lab aware.  Pt verbalized understanding of collection instructions.

## 2018-10-24 NOTE — Patient Instructions (Addendum)
Blood Transfusion, Adult, Care After This sheet gives you information about how to care for yourself after your procedure. Your doctor may also give you more specific instructions. If you have problems or questions, contact your doctor. Follow these instructions at home:   Take over-the-counter and prescription medicines only as told by your doctor.  Go back to your normal activities as told by your doctor.  Follow instructions from your doctor about how to take care of the area where an IV tube was put into your vein (insertion site). Make sure you: ? Wash your hands with soap and water before you change your bandage (dressing). If there is no soap and water, use hand sanitizer. ? Change your bandage as told by your doctor.  Check your IV insertion site every day for signs of infection. Check for: ? More redness, swelling, or pain. ? More fluid or blood. ? Warmth. ? Pus or a bad smell. Contact a doctor if:  You have more redness, swelling, or pain around the IV insertion site.  You have more fluid or blood coming from the IV insertion site.  Your IV insertion site feels warm to the touch.  You have pus or a bad smell coming from the IV insertion site.  Your pee (urine) turns pink, red, or brown.  You feel weak after doing your normal activities. Get help right away if:  You have signs of a serious allergic or body defense (immune) system reaction, including: ? Itchiness. ? Hives. ? Trouble breathing. ? Anxiety. ? Pain in your chest or lower back. ? Fever, flushing, and chills. ? Fast pulse. ? Rash. ? Watery poop (diarrhea). ? Throwing up (vomiting). ? Dark pee. ? Serious headache. ? Dizziness. ? Stiff neck. ? Yellow color in your face or the white parts of your eyes (jaundice). Summary  After a blood transfusion, return to your normal activities as told by your doctor.  Every day, check for signs of infection where the IV tube was put into your vein.  Some  signs of infection are warm skin, more redness and pain, more fluid or blood, and pus or a bad smell where the needle went in.  Contact your doctor if you feel weak or have any unusual symptoms. This information is not intended to replace advice given to you by your health care provider. Make sure you discuss any questions you have with your health care provider. Document Released: 01/19/2014 Document Revised: 05/05/2017 Document Reviewed: 08/23/2015 Elsevier Patient Education  [employer].    Constipation Management  Senna-S, 1 to 2 tablets twice daily  MiraLAX 17 grams in 8 ounces of liquids 1 to 2 times daily as needed   Remember to remain well hydrated. Drink, Drink, Drink non-caffeinated beverages.   Adjust these medications based on your response. If your bowel movements become too loose then decrease the amount of Senna-S and/or MiraLAX that you are using. If your bowel movements become too firm or are difficult to pass, the increase the amount of Senna-S and/or MiraLAX that you are using and increase your intake of water.

## 2018-10-25 DIAGNOSIS — D649 Anemia, unspecified: Secondary | ICD-10-CM | POA: Diagnosis not present

## 2018-10-25 DIAGNOSIS — K5900 Constipation, unspecified: Secondary | ICD-10-CM | POA: Diagnosis not present

## 2018-10-25 DIAGNOSIS — G893 Neoplasm related pain (acute) (chronic): Secondary | ICD-10-CM | POA: Diagnosis not present

## 2018-10-25 DIAGNOSIS — Z7982 Long term (current) use of aspirin: Secondary | ICD-10-CM | POA: Diagnosis not present

## 2018-10-25 DIAGNOSIS — I81 Portal vein thrombosis: Secondary | ICD-10-CM | POA: Diagnosis not present

## 2018-10-25 DIAGNOSIS — E860 Dehydration: Secondary | ICD-10-CM | POA: Diagnosis not present

## 2018-10-25 DIAGNOSIS — C50411 Malignant neoplasm of upper-outer quadrant of right female breast: Secondary | ICD-10-CM | POA: Diagnosis not present

## 2018-10-25 DIAGNOSIS — R0602 Shortness of breath: Secondary | ICD-10-CM | POA: Diagnosis not present

## 2018-10-25 DIAGNOSIS — C250 Malignant neoplasm of head of pancreas: Secondary | ICD-10-CM | POA: Diagnosis not present

## 2018-10-25 DIAGNOSIS — D72829 Elevated white blood cell count, unspecified: Secondary | ICD-10-CM | POA: Diagnosis not present

## 2018-10-25 DIAGNOSIS — E119 Type 2 diabetes mellitus without complications: Secondary | ICD-10-CM | POA: Diagnosis not present

## 2018-10-25 DIAGNOSIS — I10 Essential (primary) hypertension: Secondary | ICD-10-CM | POA: Diagnosis not present

## 2018-10-25 DIAGNOSIS — Z7901 Long term (current) use of anticoagulants: Secondary | ICD-10-CM | POA: Diagnosis not present

## 2018-10-25 DIAGNOSIS — K219 Gastro-esophageal reflux disease without esophagitis: Secondary | ICD-10-CM | POA: Diagnosis not present

## 2018-10-25 DIAGNOSIS — E039 Hypothyroidism, unspecified: Secondary | ICD-10-CM | POA: Diagnosis not present

## 2018-10-25 DIAGNOSIS — Z79899 Other long term (current) drug therapy: Secondary | ICD-10-CM | POA: Diagnosis not present

## 2018-10-25 DIAGNOSIS — Z79811 Long term (current) use of aromatase inhibitors: Secondary | ICD-10-CM | POA: Diagnosis not present

## 2018-10-25 DIAGNOSIS — Z5111 Encounter for antineoplastic chemotherapy: Secondary | ICD-10-CM | POA: Diagnosis not present

## 2018-10-25 DIAGNOSIS — Z170 Estrogen receptor positive status [ER+]: Secondary | ICD-10-CM | POA: Diagnosis not present

## 2018-10-25 DIAGNOSIS — C787 Secondary malignant neoplasm of liver and intrahepatic bile duct: Secondary | ICD-10-CM | POA: Diagnosis not present

## 2018-10-25 DIAGNOSIS — E785 Hyperlipidemia, unspecified: Secondary | ICD-10-CM | POA: Diagnosis not present

## 2018-10-25 DIAGNOSIS — Z923 Personal history of irradiation: Secondary | ICD-10-CM | POA: Diagnosis not present

## 2018-10-25 LAB — URINALYSIS, COMPLETE (UACMP) WITH MICROSCOPIC
Bacteria, UA: NONE SEEN
Bilirubin Urine: NEGATIVE
Glucose, UA: NEGATIVE mg/dL
Ketones, ur: NEGATIVE mg/dL
Leukocytes,Ua: NEGATIVE
Nitrite: NEGATIVE
Protein, ur: NEGATIVE mg/dL
Specific Gravity, Urine: 1.024 (ref 1.005–1.030)
pH: 6 (ref 5.0–8.0)

## 2018-10-25 LAB — BPAM RBC
Blood Product Expiration Date: 202010192359
ISSUE DATE / TIME: 202010121224
Unit Type and Rh: 6200

## 2018-10-25 LAB — TYPE AND SCREEN
ABO/RH(D): A POS
Antibody Screen: NEGATIVE
Unit division: 0

## 2018-10-26 LAB — URINE CULTURE: Culture: <10000 — AB

## 2018-10-26 NOTE — Progress Notes (Signed)
These preliminary result these preliminary results were noted.  Awaiting final report.

## 2018-10-27 NOTE — Progress Notes (Signed)
These preliminary result these preliminary results were noted.  Awaiting final report.

## 2018-10-28 ENCOUNTER — Other Ambulatory Visit: Payer: Self-pay

## 2018-10-28 ENCOUNTER — Encounter: Payer: Self-pay | Admitting: Hematology

## 2018-10-28 ENCOUNTER — Telehealth: Payer: Self-pay

## 2018-10-28 ENCOUNTER — Inpatient Hospital Stay: Payer: Medicare Other

## 2018-10-28 ENCOUNTER — Inpatient Hospital Stay (HOSPITAL_BASED_OUTPATIENT_CLINIC_OR_DEPARTMENT_OTHER): Payer: Medicare Other | Admitting: Hematology

## 2018-10-28 VITALS — BP 110/67 | HR 105 | Temp 98.7°F | Resp 18 | Ht 63.0 in | Wt 170.5 lb

## 2018-10-28 DIAGNOSIS — D649 Anemia, unspecified: Secondary | ICD-10-CM | POA: Diagnosis not present

## 2018-10-28 DIAGNOSIS — C50411 Malignant neoplasm of upper-outer quadrant of right female breast: Secondary | ICD-10-CM

## 2018-10-28 DIAGNOSIS — Z170 Estrogen receptor positive status [ER+]: Secondary | ICD-10-CM

## 2018-10-28 DIAGNOSIS — D72829 Elevated white blood cell count, unspecified: Secondary | ICD-10-CM | POA: Diagnosis not present

## 2018-10-28 DIAGNOSIS — Z7901 Long term (current) use of anticoagulants: Secondary | ICD-10-CM | POA: Diagnosis not present

## 2018-10-28 DIAGNOSIS — Z5111 Encounter for antineoplastic chemotherapy: Secondary | ICD-10-CM | POA: Diagnosis not present

## 2018-10-28 DIAGNOSIS — E039 Hypothyroidism, unspecified: Secondary | ICD-10-CM | POA: Diagnosis not present

## 2018-10-28 DIAGNOSIS — K1231 Oral mucositis (ulcerative) due to antineoplastic therapy: Secondary | ICD-10-CM

## 2018-10-28 DIAGNOSIS — Z79899 Other long term (current) drug therapy: Secondary | ICD-10-CM | POA: Diagnosis not present

## 2018-10-28 DIAGNOSIS — C250 Malignant neoplasm of head of pancreas: Secondary | ICD-10-CM | POA: Diagnosis not present

## 2018-10-28 DIAGNOSIS — Z7982 Long term (current) use of aspirin: Secondary | ICD-10-CM | POA: Diagnosis not present

## 2018-10-28 DIAGNOSIS — C787 Secondary malignant neoplasm of liver and intrahepatic bile duct: Secondary | ICD-10-CM | POA: Diagnosis not present

## 2018-10-28 DIAGNOSIS — G893 Neoplasm related pain (acute) (chronic): Secondary | ICD-10-CM | POA: Diagnosis not present

## 2018-10-28 DIAGNOSIS — Z95828 Presence of other vascular implants and grafts: Secondary | ICD-10-CM

## 2018-10-28 DIAGNOSIS — I10 Essential (primary) hypertension: Secondary | ICD-10-CM | POA: Diagnosis not present

## 2018-10-28 DIAGNOSIS — K5900 Constipation, unspecified: Secondary | ICD-10-CM | POA: Diagnosis not present

## 2018-10-28 DIAGNOSIS — R0602 Shortness of breath: Secondary | ICD-10-CM | POA: Diagnosis not present

## 2018-10-28 DIAGNOSIS — E785 Hyperlipidemia, unspecified: Secondary | ICD-10-CM | POA: Diagnosis not present

## 2018-10-28 DIAGNOSIS — Z79811 Long term (current) use of aromatase inhibitors: Secondary | ICD-10-CM | POA: Diagnosis not present

## 2018-10-28 DIAGNOSIS — E119 Type 2 diabetes mellitus without complications: Secondary | ICD-10-CM | POA: Diagnosis not present

## 2018-10-28 DIAGNOSIS — E860 Dehydration: Secondary | ICD-10-CM | POA: Diagnosis not present

## 2018-10-28 DIAGNOSIS — K219 Gastro-esophageal reflux disease without esophagitis: Secondary | ICD-10-CM | POA: Diagnosis not present

## 2018-10-28 DIAGNOSIS — I81 Portal vein thrombosis: Secondary | ICD-10-CM | POA: Diagnosis not present

## 2018-10-28 DIAGNOSIS — Z923 Personal history of irradiation: Secondary | ICD-10-CM | POA: Diagnosis not present

## 2018-10-28 LAB — CBC WITH DIFFERENTIAL (CANCER CENTER ONLY)
Abs Immature Granulocytes: 0.11 10*3/uL — ABNORMAL HIGH (ref 0.00–0.07)
Basophils Absolute: 0 10*3/uL (ref 0.0–0.1)
Basophils Relative: 0 %
Eosinophils Absolute: 0.1 10*3/uL (ref 0.0–0.5)
Eosinophils Relative: 1 %
HCT: 27.3 % — ABNORMAL LOW (ref 36.0–46.0)
Hemoglobin: 8.9 g/dL — ABNORMAL LOW (ref 12.0–15.0)
Immature Granulocytes: 2 %
Lymphocytes Relative: 24 %
Lymphs Abs: 1.6 10*3/uL (ref 0.7–4.0)
MCH: 31.2 pg (ref 26.0–34.0)
MCHC: 32.6 g/dL (ref 30.0–36.0)
MCV: 95.8 fL (ref 80.0–100.0)
Monocytes Absolute: 0.6 10*3/uL (ref 0.1–1.0)
Monocytes Relative: 8 %
Neutro Abs: 4.5 10*3/uL (ref 1.7–7.7)
Neutrophils Relative %: 65 %
Platelet Count: 132 10*3/uL — ABNORMAL LOW (ref 150–400)
RBC: 2.85 MIL/uL — ABNORMAL LOW (ref 3.87–5.11)
RDW: 17.2 % — ABNORMAL HIGH (ref 11.5–15.5)
WBC Count: 6.9 10*3/uL (ref 4.0–10.5)
nRBC: 6.1 % — ABNORMAL HIGH (ref 0.0–0.2)

## 2018-10-28 LAB — CMP (CANCER CENTER ONLY)
ALT: 31 U/L (ref 0–44)
AST: 54 U/L — ABNORMAL HIGH (ref 15–41)
Albumin: 2.4 g/dL — ABNORMAL LOW (ref 3.5–5.0)
Alkaline Phosphatase: 425 U/L — ABNORMAL HIGH (ref 38–126)
Anion gap: 10 (ref 5–15)
BUN: 18 mg/dL (ref 8–23)
CO2: 27 mmol/L (ref 22–32)
Calcium: 8.5 mg/dL — ABNORMAL LOW (ref 8.9–10.3)
Chloride: 99 mmol/L (ref 98–111)
Creatinine: 0.7 mg/dL (ref 0.44–1.00)
GFR, Est AFR Am: >60 mL/min (ref >60)
GFR, Estimated: >60 mL/min (ref >60)
Glucose, Bld: 136 mg/dL — ABNORMAL HIGH (ref 70–99)
Potassium: 4.2 mmol/L (ref 3.5–5.1)
Sodium: 136 mmol/L (ref 135–145)
Total Bilirubin: 0.9 mg/dL (ref 0.3–1.2)
Total Protein: 5.5 g/dL — ABNORMAL LOW (ref 6.5–8.1)

## 2018-10-28 MED ORDER — SODIUM CHLORIDE 0.9 % IV SOLN
INTRAVENOUS | Status: DC
  Administered 2018-10-28: 15:00:00 via INTRAVENOUS
  Filled 2018-10-28: qty 250

## 2018-10-28 MED ORDER — SODIUM CHLORIDE 0.9 % IV SOLN
INTRAVENOUS | Status: AC
  Administered 2018-10-28: 15:00:00 via INTRAVENOUS
  Filled 2018-10-28 (×2): qty 250

## 2018-10-28 MED ORDER — SODIUM CHLORIDE 0.9% FLUSH
10.0000 mL | Freq: Once | INTRAVENOUS | Status: AC
  Administered 2018-10-28: 17:00:00 10 mL
  Filled 2018-10-28: qty 10

## 2018-10-28 MED ORDER — HEPARIN SOD (PORK) LOCK FLUSH 100 UNIT/ML IV SOLN
500.0000 [IU] | Freq: Once | INTRAVENOUS | Status: AC
  Administered 2018-10-28: 17:00:00 500 [IU]
  Filled 2018-10-28: qty 5

## 2018-10-28 MED ORDER — SODIUM CHLORIDE 0.9% FLUSH
10.0000 mL | Freq: Once | INTRAVENOUS | Status: AC
  Administered 2018-10-28: 10 mL
  Filled 2018-10-28: qty 10

## 2018-10-28 MED ORDER — MAGIC MOUTHWASH: 5.0000 mL | Freq: Four times a day (QID) | ORAL | 1 refills | Status: DC | PRN

## 2018-10-28 NOTE — Telephone Encounter (Signed)
Called in order for Magic Mouthwash to [place] on [address] per Dr. Ernestina Penna instructions: (Nystatin, Benadryl, Mylanta in 1:1:1 ratio; Take 5 mL 4 times daily as needed for mouth pain; Dispense 272mL with 1 refill). Pharmacist confirmed order would be ready for patient today.

## 2018-10-28 NOTE — Progress Notes (Signed)
These preliminary result these preliminary results were noted.  Awaiting final report.

## 2018-10-28 NOTE — Progress Notes (Signed)
Faxed Palliative Care referral to [place] at [phone]. Received confirmation that fax went through successfully.

## 2018-10-28 NOTE — Patient Instructions (Signed)
Dehydration, Adult  Dehydration is when there is not enough fluid or water in your body. This happens when you lose more fluids than you take in. Dehydration can range from mild to very bad. It should be treated right away to keep it from getting very bad. Symptoms of mild dehydration may include:  Thirst.  Dry lips.  Slightly dry mouth.  Dry, warm skin.  Dizziness. Symptoms of moderate dehydration may include:  Very dry mouth.  Muscle cramps.  Dark pee (urine). Pee may be the color of tea.  Your body making less pee.  Your eyes making fewer tears.  Heartbeat that is uneven or faster than normal (palpitations).  Headache.  Light-headedness, especially when you stand up from sitting.  Fainting (syncope). Symptoms of very bad dehydration may include:  Changes in skin, such as: ? Cold and clammy skin. ? Blotchy (mottled) or pale skin. ? Skin that does not quickly return to normal after being lightly pinched and let go (poor skin turgor).  Changes in body fluids, such as: ? Feeling very thirsty. ? Your eyes making fewer tears. ? Not sweating when body temperature is high, such as in hot weather. ? Your body making very little pee.  Changes in vital signs, such as: ? Weak pulse. ? Pulse that is more than 100 beats a minute when you are sitting still. ? Fast breathing. ? Low blood pressure.  Other changes, such as: ? Sunken eyes. ? Cold hands and feet. ? Confusion. ? Lack of energy (lethargy). ? Trouble waking up from sleep. ? Short-term weight loss. ? Unconsciousness. Follow these instructions at home:   If told by your doctor, drink an ORS: ? Make an ORS by using instructions on the package. ? Start by drinking small amounts, about  cup (120 mL) every 5-10 minutes. ? Slowly drink more until you have had the amount that your doctor said to have.  Drink enough clear fluid to keep your pee clear or pale yellow. If you were told to drink an ORS, finish the  ORS first, then start slowly drinking clear fluids. Drink fluids such as: ? Water. Do not drink only water by itself. Doing that can make the salt (sodium) level in your body get too low (hyponatremia). ? Ice chips. ? Fruit juice that you have added water to (diluted). ? Low-calorie sports drinks.  Avoid: ? Alcohol. ? Drinks that have a lot of sugar. These include high-calorie sports drinks, fruit juice that does not have water added, and soda. ? Caffeine. ? Foods that are greasy or have a lot of fat or sugar.  Take over-the-counter and prescription medicines only as told by your doctor.  Do not take salt tablets. Doing that can make the salt level in your body get too high (hypernatremia).  Eat foods that have minerals (electrolytes). Examples include bananas, oranges, potatoes, tomatoes, and spinach.  Keep all follow-up visits as told by your doctor. This is important. Contact a doctor if:  You have belly (abdominal) pain that: ? Gets worse. ? Stays in one area (localizes).  You have a rash.  You have a stiff neck.  You get angry or annoyed more easily than normal (irritability).  You are more sleepy than normal.  You have a harder time waking up than normal.  You feel: ? Weak. ? Dizzy. ? Very thirsty.  You have peed (urinated) only a small amount of very dark pee during 6-8 hours. Get help right away if:  You have   symptoms of very bad dehydration.  You cannot drink fluids without throwing up (vomiting).  Your symptoms get worse with treatment.  You have a fever.  You have a very bad headache.  You are throwing up or having watery poop (diarrhea) and it: ? Gets worse. ? Does not go away.  You have blood or something green (bile) in your throw-up.  You have blood in your poop (stool). This may cause poop to look black and tarry.  You have not peed in 6-8 hours.  You pass out (faint).  Your heart rate when you are sitting still is more than 100 beats a  minute.  You have trouble breathing. This information is not intended to replace advice given to you by your health care provider. Make sure you discuss any questions you have with your health care provider. Document Released: 10/25/2008 Document Revised: 12/11/2016 Document Reviewed: 02/22/2015 Elsevier Patient Education  2020 [employer]  

## 2018-10-29 LAB — CULTURE, BLOOD (SINGLE)
Culture: NO GROWTH
Culture: NO GROWTH
Special Requests: ADEQUATE
Special Requests: ADEQUATE

## 2018-10-30 DIAGNOSIS — R197 Diarrhea, unspecified: Secondary | ICD-10-CM | POA: Diagnosis not present

## 2018-10-30 DIAGNOSIS — R1084 Generalized abdominal pain: Secondary | ICD-10-CM | POA: Diagnosis not present

## 2018-10-31 ENCOUNTER — Other Ambulatory Visit: Payer: Medicare Other

## 2018-10-31 ENCOUNTER — Other Ambulatory Visit: Payer: Self-pay

## 2018-10-31 ENCOUNTER — Ambulatory Visit: Payer: Medicare Other

## 2018-10-31 ENCOUNTER — Emergency Department (HOSPITAL_COMMUNITY)
Admission: EM | Admit: 2018-10-31 | Discharge: 2018-10-31 | Disposition: A | Discharge status: Home / Self Care | Payer: Medicare Other | Source: Home / Self Care | Attending: Emergency Medicine | Admitting: Emergency Medicine

## 2018-10-31 ENCOUNTER — Telehealth: Payer: Self-pay | Admitting: Hematology

## 2018-10-31 ENCOUNTER — Ambulatory Visit: Payer: Medicare Other | Admitting: Nurse Practitioner

## 2018-10-31 DIAGNOSIS — R52 Pain, unspecified: Secondary | ICD-10-CM | POA: Diagnosis not present

## 2018-10-31 DIAGNOSIS — G893 Neoplasm related pain (acute) (chronic): Secondary | ICD-10-CM | POA: Diagnosis not present

## 2018-10-31 DIAGNOSIS — E785 Hyperlipidemia, unspecified: Secondary | ICD-10-CM | POA: Diagnosis not present

## 2018-10-31 DIAGNOSIS — E119 Type 2 diabetes mellitus without complications: Secondary | ICD-10-CM | POA: Insufficient documentation

## 2018-10-31 DIAGNOSIS — R197 Diarrhea, unspecified: Secondary | ICD-10-CM | POA: Diagnosis not present

## 2018-10-31 DIAGNOSIS — D649 Anemia, unspecified: Secondary | ICD-10-CM | POA: Diagnosis not present

## 2018-10-31 DIAGNOSIS — R1084 Generalized abdominal pain: Secondary | ICD-10-CM

## 2018-10-31 DIAGNOSIS — K5641 Fecal impaction: Secondary | ICD-10-CM

## 2018-10-31 DIAGNOSIS — K219 Gastro-esophageal reflux disease without esophagitis: Secondary | ICD-10-CM | POA: Diagnosis not present

## 2018-10-31 DIAGNOSIS — E871 Hypo-osmolality and hyponatremia: Secondary | ICD-10-CM | POA: Diagnosis not present

## 2018-10-31 DIAGNOSIS — N390 Urinary tract infection, site not specified: Secondary | ICD-10-CM | POA: Diagnosis not present

## 2018-10-31 DIAGNOSIS — R531 Weakness: Secondary | ICD-10-CM | POA: Diagnosis not present

## 2018-10-31 DIAGNOSIS — C799 Secondary malignant neoplasm of unspecified site: Secondary | ICD-10-CM | POA: Diagnosis not present

## 2018-10-31 DIAGNOSIS — R279 Unspecified lack of coordination: Secondary | ICD-10-CM | POA: Diagnosis not present

## 2018-10-31 DIAGNOSIS — C259 Malignant neoplasm of pancreas, unspecified: Secondary | ICD-10-CM | POA: Diagnosis not present

## 2018-10-31 DIAGNOSIS — E861 Hypovolemia: Secondary | ICD-10-CM | POA: Diagnosis not present

## 2018-10-31 DIAGNOSIS — R748 Abnormal levels of other serum enzymes: Secondary | ICD-10-CM | POA: Diagnosis not present

## 2018-10-31 DIAGNOSIS — Z66 Do not resuscitate: Secondary | ICD-10-CM | POA: Diagnosis not present

## 2018-10-31 DIAGNOSIS — G4700 Insomnia, unspecified: Secondary | ICD-10-CM | POA: Diagnosis not present

## 2018-10-31 DIAGNOSIS — I81 Portal vein thrombosis: Secondary | ICD-10-CM | POA: Diagnosis not present

## 2018-10-31 DIAGNOSIS — I959 Hypotension, unspecified: Secondary | ICD-10-CM | POA: Diagnosis not present

## 2018-10-31 DIAGNOSIS — Z79899 Other long term (current) drug therapy: Secondary | ICD-10-CM | POA: Insufficient documentation

## 2018-10-31 DIAGNOSIS — Z515 Encounter for palliative care: Secondary | ICD-10-CM | POA: Diagnosis not present

## 2018-10-31 DIAGNOSIS — C787 Secondary malignant neoplasm of liver and intrahepatic bile duct: Secondary | ICD-10-CM | POA: Diagnosis not present

## 2018-10-31 DIAGNOSIS — I10 Essential (primary) hypertension: Secondary | ICD-10-CM | POA: Insufficient documentation

## 2018-10-31 DIAGNOSIS — Z743 Need for continuous supervision: Secondary | ICD-10-CM | POA: Diagnosis not present

## 2018-10-31 DIAGNOSIS — B9689 Other specified bacterial agents as the cause of diseases classified elsewhere: Secondary | ICD-10-CM | POA: Diagnosis not present

## 2018-10-31 DIAGNOSIS — R000 Tachycardia, unspecified: Secondary | ICD-10-CM | POA: Diagnosis not present

## 2018-10-31 DIAGNOSIS — R627 Adult failure to thrive: Secondary | ICD-10-CM | POA: Diagnosis not present

## 2018-10-31 LAB — COMPREHENSIVE METABOLIC PANEL
ALT: 47 U/L — ABNORMAL HIGH (ref 0–44)
AST: 66 U/L — ABNORMAL HIGH (ref 15–41)
Albumin: 2.5 g/dL — ABNORMAL LOW (ref 3.5–5.0)
Alkaline Phosphatase: 438 U/L — ABNORMAL HIGH (ref 38–126)
Anion gap: 9 (ref 5–15)
BUN: 18 mg/dL (ref 8–23)
CO2: 23 mmol/L (ref 22–32)
Calcium: 8.3 mg/dL — ABNORMAL LOW (ref 8.9–10.3)
Chloride: 101 mmol/L (ref 98–111)
Creatinine, Ser: 0.63 mg/dL (ref 0.44–1.00)
GFR calc Af Amer: >60 mL/min (ref >60)
GFR calc non Af Amer: >60 mL/min (ref >60)
Glucose, Bld: 144 mg/dL — ABNORMAL HIGH (ref 70–99)
Potassium: 3.6 mmol/L (ref 3.5–5.1)
Sodium: 133 mmol/L — ABNORMAL LOW (ref 135–145)
Total Bilirubin: 1.4 mg/dL — ABNORMAL HIGH (ref 0.3–1.2)
Total Protein: 5.3 g/dL — ABNORMAL LOW (ref 6.5–8.1)

## 2018-10-31 LAB — CBC
HCT: 29 % — ABNORMAL LOW (ref 36.0–46.0)
Hemoglobin: 8.8 g/dL — ABNORMAL LOW (ref 12.0–15.0)
MCH: 31 pg (ref 26.0–34.0)
MCHC: 30.3 g/dL (ref 30.0–36.0)
MCV: 102.1 fL — ABNORMAL HIGH (ref 80.0–100.0)
Platelets: 270 10*3/uL (ref 150–400)
RBC: 2.84 MIL/uL — ABNORMAL LOW (ref 3.87–5.11)
RDW: 18.6 % — ABNORMAL HIGH (ref 11.5–15.5)
WBC: 3.8 10*3/uL — ABNORMAL LOW (ref 4.0–10.5)
nRBC: 20 % — ABNORMAL HIGH (ref 0.0–0.2)

## 2018-10-31 LAB — LIPASE, BLOOD: Lipase: 12 U/L (ref 11–51)

## 2018-10-31 MED ORDER — MAGNESIUM CITRATE PO SOLN
1.0000 | Freq: Once | ORAL | Status: AC
  Administered 2018-10-31: 1 via ORAL
  Filled 2018-10-31: qty 296

## 2018-10-31 MED ORDER — SODIUM CHLORIDE 0.9% FLUSH: 3.0000 mL | Freq: Once | INTRAVENOUS | Status: DC

## 2018-10-31 MED ORDER — ONDANSETRON HCL 4 MG/2ML IJ SOLN: 4.0000 mg | Freq: Once | INTRAMUSCULAR | Status: DC | PRN

## 2018-10-31 MED ORDER — HEPARIN SOD (PORK) LOCK FLUSH 100 UNIT/ML IV SOLN
500.0000 [IU] | Freq: Once | INTRAVENOUS | Status: AC
  Administered 2018-10-31: 500 [IU]
  Filled 2018-10-31: qty 5

## 2018-10-31 MED ORDER — LIDOCAINE HCL URETHRAL/MUCOSAL 2 % EX GEL
1.0000 "application " | Freq: Once | CUTANEOUS | Status: AC
  Administered 2018-10-31: 1 via TOPICAL
  Filled 2018-10-31: qty 5

## 2018-10-31 NOTE — Telephone Encounter (Signed)
Scheduled appt per 10/16 los.  Left a detailed VM of the appt date and time.

## 2018-10-31 NOTE — ED Notes (Signed)
Very little result following enema

## 2018-10-31 NOTE — ED Triage Notes (Signed)
Pt arriving via EMS for complaint of lower abdominal pain x1 week with diarrhea. Pt has hx of liver and pancreatic cancer. Pt reports cancer is aggressive and non curable but is being followed by cancer center. Pt A&O x4 upon arrival.

## 2018-10-31 NOTE — ED Notes (Signed)
[facility] contacted for transport home.  

## 2018-10-31 NOTE — ED Provider Notes (Signed)
[facility] DEPT Provider Note   CSN: [id number] Arrival date & time: 10/31/18  0008     History   Chief Complaint Chief Complaint  Patient presents with  . Abdominal Pain    HPI Taylor Oconnor is a 68 y.o. female.     The history is provided by the patient and medical records.    68 y.o. F with hx of anxiety, seasonal allergies, GERD< HLP, HTN, hx of pancreatitis cancer with liver mets currently on chemotherapy, presenting to the ED for abdominal pain.  States states she feels very constipated and like she may have a fecal impaction.  She has the urge to defecate but unable to go.  There is some fullness in the rectum.  States she has not had a solid bowel movement in about 2 weeks.  She only takes oxycodone once at night to help her relax for sleep.  She has been taking Senokot twice a day.  She reports some loose liquid bowel movement but no formed stool.  She has not had any vomiting.  Her appetite is poor but has been since starting her new round of chemotherapy.  She denies any fevers.  Past Medical History:  Diagnosis Date  . Allergy    SEASONAL  . Anxiety   . At risk for difficult insertion of breathing tube   . Breast cancer of upper-outer quadrant of right female breast ([place]) 12/03/2014  . Common bile duct (CBD) obstruction   . 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 Family history of breast cancer   . GERD (gastroesophageal reflux disease)    hx - resolved with lap band surgery  . H/O hiatal hernia    resolved with band surgery  . H/O laparoscopic adjustable gastric banding 2010  . History of kidney stones 02/2018  . Hyperlipidemia   . Hypertension   . Hypokalemia   . Insomnia    tx with zanax  . Obesity   . Pancreatic mass   . PCOS (polycystic ovarian syndrome)   . Personal history of radiation therapy   . 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 Type II or unspecified type diabetes mellitus without mention  of complication, not stated as uncontrolled 12/14/2012   no meds, diet controlled  . Wears contact lenses   . Wears glasses     Patient Active Problem List   Diagnosis Date Noted  . Portal vein thrombosis 10/17/2018  . Port-A-Cath in place 08/02/2018  . Goals of care, counseling/discussion 07/26/2018  . Family history of breast cancer   . Pancreatic cancer ([place]) 07/14/2018  . Anxiety 12/01/2016  . Prediabetes 11/30/2016  . Malignant neoplasm of upper-outer quadrant of right breast in female, estrogen receptor positive ([place]) 12/03/2014  . Essential hypertension 12/14/2012  . Hyperlipidemia, mixed 12/14/2012  . Vitamin D deficiency 12/14/2012  . Morbid obesity ([place]) 12/14/2012  . Lapband APS + [facility] repair Nov 2008 08/26/2012    Past Surgical History:  Procedure Laterality Date  . BACK SURGERY     cervical  . BILIARY STENT PLACEMENT N/A 07/01/2018   Procedure: BILIARY STENT PLACEMENT;  Surgeon: Ladene Artist, MD;  Location: [facility];  Service: Endoscopy;  Laterality: N/A;  cbd brushing  . BREAST BIOPSY    . BREAST LUMPECTOMY Right 2016  . BREAST LUMPECTOMY WITH NEEDLE LOCALIZATION AND AXILLARY SENTINEL LYMPH NODE BX Right 12/28/2014   Procedure: RIGHT BREAST LUMPECTOMY WITH TWO (2) NEEDLE LOCALIZATION AND AXILLARY SENTINEL LYMPH  NODE BX;  Surgeon: Alphonsa Overall, MD;  Location: [facility];  Service: General;  Laterality: Right;  . CESAREAN SECTION     x 1  . CHOLECYSTECTOMY    . COLONOSCOPY  09/29/2004   normal  . CYSTOSCOPY WITH RETROGRADE PYELOGRAM, URETEROSCOPY AND STENT PLACEMENT Right 03/24/2018   Procedure: CYSTOSCOPY WITH RETROGRADE PYELOGRAM, URETEROSCOPY AND STENT PLACEMENT;  Surgeon: Franchot Gallo, MD;  Location: [facility];  Service: Urology;  Laterality: Right;  1 HR  . DIAGNOSTIC LAPAROSCOPY    . ERCP N/A 07/01/2018   Procedure: ENDOSCOPIC RETROGRADE CHOLANGIOPANCREATOGRAPHY (ERCP);  Surgeon: Ladene Artist, MD;  Location: [facility];  Service: Endoscopy;  Laterality: N/A;  . ESOPHAGOGASTRODUODENOSCOPY (EGD) WITH PROPOFOL N/A 07/14/2018   Procedure: ESOPHAGOGASTRODUODENOSCOPY (EGD) WITH PROPOFOL;  Surgeon: Milus Banister, MD;  Location: [facility];  Service: Endoscopy;  Laterality: N/A;  . EUS N/A 07/14/2018   Procedure: UPPER ENDOSCOPIC ULTRASOUND (EUS) RADIAL;  Surgeon: Milus Banister, MD;  Location: [facility];  Service: Endoscopy;  Laterality: N/A;  . FINE NEEDLE ASPIRATION  07/14/2018   Procedure: FINE NEEDLE ASPIRATION (FNA) RADIAL;  Surgeon: Milus Banister, MD;  Location: [facility];  Service: Endoscopy;;  . HERNIA REPAIR  2008  . IR IMAGING GUIDED PORT INSERTION  07/20/2018  . LAPAROSCOPIC APPENDECTOMY N/A 08/06/2012   Procedure: APPENDECTOMY LAPAROSCOPIC;  Surgeon: Earnstine Regal, MD;  Location: [facility];  Service: General;  Laterality: N/A;  . LAPAROSCOPIC GASTRIC BANDING WITH HIATAL HERNIA REPAIR N/A Nov 2008  . SPHINCTEROTOMY  07/01/2018   Procedure: SPHINCTEROTOMY;  Surgeon: Ladene Artist, MD;  Location: [facility];  Service: Endoscopy;;  . TUBAL LIGATION       OB History   No obstetric history on file.    Obstetric Comments  GYNECOLOGIC HISTORY:  No LMP recorded. Patient is postmenopausal. Menarche age 23, first live birth age 53. The patient is GX P1. She stopped having periods in 2002. She did not take hormone replacement. She used oral contraceptives for 11 years remot ely, with no complications.          Home Medications    Prior to Admission medications   Medication Sig Start Date End Date Taking? Authorizing Provider  anastrozole (ARIMIDEX) 1 MG tablet TAKE 1 TABLET BY MOUTH EVERY DAY Patient taking differently: Take 1 mg by mouth daily.  03/21/18   Gardenia Phlegm, NP  atenolol (TENORMIN) 100 MG tablet TAKE 1 TABLET BY MOUTH EVERY DAY Patient taking differently: Take 50 mg by mouth daily after supper.  03/19/18   Unk Pinto, MD  lidocaine (XYLOCAINE) 2 %  solution Use as directed 5 mLs in the mouth or throat every 3 (three) hours as needed for mouth pain. 08/02/18   Tanner, Lyndon Code., PA-C  magic mouthwash SOLN Take 5 mLs by mouth 4 (four) times daily as needed for mouth pain. 10/28/18   Truitt Merle, MD  nystatin (MYCOSTATIN) 100000 UNIT/ML suspension TAKE 5 ML BY MOUTH THREE TIMES DAILY AS DIRECTED 10/02/18   Truitt Merle, MD  ondansetron (ZOFRAN ODT) 8 MG disintegrating tablet Take 1 tablet (8 mg total) by mouth every 8 (eight) hours as needed for nausea or vomiting. 10/17/18   Truitt Merle, MD  oxyCODONE (OXY IR/ROXICODONE) 5 MG immediate release tablet 1 to 2 PO q 4 hours prn pain 10/24/18   Tanner, Lyndon Code., PA-C  promethazine (PHENERGAN) 25 MG tablet Take 1/2-1 tab every 6 hours as needed for nausea. 06/29/18  Liane Comber, NP  Rivaroxaban 15 & 20 MG TBPK Follow package directions: Take one 15mg  tablet by mouth twice a day. On day 22, switch to one 20mg  tablet once a day. Take with food. 10/17/18   Truitt Merle, MD  rosuvastatin (CRESTOR) 10 MG tablet Take 1 tablet Daily for Cholesterol 08/29/18   Unk Pinto, MD  traMADol (ULTRAM) 50 MG tablet Take 1 tablet (50 mg total) by mouth every 6 (six) hours as needed. 09/26/18   Alla Feeling, NP  traZODone (DESYREL) 150 MG tablet Take 1/2 to 1 tablet 1 to 2 hours before Bedtime Patient taking differently: Take 150 mg by mouth.  06/01/18   Unk Pinto, MD  verapamil (CALAN) 80 MG tablet Take 1 tablet 2 x /day with meal for BP Patient taking differently: Take 80 mg by mouth 2 (two) times daily with a meal.  02/14/18   Unk Pinto, MD  prochlorperazine (COMPAZINE) 10 MG tablet Take 1 tablet (10 mg total) by mouth every 6 (six) hours as needed (Nausea or vomiting). 09/26/18 10/17/18  Alla Feeling, NP    Family History Family History  Problem Relation Age of Onset  . Cirrhosis Mother   . Alcohol abuse Mother   . Liver disease Mother   . Diabetes Father   . Hypertension Father   . Stroke Father   .  Heart disease Father   . AAA (abdominal aortic aneurysm) Sister 69       twin sister  . Cancer Cousin        Breast  . Breast cancer Cousin        x3  . Colon cancer Neg Hx   . Rectal cancer Neg Hx   . Stomach cancer Neg Hx     Social History Social History   Tobacco Use  . Smoking status: Never Smoker  . Smokeless tobacco: Never Used  Substance Use Topics  . Alcohol use: No    Alcohol/week: 0.0 standard drinks  . Drug use: No     Allergies   Oxaliplatin, Codeine, Lipitor [atorvastatin], and Ace inhibitors   Review of Systems Review of Systems  Gastrointestinal: Positive for abdominal pain and constipation.  All other systems reviewed and are negative.    Physical Exam Updated Vital Signs Ht 5\' 3"  (1.6 m)   Wt 77.4 kg   BMI 30.23 kg/m   Physical Exam Vitals signs and nursing note reviewed.  Constitutional:      Appearance: She is well-developed.  HENT:     Head: Normocephalic and atraumatic.  Eyes:     Conjunctiva/sclera: Conjunctivae normal.     Pupils: Pupils are equal, round, and reactive to light.  Neck:     Musculoskeletal: Normal range of motion.  Cardiovascular:     Rate and Rhythm: Normal rate and regular rhythm.     Heart sounds: Normal heart sounds.  Pulmonary:     Effort: Pulmonary effort is normal.     Breath sounds: Normal breath sounds.  Abdominal:     General: Bowel sounds are normal.     Palpations: Abdomen is soft.     Tenderness: There is no abdominal tenderness.  Genitourinary:    Comments: Exam chaperoned by RN Fecal impaction noted, no bleeding, some loose stool oozing Musculoskeletal: Normal range of motion.  Skin:    General: Skin is warm and dry.  Neurological:     Mental Status: She is alert and oriented to person, place, and time.  ED Treatments / Results  Labs (all labs ordered are listed, but only abnormal results are displayed) Labs Reviewed  COMPREHENSIVE METABOLIC PANEL - Abnormal; Notable for the  following components:      Result Value   Sodium 133 (*)    Glucose, Bld 144 (*)    Calcium 8.3 (*)    Total Protein 5.3 (*)    Albumin 2.5 (*)    AST 66 (*)    ALT 47 (*)    Alkaline Phosphatase 438 (*)    Total Bilirubin 1.4 (*)    All other components within normal limits  CBC - Abnormal; Notable for the following components:   WBC 3.8 (*)    RBC 2.84 (*)    Hemoglobin 8.8 (*)    HCT 29.0 (*)    MCV 102.1 (*)    RDW 18.6 (*)    nRBC 20.0 (*)    All other components within normal limits  LIPASE, BLOOD  PATHOLOGIST SMEAR REVIEW    EKG None  Radiology No results found.  Procedures Fecal disimpaction  Date/Time: 10/31/2018 1:21 AM Performed by: Larene Pickett, PA-C Authorized by: Larene Pickett, PA-C  Consent: Verbal consent obtained. Risks and benefits: risks, benefits and alternatives were discussed Consent given by: patient Patient understanding: patient states understanding of the procedure being performed Required items: required blood products, implants, devices, and special equipment available Patient identity confirmed: verbally with patient Time out: Immediately prior to procedure a "time out" was called to verify the correct patient, procedure, equipment, support staff and site/side marked as required. Preparation: Patient was prepped and draped in the usual sterile fashion. Local anesthesia used: no  Anesthesia: Local anesthesia used: no  Sedation: Patient sedated: no  Patient tolerance: patient tolerated the procedure well with no immediate complications    (including critical care time)  Medications Ordered in ED Medications  sodium chloride flush (NS) 0.9 % injection 3 mL (has no administration in time range)  ondansetron (ZOFRAN) injection 4 mg (has no administration in time range)  lidocaine (XYLOCAINE) 2 % jelly 1 application (has no administration in time range)  heparin lock flush 100 unit/mL (has no administration in time range)   magnesium citrate solution 1 Bottle (1 Bottle Oral Given 10/31/18 0335)     Initial Impression / Assessment and Plan / ED Course  I have reviewed the triage vital signs and the nursing notes.  Pertinent labs & imaging results that were available during my care of the patient were reviewed by me and considered in my medical decision making (see chart for details).  68 year old female who with abdominal pain.  She has history of metastatic pancreatic cancer, currently under chemotherapy.  States she mostly feels constipated as she has not had an adequate bowel movement about 2 weeks.  She has not had any vomiting or other obstructive type symptoms.  States her appetite has been poor, she does not feel hungry but this is been ongoing since starting chemo.  She is afebrile and nontoxic in appearance here.  Will obtain basic labs and rectal exam.  1:20 AM Patient was manually disimpacted, now on bedpan trying for further BM.  1:30 AM Patient without further BM. Does not want further manual disimpaction but willing to try enema.  Labs reviewed and appear consistent with prior values.  2:30 AM Patient without adequate BM from enema.  Wants to try magnesium citrate. I have discussed with her she may not had full bowel clean out today, may  take a few doses of other medications before full BM.  She would still like to try further meds here.  5:46 AM Patient only with small BM after enema and mag citrate.  Patient is so uncomfortable so did allow further disimpaction and large amount of formed stool retrieved, remainder in the rectum felt soft. No bleeding noted afterwards.  Patient passing flatus, states she feels more comfortable at this time.    Feel she is stable for discharge home with continued symptomatic management.  Encourage lots of oral fluids, may need to switch to MiraLAX twice daily until bowel movements regulate, then can switch back to senna if desired.  Close follow-up with PCP.  Return  here for any new/acute changes.  Final Clinical Impressions(s) / ED Diagnoses   Final diagnoses:  Fecal impaction [facility])  Generalized abdominal pain    ED Discharge Orders    None       Larene Pickett, PA-C 10/31/18 [id number]    Merrily Pew, MD 10/31/18 [phone]

## 2018-10-31 NOTE — Discharge Instructions (Signed)
Make sure you are drinking lots of liquids, try to eat fiber (leafy greens, etc). Recommend to switch to miralax twice daily-- 1 capfull in 80z of water.  I would do this until BM's regulate, then can switch back to your senna if desired. Follow-up with your primary care doctor. Return here for any new/acute changes.

## 2018-11-01 ENCOUNTER — Encounter (HOSPITAL_COMMUNITY): Payer: Self-pay

## 2018-11-01 ENCOUNTER — Other Ambulatory Visit: Payer: Self-pay

## 2018-11-01 ENCOUNTER — Telehealth: Payer: Self-pay

## 2018-11-01 ENCOUNTER — Inpatient Hospital Stay (HOSPITAL_COMMUNITY)
Admission: EM | Admit: 2018-11-01 | Discharge: 2018-11-07 | DRG: 640 | Disposition: A | Discharge status: Hospice | Payer: Medicare Other | Attending: Internal Medicine | Admitting: Internal Medicine

## 2018-11-01 DIAGNOSIS — R52 Pain, unspecified: Secondary | ICD-10-CM | POA: Diagnosis present

## 2018-11-01 DIAGNOSIS — R630 Anorexia: Secondary | ICD-10-CM | POA: Diagnosis present

## 2018-11-01 DIAGNOSIS — C259 Malignant neoplasm of pancreas, unspecified: Secondary | ICD-10-CM | POA: Diagnosis not present

## 2018-11-01 DIAGNOSIS — Z833 Family history of diabetes mellitus: Secondary | ICD-10-CM

## 2018-11-01 DIAGNOSIS — R197 Diarrhea, unspecified: Secondary | ICD-10-CM | POA: Diagnosis present

## 2018-11-01 DIAGNOSIS — Z79899 Other long term (current) drug therapy: Secondary | ICD-10-CM

## 2018-11-01 DIAGNOSIS — C799 Secondary malignant neoplasm of unspecified site: Secondary | ICD-10-CM

## 2018-11-01 DIAGNOSIS — R531 Weakness: Secondary | ICD-10-CM | POA: Diagnosis not present

## 2018-11-01 DIAGNOSIS — Z79891 Long term (current) use of opiate analgesic: Secondary | ICD-10-CM

## 2018-11-01 DIAGNOSIS — Z811 Family history of alcohol abuse and dependence: Secondary | ICD-10-CM

## 2018-11-01 DIAGNOSIS — K219 Gastro-esophageal reflux disease without esophagitis: Secondary | ICD-10-CM | POA: Diagnosis present

## 2018-11-01 DIAGNOSIS — N390 Urinary tract infection, site not specified: Secondary | ICD-10-CM | POA: Diagnosis present

## 2018-11-01 DIAGNOSIS — K5641 Fecal impaction: Secondary | ICD-10-CM | POA: Diagnosis present

## 2018-11-01 DIAGNOSIS — I81 Portal vein thrombosis: Secondary | ICD-10-CM | POA: Diagnosis present

## 2018-11-01 DIAGNOSIS — D649 Anemia, unspecified: Secondary | ICD-10-CM | POA: Diagnosis present

## 2018-11-01 DIAGNOSIS — I10 Essential (primary) hypertension: Secondary | ICD-10-CM | POA: Diagnosis not present

## 2018-11-01 DIAGNOSIS — Z6831 Body mass index (BMI) 31.0-31.9, adult: Secondary | ICD-10-CM

## 2018-11-01 DIAGNOSIS — G4700 Insomnia, unspecified: Secondary | ICD-10-CM | POA: Diagnosis present

## 2018-11-01 DIAGNOSIS — R627 Adult failure to thrive: Principal | ICD-10-CM | POA: Diagnosis present

## 2018-11-01 DIAGNOSIS — Z823 Family history of stroke: Secondary | ICD-10-CM

## 2018-11-01 DIAGNOSIS — G893 Neoplasm related pain (acute) (chronic): Secondary | ICD-10-CM | POA: Diagnosis present

## 2018-11-01 DIAGNOSIS — Z9049 Acquired absence of other specified parts of digestive tract: Secondary | ICD-10-CM

## 2018-11-01 DIAGNOSIS — Z66 Do not resuscitate: Secondary | ICD-10-CM | POA: Diagnosis present

## 2018-11-01 DIAGNOSIS — C787 Secondary malignant neoplasm of liver and intrahepatic bile duct: Secondary | ICD-10-CM | POA: Diagnosis present

## 2018-11-01 DIAGNOSIS — Z515 Encounter for palliative care: Secondary | ICD-10-CM | POA: Diagnosis present

## 2018-11-01 DIAGNOSIS — Z853 Personal history of malignant neoplasm of breast: Secondary | ICD-10-CM

## 2018-11-01 DIAGNOSIS — Z8507 Personal history of malignant neoplasm of pancreas: Secondary | ICD-10-CM

## 2018-11-01 DIAGNOSIS — B9689 Other specified bacterial agents as the cause of diseases classified elsewhere: Secondary | ICD-10-CM | POA: Diagnosis present

## 2018-11-01 DIAGNOSIS — E785 Hyperlipidemia, unspecified: Secondary | ICD-10-CM | POA: Diagnosis present

## 2018-11-01 DIAGNOSIS — E861 Hypovolemia: Secondary | ICD-10-CM | POA: Diagnosis present

## 2018-11-01 DIAGNOSIS — Z79811 Long term (current) use of aromatase inhibitors: Secondary | ICD-10-CM

## 2018-11-01 DIAGNOSIS — Z20828 Contact with and (suspected) exposure to other viral communicable diseases: Secondary | ICD-10-CM | POA: Diagnosis present

## 2018-11-01 DIAGNOSIS — E119 Type 2 diabetes mellitus without complications: Secondary | ICD-10-CM | POA: Diagnosis present

## 2018-11-01 DIAGNOSIS — Z803 Family history of malignant neoplasm of breast: Secondary | ICD-10-CM

## 2018-11-01 DIAGNOSIS — E871 Hypo-osmolality and hyponatremia: Secondary | ICD-10-CM | POA: Diagnosis present

## 2018-11-01 DIAGNOSIS — R748 Abnormal levels of other serum enzymes: Secondary | ICD-10-CM | POA: Diagnosis present

## 2018-11-01 DIAGNOSIS — Z8249 Family history of ischemic heart disease and other diseases of the circulatory system: Secondary | ICD-10-CM

## 2018-11-01 LAB — DIFFERENTIAL
Abs Immature Granulocytes: 0.39 10*3/uL — ABNORMAL HIGH (ref 0.00–0.07)
Basophils Absolute: 0 10*3/uL (ref 0.0–0.1)
Basophils Relative: 1 %
Eosinophils Absolute: 0 10*3/uL (ref 0.0–0.5)
Eosinophils Relative: 0 %
Immature Granulocytes: 6 %
Lymphocytes Relative: 20 %
Lymphs Abs: 1.4 10*3/uL (ref 0.7–4.0)
Monocytes Absolute: 2.3 10*3/uL — ABNORMAL HIGH (ref 0.1–1.0)
Monocytes Relative: 34 %
Neutro Abs: 2.7 10*3/uL (ref 1.7–7.7)
Neutrophils Relative %: 39 %

## 2018-11-01 LAB — BASIC METABOLIC PANEL
Anion gap: 9 (ref 5–15)
BUN: 24 mg/dL — ABNORMAL HIGH (ref 8–23)
CO2: 24 mmol/L (ref 22–32)
Calcium: 8.1 mg/dL — ABNORMAL LOW (ref 8.9–10.3)
Chloride: 98 mmol/L (ref 98–111)
Creatinine, Ser: 0.67 mg/dL (ref 0.44–1.00)
GFR calc Af Amer: >60 mL/min (ref >60)
GFR calc non Af Amer: >60 mL/min (ref >60)
Glucose, Bld: 107 mg/dL — ABNORMAL HIGH (ref 70–99)
Potassium: 4 mmol/L (ref 3.5–5.1)
Sodium: 131 mmol/L — ABNORMAL LOW (ref 135–145)

## 2018-11-01 LAB — CBC
HCT: 27.7 % — ABNORMAL LOW (ref 36.0–46.0)
Hemoglobin: 8.5 g/dL — ABNORMAL LOW (ref 12.0–15.0)
MCH: 31 pg (ref 26.0–34.0)
MCHC: 30.7 g/dL (ref 30.0–36.0)
MCV: 101.1 fL — ABNORMAL HIGH (ref 80.0–100.0)
Platelets: 392 10*3/uL (ref 150–400)
RBC: 2.74 MIL/uL — ABNORMAL LOW (ref 3.87–5.11)
RDW: 19.1 % — ABNORMAL HIGH (ref 11.5–15.5)
WBC: 6.9 10*3/uL (ref 4.0–10.5)
nRBC: 7.8 % — ABNORMAL HIGH (ref 0.0–0.2)

## 2018-11-01 LAB — TSH: TSH: 6.049 u[IU]/mL — ABNORMAL HIGH (ref 0.350–4.500)

## 2018-11-01 LAB — C DIFFICILE QUICK SCREEN W PCR REFLEX
C Diff antigen: NEGATIVE
C Diff interpretation: NOT DETECTED
C Diff toxin: NEGATIVE

## 2018-11-01 LAB — HEPATIC FUNCTION PANEL
ALT: 48 U/L — ABNORMAL HIGH (ref 0–44)
AST: 78 U/L — ABNORMAL HIGH (ref 15–41)
Albumin: 2.2 g/dL — ABNORMAL LOW (ref 3.5–5.0)
Alkaline Phosphatase: 446 U/L — ABNORMAL HIGH (ref 38–126)
Bilirubin, Direct: 0.5 mg/dL — ABNORMAL HIGH (ref 0.0–0.2)
Indirect Bilirubin: 0.9 mg/dL (ref 0.3–0.9)
Total Bilirubin: 1.4 mg/dL — ABNORMAL HIGH (ref 0.3–1.2)
Total Protein: 4.9 g/dL — ABNORMAL LOW (ref 6.5–8.1)

## 2018-11-01 LAB — CBG MONITORING, ED: Glucose-Capillary: 93 mg/dL (ref 70–99)

## 2018-11-01 LAB — LACTIC ACID, PLASMA: Lactic Acid, Venous: 1.8 mmol/L (ref 0.5–1.9)

## 2018-11-01 LAB — SARS CORONAVIRUS 2 (TAT 6-24 HRS): SARS Coronavirus 2: NEGATIVE

## 2018-11-01 LAB — PHOSPHORUS: Phosphorus: 3.2 mg/dL (ref 2.5–4.6)

## 2018-11-01 LAB — MAGNESIUM: Magnesium: 2.7 mg/dL — ABNORMAL HIGH (ref 1.7–2.4)

## 2018-11-01 LAB — PROTIME-INR
INR: 3.6 — ABNORMAL HIGH (ref 0.8–1.2)
Prothrombin Time: 35.5 seconds — ABNORMAL HIGH (ref 11.4–15.2)

## 2018-11-01 LAB — LIPASE, BLOOD: Lipase: 12 U/L (ref 11–51)

## 2018-11-01 MED ORDER — ONDANSETRON HCL 4 MG PO TABS: 4.0000 mg | ORAL_TABLET | Freq: Four times a day (QID) | ORAL | Status: DC | PRN

## 2018-11-01 MED ORDER — TRAZODONE HCL 50 MG PO TABS
150.0000 mg | ORAL_TABLET | Freq: Every day | ORAL | Status: DC
  Administered 2018-11-01 – 2018-11-04 (×4): 150 mg via ORAL
  Filled 2018-11-01 (×5): qty 3

## 2018-11-01 MED ORDER — ONDANSETRON HCL 4 MG/2ML IJ SOLN
4.0000 mg | Freq: Once | INTRAMUSCULAR | Status: AC
  Administered 2018-11-01: 4 mg via INTRAVENOUS
  Filled 2018-11-01: qty 2

## 2018-11-01 MED ORDER — ANASTROZOLE 1 MG PO TABS
1.0000 mg | ORAL_TABLET | Freq: Every day | ORAL | Status: DC
  Administered 2018-11-02 – 2018-11-04 (×3): 1 mg via ORAL
  Filled 2018-11-01 (×3): qty 1

## 2018-11-01 MED ORDER — TRAMADOL HCL 50 MG PO TABS
50.0000 mg | ORAL_TABLET | Freq: Four times a day (QID) | ORAL | Status: DC | PRN
  Administered 2018-11-01: 50 mg via ORAL
  Filled 2018-11-01: qty 1

## 2018-11-01 MED ORDER — SODIUM CHLORIDE 0.9 % IV BOLUS
500.0000 mL | Freq: Once | INTRAVENOUS | Status: AC
  Administered 2018-11-01: 500 mL via INTRAVENOUS

## 2018-11-01 MED ORDER — CHLORHEXIDINE GLUCONATE CLOTH 2 % EX PADS
6.0000 | MEDICATED_PAD | Freq: Every day | CUTANEOUS | Status: DC
  Administered 2018-11-02 – 2018-11-04 (×3): 6 via TOPICAL

## 2018-11-01 MED ORDER — ROSUVASTATIN CALCIUM 10 MG PO TABS
10.0000 mg | ORAL_TABLET | Freq: Every day | ORAL | Status: DC
  Administered 2018-11-02 – 2018-11-04 (×3): 10 mg via ORAL
  Filled 2018-11-01 (×3): qty 1

## 2018-11-01 MED ORDER — DOCUSATE SODIUM 100 MG PO CAPS
100.0000 mg | ORAL_CAPSULE | Freq: Two times a day (BID) | ORAL | Status: DC
  Administered 2018-11-01 – 2018-11-07 (×11): 100 mg via ORAL
  Filled 2018-11-01 (×11): qty 1

## 2018-11-01 MED ORDER — PROCHLORPERAZINE MALEATE 10 MG PO TABS: 10.0000 mg | ORAL_TABLET | Freq: Four times a day (QID) | ORAL | Status: DC | PRN

## 2018-11-01 MED ORDER — ATENOLOL 50 MG PO TABS
50.0000 mg | ORAL_TABLET | Freq: Every day | ORAL | Status: DC
  Administered 2018-11-02 – 2018-11-06 (×4): 50 mg via ORAL
  Filled 2018-11-01 (×4): qty 1

## 2018-11-01 MED ORDER — MORPHINE SULFATE (PF) 4 MG/ML IV SOLN
4.0000 mg | Freq: Once | INTRAVENOUS | Status: AC
  Administered 2018-11-01: 4 mg via INTRAVENOUS
  Filled 2018-11-01: qty 1

## 2018-11-01 MED ORDER — SODIUM CHLORIDE 0.9% FLUSH
3.0000 mL | Freq: Once | INTRAVENOUS | Status: AC
  Administered 2018-11-01: 3 mL via INTRAVENOUS

## 2018-11-01 MED ORDER — POLYETHYLENE GLYCOL 3350 17 G PO PACK
17.0000 g | PACK | Freq: Every day | ORAL | Status: DC
  Administered 2018-11-01 – 2018-11-07 (×5): 17 g via ORAL
  Filled 2018-11-01 (×7): qty 1

## 2018-11-01 MED ORDER — ONDANSETRON HCL 4 MG/2ML IJ SOLN: 4.0000 mg | Freq: Four times a day (QID) | INTRAMUSCULAR | Status: DC | PRN

## 2018-11-01 MED ORDER — RIVAROXABAN 15 MG PO TABS
15.0000 mg | ORAL_TABLET | Freq: Two times a day (BID) | ORAL | Status: DC
  Administered 2018-11-01 – 2018-11-07 (×12): 15 mg via ORAL
  Filled 2018-11-01 (×14): qty 1

## 2018-11-01 MED ORDER — SODIUM CHLORIDE 0.9 % IV SOLN
Freq: Once | INTRAVENOUS | Status: AC
  Administered 2018-11-01: 14:00:00 via INTRAVENOUS

## 2018-11-01 MED ORDER — OXYCODONE HCL 5 MG PO TABS
2.5000 mg | ORAL_TABLET | Freq: Four times a day (QID) | ORAL | Status: DC | PRN
  Administered 2018-11-04 – 2018-11-06 (×5): 5 mg via ORAL
  Filled 2018-11-01 (×5): qty 1

## 2018-11-01 NOTE — Progress Notes (Signed)
Received report from ED nurse.  Awaiting patient's transfer to Room 1615.

## 2018-11-01 NOTE — Telephone Encounter (Signed)
Patient left a VM stating she wanted to let Dr. Burr Medico know she went to the ED on Sunday and has felt weak ever since. Called patient back to get more info and determine if she could be evaluated in [facility] or needed to go to ED via EMS.   Patient stated she had to be taken to and from the ED via EMS on Sunday because she was too weak to get in the car with her husband's help. She stated she was extremely constipated and had to have an enema and fecal disimpaction. She stated that ever since then she has had uncontrollable liquid stools, and has had to wear Depends to protect her clothing. She said she is able to get up to the bathroom but that is the only activity she can manage because she's so weak and gets winded very easily. She reported that she can only tolerate liquids currently, and has only managed half an Ensure and some chicken broth. Denied any fever or chills.  Informed the patient that if she was not able to get to the [place] with her husband's assistance, then she needed to call EMS to take her to the ED so she could be admitted for observation, IVFs, and any other necessary interventions. Patient verbalized understanding and agreement, and stated she would call her husband with an update of plan. Will make Dr. Burr Medico aware and continue to support as necessary.

## 2018-11-01 NOTE — ED Triage Notes (Signed)
Pt arrived via EMS from home, pt c/o generalized weakness, x a few days. Pt has HX of Pancreatic CA being seen . Pt does reports that she was given oxycodone and has been having increased weakness since, per EMS. Pt is alert and oriented x 4 and is verbally responsive, EMS v/s 114/48, HR 80, RR 16  CBG 126  20 G left AC adm. IVF

## 2018-11-01 NOTE — ED Notes (Signed)
ED TO INPATIENT HANDOFF REPORT  ED Nurse Name and Phone #: [phone] [place] Name/Age/Gender Taylor Oconnor 68 y.o. female Room/Bed: WA23/WA23  Code Status   Code Status: Prior  Home/SNF/Other Home Patient oriented to: self, place, time and situation Is this baseline? Yes   Triage Complete: Triage complete  Chief Complaint weakness  Triage Note Pt arrived via EMS from home, pt c/o generalized weakness, x a few days. Pt has HX of Pancreatic CA being seen . Pt does reports that she was given oxycodone and has been having increased weakness since, per EMS. Pt is alert and oriented x 4 and is verbally responsive, EMS v/s 114/48, HR 80, RR 16  CBG 126  20 G left AC adm. IVF   Allergies Allergies  Allergen Reactions  . Oxaliplatin Anaphylaxis  . Codeine Itching  . Lipitor [Atorvastatin] Other (See Comments)    Severe fatigue. ? memory loss.  . Ace Inhibitors Cough    Level of Care/Admitting Diagnosis ED Disposition    ED Disposition Condition Comment   Admit  Hospital Area: [facility] [[id number]]  Level of Care: Med-Surg [16]  Covid Evaluation: Asymptomatic Screening Protocol (No Symptoms)  Diagnosis: Weakness [id number]  Admitting Physician: Mariel Aloe [phone]  Attending Physician: Cordelia Poche A [phone]  PT Class (Do Not Modify): Observation [104]  PT Acc Code (Do Not Modify): Observation [10022]       B Medical/Surgery History Past Medical History:  Diagnosis Date  . Allergy    SEASONAL  . Anxiety   . At risk for difficult insertion of breathing tube   . Breast cancer of upper-outer quadrant of right female breast ([place]) 12/03/2014  . Common bile duct (CBD) obstruction   . 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 Family history of breast cancer   . GERD (gastroesophageal reflux disease)    hx - resolved with lap band surgery  . H/O hiatal hernia    resolved with band surgery  . H/O laparoscopic adjustable gastric banding  2010  . History of kidney stones 02/2018  . Hyperlipidemia   . Hypertension   . Hypokalemia   . Insomnia    tx with zanax  . Obesity   . Pancreatic mass   . PCOS (polycystic ovarian syndrome)   . Personal history of radiation therapy   . 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 Type II or unspecified type diabetes mellitus without mention of complication, not stated as uncontrolled 12/14/2012   no meds, diet controlled  . Wears contact lenses   . Wears glasses    Past Surgical History:  Procedure Laterality Date  . BACK SURGERY     cervical  . BILIARY STENT PLACEMENT N/A 07/01/2018   Procedure: BILIARY STENT PLACEMENT;  Surgeon: Ladene Artist, MD;  Location: [facility];  Service: Endoscopy;  Laterality: N/A;  cbd brushing  . BREAST BIOPSY    . BREAST LUMPECTOMY Right 2016  . BREAST LUMPECTOMY WITH NEEDLE LOCALIZATION AND AXILLARY SENTINEL LYMPH NODE BX Right 12/28/2014   Procedure: RIGHT BREAST LUMPECTOMY WITH TWO (2) NEEDLE LOCALIZATION AND AXILLARY SENTINEL LYMPH NODE BX;  Surgeon: Alphonsa Overall, MD;  Location: [facility];  Service: General;  Laterality: Right;  . CESAREAN SECTION     x 1  . CHOLECYSTECTOMY    . COLONOSCOPY  09/29/2004   normal  . CYSTOSCOPY WITH RETROGRADE PYELOGRAM, URETEROSCOPY AND STENT PLACEMENT Right 03/24/2018   Procedure: CYSTOSCOPY WITH RETROGRADE  PYELOGRAM, URETEROSCOPY AND STENT PLACEMENT;  Surgeon: Franchot Gallo, MD;  Location: [facility];  Service: Urology;  Laterality: Right;  1 HR  . DIAGNOSTIC LAPAROSCOPY    . ERCP N/A 07/01/2018   Procedure: ENDOSCOPIC RETROGRADE CHOLANGIOPANCREATOGRAPHY (ERCP);  Surgeon: Ladene Artist, MD;  Location: [facility];  Service: Endoscopy;  Laterality: N/A;  . ESOPHAGOGASTRODUODENOSCOPY (EGD) WITH PROPOFOL N/A 07/14/2018   Procedure: ESOPHAGOGASTRODUODENOSCOPY (EGD) WITH PROPOFOL;  Surgeon: Milus Banister, MD;  [facility];  Service: Endoscopy;   Laterality: N/A;  . EUS N/A 07/14/2018   Procedure: UPPER ENDOSCOPIC ULTRASOUND (EUS) RADIAL;  Surgeon: Milus Banister, MD;  [facility];  Service: Endoscopy;  Laterality: N/A;  . FINE NEEDLE ASPIRATION  07/14/2018   Procedure: FINE NEEDLE ASPIRATION (FNA) RADIAL;  Surgeon: Milus Banister, MD;  Location: [facility];  Service: Endoscopy;;  . HERNIA REPAIR  2008  . IR IMAGING GUIDED PORT INSERTION  07/20/2018  . LAPAROSCOPIC APPENDECTOMY N/A 08/06/2012   Procedure: APPENDECTOMY LAPAROSCOPIC;  Surgeon: Earnstine Regal, MD;  [facility];  Service: General;  Laterality: N/A;  . LAPAROSCOPIC GASTRIC BANDING WITH HIATAL HERNIA REPAIR N/A Nov 2008  . SPHINCTEROTOMY  07/01/2018   Procedure: SPHINCTEROTOMY;  Surgeon: Ladene Artist, MD;  Location: [facility];  Service: Endoscopy;;  . TUBAL LIGATION       A IV Location/Drains/Wounds Patient Lines/Drains/Airways Status   Active Line/Drains/Airways    Name:   Placement date:   Placement time:   Site:   Days:   Implanted Port 07/20/18 Right Chest   07/20/18    1326    Chest   104   Peripheral IV 11/01/18 Left Antecubital   11/01/18    1106    Antecubital   less than 1   GI Stent 10 Fr.   07/01/18    1140    -   123          Intake/Output Last 24 hours No intake or output data in the 24 hours ending 11/01/18 1844  Labs/Imaging Results for orders placed or performed during the hospital encounter of 11/01/18 (from the past 48 hour(s))  Basic metabolic panel     Status: Abnormal   Collection Time: 11/01/18 11:05 AM  Result Value Ref Range   Sodium 131 (L) 135 - 145 mmol/L   Potassium 4.0 3.5 - 5.1 mmol/L   Chloride 98 98 - 111 mmol/L   CO2 24 22 - 32 mmol/L   Glucose, Bld 107 (H) 70 - 99 mg/dL   BUN 24 (H) 8 - 23 mg/dL   Creatinine, Ser 0.67 0.44 - 1.00 mg/dL   Calcium 8.1 (L) 8.9 - 10.3 mg/dL   GFR calc non Af Amer >60 >60 mL/min   GFR calc Af Amer >60 >60 mL/min   Anion gap 9 5 - 15    Comment: Performed at [facility], [place] [address] [place], [place] [address]  CBC     Status: Abnormal   Collection Time: 11/01/18 11:05 AM  Result Value Ref Range   WBC 6.9 4.0 - 10.5 K/uL   RBC 2.74 (L) 3.87 - 5.11 MIL/uL   Hemoglobin 8.5 (L) 12.0 - 15.0 g/dL   HCT 27.7 (L) 36.0 - 46.0 %   MCV 101.1 (H) 80.0 - 100.0 fL   MCH 31.0 26.0 - 34.0 pg   MCHC 30.7 30.0 - 36.0 g/dL   RDW 19.1 (H) 11.5 - 15.5 %   Platelets 392  150 - 400 K/uL   nRBC 7.8 (H) 0.0 - 0.2 %    Comment: Performed at [facility], [place] [address] [place], [place] [address]  Hepatic function panel     Status: Abnormal   Collection Time: 11/01/18 11:05 AM  Result Value Ref Range   Total Protein 4.9 (L) 6.5 - 8.1 g/dL   Albumin 2.2 (L) 3.5 - 5.0 g/dL   AST 78 (H) 15 - 41 U/L   ALT 48 (H) 0 - 44 U/L   Alkaline Phosphatase 446 (H) 38 - 126 U/L   Total Bilirubin 1.4 (H) 0.3 - 1.2 mg/dL   Bilirubin, Direct 0.5 (H) 0.0 - 0.2 mg/dL   Indirect Bilirubin 0.9 0.3 - 0.9 mg/dL    Comment: Performed at [facility], [place] [address], [place], [place] [address]  Lipase, blood     Status: None   Collection Time: 11/01/18 11:05 AM  Result Value Ref Range   Lipase 12 11 - 51 U/L    Comment: Performed at [facility], [place] [address], [place], [place] [address]  Protime-INR     Status: Abnormal   Collection Time: 11/01/18 11:05 AM  Result Value Ref Range   Prothrombin Time 35.5 (H) 11.4 - 15.2 seconds   INR 3.6 (H) 0.8 - 1.2    Comment: (NOTE) INR goal varies based on device and disease states. Performed at [facility], [place] [address], [place], [place] [address]   Magnesium     Status: Abnormal   Collection Time: 11/01/18 11:05 AM  Result Value Ref Range   Magnesium 2.7 (H) 1.7 - 2.4 mg/dL    Comment: Performed at [facility], [place] [address], [place], [place] [address]  Phosphorus     Status: None   Collection Time: 11/01/18 11:05 AM   Result Value Ref Range   Phosphorus 3.2 2.5 - 4.6 mg/dL    Comment: Performed at [facility], [place] [address], [place], [place] [address]  Differential     Status: Abnormal   Collection Time: 11/01/18 11:05 AM  Result Value Ref Range   Neutrophils Relative % 39 %   Neutro Abs 2.7 1.7 - 7.7 K/uL   Lymphocytes Relative 20 %   Lymphs Abs 1.4 0.7 - 4.0 K/uL   Monocytes Relative 34 %   Monocytes Absolute 2.3 (H) 0.1 - 1.0 K/uL   Eosinophils Relative 0 %   Eosinophils Absolute 0.0 0.0 - 0.5 K/uL   Basophils Relative 1 %   Basophils Absolute 0.0 0.0 - 0.1 K/uL   WBC Morphology MILD LEFT SHIFT (1-5% METAS, OCC MYELO, OCC BANDS)    Immature Granulocytes 6 %   Abs Immature Granulocytes 0.39 (H) 0.00 - 0.07 K/uL    Comment: Performed at [facility], [place] [address] [place], [place] [address]  CBG monitoring, ED     Status: None   Collection Time: 11/01/18 11:37 AM  Result Value Ref Range   Glucose-Capillary 93 70 - 99 mg/dL  Lactic acid, plasma     Status: None   Collection Time: 11/01/18  1:15 PM  Result Value Ref Range   Lactic Acid, Venous 1.8 0.5 - 1.9 mmol/L    Comment: Performed at [facility], [place] [address] [place],  [address]  TSH     Status: Abnormal   Collection Time: 11/01/18  1:15 PM  Result Value Ref Range   TSH 6.049 (H)  0.350 - 4.500 uIU/mL    Comment: Performed by a 3rd Generation assay with a functional sensitivity of <=0.01 uIU/mL. Performed at [facility], [place] [address], [place], [place] [address]   C Difficile Quick Screen w PCR reflex     Status: None   Collection Time: 11/01/18  1:15 PM   Specimen: Nasopharyngeal Swab; Stool  Result Value Ref Range   C Diff antigen NEGATIVE NEGATIVE   C Diff toxin NEGATIVE NEGATIVE   C Diff interpretation No C. difficile detected.     Comment: Performed at [facility], [place] [address], [place], [place] [address]   Culture, blood (routine x 2)     Status: None (Preliminary result)   Collection Time: 11/01/18  1:16 PM   Specimen: BLOOD  Result Value Ref Range   Specimen Description      BLOOD LEFT ANTECUBITAL Performed at [facility], [place] [address] [place], [place] [address]    Special Requests      BOTTLES DRAWN AEROBIC AND ANAEROBIC Blood Culture adequate volume Performed at [place] [address] [place], [place] [address]    Culture PENDING    Report Status PENDING    No results found.  Pending Labs [employer] (From admission, onward)    Start     Ordered   11/02/18 0500  Magnesium  Tomorrow morning,   R     11/01/18 1634   11/01/18 1202  SARS CORONAVIRUS 2 (TAT 6-24 HRS) Nasopharyngeal Nasopharyngeal Swab  (Asymptomatic/Tier 2 Patients Labs)  Once,   STAT    Question Answer Comment  Is this test for diagnosis or screening Screening   Symptomatic for COVID-19 as defined by CDC No   Hospitalized for COVID-19 No   Admitted to ICU for COVID-19 No   Previously tested for COVID-19 Yes   Resident in a congregate (group) care setting No   Employed in healthcare setting No   Pregnant No      11/01/18 1201   11/01/18 1159  GI pathogen panel by PCR, stool  (Gastrointestinal Panel by PCR, Stool                                                                                                                                                     *Does Not include CLOSTRIDIUM DIFFICILE testing.**If CDIFF testing is needed, select the C Difficile Quick Screen w PCR reflex order below)  Once,   STAT     11/01/18 1201   11/01/18 1158  Culture, blood (routine x 2)  BLOOD CULTURE X 2,   STAT     11/01/18 1201   11/01/18 1044  Urinalysis, Routine w reflex microscopic  Once,   STAT     11/01/18 1044   Signed and Held  Basic metabolic panel  Tomorrow morning,   R     Signed and Held   Signed and Held  CBC  Tomorrow morning,   R     Signed and Held           Vitals/Pain Today's Vitals   11/01/18 1040 11/01/18 1045 11/01/18 1338 11/01/18 1620  BP:   102/69 91/63  Pulse: 81 80 77 82  Resp: (!) 21 20 (!) 21 20  Temp:      SpO2: 98% 98% 99% 98%  PainSc:        Isolation Precautions Enteric precautions (UV disinfection)  Medications Medications  sodium chloride flush (NS) 0.9 % injection 3 mL (3 mLs Intravenous Given 11/01/18 1107)  sodium chloride 0.9 % bolus 500 mL (500 mLs Intravenous New Bag/Given 11/01/18 1334)  0.9 %  sodium chloride infusion ( Intravenous New Bag/Given 11/01/18 1333)  morphine 4 MG/ML injection 4 mg (4 mg Intravenous Given 11/01/18 1335)  ondansetron (ZOFRAN) injection 4 mg (4 mg Intravenous Given 11/01/18 1334)    Mobility non-ambulatory Moderate fall risk   Focused Assessments    R Recommendations: See Admitting Provider Note  Report given to:   Additional Notes:

## 2018-11-01 NOTE — ED Provider Notes (Addendum)
[facility] DEPT Provider Note   CSN: [id number] Arrival date & time: 11/01/18  1015     History   Chief Complaint Chief Complaint  Patient presents with  . generalized weakness  . Weakness    HPI Taylor Oconnor is a 68 y.o. female.     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  She is being treated by Dr. Burr Medico.  She reports that she has been having diarrhea over the last week.  She reports she is having incontinence of liquid stool.  Patient reports she also has really not build to take much in orally.  She is becoming increasingly weak.  She reports it is getting too difficult for her to even get out of her bed to go to the bathroom.  No documented fever.  She reports she has been having ongoing abdominal pain all the way from her epigastrium to her pelvis.  She reports she has been having that since the cancer metastasized to her liver.  This been going on for several months.  She got impacted from taking pain medications and had that the disimpaction several days ago.  She reports that she stopped taking the pain medications because of the severe constipation.  Now, she is having continuous diarrhea.  She did contact her oncologist office and was advised to come to the emergency department for diagnostic evaluation, initiation of fluids and admission to the hospital. Past Medical History:  Diagnosis Date  . Allergy    SEASONAL  . Anxiety   . At risk for difficult insertion of breathing tube   . Breast cancer of upper-outer quadrant of right female breast ([place]) 12/03/2014  . Common bile duct (CBD) obstruction   . 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 Family history of breast cancer   . GERD (gastroesophageal reflux disease)    hx - resolved with lap band surgery  . H/O hiatal hernia    resolved with band surgery  . H/O laparoscopic adjustable gastric banding 2010  . History of kidney stones 02/2018  . Hyperlipidemia    . Hypertension   . Hypokalemia   . Insomnia    tx with zanax  . Obesity   . Pancreatic mass   . PCOS (polycystic ovarian syndrome)   . Personal history of radiation therapy   . 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 Type II or unspecified type diabetes mellitus without mention of complication, not stated as uncontrolled 12/14/2012   no meds, diet controlled  . Wears contact lenses   . Wears glasses     Patient Active Problem List   Diagnosis Date Noted  . Portal vein thrombosis 10/17/2018  . Port-A-Cath in place 08/02/2018  . Goals of care, counseling/discussion 07/26/2018  . Family history of breast cancer   . Pancreatic cancer ([place]) 07/14/2018  . Anxiety 12/01/2016  . Prediabetes 11/30/2016  . Malignant neoplasm of upper-outer quadrant of right breast in female, estrogen receptor positive ([place]) 12/03/2014  . Essential hypertension 12/14/2012  . Hyperlipidemia, mixed 12/14/2012  . Vitamin D deficiency 12/14/2012  . Morbid obesity ([place]) 12/14/2012  . Lapband APS + [facility] repair Nov 2008 08/26/2012    Past Surgical History:  Procedure Laterality Date  . BACK SURGERY     cervical  . BILIARY STENT PLACEMENT N/A 07/01/2018   Procedure: BILIARY STENT PLACEMENT;  Surgeon: Ladene Artist, MD;  Location: [facility];  Service: Endoscopy;  Laterality: N/A;  cbd brushing  . BREAST BIOPSY    . BREAST LUMPECTOMY Right 2016  . BREAST LUMPECTOMY WITH NEEDLE LOCALIZATION AND AXILLARY SENTINEL LYMPH NODE BX Right 12/28/2014   Procedure: RIGHT BREAST LUMPECTOMY WITH TWO (2) NEEDLE LOCALIZATION AND AXILLARY SENTINEL LYMPH NODE BX;  Surgeon: Alphonsa Overall, MD;  Location: [facility];  Service: General;  Laterality: Right;  . CESAREAN SECTION     x 1  . CHOLECYSTECTOMY    . COLONOSCOPY  09/29/2004   normal  . CYSTOSCOPY WITH RETROGRADE PYELOGRAM, URETEROSCOPY AND STENT PLACEMENT Right 03/24/2018   Procedure: CYSTOSCOPY WITH RETROGRADE PYELOGRAM, URETEROSCOPY AND  STENT PLACEMENT;  Surgeon: Franchot Gallo, MD;  Location: [facility];  Service: Urology;  Laterality: Right;  1 HR  . DIAGNOSTIC LAPAROSCOPY    . ERCP N/A 07/01/2018   Procedure: ENDOSCOPIC RETROGRADE CHOLANGIOPANCREATOGRAPHY (ERCP);  Surgeon: Ladene Artist, MD;  Location: [facility];  Service: Endoscopy;  Laterality: N/A;  . ESOPHAGOGASTRODUODENOSCOPY (EGD) WITH PROPOFOL N/A 07/14/2018   Procedure: ESOPHAGOGASTRODUODENOSCOPY (EGD) WITH PROPOFOL;  Surgeon: Milus Banister, MD;  Location: [facility];  Service: Endoscopy;  Laterality: N/A;  . EUS N/A 07/14/2018   Procedure: UPPER ENDOSCOPIC ULTRASOUND (EUS) RADIAL;  Surgeon: Milus Banister, MD;  Location: [facility];  Service: Endoscopy;  Laterality: N/A;  . FINE NEEDLE ASPIRATION  07/14/2018   Procedure: FINE NEEDLE ASPIRATION (FNA) RADIAL;  Surgeon: Milus Banister, MD;  Location: [facility];  Service: Endoscopy;;  . HERNIA REPAIR  2008  . IR IMAGING GUIDED PORT INSERTION  07/20/2018  . LAPAROSCOPIC APPENDECTOMY N/A 08/06/2012   Procedure: APPENDECTOMY LAPAROSCOPIC;  Surgeon: Earnstine Regal, MD;  Location: [facility];  Service: General;  Laterality: N/A;  . LAPAROSCOPIC GASTRIC BANDING WITH HIATAL HERNIA REPAIR N/A Nov 2008  . SPHINCTEROTOMY  07/01/2018   Procedure: SPHINCTEROTOMY;  Surgeon: Ladene Artist, MD;  Location: [facility];  Service: Endoscopy;;  . TUBAL LIGATION       OB History   No obstetric history on file.    Obstetric Comments  GYNECOLOGIC HISTORY:  No LMP recorded. Patient is postmenopausal. Menarche age 38, first live birth age 80. The patient is GX P1. She stopped having periods in 2002. She did not take hormone replacement. She used oral contraceptives for 11 years remot ely, with no complications.          Home Medications    Prior to Admission medications   Medication Sig Start Date End Date Taking? Authorizing Provider  anastrozole (ARIMIDEX) 1 MG tablet TAKE 1 TABLET BY MOUTH EVERY  DAY Patient taking differently: Take 1 mg by mouth daily.  03/21/18   Gardenia Phlegm, NP  atenolol (TENORMIN) 100 MG tablet TAKE 1 TABLET BY MOUTH EVERY DAY Patient taking differently: Take 50 mg by mouth daily after supper.  03/19/18   Unk Pinto, MD  lidocaine (XYLOCAINE) 2 % solution Use as directed 5 mLs in the mouth or throat every 3 (three) hours as needed for mouth pain. 08/02/18   Tanner, Lyndon Code., PA-C  magic mouthwash SOLN Take 5 mLs by mouth 4 (four) times daily as needed for mouth pain. 10/28/18   Truitt Merle, MD  nystatin (MYCOSTATIN) 100000 UNIT/ML suspension TAKE 5 ML BY MOUTH THREE TIMES DAILY AS DIRECTED Patient taking differently: Use as directed 5 mLs in the mouth or throat 3 (three) times daily.  10/02/18   Truitt Merle, MD  ondansetron (ZOFRAN ODT) 8 MG disintegrating tablet Take 1 tablet (8 mg total)  by mouth every 8 (eight) hours as needed for nausea or vomiting. 10/17/18   Truitt Merle, MD  oxyCODONE (OXY IR/ROXICODONE) 5 MG immediate release tablet 1 to 2 PO q 4 hours prn pain Patient taking differently: Take 2.5-5 mg by mouth every 6 (six) hours as needed for moderate pain or severe pain.  10/24/18   Tanner, Lyndon Code., PA-C  prochlorperazine (COMPAZINE) 10 MG tablet Take 10 mg by mouth every 6 (six) hours as needed for nausea or vomiting.    [provider]  promethazine (PHENERGAN) 25 MG tablet Take 1/2-1 tab every 6 hours as needed for nausea. Patient taking differently: Take 25 mg by mouth every 6 (six) hours as needed for nausea or vomiting.  06/29/18   Liane Comber, NP  Rivaroxaban 15 & 20 MG TBPK Follow package directions: Take one 15mg  tablet by mouth twice a day. On day 22, switch to one 20mg  tablet once a day. Take with food. 10/17/18   Truitt Merle, MD  rosuvastatin (CRESTOR) 10 MG tablet Take 1 tablet Daily for Cholesterol Patient taking differently: Take 10 mg by mouth daily.  08/29/18   Unk Pinto, MD  traMADol (ULTRAM) 50 MG tablet Take 1 tablet (50 mg  total) by mouth every 6 (six) hours as needed. Patient not taking: Reported on 10/31/2018 09/26/18   Alla Feeling, NP  traZODone (DESYREL) 150 MG tablet Take 1/2 to 1 tablet 1 to 2 hours before Bedtime Patient taking differently: Take 150 mg by mouth.  06/01/18   Unk Pinto, MD  verapamil (CALAN) 80 MG tablet Take 1 tablet 2 x /day with meal for BP Patient taking differently: Take 80 mg by mouth 2 (two) times daily with a meal.  02/14/18   Unk Pinto, MD    Family History Family History  Problem Relation Age of Onset  . Cirrhosis Mother   . Alcohol abuse Mother   . Liver disease Mother   . Diabetes Father   . Hypertension Father   . Stroke Father   . Heart disease Father   . AAA (abdominal aortic aneurysm) Sister 44       twin sister  . Cancer Cousin        Breast  . Breast cancer Cousin        x3  . Colon cancer Neg Hx   . Rectal cancer Neg Hx   . Stomach cancer Neg Hx     Social History Social History   Tobacco Use  . Smoking status: Never Smoker  . Smokeless tobacco: Never Used  Substance Use Topics  . Alcohol use: No    Alcohol/week: 0.0 standard drinks  . Drug use: No     Allergies   Oxaliplatin, Codeine, Lipitor [atorvastatin], and Ace inhibitors   Review of Systems Review of Systems 10 Systems reviewed and are negative for acute change except as noted in the HPI.   Physical Exam Updated Vital Signs BP 102/69   Pulse 77   Temp 97.9 F (36.6 C)   Resp (!) 21   SpO2 99%   Physical Exam Constitutional:      Comments: Patient is alert and appropriate.  Mental status is clear.  No respiratory distress at rest.  Appears slightly pale and fatigued.  HENT:     Head: Normocephalic and atraumatic.  Eyes:     Extraocular Movements: Extraocular movements intact.     Conjunctiva/sclera: Conjunctivae normal.  Cardiovascular:     Rate and Rhythm: Normal rate and regular  rhythm.  Pulmonary:     Effort: Pulmonary effort is normal.     Comments:  Breath sounds are significantly diminished at the right lung field.  Clear breath sounds on the left. Abdominal:     Comments: Abdomen is soft without guarding or severe distention.  She does have general tenderness.  Musculoskeletal: Normal range of motion.        General: No swelling.     Right lower leg: No edema.     Left lower leg: No edema.  Skin:    General: Skin is warm and dry.     Coloration: Skin is pale.  Neurological:     General: No focal deficit present.     Mental Status: She is oriented to person, place, and time.     Coordination: Coordination normal.  Psychiatric:        Mood and Affect: Mood normal.      ED Treatments / Results  Labs (all labs ordered are listed, but only abnormal results are displayed) Labs Reviewed  BASIC METABOLIC PANEL - Abnormal; Notable for the following components:      Result Value   Sodium 131 (*)    Glucose, Bld 107 (*)    BUN 24 (*)    Calcium 8.1 (*)    All other components within normal limits  CBC - Abnormal; Notable for the following components:   RBC 2.74 (*)    Hemoglobin 8.5 (*)    HCT 27.7 (*)    MCV 101.1 (*)    RDW 19.1 (*)    nRBC 7.8 (*)    All other components within normal limits  HEPATIC FUNCTION PANEL - Abnormal; Notable for the following components:   Total Protein 4.9 (*)    Albumin 2.2 (*)    AST 78 (*)    ALT 48 (*)    Alkaline Phosphatase 446 (*)    Total Bilirubin 1.4 (*)    Bilirubin, Direct 0.5 (*)    All other components within normal limits  PROTIME-INR - Abnormal; Notable for the following components:   Prothrombin Time 35.5 (*)    INR 3.6 (*)    All other components within normal limits  MAGNESIUM - Abnormal; Notable for the following components:   Magnesium 2.7 (*)    All other components within normal limits  DIFFERENTIAL - Abnormal; Notable for the following components:   Monocytes Absolute 2.3 (*)    Abs Immature Granulocytes 0.39 (*)    All other components within normal limits   CULTURE, BLOOD (ROUTINE X 2)  CULTURE, BLOOD (ROUTINE X 2)  GI PATHOGEN PANEL BY PCR, STOOL  C DIFFICILE QUICK SCREEN W PCR REFLEX  SARS CORONAVIRUS 2 (TAT 6-24 HRS)  LIPASE, BLOOD  PHOSPHORUS  URINALYSIS, ROUTINE W REFLEX MICROSCOPIC  LACTIC ACID, PLASMA  LACTIC ACID, PLASMA  TSH  CBG MONITORING, ED    EKG None  Radiology No results found.  Procedures Procedures (including critical care time)  Medications Ordered in ED Medications  sodium chloride flush (NS) 0.9 % injection 3 mL (3 mLs Intravenous Given 11/01/18 1107)  sodium chloride 0.9 % bolus 500 mL (500 mLs Intravenous New Bag/Given 11/01/18 1334)  0.9 %  sodium chloride infusion ( Intravenous New Bag/Given 11/01/18 1333)  morphine 4 MG/ML injection 4 mg (4 mg Intravenous Given 11/01/18 1335)  ondansetron (ZOFRAN) injection 4 mg (4 mg Intravenous Given 11/01/18 1334)     Initial Impression / Assessment and Plan / ED Course  I have  reviewed the triage vital signs and the nursing notes.  Pertinent labs & imaging results that were available during my care of the patient were reviewed by me and considered in my medical decision making (see chart for details).  Clinical Course as of Oct 31 1398  Tue Nov 01, 2018  1359 Consult: Dr. Lonny Prude for triad hospitalist. Will evaluate in the ED for admission.   [MP]    Clinical Course User Index [MP] Charlesetta Shanks, MD      Patient presents with severe generalized weakness, very poor oral intake and incontinence of diarrhea still with active pancreatic cancer metastatic to liver.  Symptoms have been worsening over the past week.  Patient reports she is too weak to get out of bed.  Rehydration initiated in the emergency department.  Will also order stool cultures.  Plan for observation admission for complications of metastatic pancreatic cancer.  Final Clinical Impressions(s) / ED Diagnoses   Final diagnoses:  Generalized weakness  Intractable diarrhea  Metastasis from  pancreatic cancer [facility])    ED Discharge Orders    None       Charlesetta Shanks, MD 11/01/18 1401    Charlesetta Shanks, MD 11/01/18 1404

## 2018-11-01 NOTE — ED Notes (Addendum)
Pt states that she was seen here on this past Sunday for treatment of fecal impactions, and since being home has had increased weakness, inability to walk, poor appetite. Pt does report Rt quad pain 4/10. Pt denies and vomiting or nausea, but does report that she has been having loose stools that come in waves of defecation.

## 2018-11-01 NOTE — H&P (Signed)
History and Physical    Taylor Oconnor [id number] DOB: 04/08/1950 DOA: 11/01/2018  PCP: Unk Pinto, MD Patient coming from: Home  Chief Complaint: Weakness  HPI: Taylor Oconnor is a 68 y.o. female with medical history significant of breast cancer, pancreatic cancer diabetes mellitus, obesity, gastric banding.  Patient presented secondary to multiple week history of progressive weakness.  Symptoms were very bad after last chemotherapy treatment.  She reports being so weak that her husband has to carry her at times.  Her biggest issue is getting up, but when she is up she is usually able to move around with a walker.  She reports some decreased appetite which is caused her to eat less.  She reports a little bit of nausea but no vomiting.  Her abdominal pain is chronic and stable.  She has had some issues with impaction for which she was disimpacted a few days ago in the emergency department and has now resultant loose stools.  ED Course: Vitals: Afebrile, normal pulse, respirations of 21/min, normotensive, on room air Labs: Sodium of 131, BUN of 24, magnesium of 2.7, AST/ALT of 78/48 respectively, alkaline phosphatase of 446, hemoglobin of 8.5 Imaging: None obtained Medications/Course: IV fluids, Zofran, morphine  Review of Systems: Review of Systems  Constitutional: Negative for chills and fever.  Cardiovascular: Negative for chest pain.  Gastrointestinal: Positive for abdominal pain (chronic), constipation, diarrhea and nausea. Negative for vomiting.  Neurological: Positive for weakness. Negative for focal weakness and loss of consciousness.  All other systems reviewed and are negative.   Past Medical History:  Diagnosis Date  . Allergy    SEASONAL  . Anxiety   . At risk for difficult insertion of breathing tube   . Breast cancer of upper-outer quadrant of right female breast ([place]) 12/03/2014  . Common bile duct (CBD) obstruction   . Difficult intubation pt has large  tonsils,  . Family history of breast cancer   . GERD (gastroesophageal reflux disease)    hx - resolved with lap band surgery  . H/O hiatal hernia    resolved with band surgery  . H/O laparoscopic adjustable gastric banding 2010  . History of kidney stones 02/2018  . Hyperlipidemia   . Hypertension   . Hypokalemia   . Insomnia    tx with zanax  . Obesity   . Pancreatic mass   . PCOS (polycystic ovarian syndrome)   . Personal history of radiation therapy   . 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 Type II or unspecified type diabetes mellitus without mention of complication, not stated as uncontrolled 12/14/2012   no meds, diet controlled  . Wears contact lenses   . Wears glasses     Past Surgical History:  Procedure Laterality Date  . BACK SURGERY     cervical  . BILIARY STENT PLACEMENT N/A 07/01/2018   Procedure: BILIARY STENT PLACEMENT;  Surgeon: Ladene Artist, MD;  Location: [facility];  Service: Endoscopy;  Laterality: N/A;  cbd brushing  . BREAST BIOPSY    . BREAST LUMPECTOMY Right 2016  . BREAST LUMPECTOMY WITH NEEDLE LOCALIZATION AND AXILLARY SENTINEL LYMPH NODE BX Right 12/28/2014   Procedure: RIGHT BREAST LUMPECTOMY WITH TWO (2) NEEDLE LOCALIZATION AND AXILLARY SENTINEL LYMPH NODE BX;  Surgeon: Alphonsa Overall, MD;  Location: [facility];  Service: General;  Laterality: Right;  . CESAREAN SECTION     x 1  . CHOLECYSTECTOMY    . COLONOSCOPY  09/29/2004  normal  . CYSTOSCOPY WITH RETROGRADE PYELOGRAM, URETEROSCOPY AND STENT PLACEMENT Right 03/24/2018   Procedure: CYSTOSCOPY WITH RETROGRADE PYELOGRAM, URETEROSCOPY AND STENT PLACEMENT;  Surgeon: Franchot Gallo, MD;  Location: [facility];  Service: Urology;  Laterality: Right;  1 HR  . DIAGNOSTIC LAPAROSCOPY    . ERCP N/A 07/01/2018   Procedure: ENDOSCOPIC RETROGRADE CHOLANGIOPANCREATOGRAPHY (ERCP);  Surgeon: Ladene Artist, MD;  Location: [facility];  Service: Endoscopy;   Laterality: N/A;  . ESOPHAGOGASTRODUODENOSCOPY (EGD) WITH PROPOFOL N/A 07/14/2018   Procedure: ESOPHAGOGASTRODUODENOSCOPY (EGD) WITH PROPOFOL;  Surgeon: Milus Banister, MD;  Location: [facility];  Service: Endoscopy;  Laterality: N/A;  . EUS N/A 07/14/2018   Procedure: UPPER ENDOSCOPIC ULTRASOUND (EUS) RADIAL;  Surgeon: Milus Banister, MD;  Location: [facility];  Service: Endoscopy;  Laterality: N/A;  . FINE NEEDLE ASPIRATION  07/14/2018   Procedure: FINE NEEDLE ASPIRATION (FNA) RADIAL;  Surgeon: Milus Banister, MD;  Location: [facility];  Service: Endoscopy;;  . HERNIA REPAIR  2008  . IR IMAGING GUIDED PORT INSERTION  07/20/2018  . LAPAROSCOPIC APPENDECTOMY N/A 08/06/2012   Procedure: APPENDECTOMY LAPAROSCOPIC;  Surgeon: Earnstine Regal, MD;  Location: [facility];  Service: General;  Laterality: N/A;  . LAPAROSCOPIC GASTRIC BANDING WITH HIATAL HERNIA REPAIR N/A Nov 2008  . SPHINCTEROTOMY  07/01/2018   Procedure: SPHINCTEROTOMY;  Surgeon: Ladene Artist, MD;  Location: [facility];  Service: Endoscopy;;  . TUBAL LIGATION       reports that she has never smoked. She has never used smokeless tobacco. She reports that she does not drink alcohol or use drugs.  Allergies  Allergen Reactions  . Oxaliplatin Anaphylaxis  . Codeine Itching  . Lipitor [Atorvastatin] Other (See Comments)    Severe fatigue. ? memory loss.  . Ace Inhibitors Cough    Family History  Problem Relation Age of Onset  . Cirrhosis Mother   . Alcohol abuse Mother   . Liver disease Mother   . Diabetes Father   . Hypertension Father   . Stroke Father   . Heart disease Father   . AAA (abdominal aortic aneurysm) Sister 60       twin sister  . Cancer Cousin        Breast  . Breast cancer Cousin        x3  . Colon cancer Neg Hx   . Rectal cancer Neg Hx   . Stomach cancer Neg Hx    Prior to Admission medications   Medication Sig Start Date End Date Taking? Authorizing Provider  anastrozole (ARIMIDEX) 1 MG tablet  TAKE 1 TABLET BY MOUTH EVERY DAY Patient taking differently: Take 1 mg by mouth daily.  03/21/18  Yes Causey, Charlestine Massed, NP  atenolol (TENORMIN) 100 MG tablet TAKE 1 TABLET BY MOUTH EVERY DAY Patient taking differently: Take 50 mg by mouth daily after supper.  03/19/18  Yes Unk Pinto, MD  magic mouthwash SOLN Take 5 mLs by mouth 4 (four) times daily as needed for mouth pain. 10/28/18  Yes Truitt Merle, MD  nystatin (MYCOSTATIN) 100000 UNIT/ML suspension TAKE 5 ML BY MOUTH THREE TIMES DAILY AS DIRECTED Patient taking differently: Use as directed 5 mLs in the mouth or throat 3 (three) times daily as needed (irritation).  10/02/18  Yes Truitt Merle, MD  ondansetron (ZOFRAN ODT) 8 MG disintegrating tablet Take 1 tablet (8 mg total) by mouth every 8 (eight) hours as needed for nausea or vomiting. 10/17/18  Yes Truitt Merle, MD  oxyCODONE (  OXY IR/ROXICODONE) 5 MG immediate release tablet 1 to 2 PO q 4 hours prn pain Patient taking differently: Take 2.5-5 mg by mouth every 6 (six) hours as needed for moderate pain or severe pain.  10/24/18  Yes Tanner, Lyndon Code., PA-C  prochlorperazine (COMPAZINE) 10 MG tablet Take 10 mg by mouth every 6 (six) hours as needed for nausea or vomiting.   Yes [provider]  promethazine (PHENERGAN) 25 MG tablet Take 1/2-1 tab every 6 hours as needed for nausea. Patient taking differently: Take 25 mg by mouth at bedtime.  06/29/18  Yes Liane Comber, NP  Rivaroxaban 15 & 20 MG TBPK Follow package directions: Take one 15mg  tablet by mouth twice a day. On day 22, switch to one 20mg  tablet once a day. Take with food. Patient taking differently: Take 15 mg by mouth 2 (two) times daily. Follow package directions: Take one 15mg  tablet by mouth twice a day. On day 22, switch to one 20mg  tablet once a day. Take with food. 10/17/18  Yes Truitt Merle, MD  rosuvastatin (CRESTOR) 10 MG tablet Take 1 tablet Daily for Cholesterol Patient taking differently: Take 10 mg by mouth daily.   08/29/18  Yes Unk Pinto, MD  traMADol (ULTRAM) 50 MG tablet Take 1 tablet (50 mg total) by mouth every 6 (six) hours as needed. Patient taking differently: Take 50 mg by mouth every 6 (six) hours as needed for moderate pain.  09/26/18  Yes Alla Feeling, NP  traZODone (DESYREL) 150 MG tablet Take 1/2 to 1 tablet 1 to 2 hours before Bedtime Patient taking differently: Take 150 mg by mouth at bedtime.  06/01/18  Yes Unk Pinto, MD  verapamil (CALAN) 80 MG tablet Take 1 tablet 2 x /day with meal for BP Patient taking differently: Take 80 mg by mouth 2 (two) times daily with a meal.  02/14/18  Yes Unk Pinto, MD  lidocaine (XYLOCAINE) 2 % solution Use as directed 5 mLs in the mouth or throat every 3 (three) hours as needed for mouth pain. Patient not taking: Reported on 11/01/2018 08/02/18   Harle Stanford., PA-C    Physical Exam:  Physical Exam Constitutional:      General: She is not in acute distress.    Appearance: She is well-developed. She is not diaphoretic.  HENT:     Mouth/Throat:     Mouth: Mucous membranes are dry.  Eyes:     Conjunctiva/sclera: Conjunctivae normal.     Pupils: Pupils are equal, round, and reactive to light.  Neck:     Musculoskeletal: Normal range of motion.  Cardiovascular:     Rate and Rhythm: Normal rate and regular rhythm.     Heart sounds: Normal heart sounds. No murmur.  Pulmonary:     Effort: Pulmonary effort is normal. No respiratory distress.     Breath sounds: Normal breath sounds. No wheezing or rales.  Abdominal:     General: Bowel sounds are normal. There is distension.     Palpations: Abdomen is soft.     Tenderness: There is no abdominal tenderness. There is no guarding or rebound.  Musculoskeletal: Normal range of motion.        General: No tenderness.     Right lower leg: No edema.     Left lower leg: No edema.  Lymphadenopathy:     Cervical: No cervical adenopathy.  Skin:    General: Skin is warm and dry.   Neurological:     Mental Status:  She is alert and oriented to person, place, and time.     Labs on Admission: I have personally reviewed following labs and imaging studies  CBC: Recent Labs  Lab 10/28/18 1300 10/31/18 0033 11/01/18 1105  WBC 6.9 3.8* 6.9  NEUTROABS 4.5  --  2.7  HGB 8.9* 8.8* 8.5*  HCT 27.3* 29.0* 27.7*  MCV 95.8 102.1* 101.1*  PLT 132* 270 0000000    Basic Metabolic Panel: Recent Labs  Lab 10/28/18 1300 10/31/18 0033 11/01/18 1105  NA 136 133* 131*  K 4.2 3.6 4.0  CL 99 101 98  CO2 27 23 24   GLUCOSE 136* 144* 107*  BUN 18 18 24*  CREATININE 0.70 0.63 0.67  CALCIUM 8.5* 8.3* 8.1*  MG  --   --  2.7*  PHOS  --   --  3.2    GFR: Estimated Creatinine Clearance: 67.2 mL/min (by C-G formula based on SCr of 0.67 mg/dL).  Liver Function Tests: Recent Labs  Lab 10/28/18 1300 10/31/18 0033 11/01/18 1105  AST 54* 66* 78*  ALT 31 47* 48*  ALKPHOS 425* 438* 446*  BILITOT 0.9 1.4* 1.4*  PROT 5.5* 5.3* 4.9*  ALBUMIN 2.4* 2.5* 2.2*   Recent Labs  Lab 10/31/18 0033 11/01/18 1105  LIPASE 12 12   No results for input(s): AMMONIA in the last 168 hours.  Coagulation Profile: Recent Labs  Lab 11/01/18 1105  INR 3.6*    Cardiac Enzymes: No results for input(s): CKTOTAL, CKMB, CKMBINDEX, TROPONINI in the last 168 hours.  BNP (last 3 results) No results for input(s): PROBNP in the last 8760 hours.  HbA1C: No results for input(s): HGBA1C in the last 72 hours.  CBG: Recent Labs  Lab 11/01/18 1137  GLUCAP 93    Lipid Profile: No results for input(s): CHOL, HDL, LDLCALC, TRIG, CHOLHDL, LDLDIRECT in the last 72 hours.  Thyroid Function Tests: No results for input(s): TSH, T4TOTAL, FREET4, T3FREE, THYROIDAB in the last 72 hours.  Anemia Panel: No results for input(s): VITAMINB12, FOLATE, FERRITIN, TIBC, IRON, RETICCTPCT in the last 72 hours.  Urine analysis:    Component Value Date/Time   COLORURINE YELLOW 10/25/2018 1452    APPEARANCEUR HAZY (A) 10/25/2018 1452   LABSPEC 1.024 10/25/2018 1452   PHURINE 6.0 10/25/2018 1452   GLUCOSEU NEGATIVE 10/25/2018 1452   HGBUR MODERATE (A) 10/25/2018 1452   BILIRUBINUR NEGATIVE 10/25/2018 1452   KETONESUR NEGATIVE 10/25/2018 1452   PROTEINUR NEGATIVE 10/25/2018 1452   UROBILINOGEN 0.2 05/30/2013 0903   NITRITE NEGATIVE 10/25/2018 1452   LEUKOCYTESUR NEGATIVE 10/25/2018 1452     Radiological Exams on Admission: No results found.  EKG: Independently reviewed. Sinus rhythm.  Assessment/Plan Active Problems:   Weakness    Weakness Likely multifactorial from chronic illness and chemotherapy. Does not appear to have immediately reversible causes. Complicated by poor oral intake from poor appetite. Will probably need therapy -Observation overnight -PT/OT evaluation  Abdominal pain Secondary to metastatic pancreatic cancer.  Patient is on chronic pain regimen as an outpatient for which she is recently held secondary to issues with constipation and impaction.  She has been using tramadol more recently instead of her OxyContin. -Continue oxycodone and tramadol prn  Pancreatic cancer Patient with associated diffuse metastasis to liver.  Patient is currently undergoing palliative chemotherapy as an outpatient. Oncology placed on care teams.  Breast cancer Patient is status post right lumpectomy and adjuvant radiation therapy.  She is currently on anastrozole as an outpatient. -Continue anastrozole  Poor appetite Patient  with no significant nausea (some, but manageable), no vomiting and no pain prohibiting oral intake. Per primary oncology note, she was recommended to drink protein supplements and was referred for dietician evaluation -Consult dietician  Liver thrombosis Currently on Xarelto -Continue Xarelto  Chronic anemia Stable.  Essential hypertension Soft today but no hypotension.  -Continue home atenolol -Hold verapamil overnight  Elevated liver  enzymes Chronic.  Hypermagnesemia Slightly elevated. Repeat in AM  Insomnia -Continue Trazodone  DVT prophylaxis: Lovenox Code Status: DNR Family Communication: None Disposition Plan: Medical floor. Discharge likely home tomorrow vs rehab pending PT evaluation Consults called: None (Oncology added to care team) Admission status: Observation   Cordelia Poche, MD [facility] 11/01/2018, 2:10 PM

## 2018-11-02 ENCOUNTER — Ambulatory Visit: Payer: Medicare Other

## 2018-11-02 DIAGNOSIS — R627 Adult failure to thrive: Secondary | ICD-10-CM | POA: Diagnosis not present

## 2018-11-02 DIAGNOSIS — R279 Unspecified lack of coordination: Secondary | ICD-10-CM | POA: Diagnosis not present

## 2018-11-02 DIAGNOSIS — C259 Malignant neoplasm of pancreas, unspecified: Secondary | ICD-10-CM

## 2018-11-02 DIAGNOSIS — R748 Abnormal levels of other serum enzymes: Secondary | ICD-10-CM | POA: Diagnosis present

## 2018-11-02 DIAGNOSIS — Z743 Need for continuous supervision: Secondary | ICD-10-CM | POA: Diagnosis not present

## 2018-11-02 DIAGNOSIS — C799 Secondary malignant neoplasm of unspecified site: Secondary | ICD-10-CM

## 2018-11-02 DIAGNOSIS — E785 Hyperlipidemia, unspecified: Secondary | ICD-10-CM | POA: Diagnosis present

## 2018-11-02 DIAGNOSIS — K5641 Fecal impaction: Secondary | ICD-10-CM | POA: Diagnosis present

## 2018-11-02 DIAGNOSIS — N390 Urinary tract infection, site not specified: Secondary | ICD-10-CM | POA: Diagnosis present

## 2018-11-02 DIAGNOSIS — R52 Pain, unspecified: Secondary | ICD-10-CM | POA: Diagnosis present

## 2018-11-02 DIAGNOSIS — E861 Hypovolemia: Secondary | ICD-10-CM | POA: Diagnosis present

## 2018-11-02 DIAGNOSIS — D649 Anemia, unspecified: Secondary | ICD-10-CM | POA: Diagnosis present

## 2018-11-02 DIAGNOSIS — Z515 Encounter for palliative care: Secondary | ICD-10-CM | POA: Diagnosis not present

## 2018-11-02 DIAGNOSIS — C787 Secondary malignant neoplasm of liver and intrahepatic bile duct: Secondary | ICD-10-CM | POA: Diagnosis present

## 2018-11-02 DIAGNOSIS — I81 Portal vein thrombosis: Secondary | ICD-10-CM | POA: Diagnosis present

## 2018-11-02 DIAGNOSIS — E871 Hypo-osmolality and hyponatremia: Secondary | ICD-10-CM | POA: Diagnosis present

## 2018-11-02 DIAGNOSIS — B9689 Other specified bacterial agents as the cause of diseases classified elsewhere: Secondary | ICD-10-CM | POA: Diagnosis present

## 2018-11-02 DIAGNOSIS — R630 Anorexia: Secondary | ICD-10-CM | POA: Diagnosis present

## 2018-11-02 DIAGNOSIS — Z20828 Contact with and (suspected) exposure to other viral communicable diseases: Secondary | ICD-10-CM | POA: Diagnosis present

## 2018-11-02 DIAGNOSIS — G4700 Insomnia, unspecified: Secondary | ICD-10-CM | POA: Diagnosis present

## 2018-11-02 DIAGNOSIS — I10 Essential (primary) hypertension: Secondary | ICD-10-CM | POA: Diagnosis not present

## 2018-11-02 DIAGNOSIS — G893 Neoplasm related pain (acute) (chronic): Secondary | ICD-10-CM | POA: Diagnosis present

## 2018-11-02 DIAGNOSIS — E119 Type 2 diabetes mellitus without complications: Secondary | ICD-10-CM | POA: Diagnosis present

## 2018-11-02 DIAGNOSIS — R531 Weakness: Secondary | ICD-10-CM | POA: Diagnosis not present

## 2018-11-02 DIAGNOSIS — K219 Gastro-esophageal reflux disease without esophagitis: Secondary | ICD-10-CM | POA: Diagnosis present

## 2018-11-02 DIAGNOSIS — Z66 Do not resuscitate: Secondary | ICD-10-CM | POA: Diagnosis present

## 2018-11-02 DIAGNOSIS — R197 Diarrhea, unspecified: Secondary | ICD-10-CM | POA: Diagnosis present

## 2018-11-02 LAB — CBC
HCT: 27.4 % — ABNORMAL LOW (ref 36.0–46.0)
Hemoglobin: 8.2 g/dL — ABNORMAL LOW (ref 12.0–15.0)
MCH: 30.5 pg (ref 26.0–34.0)
MCHC: 29.9 g/dL — ABNORMAL LOW (ref 30.0–36.0)
MCV: 101.9 fL — ABNORMAL HIGH (ref 80.0–100.0)
Platelets: 430 10*3/uL — ABNORMAL HIGH (ref 150–400)
RBC: 2.69 MIL/uL — ABNORMAL LOW (ref 3.87–5.11)
RDW: 18.9 % — ABNORMAL HIGH (ref 11.5–15.5)
WBC: 9 10*3/uL (ref 4.0–10.5)
nRBC: 5.8 % — ABNORMAL HIGH (ref 0.0–0.2)

## 2018-11-02 LAB — MAGNESIUM: Magnesium: 2.2 mg/dL (ref 1.7–2.4)

## 2018-11-02 LAB — URINALYSIS, ROUTINE W REFLEX MICROSCOPIC
Bilirubin Urine: NEGATIVE
Glucose, UA: NEGATIVE mg/dL
Ketones, ur: NEGATIVE mg/dL
Leukocytes,Ua: NEGATIVE
Nitrite: POSITIVE — AB
Protein, ur: NEGATIVE mg/dL
Specific Gravity, Urine: 1.015 (ref 1.005–1.030)
pH: 6 (ref 5.0–8.0)

## 2018-11-02 LAB — BASIC METABOLIC PANEL
Anion gap: 8 (ref 5–15)
BUN: 17 mg/dL (ref 8–23)
CO2: 23 mmol/L (ref 22–32)
Calcium: 8 mg/dL — ABNORMAL LOW (ref 8.9–10.3)
Chloride: 101 mmol/L (ref 98–111)
Creatinine, Ser: 0.56 mg/dL (ref 0.44–1.00)
GFR calc Af Amer: >60 mL/min (ref >60)
GFR calc non Af Amer: >60 mL/min (ref >60)
Glucose, Bld: 106 mg/dL — ABNORMAL HIGH (ref 70–99)
Potassium: 3.9 mmol/L (ref 3.5–5.1)
Sodium: 132 mmol/L — ABNORMAL LOW (ref 135–145)

## 2018-11-02 LAB — T4, FREE: Free T4: 1.17 ng/dL — ABNORMAL HIGH (ref 0.61–1.12)

## 2018-11-02 MED ORDER — MORPHINE SULFATE (PF) 2 MG/ML IV SOLN
1.0000 mg | INTRAVENOUS | Status: DC | PRN
  Administered 2018-11-02 – 2018-11-03 (×4): 1 mg via INTRAVENOUS
  Filled 2018-11-02 (×4): qty 1

## 2018-11-02 MED ORDER — ADULT MULTIVITAMIN W/MINERALS CH
1.0000 | ORAL_TABLET | Freq: Every day | ORAL | Status: DC
  Administered 2018-11-02 – 2018-11-03 (×2): 1 via ORAL
  Filled 2018-11-02 (×3): qty 1

## 2018-11-02 MED ORDER — SODIUM CHLORIDE 0.9 % IV SOLN
1.0000 g | INTRAVENOUS | Status: AC
  Administered 2018-11-02 – 2018-11-04 (×3): 1 g via INTRAVENOUS
  Filled 2018-11-02 (×3): qty 1

## 2018-11-02 MED ORDER — ENSURE ENLIVE PO LIQD
237.0000 mL | Freq: Two times a day (BID) | ORAL | Status: DC
  Administered 2018-11-02 – 2018-11-07 (×7): 237 mL via ORAL

## 2018-11-02 MED ORDER — SODIUM CHLORIDE 0.9 % IV SOLN
INTRAVENOUS | Status: DC
  Administered 2018-11-02 – 2018-11-06 (×6): via INTRAVENOUS

## 2018-11-02 NOTE — Progress Notes (Signed)
PROGRESS NOTE    Taylor Oconnor  [id number] DOB: 1950/05/03 DOA: 11/01/2018 PCP: Unk Pinto, MD    Brief Narrative:   Taylor Oconnor is a 68 y.o. female with medical history significant of breast cancer, pancreatic cancer diabetes mellitus, obesity, gastric banding.  Patient presented secondary to multiple week history of progressive weakness.  Symptoms were very bad after last chemotherapy treatment.  She reports being so weak that her husband has to carry her at times.  Her biggest issue is getting up, but when she is up she is usually able to move around with a walker.  She reports some decreased appetite which is caused her to eat less.  She reports a little bit of nausea but no vomiting.  Her abdominal pain is chronic and stable.  She has had some issues with impaction for which she was disimpacted a few days ago in the emergency department and has now resultant loose stools.  ED Course: Afebrile, normal pulse, respirations of 21/min, normotensive, on room air. Sodium 131, BUN 24, magnesium 2.7, AST/ALT 78/48 respectively, alkaline phosphatase of 446, hemoglobin of 8.5 Imaging: None obtained. Medications/Course: IV fluids, Zofran, morphine. EDP referred to [facility] for admission with profound weakness, hyponatremia, intractable pain of malignancy, and adult failure to thrive.  Assessment & Plan:   Active Problems:   Essential hypertension   Morbid obesity (HCC)   Pancreatic cancer (HCC)   Weakness   Intractable pain   Weakness Adult failure to thrive Likely multifactorial from chronic illness and chemotherapy. Does not appear to have immediately reversible causes. Complicated by poor oral intake from poor appetite.  --Continue supportive care, IV fluid hydration w/ NS @ 66mL per hour --Awaiting palliative care and oncology evaluation  Abdominal pain Intractable pain of malignancy Secondary to metastatic pancreatic cancer.  Patient is on chronic pain regimen as an outpatient  for which she is recently held secondary to issues with constipation and impaction.  She has been using tramadol more recently instead of her OxyContin. --Continue oxycodone and tramadol prn --morphine 1mg  q3h prn for breakthrough pain not relieved with oral medication  Urinary retention Dysuria Patient reports difficulty urinating with associated pain.  Patient is afebrile without leukocytosis.  Urinalysis notable for negative leukoesterase, positive nitrite, many bacteria.  Bladder scan with greater than 350 retained urine.  Status post in and out catheterization. --start ceftriaxone for UTI --Urine culture: Pending --Blood cultures x2: No growth x24 hours --Continue bladder scan every 6 hours or after next void, if continues to have retained urine will need Foley catheter placement.  Hyponatremia, likely secondary to hypovolemia Sodium 131 on admission.  Etiology likely secondary to poor oral intake and hypovolemic hyponatremia. --Continue IV fluid hydration with NS at 75 mL's per hour --Repeat BMP in the a.m.  Pancreatic cancer Patient with associated diffuse metastasis to liver.  Patient is currently undergoing palliative chemotherapy as an outpatient.   Breast cancer Patient is status post right lumpectomy and adjuvant radiation therapy.  She is currently on anastrozole as an outpatient. --Continue anastrozole  Poor appetite Patient with nausea, no vomiting and no pain; which is prohibiting oral intake. Per primary oncology note, she was recommended to drink protein supplements and was referred for dietician evaluation --Continue to encourage increased oral intake --Nutrition consult --Supportive care, antiemetics  Liver thrombosis: Continue Xarelto  Chronic anemia Stable.  Hemoglobin 8.2  Essential hypertension BP 125/63 today --Continue home atenolol --Continue to hold home verapamil  Elevated liver enzymes, Chronic.  Insomnia --Continue  Trazodone   DVT  prophylaxis: Lovenox Code Status: DNR Family Communication: none Disposition Plan: Continue inpatient, IV fluid hydration, awaiting further discussion with oncology/palliative care.   Consultants:   Oncology  Palliative care  Procedures:   none  Antimicrobials:   none   Subjective: Patient seen and examined bedside, resting comfortably.  Continues to have intermittent episodes of pain.  Continues with profound weakness/fatigue.  Continues also with very poor appetite.  No other complaints.  Denies chest pain, no shortness of breath, no palpitations.  No acute events overnight per nursing staff.  Objective: Vitals:   11/01/18 1620 11/01/18 1857 11/01/18 1938 11/02/18 0553  BP: 91/63 101/61 92/72 125/63  Pulse: 82 88 92 (!) 103  Resp: 20 17 17    Temp:   97.9 F (36.6 C) 97.8 F (36.6 C)  TempSrc:   Oral Oral  SpO2: 98% 98% 99% 98%  Weight:   81 kg   Height:   5\' 3"  (1.6 m)     Intake/Output Summary (Last 24 hours) at 11/02/2018 1458 Last data filed at 11/02/2018 1350 Gross per 24 hour  Intake -  Output 351 ml  Net -351 ml   Filed Weights   11/01/18 1938  Weight: 81 kg    Examination:  General exam: Appears calm and comfortable, chronically ill appearance Respiratory system: Clear to auscultation. Respiratory effort normal. Cardiovascular system: S1 & S2 heard, RRR. No JVD, murmurs, rubs, gallops or clicks. No pedal edema. Gastrointestinal system: Abdomen is nondistended, soft, mild epigastric/right upper quadrant tenderness on palpation. No organomegaly or masses felt. Normal bowel sounds heard. Central nervous system: Alert and oriented. No focal neurological deficits. Extremities: Symmetric 5 x 5 power. Skin: No rashes, lesions or ulcers Psychiatry: Judgement and insight appear normal. Mood & affect appropriate.     Data Reviewed: I have personally reviewed following labs and imaging studies  CBC: Recent Labs  Lab 10/28/18 1300 10/31/18 0033  11/01/18 1105 11/02/18 0606  WBC 6.9 3.8* 6.9 9.0  NEUTROABS 4.5  --  2.7  --   HGB 8.9* 8.8* 8.5* 8.2*  HCT 27.3* 29.0* 27.7* 27.4*  MCV 95.8 102.1* 101.1* 101.9*  PLT 132* 270 392 [id number]*   Basic Metabolic Panel: Recent Labs  Lab 10/28/18 1300 10/31/18 0033 11/01/18 1105 11/02/18 0606  NA 136 133* 131* 132*  K 4.2 3.6 4.0 3.9  CL 99 101 98 101  CO2 27 23 24 23   GLUCOSE 136* 144* 107* 106*  BUN 18 18 24* 17  CREATININE 0.70 0.63 0.67 0.56  CALCIUM 8.5* 8.3* 8.1* 8.0*  MG  --   --  2.7* 2.2  PHOS  --   --  3.2  --    GFR: Estimated Creatinine Clearance: 68.7 mL/min (by C-G formula based on SCr of 0.56 mg/dL). Liver Function Tests: Recent Labs  Lab 10/28/18 1300 10/31/18 0033 11/01/18 1105  AST 54* 66* 78*  ALT 31 47* 48*  ALKPHOS 425* 438* 446*  BILITOT 0.9 1.4* 1.4*  PROT 5.5* 5.3* 4.9*  ALBUMIN 2.4* 2.5* 2.2*   Recent Labs  Lab 10/31/18 0033 11/01/18 1105  LIPASE 12 12   No results for input(s): AMMONIA in the last 168 hours. Coagulation Profile: Recent Labs  Lab 11/01/18 1105  INR 3.6*   Cardiac Enzymes: No results for input(s): CKTOTAL, CKMB, CKMBINDEX, TROPONINI in the last 168 hours. BNP (last 3 results) No results for input(s): PROBNP in the last 8760 hours. HbA1C: No results for input(s): HGBA1C in the last  72 hours. CBG: Recent Labs  Lab 11/01/18 1137  GLUCAP 93   Lipid Profile: No results for input(s): CHOL, HDL, LDLCALC, TRIG, CHOLHDL, LDLDIRECT in the last 72 hours. Thyroid Function Tests: Recent Labs    11/01/18 1315  TSH 6.049*  FREET4 1.17*   Anemia Panel: No results for input(s): VITAMINB12, FOLATE, FERRITIN, TIBC, IRON, RETICCTPCT in the last 72 hours. Sepsis Labs: Recent Labs  Lab 11/01/18 1315  LATICACIDVEN 1.8    Recent Results (from the past 240 hour(s))  Culture, Blood     Status: None   Collection Time: 10/24/18  2:20 PM   Specimen: Porta Cath; Blood  Result Value Ref Range Status   Specimen Description    Final    PORTA CATH Performed at [facility], [place] [address] [place], [place] [address]    Special Requests   Final    BOTTLES DRAWN AEROBIC AND ANAEROBIC Blood Culture adequate volume   Culture   Final    NO GROWTH 5 DAYS Performed at [facility], [place] [address], [place], [place] [address]    Report Status 10/29/2018 FINAL  Final  Culture, Blood     Status: None   Collection Time: 10/24/18  2:25 PM   Specimen: BLOOD LEFT HAND  Result Value Ref Range Status   Specimen Description   Final    BLOOD LEFT HAND Performed at [facility], [place] [address], [place], [place] [address]    Special Requests   Final    BOTTLES DRAWN AEROBIC AND ANAEROBIC Blood Culture adequate volume   Culture   Final    NO GROWTH 5 DAYS Performed at [facility], [place] [address] [place], [place] [address]    Report Status 10/29/2018 FINAL  Final  Urine Culture     Status: Abnormal   Collection Time: 10/25/18  2:52 PM   Specimen: Urine, Clean Catch  Result Value Ref Range Status   Specimen Description   Final    URINE, CLEAN CATCH Performed at [facility], [place] [address], [place], [place] [address]    Special Requests   Final    NONE Performed at [facility], [place] [address], [place], [place] [address]    Culture (A)  Final    <10,000 COLONIES/mL INSIGNIFICANT GROWTH Performed at [place] [address], [place], [place] [address]    Report Status 10/26/2018 FINAL  Final  C Difficile Quick Screen w PCR reflex     Status: None   Collection Time: 11/01/18  1:15 PM   Specimen: Nasopharyngeal Swab; Stool  Result Value Ref Range Status   C Diff antigen NEGATIVE NEGATIVE Final   C Diff toxin NEGATIVE NEGATIVE Final   C Diff interpretation No C. difficile detected.  Final    Comment: Performed at [facility], [place] [address] [place], [place] [address]  SARS  CORONAVIRUS 2 (TAT 6-24 HRS) Nasopharyngeal Nasopharyngeal Swab     Status: None   Collection Time: 11/01/18  1:15 PM   Specimen: Nasopharyngeal Swab  Result Value Ref Range Status   SARS Coronavirus 2 NEGATIVE NEGATIVE Final    Comment: (NOTE) SARS-CoV-2 target nucleic acids are NOT DETECTED. The SARS-CoV-2 RNA is generally detectable in upper and lower respiratory specimens during the acute phase of infection. Negative results do not preclude SARS-CoV-2 infection, do not rule out co-infections with other pathogens, and should not be used as  the sole basis for treatment or other patient management decisions. Negative results must be combined with clinical observations, patient history, and epidemiological information. The expected result is Negative. Fact Sheet for Patients: SugarRoll.be Fact Sheet for Healthcare Providers: https://www.woods-mathews.com/ This test is not yet approved or cleared by the [place] FDA and  has been authorized for detection and/or diagnosis of SARS-CoV-2 by FDA under an Emergency Use Authorization (EUA). This EUA will remain  in effect (meaning this test can be used) for the duration of the COVID-19 declaration under Section 56 4(b)(1) of the Act, 21 U.S.C. section 360bbb-3(b)(1), unless the authorization is terminated or revoked sooner. Performed at [facility], [place] [address] [place], [place] [address]   Culture, blood (routine x 2)     Status: None (Preliminary result)   Collection Time: 11/01/18  1:16 PM   Specimen: BLOOD  Result Value Ref Range Status   Specimen Description   Final    BLOOD RIGHT ANTECUBITAL Performed at [place] [address] [place], [place] [address]    Special Requests   Final    BOTTLES DRAWN AEROBIC AND ANAEROBIC Blood Culture adequate volume Performed at [place] [address] [place], [place] [address]    Culture    Final    NO GROWTH < 24 HOURS Performed at [place] [address] [place], [place] [address]    Report Status PENDING  Incomplete  Culture, blood (routine x 2)     Status: None (Preliminary result)   Collection Time: 11/01/18  1:16 PM   Specimen: BLOOD  Result Value Ref Range Status   Specimen Description   Final    BLOOD LEFT ANTECUBITAL Performed at [facility], [place] [address] [place], [place] [address]    Special Requests   Final    BOTTLES DRAWN AEROBIC AND ANAEROBIC Blood Culture adequate volume Performed at [place] [address], [place], [place] [address]    Culture   Final    NO GROWTH < 24 HOURS Performed at [place] [address] [place], [place] [address]    Report Status PENDING  Incomplete         Radiology Studies: No results found.      Scheduled Meds: . anastrozole  1 mg Oral Daily  . atenolol  50 mg Oral QPC supper  . Chlorhexidine Gluconate Cloth  6 each Topical Daily  . docusate sodium  100 mg Oral BID  . feeding supplement (ENSURE ENLIVE)  237 mL Oral BID BM  . multivitamin with minerals  1 tablet Oral Daily  . polyethylene glycol  17 g Oral Daily  . Rivaroxaban  15 mg Oral BID  . rosuvastatin  10 mg Oral Daily  . traZODone  150 mg Oral QHS   Continuous Infusions: . sodium chloride 75 mL/hr at 11/02/18 1047     LOS: 0 days   Time spent: 30 minutes spent on chart review, discussion with nursing staff, consultants, updating family and interview/physical exam; more than 50% of that time was spent in counseling and/or coordination of care.    Jarah Pember J [place] DO [facility] Pager [phone] If 7PM-7AM, please contact night-coverage [email or web address] Password [email or web address] 11/02/2018, 2:58 PM

## 2018-11-02 NOTE — Progress Notes (Signed)
Initial Nutrition Assessment  DOCUMENTATION CODES:   Obesity unspecified  INTERVENTION:   -Ensure Enlive po BID, each supplement provides 350 kcal and 20 grams of protein -Multivitamin with minerals daily  NUTRITION DIAGNOSIS:   Increased nutrient needs related to cancer and cancer related treatments as evidenced by estimated needs.  GOAL:   Patient will meet greater than or equal to 90% of their needs  MONITOR:   PO intake, Supplement acceptance, Labs, Weight trends, I & O's  REASON FOR ASSESSMENT:   Consult Assessment of nutrition requirement/status, Poor PO  ASSESSMENT:   68 y.o. female with medical history significant of breast cancer, pancreatic cancer diabetes mellitus, obesity, gastric banding.  Patient presented secondary to multiple week history of progressive weakness.  Symptoms were very bad after last chemotherapy treatment.  **RD working remotely**  Patient reports poor PO since last chemotherapy treatment on 10/9. Pt developed mouth sores and some nausea following treatment and was unable to eat much d/t pain. Pt has been able to tolerate some liquids but over the past week has mainly been subsisting on Ensure and broth. Per oncology notes, chemo has been put on hold. Palliative care to see pt for GOC. Will monitor for plan going forward. Pt is now on a dysphagia 1 diet.  RD to order Ensure supplements for additional kcal and protein.   Per weight records, pt has lost 46 lbs since January 2020 (21% wt loss x 9.5 months, significant for time frame).   Medications reviewed. Labs reviewed: Low Na Mg/Phos WNL  NUTRITION - FOCUSED PHYSICAL EXAM:  Unable to perform- working remotely.  Diet Order:   Diet Order            DIET - DYS 1 Room service appropriate? Yes; Fluid consistency: Thin  Diet effective now              EDUCATION NEEDS:   Not appropriate for education at this time  Skin:  Skin Assessment: Reviewed RN Assessment  Last BM:   10/20  Height:   Ht Readings from Last 1 Encounters:  11/01/18 5\' 3"  (1.6 m)    Weight:   Wt Readings from Last 1 Encounters:  11/01/18 81 kg    Ideal Body Weight:  52.3 kg  BMI:  Body mass index is 31.63 kg/m.  Estimated Nutritional Needs:   Kcal:  1800-2000  Protein:  80-95g  Fluid:  2L/day  Clayton Bibles, MS, RD, LDN Inpatient Clinical Dietitian Pager: [phone] After Hours Pager: [phone]

## 2018-11-02 NOTE — Progress Notes (Addendum)
HEMATOLOGY-ONCOLOGY PROGRESS NOTE  SUBJECTIVE: Taylor Oconnor presented to the emergency room with weakness.  She had been seen in the emergency room the day prior for a fecal impaction.  Was manually disimpacted and received an enema and magnesium citrate.  After ER discharge, developed frequent diarrhea.  She is also getting progressively weaker.  Her husband is having to carry her around the house at times.  She is not really able to walk very much.  Has no difficulty controlling her legs, but just states that they are overall weak.  She reports some pain with urination.  Has pure wick in place but has not really had any urine output (none recorded).  Urinalysis urine culture have been ordered already and are pending.  She has not had any fevers or chills.  Denies chest pain or shortness of breath.  She has some mild nausea but no vomiting.  Oncology History  Malignant neoplasm of upper-outer quadrant of right breast in female, estrogen receptor positive ([place])  11/15/2014 Mammogram   Right breast: mass warranting further evaluation   11/27/2014 Breast US   0.4 x 0.4 x 0.6 cm irregular hypoechoic mass within the right breast 10 o'clock position 7 cm from the nipple. Additionally there is a 0.7 x 0.8 x 0.8 cm taller than wide irregular hypoechoic mass with posterior acoustic shadowing within the right breast.   11/28/2014 Initial Biopsy   Right breast core needle biopsies, 2 masses: both invasive ductal carcinoma, grade 1, ER+ (100%), PR+ (85% -larger), HER2/neu negative, Ki67 5-10%.  Smaller mass PR-   11/28/2014 Clinical Stage   Stage IA: T1b N0   12/28/2014 Definitive Surgery   Right lumpectomies/SLNB: invasive ductal carcinoma, grade 2, repeat HER2/neu negative, 0/2 LN   12/28/2014 Pathologic Stage   Stage IA: npT1c pN0   12/28/2014 Oncotype testing   RS 16 (10% ROR)   02/18/2015 - 04/03/2015 Radiation Therapy    Right breast / 45 Gray at 1.8 [place] Cables per fraction x 25 fractions; right breast  boost / 16 Gray at [employer] per fraction x 8 fractions   05/13/2015 -  Anti-estrogen oral therapy   Anastrozole 1 mg daily.   05/30/2015 Survivorship   SCP mailed to patient in lieu of in person visit   08/01/2018 Genetic Testing   Negative genetic testing on the common hereditary cancer panel.  The Common Hereditary Gene Panel offered by [facility] includes sequencing and/or deletion duplication testing of the following 48 genes: APC, ATM, AXIN2, BARD1, BMPR1A, BRCA1, BRCA2, BRIP1, CDH1, CDK4, CDKN2A (p14ARF), CDKN2A (p16INK4a), CHEK2, CTNNA1, DICER1, EPCAM (Deletion/duplication testing only), GREM1 (promoter region deletion/duplication testing only), KIT, MEN1, MLH1, MSH2, MSH3, MSH6, MUTYH, NBN, NF1, NHTL1, PALB2, PDGFRA, PMS2, POLD1, POLE, PTEN, RAD50, RAD51C, RAD51D, RNF43, SDHB, SDHC, SDHD, SMAD4, SMARCA4. STK11, TP53, TSC1, TSC2, and VHL.  The following genes were evaluated for sequence changes only: SDHA and HOXB13 c.251G>A variant only. The report date is August 01, 2018.   Pancreatic cancer ([place])  06/30/2018 Imaging   CT AP WO Contrast 06/30/18  IMPRESSION: 1. New common bile duct dilatation and intrahepatic bile duct dilatation. New fullness of the pancreatic head which could represent rapidly developing obstructive neoplastic process or sequela of recent localized pancreatitis. Recommend further characterization with MRCP or ERCP. 2. No other acute or significant findings. No bowel obstruction or evidence of bowel wall inflammation. No free fluid or abscess collection. No free intraperitoneal air. 3. Additional chronic/incidental findings detailed above.   07/01/2018 Procedure   ERCP by Dr  Fuller Plan on 07/01/18  IMPRESSION - A single severe segmental biliary stricture was found in the lower third of the common bile duct. The stricture was malignant appearing. Brushed. - The entire biliary tree proximal to the stricture was dilated, secondary to the stricture. - Prior cholecystectomy. - A  biliary sphincterotomy was performed. - One plastic stent was placed into the common bile duct.   07/01/2018 Pathology Results   Diagnosis 07/01/18 BILE DUCT BRUSHING (SPECIMEN 1 OF 1 COLLECTED 07/01/2018) SUSPICIOUS FOR ADENOCARCINOMA. SEE COMMENT.   07/14/2018 Initial Diagnosis   Pancreatic cancer ([place])   07/14/2018 Procedure   EUS by Dr Ardis Hughs on 07/14/18 IMPRESSION PENDING    07/14/2018 Cancer Staging   Staging form: Exocrine Pancreas, AJCC 8th Edition - Clinical: Stage IB (cT2, cN0, cM0) - Signed by Truitt Merle, MD on 07/15/2018   07/21/2018 PET scan   IMPRESSION: 1. Mass in head of pancreas is intensely hypermetabolic compatible with primary pancreatic adenocarcinoma. 2. Enlarged and FDG avid porta hepatic lymph node compatible with metastatic adenopathy. 3. Extensive FDG avid liver metastases involve both lobes of liver and exhibit marked progression when compared with MRI from 07/01/2018. 4.  Aortic Atherosclerosis (ICD10-I70.0).   07/26/2018 - 10/03/2018 Chemotherapy   FOLFIRINOX q2weeks starting 07/26/18. Last cycle 10/03/18 due to disease progression.    10/14/2018 Imaging   CT AP W Contrast  IMPRESSION: Marked increase in diffuse liver metastases.   New mild portacaval and peripancreatic lymphadenopathy, consistent with metastatic disease.   Mild decrease in size of pancreatic head mass.   Diffuse portal vein thrombosis, new since prior exam. These results will be called to the ordering clinician or representative by the Radiologist Assistant, and communication documented in the PACS or zVision Dashboard.     10/21/2018 -  Chemotherapy   Second-line Gemcitibine and Abraxane 2 weeks on/1 week off starting 10/21/18       REVIEW OF SYSTEMS:   Constitutional: Denies fevers, chills.  Reports generalized fatigue and weakness Respiratory: Denies cough, dyspnea or wheezes Cardiovascular: Denies palpitation, chest discomfort Gastrointestinal: Reports mild nausea, no vomiting.   Had diarrhea secondary to laxative and enema.  None today. Skin: Denies abnormal skin rashes Lymphatics: Denies new lymphadenopathy or easy bruising Neurological:Denies numbness, tingling or new weaknesses Behavioral/Psych: Mood is stable, no new changes  Extremities: No lower extremity edema All other systems were reviewed with the patient and are negative.  I have reviewed the past medical history, past surgical history, social history and family history with the patient and they are unchanged from previous note.   PHYSICAL EXAMINATION: ECOG PERFORMANCE STATUS: 3 - Symptomatic, >50% confined to bed  Vitals:   11/01/18 1938 11/02/18 0553  BP: 92/72 125/63  Pulse: 92 (!) 103  Resp: 17   Temp: 97.9 F (36.6 C) 97.8 F (36.6 C)  SpO2: 99% 98%   Filed Weights   11/01/18 1938  Weight: 178 lb 9.2 oz (81 kg)    Intake/Output from previous day: 10/20 0701 - 10/21 0700 In: -  Out: 1 [Stool:1]  GENERAL:alert, no distress and comfortable SKIN: skin color, texture, turgor are normal, no rashes or significant lesions EYES: normal, Conjunctiva are pink and non-injected, sclera clear OROPHARYNX:no exudate, no erythema and lips, buccal mucosa, and tongue normal  NECK: supple, thyroid normal size, non-tender, without nodularity LYMPH:  no palpable lymphadenopathy in the cervical, axillary or inguinal LUNGS: clear to auscultation and percussion with normal breathing effort HEART: regular rate & rhythm and no murmurs and no lower extremity edema  ABDOMEN: Positive bowel sounds, mild distention.  No pain with palpation. Musculoskeletal:no cyanosis of digits and no clubbing.  Lower extremity strength 4/5 bilaterally. NEURO: alert & oriented x 3 with fluent speech, no focal motor/sensory deficits  LABORATORY DATA:  I have reviewed the data as listed CMP Latest Ref Rng & Units 11/02/2018 11/01/2018 10/31/2018  Glucose 70 - 99 mg/dL 106(H) 107(H) 144(H)  BUN 8 - 23 mg/dL 17 24(H) 18   Creatinine 0.44 - 1.00 mg/dL 0.56 0.67 0.63  Sodium 135 - 145 mmol/L 132(L) 131(L) 133(L)  Potassium 3.5 - 5.1 mmol/L 3.9 4.0 3.6  Chloride 98 - 111 mmol/L 101 98 101  CO2 22 - 32 mmol/L 23 24 23   Calcium 8.9 - 10.3 mg/dL 8.0(L) 8.1(L) 8.3(L)  Total Protein 6.5 - 8.1 g/dL - 4.9(L) 5.3(L)  Total Bilirubin 0.3 - 1.2 mg/dL - 1.4(H) 1.4(H)  Alkaline Phos 38 - 126 U/L - 446(H) 438(H)  AST 15 - 41 U/L - 78(H) 66(H)  ALT 0 - 44 U/L - 48(H) 47(H)    Lab Results  Component Value Date   WBC 9.0 11/02/2018   HGB 8.2 (L) 11/02/2018   HCT 27.4 (L) 11/02/2018   MCV 101.9 (H) 11/02/2018   PLT 430 (H) 11/02/2018   NEUTROABS 2.7 11/01/2018    Ct Abdomen Pelvis W Contrast  Result Date: 10/14/2018 CLINICAL DATA:  Follow-up metastatic pancreatic carcinoma. EXAM: CT ABDOMEN AND PELVIS WITH CONTRAST TECHNIQUE: Multidetector CT imaging of the abdomen and pelvis was performed using the standard protocol following bolus administration of intravenous contrast. CONTRAST:  114m OMNIPAQUE IOHEXOL 300 MG/ML  SOLN COMPARISON:  MRI on 07/01/2018 FINDINGS: Lower Chest: No acute findings. Hepatobiliary: Innumerable new low-attenuation masses are seen throughout the liver since prior study, consistent with diffuse liver metastases. Index lesion in the posterior right hepatic lobe measures 5.2 x 3.5 cm on image 35/8. New diffuse portal vein thrombosis is also seen since prior exam. Prior cholecystectomy again noted. Internal biliary stent is seen in place and there is no evidence of biliary ductal dilatation. Pancreas: Soft tissue mass in the pancreatic head currently measures 2.2 x 1.7 cm, compared to 3.0 x 2.9 cm when measured in same planes on prior study. Spleen: Within normal limits in size and appearance. Thrombosis of distal splenic vein is seen, which is new since prior study. Adrenals/Urinary Tract: No masses identified. No evidence of hydronephrosis. Stomach/Bowel: Gastric lap band remains in place. No evidence of  obstruction, inflammatory process or abnormal fluid collections. Vascular/Lymphatic: New mild lymphadenopathy in the portacaval and peripancreatic regions, with index lymph node in the portacaval space measuring 17 mm short axis on image 43/8. No abdominal aortic aneurysm. Reproductive:  No mass or other significant abnormality. Other:  None. Musculoskeletal:  No suspicious bone lesions identified. IMPRESSION: Marked increase in diffuse liver metastases. New mild portacaval and peripancreatic lymphadenopathy, consistent with metastatic disease. Mild decrease in size of pancreatic head mass. Diffuse portal vein thrombosis, new since prior exam. These results will be called to the ordering clinician or representative by the Radiologist Assistant, and communication documented in the PACS or zVision Dashboard. Electronically Signed   By: JMarlaine HindM.D.   On: 10/14/2018 15:58    ASSESSMENT AND PLAN: 1.  Metastatic pancreatic adenocarcinoma with liver metastases 2.  Fatigue, weakness, failure to thrive 3.  Dysuria/? Urinary retention 4.  Liver thrombosis 5.  Anemia 6.  Goals of care, DNR/DNI  -The patient had disease progression noted on her CT scan from 10/14/2018.  Her chemotherapy regimen was changed to gemcitabine and Abraxane 2 weeks on/1 week off which was started on 10/21/2018.  She received 1 dose and tolerated it poorly.  She was given a break from chemotherapy with plan for reevaluation on 11/07/2018.  Chemotherapy will remain on hold. -We have had previous discussions with the patient and her husband regarding her poor prognosis and she has been open to hospice in the past.  This was again discussed with the patient today.  She remains open to the idea of hospice, but would like to talk with her husband more about this.  We will continue ongoing discussions regarding this.  Palliative care team has been consulted for goals of care discussion. -Recommend bladder scan for ?  Urinary retention.   Agree with UA and urine culture.  May need to consider Foley catheter. -Continue as needed pain medication and anticoagulation.    LOS: 0 days   Mikey Bussing, DNP, AGPCNP-BC, AOCNP 11/02/18  Addendum  I have seen the patient, examined her. I agree with the assessment and and plan and have edited the notes.   I reviewed her CT scan from yesterday, which unfortunately showed significant cancer progression, and new portal thrombosis.  Clinically, she has declined rapidly in the past few weeks, failure to survive, not able to walk independently, urinary incontinent.  I recommend hospice care and she agrees.  Life expectancy is probably a few weeks, she is not able to liver independently, and her husband works. Will call hospice service to see if she can be transferred to residential hospice [facility].  Patient agrees with plan.  For her comfort abdomen peripheral IV in the left elbow, and insert Foley catheter for her comfort.  She is on antibiotics for UTI.  Continue pain management and supportive care. I spoke with her nurse and Dr. [place]   I will be out of office from tomorrow until next Wednesday. Will let my NP Erasmo Downer follow along. I appreciate the excellent care from hospitalist team.   Truitt Merle  11/02/2018

## 2018-11-02 NOTE — Progress Notes (Signed)
PT Cancellation Note  Patient Details Name: Taylor Oconnor MRN: [id number] DOB: 1950/04/28   Cancelled Treatment:    Reason Eval/Treat Not Completed: Patient declined, no reason specified  Patient resting in bed upon PT arrival. She reports she is too tired and worn out from her AM session with OT. She declined to participate in therapy and requested therapist come back at later time today if possible. Will attempt again at later date/time as schedule allows.  Kipp Brood, PT, DPT Physical Therapist with [facility]  11/02/2018 1:09 PM

## 2018-11-02 NOTE — Progress Notes (Signed)
   11/02/18 1547  MEWS Score  Resp 18  Pulse Rate (!) 115  BP 117/70  Temp 98.2 F (36.8 C)  SpO2 95 %  O2 Device Room Air  MEWS Score  MEWS RR 0  MEWS Pulse 2  MEWS Systolic 0  MEWS LOC 0  MEWS Temp 0  MEWS Score 2  MEWS Score Color Yellow  MEWS Assessment  Is this an acute change? Yes  MEWS guidelines implemented *See [place]  Provider Notification  Provider Name/Title Dr [place]  Date Provider Notified 11/02/18  Notification Type Page  Notification Reason Change in status  Response No new orders (no new orders recieved yet)

## 2018-11-02 NOTE — Evaluation (Signed)
Occupational Therapy Evaluation Patient Details Name: Taylor Oconnor MRN: [id number] DOB: July 29, 1950 Today's Date: 11/02/2018    History of Present Illness : Taylor Oconnor is a 68 y.o. female with medical history significant of breast cancer, pancreatic cancer diabetes mellitus, obesity, gastric banding.  Patient presented secondary to multiple week history of progressive weakness.  Symptoms were very bad after last chemotherapy treatment.  She reports being so weak that her husband has to carry her at times.  Her biggest issue is getting up, but when she is up she is usually able to move around with a walker.  She reports some decreased appetite which is caused her to eat less.  She reports a little bit of nausea but no vomiting.  Her abdominal pain is chronic and stable.  She has had some issues with impaction for which she was disimpacted a few days ago in the emergency department and has now resultant loose stools.   Clinical Impression   Pt admitted with weakness. Pt currently with functional limitations due to the deficits listed below (see OT Problem List).  Pt will benefit from skilled OT to increase their safety and independence with ADL and functional mobility for ADL to facilitate discharge to venue listed below.   Pt  Very willing to work with OT and verbalized she wanted to get stronger so husband would not have to carry her around.  Pt did well with OT bit did have accident  Upon standing in which frustrated her.  After accident and clean up she was fatigued.     Follow Up Recommendations  Home health OT;Supervision/Assistance - 24 hour    Equipment Recommendations  3 in 1 bedside commode    Recommendations for Other Services       Precautions / Restrictions Precautions Precautions: Fall      Mobility Bed Mobility Overal bed mobility: Needs Assistance Bed Mobility: Supine to Sit     Supine to sit: Min assist        Transfers Overall transfer level: Needs  assistance Equipment used: Rolling walker (2 wheeled) Transfers: Sit to/from [facility] Sit to Stand: Min assist Stand pivot transfers: Min assist       General transfer comment: increased time and encouragement.        ADL either performed or assessed with clinical judgement   ADL Overall ADL's : Needs assistance/impaired Eating/Feeding: Set up;Sitting   Grooming: Set up;Sitting   Upper Body Bathing: Set up;Sitting   Lower Body Bathing: Moderate assistance;Sit to/from stand;Cueing for safety;Cueing for compensatory techniques   Upper Body Dressing : Set up;Sitting   Lower Body Dressing: Moderate assistance;Sit to/from stand;Cueing for safety;Cueing for compensatory techniques   Toilet Transfer: Minimal assistance;RW;BSC;Stand-pivot;Cueing for safety;Cueing for sequencing   Toileting- Clothing Manipulation and Hygiene: Sit to/from stand;Cueing for safety;Moderate assistance;Cueing for compensatory techniques         General ADL Comments: pt very agreeable to OT .Upon standing pt did have accident on floor.  OT, CNA and RN Aed pt with cleanup and getting to chair.  When pt stood this happened again. Husband is bring pt depends this day. Pt very frustrated with this happening     Vision Patient Visual Report: No change from baseline              Pertinent Vitals/Pain Pain Assessment: No/denies pain     Hand Dominance     Extremity/Trunk Assessment Upper Extremity Assessment Upper Extremity Assessment: Generalized weakness  Communication     Cognition Arousal/Alertness: Awake/alert Behavior During Therapy: WFL for tasks assessed/performed Overall Cognitive Status: Within Functional Limits for tasks assessed                                                Home Living Family/patient expects to be discharged to:: Private residence Living Arrangements: Spouse/significant other Available Help at Discharge:  Family;Available 24 hours/day Type of Home: House                                           OT Problem List: Decreased strength;Decreased activity tolerance;Impaired balance (sitting and/or standing);Decreased knowledge of use of DME or AE      OT Treatment/Interventions: Self-care/ADL training;DME and/or AE instruction;Patient/family education;Therapeutic activities    OT Goals(Current goals can be found in the care plan section) Acute Rehab OT Goals Patient Stated Goal: get stronger OT Goal Formulation: With patient Time For Goal Achievement: 11/09/18 Potential to Achieve Goals: Good ADL Goals Pt Will Perform Lower Body Bathing: with supervision;sit to/from stand Pt Will Perform Lower Body Dressing: with supervision;sit to/from stand Pt Will Transfer to Toilet: with supervision;bedside commode;regular height toilet;ambulating Pt Will Perform Toileting - Clothing Manipulation and hygiene: with supervision;sit to/from stand  OT Frequency: Min 2X/week    AM-PAC OT "6 Clicks" Daily Activity     Outcome Measure Help from another person eating meals?: A Little Help from another person taking care of personal grooming?: A Little Help from another person toileting, which includes using toliet, bedpan, or urinal?: A Lot Help from another person bathing (including washing, rinsing, drying)?: A Little Help from another person to put on and taking off regular upper body clothing?: A Lot Help from another person to put on and taking off regular lower body clothing?: A Little 6 Click Score: 16   End of Session Equipment Utilized During Treatment: Rolling walker Nurse Communication: Mobility status  Activity Tolerance: Patient tolerated treatment well Patient left: in chair;with call bell/phone within reach;with nursing/sitter in room  OT Visit Diagnosis: Unsteadiness on feet (R26.81);Other abnormalities of gait and mobility (R26.89);Muscle weakness (generalized)  (M62.81);History of falling (Z91.81)                Time: 1020-1054 OT Time Calculation (min): 34 min Charges:  OT General Charges $OT Visit: 1 Visit OT Evaluation $OT Eval Moderate Complexity: 1 Mod OT Treatments $Self Care/Home Management : 8-22 mins  Kari Baars, OT Acute Rehabilitation Services [phone] Office- [phone], Edwena Felty D 11/02/2018, 2:38 PM

## 2018-11-03 DIAGNOSIS — R531 Weakness: Secondary | ICD-10-CM

## 2018-11-03 DIAGNOSIS — R627 Adult failure to thrive: Principal | ICD-10-CM

## 2018-11-03 DIAGNOSIS — Z515 Encounter for palliative care: Secondary | ICD-10-CM | POA: Diagnosis not present

## 2018-11-03 DIAGNOSIS — C799 Secondary malignant neoplasm of unspecified site: Secondary | ICD-10-CM | POA: Diagnosis not present

## 2018-11-03 DIAGNOSIS — R52 Pain, unspecified: Secondary | ICD-10-CM

## 2018-11-03 LAB — BASIC METABOLIC PANEL
Anion gap: 6 (ref 5–15)
BUN: 14 mg/dL (ref 8–23)
CO2: 24 mmol/L (ref 22–32)
Calcium: 8.1 mg/dL — ABNORMAL LOW (ref 8.9–10.3)
Chloride: 102 mmol/L (ref 98–111)
Creatinine, Ser: 0.5 mg/dL (ref 0.44–1.00)
GFR calc Af Amer: >60 mL/min (ref >60)
GFR calc non Af Amer: >60 mL/min (ref >60)
Glucose, Bld: 110 mg/dL — ABNORMAL HIGH (ref 70–99)
Potassium: 3.9 mmol/L (ref 3.5–5.1)
Sodium: 132 mmol/L — ABNORMAL LOW (ref 135–145)

## 2018-11-03 LAB — CBC
HCT: 26.8 % — ABNORMAL LOW (ref 36.0–46.0)
Hemoglobin: 8.2 g/dL — ABNORMAL LOW (ref 12.0–15.0)
MCH: 31.2 pg (ref 26.0–34.0)
MCHC: 30.6 g/dL (ref 30.0–36.0)
MCV: 101.9 fL — ABNORMAL HIGH (ref 80.0–100.0)
Platelets: 444 10*3/uL — ABNORMAL HIGH (ref 150–400)
RBC: 2.63 MIL/uL — ABNORMAL LOW (ref 3.87–5.11)
RDW: 18.9 % — ABNORMAL HIGH (ref 11.5–15.5)
WBC: 14.3 10*3/uL — ABNORMAL HIGH (ref 4.0–10.5)
nRBC: 3.8 % — ABNORMAL HIGH (ref 0.0–0.2)

## 2018-11-03 LAB — MAGNESIUM: Magnesium: 2.1 mg/dL (ref 1.7–2.4)

## 2018-11-03 MED ORDER — MORPHINE SULFATE (PF) 2 MG/ML IV SOLN
1.0000 mg | INTRAVENOUS | Status: DC | PRN
  Administered 2018-11-03 – 2018-11-05 (×13): 2 mg via INTRAVENOUS
  Administered 2018-11-05: 1 mg via INTRAVENOUS
  Administered 2018-11-06 – 2018-11-07 (×2): 2 mg via INTRAVENOUS
  Filled 2018-11-03 (×16): qty 1

## 2018-11-03 NOTE — Progress Notes (Signed)
Manufacturing engineer [facility])  Received referral from [employer], for residential hospice at [facility].  Spoke with Mr. Lofty, he is in agreement with whatever his wife would want.  Answered questions and provided support.   [facility] does not have a bed to offer today.  ACC will follow up with [facility] manager and family once bed becomes available.  Thank you, Venia Carbon RN, BSN, [facility] Liaison (in [place]) [phone]

## 2018-11-03 NOTE — TOC Initial Note (Signed)
Transition of Care [facility]) - Initial/Assessment Note    Patient Details  Name: Taylor Oconnor MRN: [id number] Date of Birth: 07/29/1950  Transition of Care [facility]) CM/SW Contact:    Nila Nephew, LCSW Phone Number: [phone] 11/03/2018, 11:20 AM  Clinical Narrative:             Pt requesting referral to hospice given decision to pursue comfort care. [place] desired. CSW made referral, will follow.      Expected Discharge Plan: [place] Barriers to Discharge: Hospice Bed not available(also pending hospice eligibility confirmation)   Patient Goals and CMS Choice Patient states their goals for this hospitalization and ongoing recovery are:: comfort care CMS Medicare.gov Compare Post Acute Care list provided to:: Patient Choice offered to / list presented to : Patient  Expected Discharge Plan and Services Expected Discharge Plan: [place]       Living arrangements for the past 2 months: Single Family Home                                      Prior Living Arrangements/Services Living arrangements for the past 2 months: Single Family Home Lives with:: Spouse Patient language and need for interpreter reviewed:: Yes Do you feel safe going back to the place where you live?: Yes      Need for Family Participation in Patient Care: Yes (Comment) Care giver support system in place?: Yes (comment)   Criminal Activity/Legal Involvement Pertinent to Current Situation/Hospitalization: No - Comment as needed  Activities of Daily Living Home Assistive Devices/Equipment: Eyeglasses, Environmental consultant (specify type)(4 wheeled walker) ADL Screening (condition at time of admission) Patient's cognitive ability adequate to safely complete daily activities?: Yes Is the patient deaf or have difficulty hearing?: No Does the patient have difficulty seeing, even when wearing glasses/contacts?: No Does the patient have difficulty concentrating, remembering, or  making decisions?: No Patient able to express need for assistance with ADLs?: Yes Does the patient have difficulty dressing or bathing?: Yes Independently performs ADLs?: No Communication: Independent Dressing (OT): Needs assistance Is this a change from baseline?: Change from baseline, expected to last >3 days Grooming: Needs assistance Is this a change from baseline?: Change from baseline, expected to last >3 days Feeding: Needs assistance Is this a change from baseline?: Change from baseline, expected to last >3 days Bathing: Needs assistance Is this a change from baseline?: Change from baseline, expected to last >3 days Toileting: Needs assistance Is this a change from baseline?: Change from baseline, expected to last >3days In/Out Bed: Needs assistance Is this a change from baseline?: Change from baseline, expected to last >3 days Walks in Home: Needs assistance Is this a change from baseline?: Change from baseline, expected to last >3 days Does the patient have difficulty walking or climbing stairs?: Yes(secondary to weakness) Weakness of Legs: Both Weakness of Arms/Hands: Both  Permission Sought/Granted Permission sought to share information with : Family Supports, Other (comment)    Share Information with NAME: husband [phone], Mortimer Fries  Permission granted to share info w AGENCY: [facility]        Admission diagnosis:  Generalized weakness [R53.1] Intractable diarrhea [R19.7] 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 Palliative care by specialist   . Adult failure to thrive   . Intractable pain 11/02/2018  . 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  Generalized weakness 11/01/2018  . Portal vein thrombosis 10/17/2018  . Port-A-Cath in place 08/02/2018  . Terminal care 07/26/2018  . Family history of breast cancer   . Pancreatic cancer ([place]) 07/14/2018  . Anxiety 12/01/2016  . Prediabetes  11/30/2016  . Malignant neoplasm of upper-outer quadrant of right breast in female, estrogen receptor positive ([place]) 12/03/2014  . Essential hypertension 12/14/2012  . Hyperlipidemia, mixed 12/14/2012  . Vitamin D deficiency 12/14/2012  . Morbid obesity ([place]) 12/14/2012  . 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

## 2018-11-03 NOTE — Progress Notes (Signed)
Occupational Therapy Treatment Patient Details Name: Taylor Oconnor MRN: [id number] DOB: 1950-05-28 Today's Date: 11/03/2018    History of present illness : Taylor Oconnor is a 68 y.o. female with medical history significant of breast cancer, pancreatic cancer diabetes mellitus, obesity, gastric banding.  Patient presented secondary to multiple week history of progressive weakness.  Symptoms were very bad after last chemotherapy treatment.  She reports being so weak that her husband has to carry her at times.  Her biggest issue is getting up, but when she is up she is usually able to move around with a walker.  She reports some decreased appetite which is caused her to eat less.  She reports a little bit of nausea but no vomiting.  Her abdominal pain is chronic and stable.  She has had some issues with impaction for which she was disimpacted a few days ago in the emergency department and has now resultant loose stools.   OT comments  Pt in good spirits this day.  Pt did share with OT plan may be [employer]. Pt stated she and her husband has a lot to talk about    Follow Up Recommendations  Home health OT;Supervision/Assistance - 24 hour    Equipment Recommendations  3 in 1 bedside commode    Recommendations for Other Services      Precautions / Restrictions Precautions Precautions: Fall       Mobility Bed Mobility Overal bed mobility: Needs Assistance Bed Mobility: Supine to Sit     Supine to sit: Min assist        Transfers Overall transfer level: Needs assistance Equipment used: 1 person hand held assist Transfers: Sit to/from [facility] Sit to Stand: Min assist Stand pivot transfers: Min assist            Balance Overall balance assessment: Needs assistance Sitting-balance support: No upper extremity supported Sitting balance-Leahy Scale: Good     Standing balance support: Single extremity supported Standing balance-Leahy Scale: Poor                             ADL either performed or assessed with clinical judgement   ADL Overall ADL's : Needs assistance/impaired Eating/Feeding: Set up;Sitting   Grooming: Standing;Set up               Lower Body Dressing: Minimal assistance;Sit to/from stand;Cueing for safety;Cueing for compensatory techniques;Cueing for sequencing   Toilet Transfer: RW;Stand-pivot;Cueing for safety;Cueing for sequencing;Min guard Toilet Transfer Details (indicate cue type and reason): bed to chair Toileting- Clothing Manipulation and Hygiene: Sit to/from stand;Cueing for safety;Cueing for compensatory techniques;Minimal assistance         General ADL Comments: pt in good spiritts this morning.  Pt did share with OT she was likely going to [facility].  Pt does not feel husband can care for her as she needs     Vision Patient Visual Report: No change from baseline            Cognition Arousal/Alertness: Awake/alert Behavior During Therapy: WFL for tasks assessed/performed Overall Cognitive Status: Within Functional Limits for tasks assessed                                                     Pertinent Vitals/ Pain  Pain Assessment: No/denies pain         Frequency  Min 2X/week        Progress Toward Goals  OT Goals(current goals can now be found in the care plan section)  Progress towards OT goals: Progressing toward goals     Plan Discharge plan remains appropriate    Co-evaluation                 AM-PAC OT "6 Clicks" Daily Activity     Outcome Measure   Help from another person eating meals?: A Little Help from another person taking care of personal grooming?: A Little Help from another person toileting, which includes using toliet, bedpan, or urinal?: A Little Help from another person bathing (including washing, rinsing, drying)?: A Little Help from another person to put on and taking off regular upper body  clothing?: A Little Help from another person to put on and taking off regular lower body clothing?: A Little 6 Click Score: 18    End of Session    OT Visit Diagnosis: Unsteadiness on feet (R26.81);Other abnormalities of gait and mobility (R26.89);Muscle weakness (generalized) (M62.81);History of falling (Z91.81)   Activity Tolerance Patient tolerated treatment well   Patient Left in chair;with call bell/phone within reach   Nurse Communication Mobility status        Time: [id number] OT Time Calculation (min): 31 min  Charges: OT General Charges $OT Visit: 1 Visit OT Treatments $Self Care/Home Management : 23-37 mins  Kari Baars, [place] [phone] Office- [place], Edwena Felty D 11/03/2018, 10:00 AM

## 2018-11-03 NOTE — Evaluation (Signed)
Physical Therapy Evaluation Patient Details Name: Taylor Oconnor MRN: [id number] DOB: [birth year] Today's Date: 11/03/2018   History of Present Illness  : Taylor Oconnor is a 68 y.o. female with medical history significant of breast cancer, pancreatic cancer diabetes mellitus, obesity, gastric banding.  Patient presented secondary to multiple week history of progressive weakness.  Symptoms were very bad after last chemotherapy treatment.  She reports being so weak that her husband has to carry her at times.  Her biggest issue is getting up, but when she is up she is usually able to move around with a walker.  She reports some decreased appetite which is caused her to eat less.  She reports a little bit of nausea but no vomiting.  Her abdominal pain is chronic and stable.  She has had some issues with impaction for which she was disimpacted a few days ago in the emergency department and has now resultant loose stools.    Clinical Impression  Taylor Oconnor is 67 y.o. female admitted with above HPI and diagnosis. Patient is currently limited by functional impairments below (see PT problem list). Patient was agreeable to work with PT and does wish to improve her strength. She was able to perform multiple sit<>stands with min assist and attempted short distance of walking in room however was very fatigued after several small steps. Patient will benefit from continued skilled PT interventions to address impairments and progress independence with mobility, recommending SNF. Acute PT will follow and progress as able.     Follow Up Recommendations SNF    Equipment Recommendations  None recommended by PT    Recommendations for Other Services       Precautions / Restrictions Precautions Precautions: Fall Restrictions Weight Bearing Restrictions: No      Mobility  Bed Mobility Overal bed mobility: Needs Assistance Bed Mobility: Sit to Supine     Supine to sit: Min assist Sit to supine: Min  assist   General bed mobility comments: cues for sequencing and assist to raise LE's into bed fully  Transfers Overall transfer level: Needs assistance Equipment used: Rolling walker (2 wheeled) Transfers: Sit to/from [facility] Sit to Stand: Min assist Stand pivot transfers: Min assist       General transfer comment: cues for hand placement and technique with RW, pt able to initiate power up and rise without assist. Pt performed sit<>stand from recliner 1x, and stand pivot from recliner to bed to after taking ~ 4 small steps forward in hospital room to attempt ambulation. Pt limited by fatigue. After transfering to bed she performed 3x sit<>stand form EOB to clean up loose BM and requried rest breaks as she could not stand for >20-30 seconds at a time.  Ambulation/Gait   Gait Distance (Feet): 4 Feet     Gait velocity: slow and labored   General Gait Details: short small steps taken with RW and assist for safe use of RW; pt extremely limited by weakness and fatigue  Stairs            Wheelchair Mobility    Modified Rankin (Stroke Patients Only)       Balance Overall balance assessment: Needs assistance Sitting-balance support: No upper extremity supported Sitting balance-Leahy Scale: Good     Standing balance support: Bilateral upper extremity supported;During functional activity Standing balance-Leahy Scale: Poor  Pertinent Vitals/Pain Pain Assessment: 0-10 Pain Score: 5  Pain Location: Rt abdomen radiating into back Pain Descriptors / Indicators: Aching Pain Intervention(s): Limited activity within patient's tolerance;Monitored during session;Repositioned;Patient requesting pain meds-RN notified    Home Living Family/patient expects to be discharged to:: Private residence Living Arrangements: Spouse/significant other Available Help at Discharge: Family;Available 24 hours/day Type of Home:  House Home Access: Stairs to enter Entrance Stairs-Rails: Adult nurse of Steps: 5 steps from driveway without railing and 3 more at porch with hand rail on Lt going in Home Layout: One level Home Equipment: Walker - 4 wheels(walking stick)      Prior Function Level of Independence: Needs assistance   Gait / Transfers Assistance Needed: pt reports her husband has been asssiting her to transfer to standing from sitting as her weakness has progressed  ADL's / Homemaking Assistance Needed: pts' husband helps her with bathing and dressed, he also helped with all home making        Hand Dominance   Dominant Hand: Right    Extremity/Trunk Assessment   Upper Extremity Assessment Upper Extremity Assessment: Defer to OT evaluation    Lower Extremity Assessment Lower Extremity Assessment: Generalized weakness    Cervical / Trunk Assessment Cervical / Trunk Assessment: Normal  Communication   Communication: No difficulties  Cognition Arousal/Alertness: Awake/alert Behavior During Therapy: WFL for tasks assessed/performed Overall Cognitive Status: Within Functional Limits for tasks assessed             General Comments      Exercises     Assessment/Plan    PT Assessment Patient needs continued PT services  PT Problem List Decreased strength;Decreased range of motion;Decreased mobility;Decreased balance;Decreased activity tolerance;Decreased knowledge of use of DME       PT Treatment Interventions DME instruction;Functional mobility training;Balance training;Patient/family education;Modalities;Neuromuscular re-education;Therapeutic activities;Gait training;Stair training;Therapeutic exercise    PT Goals (Current goals can be found in the Care Plan section)  Acute Rehab PT Goals Patient Stated Goal: get stronger PT Goal Formulation: With patient Time For Goal Achievement: 11/17/18 Potential to Achieve Goals: Good    Frequency Min 2X/week     AM-PAC PT "6 Clicks" Mobility  Outcome Measure Help needed turning from your back to your side while in a flat bed without using bedrails?: A Little Help needed moving from lying on your back to sitting on the side of a flat bed without using bedrails?: A Little Help needed moving to and from a bed to a chair (including a wheelchair)?: A Little Help needed standing up from a chair using your arms (e.g., wheelchair or bedside chair)?: A Little Help needed to walk in hospital room?: A Lot Help needed climbing 3-5 steps with a railing? : Total 6 Click Score: 15    End of Session Equipment Utilized During Treatment: Gait belt Activity Tolerance: Patient limited by fatigue Patient left: in bed;with bed alarm set;with call bell/phone within reach Nurse Communication: Mobility status PT Visit Diagnosis: Muscle weakness (generalized) (M62.81);Difficulty in walking, not elsewhere classified (R26.2)    Time: [id number] PT Time Calculation (min) (ACUTE ONLY): 30 min   Charges:   PT Evaluation $PT Eval Moderate Complexity: 1 Mod PT Treatments $Therapeutic Activity: 8-22 mins       Kipp Brood, PT, DPT Physical Therapist with [facility]  11/03/2018 11:26 AM

## 2018-11-03 NOTE — Progress Notes (Signed)
PROGRESS NOTE  Taylor Oconnor [id number] DOB: 09-11-1950 DOA: 11/01/2018 PCP: Unk Pinto, MD  Brief History   Conswello Pevey Priceis a 68 y.o.femalewith medical history significant ofbreast cancer, pancreatic cancer diabetes mellitus, obesity, gastric banding.Patient presented secondary to multiple week history of progressive weakness. Symptoms were very bad after last chemotherapy treatment. She reports being so weak that her husband has to carry her at times. Her biggest issue is getting up, but when she is up she is usually able to move around with a walker. She reports some decreased appetite which is caused her to eat less. She reports a little bit of nausea but no vomiting. Her abdominal pain is chronic and stable. She has had some issues with impaction for which she was disimpacted a few days ago in the emergency department and has now resultant loose stools.  ED Course:Afebrile, normal pulse, respirations of 21/min, normotensive, on room air. Sodium 131, BUN 24, magnesium 2.7, AST/ALT 78/48 respectively, alkaline phosphatase of 446, hemoglobin of 8.5 Imaging:None obtained. Medications/Course:IV fluids, Zofran, morphine. EDP referred to [facility] for admission with profound weakness, hyponatremia, intractable pain of malignancy, and adult failure to thrive.  Consultants  . Palliative care  Procedures  . None  Antibiotics   Anti-infectives (From admission, onward)   Start     Dose/Rate Route Frequency Ordered Stop   11/02/18 1600  cefTRIAXone (ROCEPHIN) 1 g in sodium chloride 0.9 % 100 mL IVPB     1 g 200 mL/hr over 30 Minutes Intravenous Every 24 hours 11/02/18 1522 11/05/18 1559    .  Marland Kitchen   Subjective  The patient is sitting up at bedside enjoying breakfast. No new complaints.  Objective   Vitals:  Vitals:   11/03/18 0801 11/03/18 1159  BP: (!) 116/58 106/65  Pulse: 88 97  Resp: 19 18  Temp: 97.6 F (36.4 C) 97.7 F (36.5 C)  SpO2: 96% 97%   Exam:   Constitutional:  . The patient is awake, alert, and oriented x 3. No acute distress. Chronically ill appearing. Respiratory:  . No increased work of breathing. . No wheezes, rales, or rhonchi . No tactile fremitus Cardiovascular:  . Regular rate and rhythm . No murmurs, ectopy, or gallups. . No lateral PMI. No thrills. Abdomen:  . Abdomen is soft,  non-distended . There is mild epigastric tenderness to palpation . No hernias, masses, or organomegaly . Normoactive bowel sounds.  Musculoskeletal:  . No cyanosis, clubbing, or edema Skin:  . No rashes, lesions, ulcers . palpation of skin: no induration or nodules Neurologic:  . CN 2-12 intact . Sensation all 4 extremities intact Psychiatric:  . Mental status o Mood, affect appropriate o Orientation to person, place, time  . judgment and insight appear intact  I have personally reviewed the following:   Today's Data  . Vitals, BMP, CBC  Scheduled Meds: . anastrozole  1 mg Oral Daily  . atenolol  50 mg Oral QPC supper  . Chlorhexidine Gluconate Cloth  6 each Topical Daily  . docusate sodium  100 mg Oral BID  . feeding supplement (ENSURE ENLIVE)  237 mL Oral BID BM  . multivitamin with minerals  1 tablet Oral Daily  . polyethylene glycol  17 g Oral Daily  . Rivaroxaban  15 mg Oral BID  . rosuvastatin  10 mg Oral Daily  . traZODone  150 mg Oral QHS   Continuous Infusions: . sodium chloride 75 mL/hr at 11/03/18 1452  . 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 Does not appear to have immediately reversible causes. Complicated by poor oral intake from  poor appetite. Continue supportive care, IV fluid hydration w/ NS @ 38mL per hour. Plan is for the patient to discharge to residential hospice.  Abdominal pain, Intractable pain of malignancy: Secondary to metastatic pancreatic cancer. Patient is on chronic pain regimen as an outpatient for which she is recently held secondary to issues with constipation and impaction. She has been using tramadolmore recently instead of her OxyContin. Continue oxycodone and tramadol prn as well as morphine 1mg  q3h prn for breakthrough pain not relieved with oral medication. Plan is for the patient to discharge to residential hospice when a bed becomes available.  Urinary retention, Dysuria: Patient reports difficulty urinating with associated pain.  Patient is afebrile without leukocytosis.  Urinalysis notable for negative leukoesterase, positive nitrite, many bacteria.  Bladder scan with greater than 350 retained urine.  Status post in and out catheterization. She is receiving ceftriaxone for UTI. Urine culture has grown out E. Coli. Blood cultures x2: have shown no growth x 48  Hours. Foley catheter placed for continued urinary retention.  Hyponatremia, likely secondary to hypovolemia: Sodium 131 on admission.  Etiology likely secondary to poor oral intake and hypovolemic hyponatremia. Continue IV fluid hydration with NS at 75 mL's per hour. Monitor.  Pancreatic cancer: Patient with associated diffuse metastasis to liver. Patient is currently undergoing palliative chemotherapy as an outpatient.   Breast cancer: Patient is status post right lumpectomy and adjuvant radiation therapy. She is currently on anastrozole as an outpatient. Continue anastrozole  Poor appetite: Patient with nausea, no vomiting and no pain; which is prohibiting oral intake. Per primary oncology note, she was recommended to drink protein supplements and was referred for dietician evaluation. Continue to encourage increased oral intake.  Nutrition consult appreciated. Continue supportive care, antiemetics.  Liver thrombosis: Continue Xarelto  Chronic anemia: Stable.  Hemoglobin 8.2  Essential hypertension: Normotensive on home dose of atenolol. Verapamil held.  Elevated liver enzymes: Noted and chronic.  Insomnia: Continue Trazodone  I have seen and examined this patient myself. I have spent 34 minutes in her evaluation and care.  DVT prophylaxis: Lovenox Code Status: DNR Family Communication: none Disposition Plan: Discharge to residential hospice when a bed becomes available.  Raychell Holcomb, DO [facility] Direct contact: see [email or web address]  7PM-7AM contact night coverage as above 11/03/2018, 5:21 PM  LOS: 1 day

## 2018-11-03 NOTE — Consult Note (Signed)
Consultation Note Date: 11/03/2018   Patient Name: Taylor Oconnor  DOB: September 28, 1950  MRN: [id number]  Age / Sex: 68 y.o., female  PCP: Unk Pinto, MD Referring Physician: Karie Kirks, DO  Reason for Consultation: Establishing goals of care  HPI/Patient Profile: 68 y.o. female  with past medical history of breast cancer, metastatic pancreatic cancer, diabetes mellitus, obesity, gastric banding admitted on 11/01/2018 with progressive weakness and decreased oral intake. Abdominal pain is chronic. She reports issues with impaction which she was disimpacted a few days ago in ED. Followed by Dr. Burr Medico. Repeat CT scan on 10/14/18 revealed disease progression. Her chemotherapy regimen was changed to gemcitabine and Abraxane 2 weeks on/one 1 week off which was started 10/21/18. Patient received once dose and responded poorly. Oncology following inpatient to discuss poor prognosis and recommendation for residential hospice placement. Palliative medicine consultation for goals of care.   Clinical Assessment and Goals of Care:  I have reviewed medical records, discussed with care team, and met with patient at bedside to discuss diagnosis, prognosis, GOC, EOL wishes, disposition and options. Patient is awake, alert, oriented and able to participate in discussion.   Introduced Palliative Medicine as specialized medical care for people living with serious illness. It focuses on providing relief from the symptoms and stress of a serious illness.   Reviewed patient's unfortunate history of breast cancer and newly diagnosed pancreatic cancer this June.   Discussed events leading up to admission and course of hospitalization including diagnoses, interventions, and plan of care. Patient appreciates visits from oncology NP and Dr. Burr Medico yesterday. Her husband was present during this meeting and understands disease progression, poor  tolerance to chemotherapy, poor prognosis, and recommendation for comfort/hospice services.   Patient tells me she wishes to go to [facility] for EOL care. She is at peace with this decision. She states "I'm at peace with the Reita Cliche" and "at peace with afterlife." She is most worried about leaving her husband of 40+ years. She has one daughter, who lives in [place]. She wishes for her daughter to visit but also shares her daughter has been very emotional and [name] doesn't want to deal with 'drama' during her final days.   Patient confirms her decision for DNR code status. Her husband understands and respects this decision. Explained that she will receive durable DNR form.   Spent time discussing residential hospice placement and hospice philosophy. Emphasized that she will NOT continue aggressive IVF, antibiotics, or medications not aimed at comfort at hospice facility. Emphasized goal of comfort, quality of life, and peace/dignity at EOL. Educated on EOL expectations.   Discussed symptom management medications. Morphine 65m is easing off the pain but is not lasting long. Patient agreeable for morphine dose to be increased. We discussed it may need to be scheduled if pain is still not controlled. She was able to sleep well with trazodone last night. No BM since 10/20--continue to take miralax and colace. Denies nausea.  Appetite remains poor. She will only take bites of eggs.  Questions and  concerns were addressed. PMT contact information given. Therapeutic listening and emotional/spiritual support provided.   **Updated SW that husband will be at the hospital this afternoon to further discuss hospice transfer.   SUMMARY OF RECOMMENDATIONS    DNR/DNI. No heroic measures at EOL.   SW following for disposition to residential hospice facility. Patient is requesting [facility]. She understands IVF, antibiotics, and medications not aimed at comfort will be discontinued when transferred.   Continue  foley catheter for urinary retention and EOL care.  Symptom management--see below.  Code Status/Advance Care Planning:  DNR  Symptom Management:   Increased Morphine 1-76m IV q2h prn pain/dyspnea/air hunger/tachypnea (Pt states minimal relief from 133mmorphine).  Oxycodone 2.5-1m86mO q6h prn moderate pain  Continue HS Trazadone  Continue scheduled Miralax and colace  Palliative Prophylaxis:   Bowel Regimen, Delirium Protocol, Frequent Pain Assessment and Turn Reposition  Psycho-social/Spiritual:   Desire for further Chaplaincy support:yes  Additional Recommendations: Caregiving  Support/Resources, Compassionate Wean Education and Education on Hospice  Prognosis:   Likely weeks with progressive metastatic pancreatic cancer to liver. Severe decline in functional/nutritional status. Ongoing cancer-related pain requiring IV morphine to maintain comfort.  Discharge Planning: Hospice facility      Primary Diagnoses: Present on Admission: . Essential hypertension . Morbid [place] ([place] [place] ([place] Intractable pain   I have reviewed the medical record, interviewed the patient and family, and examined the patient. The following aspects are pertinent.  Past Medical History:  Diagnosis Date  . Allergy    SEASONAL  . Anxiety   . At risk for difficult insertion of breathing tube   . Breast cancer of upper-outer quadrant of right female breast ([place]  . Common bile duct (CBD) obstruction   . 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 Family history of breast cancer   . GERD (gastroesophageal reflux disease)    hx - resolved with lap band surgery  . H/O hiatal hernia    resolved with band surgery  . H/O laparoscopic adjustable gastric banding 2010  . History of kidney stones 02/2018  . Hyperlipidemia   . Hypertension   . Hypokalemia   . Insomnia    tx with zanax  . Obesity   . Pancreatic mass   . PCOS (polycystic ovarian syndrome)    . Personal history of radiation therapy   . 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 Type II or unspecified type diabetes mellitus without mention of complication, not stated as uncontrolled 12/14/2012   no meds, diet controlled  . Wears contact lenses   . Wears glasses    Social History   Socioeconomic History  . Marital status: Married    Spouse name: Not on file  . Number of children: 1  . Years of education: Not on file  . Highest education level: Not on file  Occupational History  . Not on file  Social Needs  . Financial resource strain: Not on file  . Food insecurity    Worry: Not on file    Inability: Not on file  . Transportation needs    Medical: Not on file    Non-medical: Not on file  Tobacco Use  . Smoking status: Never Smoker  . Smokeless tobacco: Never Used  Substance and Sexual Activity  . Alcohol use: No    Alcohol/week: 0.0 standard drinks  . Drug use: No  . Sexual activity: Not Currently    Birth control/protection: Post-menopausal  Lifestyle  .  Physical activity    Days per week: Not on file    Minutes per session: Not on file  . Stress: Not on file  Relationships  . Social Herbalist on phone: Not on file    Gets together: Not on file    Attends religious service: Not on file    Active member of club or organization: Not on file    Attends meetings of clubs or organizations: Not on file    Relationship status: Not on file  Other Topics Concern  . Not on file  Social History Narrative  . Not on file   Family History  Problem Relation Age of Onset  . Cirrhosis Mother   . Alcohol abuse Mother   . Liver disease Mother   . Diabetes Father   . Hypertension Father   . Stroke Father   . Heart disease Father   . AAA (abdominal aortic aneurysm) Sister 32       twin sister  . Cancer Cousin        Breast  . Breast cancer Cousin        x3  . Colon cancer Neg Hx   . Rectal cancer Neg Hx   . Stomach cancer Neg Hx     Scheduled Meds: . anastrozole  1 mg Oral Daily  . atenolol  50 mg Oral QPC supper  . Chlorhexidine Gluconate Cloth  6 each Topical Daily  . docusate sodium  100 mg Oral BID  . feeding supplement (ENSURE ENLIVE)  237 mL Oral BID BM  . multivitamin with minerals  1 tablet Oral Daily  . polyethylene glycol  17 g Oral Daily  . Rivaroxaban  15 mg Oral BID  . rosuvastatin  10 mg Oral Daily  . traZODone  150 mg Oral QHS   Continuous Infusions: . sodium chloride 75 mL/hr at 11/02/18 2333  . cefTRIAXone (ROCEPHIN)  IV Stopped (11/02/18 1611)   PRN Meds:.morphine injection, ondansetron **OR** ondansetron (ZOFRAN) IV, oxyCODONE, prochlorperazine, traMADol Medications Prior to Admission:  Prior to Admission medications   Medication Sig Start Date End Date Taking? Authorizing Provider  anastrozole (ARIMIDEX) 1 MG tablet TAKE 1 TABLET BY MOUTH EVERY DAY Patient taking differently: Take 1 mg by mouth daily.  03/21/18  Yes Causey, Charlestine Massed, NP  atenolol (TENORMIN) 100 MG tablet TAKE 1 TABLET BY MOUTH EVERY DAY Patient taking differently: Take 50 mg by mouth daily after supper.  03/19/18  Yes Unk Pinto, MD  magic mouthwash SOLN Take 5 mLs by mouth 4 (four) times daily as needed for mouth pain. 10/28/18  Yes Truitt Merle, MD  nystatin (MYCOSTATIN) 100000 UNIT/ML suspension TAKE 5 ML BY MOUTH THREE TIMES DAILY AS DIRECTED Patient taking differently: Use as directed 5 mLs in the mouth or throat 3 (three) times daily as needed (irritation).  10/02/18  Yes Truitt Merle, MD  ondansetron (ZOFRAN ODT) 8 MG disintegrating tablet Take 1 tablet (8 mg total) by mouth every 8 (eight) hours as needed for nausea or vomiting. 10/17/18  Yes Truitt Merle, MD  oxyCODONE (OXY IR/ROXICODONE) 5 MG immediate release tablet 1 to 2 PO q 4 hours prn pain Patient taking differently: Take 2.5-5 mg by mouth every 6 (six) hours as needed for moderate pain or severe pain.  10/24/18  Yes Tanner, Lyndon Code., PA-C  prochlorperazine  (COMPAZINE) 10 MG tablet Take 10 mg by mouth every 6 (six) hours as needed for nausea or vomiting.   Yes [provider]  promethazine (PHENERGAN) 25 MG tablet Take 1/2-1 tab every 6 hours as needed for nausea. Patient taking differently: Take 25 mg by mouth at bedtime.  06/29/18  Yes Liane Comber, NP  Rivaroxaban 15 & 20 MG TBPK Follow package directions: Take one 61m tablet by mouth twice a day. On day 22, switch to one 275mtablet once a day. Take with food. Patient taking differently: Take 15 mg by mouth 2 (two) times daily. Follow package directions: Take one 1576mablet by mouth twice a day. On day 22, switch to one 64m30mblet once a day. Take with food. 10/17/18  Yes FengTruitt Merle  rosuvastatin (CRESTOR) 10 MG tablet Take 1 tablet Daily for Cholesterol Patient taking differently: Take 10 mg by mouth daily.  08/29/18  Yes McKeUnk Pinto  traMADol (ULTRAM) 50 MG tablet Take 1 tablet (50 mg total) by mouth every 6 (six) hours as needed. Patient taking differently: Take 50 mg by mouth every 6 (six) hours as needed for moderate pain.  09/26/18  Yes BurtAlla Feeling  traZODone (DESYREL) 150 MG tablet Take 1/2 to 1 tablet 1 to 2 hours before Bedtime Patient taking differently: Take 150 mg by mouth at bedtime.  06/01/18  Yes McKeUnk Pinto  verapamil (CALAN) 80 MG tablet Take 1 tablet 2 x /day with meal for BP Patient taking differently: Take 80 mg by mouth 2 (two) times daily with a meal.  02/14/18  Yes McKeUnk Pinto  lidocaine (XYLOCAINE) 2 % solution Use as directed 5 mLs in the mouth or throat every 3 (three) hours as needed for mouth pain. Patient not taking: Reported on 11/01/2018 08/02/18   TannHarle StanfordA-C   Allergies  Allergen Reactions  . Oxaliplatin Anaphylaxis  . Codeine Itching  . Lipitor [Atorvastatin] Other (See Comments)    Severe fatigue. ? memory loss.  . Ace Inhibitors Cough   Review of Systems  Constitutional: Positive for activity change  and appetite change.       Cancer related pain  Gastrointestinal: Positive for constipation.  Neurological: Positive for weakness.   Physical Exam Vitals signs and nursing note reviewed.  Constitutional:      General: She is awake.     Appearance: She is ill-appearing.  HENT:     Head: Normocephalic and atraumatic.  Pulmonary:     Effort: No tachypnea, accessory muscle usage or respiratory distress.  Skin:    General: Skin is warm and dry.     Coloration: Skin is pale.  Neurological:     Mental Status: She is alert and oriented to person, place, and time.  Psychiatric:        Mood and Affect: Mood normal.        Speech: Speech normal.        Behavior: Behavior normal.        Cognition and Memory: Cognition normal.     Vital Signs: BP (!) 116/58 (BP Location: Left Arm)   Pulse 88   Temp 97.6 F (36.4 C) (Oral)   Resp 19   Ht 5' 3" (1.6 m)   Wt 81 kg   SpO2 96%   BMI 31.63 kg/m  Pain Scale: 0-10   Pain Score: 2    SpO2: SpO2: 96 % O2 Device:SpO2: 96 % O2 Flow Rate: .   IO: Intake/output summary:   Intake/Output Summary (Last 24 hours) at 11/03/2018 1038 Last data filed at 11/03/2018 0958 Gross per 24 hour  Intake  2187.13 ml  Output 1251 ml  Net 936.13 ml    LBM: Last BM Date: 11/02/18 Baseline Weight: Weight: 81 kg Most recent weight: Weight: 81 kg     Palliative Assessment/Data: PPS 40%   Flowsheet Rows     Most Recent Value  Intake Tab  Referral Department  Hospitalist  Unit at Time of Referral  Med/Surg Unit  Palliative Care Primary Diagnosis  Cancer  Palliative Care Type  New Palliative care  Date first seen by Palliative Care  11/03/18  Clinical Assessment  Palliative Performance Scale Score  40%  Psychosocial & Spiritual Assessment  Palliative Care Outcomes  Patient/Family meeting held?  Yes  Who was at the meeting?  patient  Palliative Care Outcomes  Clarified goals of care, Counseled regarding hospice, Provided end of life care  assistance, Provided psychosocial or spiritual support, Improved pain interventions, Improved non-pain symptom therapy, ACP counseling assistance     Time In: 0900 Time Out: 0945 Time Total: 45 Greater than 50%  of this time was spent counseling and coordinating care related to the above assessment and plan.  Signed by:  Ihor Dow, DNP, FNP-C Palliative Medicine Team  Phone: [phone] Fax: [phone]   Please contact Palliative Medicine Team phone at [phone] for questions and concerns.  For individual provider: See Shea Evans

## 2018-11-04 ENCOUNTER — Inpatient Hospital Stay: Payer: Medicare Other

## 2018-11-04 ENCOUNTER — Encounter: Payer: Medicare Other | Admitting: Nutrition

## 2018-11-04 DIAGNOSIS — R531 Weakness: Secondary | ICD-10-CM | POA: Diagnosis not present

## 2018-11-04 DIAGNOSIS — I10 Essential (primary) hypertension: Secondary | ICD-10-CM

## 2018-11-04 LAB — GI PATHOGEN PANEL BY PCR, STOOL
Adenovirus F 40/41: NOT DETECTED
Astrovirus: NOT DETECTED
Campylobacter by PCR: NOT DETECTED
Cryptosporidium by PCR: NOT DETECTED
Cyclospora cayetanensis: NOT DETECTED
E coli (ETEC) LT/ST: DETECTED — AB
E coli (STEC): NOT DETECTED
Entamoeba histolytica: NOT DETECTED
Enteroaggregative E coli: NOT DETECTED
Enteropathogenic E coli: NOT DETECTED
G lamblia by PCR: NOT DETECTED
Norovirus GI/GII: NOT DETECTED
Plesiomonas shigelloides: NOT DETECTED
Rotavirus A by PCR: NOT DETECTED
Salmonella by PCR: NOT DETECTED
Sapovirus: NOT DETECTED
Shigella by PCR: NOT DETECTED
Vibrio cholerae: NOT DETECTED
Vibrio: NOT DETECTED
Yersinia enterocolitica: NOT DETECTED

## 2018-11-04 LAB — URINE CULTURE
Culture: >=100000 — AB
Special Requests: NORMAL

## 2018-11-04 MED ORDER — MORPHINE SULFATE (CONCENTRATE) 10 MG/0.5ML PO SOLN
10.0000 mg | Freq: Four times a day (QID) | ORAL | Status: DC
  Administered 2018-11-04 – 2018-11-07 (×13): 10 mg via SUBLINGUAL
  Filled 2018-11-04 (×13): qty 0.5

## 2018-11-04 MED ORDER — MORPHINE SULFATE (CONCENTRATE) 10 MG/0.5ML PO SOLN
5.0000 mg | Freq: Four times a day (QID) | ORAL | Status: DC
  Administered 2018-11-04: 5 mg via SUBLINGUAL
  Filled 2018-11-04: qty 0.5

## 2018-11-04 NOTE — Progress Notes (Signed)
Daily Progress Note   Patient Name: Taylor Oconnor       Date: 11/04/2018 DOB: 08-04-1950  Age: 68 y.o. MRN#: [id number] Attending Physician: Karie Kirks, DO Primary Care Physician: Unk Pinto, MD Admit Date: 11/01/2018  Reason for Consultation/Follow-up: Establishing goals of care and Terminal Care  Subjective: Chart reviewed this AM. Patient has required significant IV morphine use in 24 hours.   Spoke with patient via telephone to discuss plan of care and symptom management. Patient reports her pain is better today with IV morphine. She rates her pain a 3. Antiqua worked with physical therapy yesterday and believes this may be a reason her pain was so significant yesterday. We discussed scheduling PO morphine, which she agrees with.   Krisna also reports difficulty swallowing pills. Explained that hospice home will not continue medications not aimed at comfort. Also that MVI/cholesterol medication are not impacting her prognosis with progressive cancer. Erikah agrees to discontinue medications not aimed at comfort.   Answered questions and concerns. Emotional/spiritual support provided.   Length of Stay: 2  Current Medications: Scheduled Meds:  . atenolol  50 mg Oral QPC supper  . docusate sodium  100 mg Oral BID  . feeding supplement (ENSURE ENLIVE)  237 mL Oral BID BM  . morphine CONCENTRATE  5 mg Sublingual Q6H  . polyethylene glycol  17 g Oral Daily  . Rivaroxaban  15 mg Oral BID  . traZODone  150 mg Oral QHS    Continuous Infusions: . sodium chloride 75 mL/hr at 11/04/18 0118  . cefTRIAXone (ROCEPHIN)  IV 1 g (11/03/18 1545)    PRN Meds: morphine injection, ondansetron **OR** ondansetron (ZOFRAN) IV, oxyCODONE, prochlorperazine, traMADol  Physical Exam           Vital Signs: BP 110/66 (BP Location: Right Arm)   Pulse (!) 110   Temp 97.7 F (36.5 C) (Oral)   Resp 16   Ht 5\' 3"  (1.6 m)   Wt 81 kg   SpO2 96%   BMI 31.63 kg/m  SpO2: SpO2: 96 % O2 Device: O2 Device: Room Air O2 Flow Rate:    Intake/output summary:   Intake/Output Summary (Last 24 hours) at 11/04/2018 0950 Last data filed at 11/04/2018 0859 Gross per 24 hour  Intake 2922.5 ml  Output 1303 ml  Net 1619.5 ml  LBM: Last BM Date: 11/02/18 Baseline Weight: Weight: 81 kg Most recent weight: Weight: 81 kg       Palliative Assessment/Data: PPS 40%    Flowsheet Rows     Most Recent Value  Intake Tab  Referral Department  Hospitalist  Unit at Time of Referral  Med/Surg Unit  Palliative Care Primary Diagnosis  Cancer  Palliative Care Type  New Palliative care  Date first seen by Palliative Care  11/03/18  Clinical Assessment  Palliative Performance Scale Score  40%  Psychosocial & Spiritual Assessment  Palliative Care Outcomes  Patient/Family meeting held?  Yes  Who was at the meeting?  patient  Palliative Care Outcomes  Clarified goals of care, Counseled regarding hospice, Provided end of life care assistance, Provided psychosocial or spiritual support, Improved pain interventions, Improved non-pain symptom therapy, ACP counseling assistance      Patient Active Problem List   Diagnosis Date Noted  . Palliative care by specialist   . Adult failure to thrive   . Intractable pain 11/02/2018  . Metastasis from pancreatic cancer ([place])   . Generalized weakness 11/01/2018  . Portal vein thrombosis 10/17/2018  . Port-A-Cath in place 08/02/2018  . Terminal care 07/26/2018  . Family history of breast cancer   . Pancreatic cancer ([place]) 07/14/2018  . Anxiety 12/01/2016  . Prediabetes 11/30/2016  . Malignant neoplasm of upper-outer quadrant of right breast in female, estrogen receptor positive ([place]) 12/03/2014  . Essential hypertension 12/14/2012  . Hyperlipidemia,  mixed 12/14/2012  . Vitamin D deficiency 12/14/2012  . Morbid obesity ([place]) 12/14/2012  . Lapband APS + [facility] repair Nov 2008 08/26/2012    Palliative Care Assessment & Plan   Patient Profile: 68 y.o. female  with past medical history of breast cancer, metastatic pancreatic cancer, diabetes mellitus, obesity, gastric banding admitted on 11/01/2018 with progressive weakness and decreased oral intake. Abdominal pain is chronic. She reports issues with impaction which she was disimpacted a few days ago in ED. Followed by Dr. Burr Medico. Repeat CT scan on 10/14/18 revealed disease progression. Her chemotherapy regimen was changed to gemcitabine and Abraxane 2 weeks on/one 1 week off which was started 10/21/18. Patient received once dose and responded poorly. Oncology following inpatient to discuss poor prognosis and recommendation for residential hospice placement. Palliative medicine consultation for goals of care.   Assessment: Metastatic pancreatic cancer Intractable, cancer-related pain Adult failure to thrive Generalized weakness  Recommendations/Plan:  Comfort focused care plan. Waiting on hospice facility bed.   Schedule Roxanol 5mg  SL q6h pain. Continue Morphine IV prn pain/dyspnea. Continue bowel regimen.  Continue comfort feeds per patient request.  Code Status: DNR   Code Status Orders  (From admission, onward)         Start     Ordered   11/01/18 2003  Do not attempt resuscitation (DNR)  Continuous    Question Answer Comment  In the event of cardiac or respiratory ARREST Do not call a "code blue"   In the event of cardiac or respiratory ARREST Do not perform Intubation, CPR, defibrillation or ACLS   In the event of cardiac or respiratory ARREST Use medication by any route, position, wound care, and other measures to relive pain and suffering. May use oxygen, suction and manual treatment of airway obstruction as needed for comfort.      11/01/18 2003        Code Status History     Date Active Date Inactive Code Status Order ID Comments User Context  06/30/2018 1658 07/02/2018 1837 Full Code [id number]  Shelly Coss, MD ED   08/06/2012 1947 08/07/2012 1624 Full Code [id number]  Earnstine Regal, MD Inpatient   08/06/2012 1043 08/06/2012 1947 Full Code [id number]  Earnstine Regal, MD ED   Advance Care Planning Activity       Prognosis:   Likely weeks  Discharge Planning:  Hospice facility  Care plan was discussed with patient, RN  Thank you for allowing the Palliative Medicine Team to assist in the care of this patient.   Time In: 0930 Time Out: 0950 Total Time 20 Prolonged Time Billed   no      Greater than 50%  of this time was spent counseling and coordinating care related to the above assessment and plan.  Ihor Dow, DNP, FNP-C Palliative Medicine Team  Phone: [phone] Fax: [phone]  Please contact Palliative Medicine Team phone at [phone] for questions and concerns.

## 2018-11-04 NOTE — Care Management Important Message (Signed)
Important Message  Patient Details IM Letter given to Sharren Bridge SW to present to the Patient Name: Taylor Oconnor MRN: [id number] Date of Birth: 10/04/1950   Medicare Important Message Given:  Yes     Kerin Salen 11/04/2018, 9:45 AM

## 2018-11-04 NOTE — Progress Notes (Signed)
PROGRESS NOTE  Taylor Oconnor [id number] DOB: Sep 15, 1950 DOA: 11/01/2018 PCP: Taylor Pinto, MD  Brief History   Taylor Sorto Priceis a 68 y.o.femalewith medical history significant ofbreast cancer, pancreatic cancer diabetes mellitus, obesity, gastric banding.Patient presented secondary to multiple week history of progressive weakness. Symptoms were very bad after last chemotherapy treatment. She reports being so weak that her husband has to carry her at times. Her biggest issue is getting up, but when she is up she is usually able to move around with a walker. She reports some decreased appetite which is caused her to eat less. She reports a little bit of nausea but no vomiting. Her abdominal pain is chronic and stable. She has had some issues with impaction for which she was disimpacted a few days ago in the emergency department and has now resultant loose stools.  ED Course:Afebrile, normal pulse, respirations of 21/min, normotensive, on room air. Sodium 131, BUN 24, magnesium 2.7, AST/ALT 78/48 respectively, alkaline phosphatase of 446, hemoglobin of 8.5 Imaging:None obtained. Medications/Course:IV fluids, Zofran, morphine. EDP referred to [name] for admission with profound weakness, hyponatremia, intractable pain of malignancy, and adult failure to thrive.  The patient and her family have decided that she will discharge from the acute care setting to a residential hospice situation. She is awaiting bed placement.  Consultants  . Palliative care  Procedures  . None  Antibiotics   Anti-infectives (From admission, onward)   Start     Dose/Rate Route Frequency Ordered Stop   11/02/18 1600  cefTRIAXone (ROCEPHIN) 1 g in sodium chloride 0.9 % 100 mL IVPB     1 g 200 mL/hr over 30 Minutes Intravenous Every 24 hours 11/02/18 1522 11/05/18 1559     .   Subjective  The patient is resting comfortably. No new complaints.  Objective   Vitals:  Vitals:   11/04/18 0628  11/04/18 1329  BP: 110/66 125/76  Pulse: (!) 110 (!) 122  Resp: 16 16  Temp: 97.7 F (36.5 C) 98.1 F (36.7 C)  SpO2: 96% 96%   Exam:  Constitutional:  . The patient is awake, alert, and oriented x 3. No acute distress. Chronically ill appearing. Respiratory:  . No increased work of breathing. . No wheezes, rales, or rhonchi . No tactile fremitus Cardiovascular:  . Regular rate and rhythm . No murmurs, ectopy, or gallups. . No lateral PMI. No thrills. Abdomen:  . Abdomen is soft,  non-distended . There is mild epigastric tenderness to palpation . No hernias, masses, or organomegaly . Normoactive bowel sounds.  Musculoskeletal:  . No cyanosis, clubbing, or edema Skin:  . No rashes, lesions, ulcers . palpation of skin: no induration or nodules Neurologic:  . CN 2-12 intact . Sensation all 4 extremities intact Psychiatric:  . Mental status o Mood, affect appropriate o Orientation to person, place, time  . judgment and insight appear intact  I have personally reviewed the following:   Today's Data  . Vitals, BMP, CBC  Scheduled Meds: . atenolol  50 mg Oral QPC supper  . docusate sodium  100 mg Oral BID  . feeding supplement (ENSURE ENLIVE)  237 mL Oral BID BM  . morphine CONCENTRATE  10 mg Sublingual Q6H  . polyethylene glycol  17 g Oral Daily  . Rivaroxaban  15 mg Oral BID  . traZODone  150 mg Oral QHS   Continuous Infusions: . sodium chloride 75 mL/hr at 11/04/18 0118  . 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 Does not appear to have immediately reversible causes. Complicated by poor  oral intake from poor appetite. Continue supportive care, IV fluid hydration w/ NS @ 45mL per hour. Plan is for the patient to discharge to residential hospice.  Abdominal pain, Intractable pain of malignancy: Secondary to metastatic pancreatic cancer. Patient is on chronic pain regimen as an outpatient for which she is recently held secondary to issues with constipation and impaction. She has been using tramadolmore recently instead of her OxyContin. Continue oxycodone and tramadol prn as well as morphine 1mg  q3h prn for breakthrough pain not relieved with oral medication. Plan is for the patient to discharge to residential hospice when a bed becomes available.  Urinary retention, Dysuria: Patient reports difficulty urinating with associated pain.  Patient is afebrile without leukocytosis.  Urinalysis notable for negative leukoesterase, positive nitrite, many bacteria.  Bladder scan with greater than 350 retained urine.  Status post in and out catheterization. She is receiving ceftriaxone for UTI. Urine culture has grown out E. Coli. Blood cultures x2: have shown no growth x 48  Hours. Foley catheter placed for continued urinary retention.  Hyponatremia, likely secondary to hypovolemia: Sodium 131 on admission.  Etiology likely secondary to poor oral intake and hypovolemic hyponatremia. Continue IV fluid hydration with NS at 75 mL's per hour. Monitor.  Pancreatic cancer: Patient with associated diffuse metastasis to liver. Patient is currently undergoing palliative chemotherapy as an outpatient.   Breast cancer: Patient is status post right lumpectomy and adjuvant radiation therapy. She is currently on anastrozole as an outpatient. Continue anastrozole  Poor appetite: Patient with nausea, no vomiting and no pain; which is prohibiting oral intake. Per primary oncology note, she was recommended to drink protein supplements and was referred for dietician evaluation. Continue to encourage  increased oral intake. Nutrition consult appreciated. Continue supportive care, antiemetics.  Liver thrombosis: Continue Xarelto  Chronic anemia: Stable.  Hemoglobin 8.2  Essential hypertension: Normotensive on home dose of atenolol. Verapamil held.  Elevated liver enzymes: Noted and chronic.  Insomnia: Continue Trazodone  I have seen and examined this patient myself. I have spent 30 minutes in her evaluation and care.  DVT prophylaxis: Lovenox Code Status: DNR Family Communication: none Disposition Plan: Discharge to residential hospice when a bed becomes available.  Dannette Kinkaid, DO [facility] Direct contact: see [email or web address]  7PM-7AM contact night coverage as above 11/04/2018, 6:02 PM  LOS: 1 day

## 2018-11-04 NOTE — Progress Notes (Signed)
CHG baths has been discontinued due to the patient receiving end of life care.

## 2018-11-04 NOTE — Progress Notes (Signed)
   11/04/18 1329  MEWS Score  Resp 16  Pulse Rate (!) 122  BP 125/76  Temp 98.1 F (36.7 C)  SpO2 96 %  O2 Device Room Air  MEWS Score  MEWS RR 0  MEWS Pulse 2  MEWS Systolic 0  MEWS LOC 0  MEWS Temp 0  MEWS Score 2  MEWS Score Color Yellow  MEWS Assessment  Is this an acute change? No

## 2018-11-05 DIAGNOSIS — I10 Essential (primary) hypertension: Secondary | ICD-10-CM | POA: Diagnosis not present

## 2018-11-05 DIAGNOSIS — R52 Pain, unspecified: Secondary | ICD-10-CM | POA: Diagnosis not present

## 2018-11-05 DIAGNOSIS — R627 Adult failure to thrive: Secondary | ICD-10-CM | POA: Diagnosis not present

## 2018-11-05 MED ORDER — CHLORHEXIDINE GLUCONATE CLOTH 2 % EX PADS
6.0000 | MEDICATED_PAD | Freq: Every day | CUTANEOUS | Status: DC
  Administered 2018-11-05 – 2018-11-07 (×3): 6 via TOPICAL

## 2018-11-05 NOTE — Progress Notes (Addendum)
MEWS/VS Documentation      11/04/2018 1329 11/04/2018 2321 11/05/2018 0617 11/05/2018 1216   MEWS Score:  2  1  1  2    MEWS Score Color:  Yellow  Green  Green  Yellow   Resp:  16  20  17  16    Pulse:  (!) 122  (!) 109  (!) 109  (!) 114   BP:  125/76  115/70  115/72  119/84   Temp:  98.1 F (36.7 C)  98.2 F (36.8 C)  (!) 97.4 F (36.3 C)  97.9 F (36.6 C)   O2 Device:  Room Air  Room [employer]     No acute change.

## 2018-11-05 NOTE — Progress Notes (Signed)
MEWS/VS Documentation      11/05/2018 1216 11/05/2018 1357 11/05/2018 2117 11/05/2018 2329   MEWS Score:  2  1  3  3    MEWS Score Color:  Yellow  Green  Yellow  Yellow   Resp:  16  18  18  20    Pulse:  (!) 114  (!) 110  (!) 138  (!) 131   BP:  119/84  116/70  109/76  101/61   Temp:  97.9 F (36.6 C)  98.2 F (36.8 C)  98.5 F (36.9 C)  97.8 F (36.6 C)   O2 Device:  Room Air  Room Air  Room Air  Room Air     Patient resting quietly with no acute distress noted. Denies CP, SOB, N/V with questioned. [facility], Dr Maudie Mercury notified of yellow 3 MEWS score. Will continue to monitor and assess this patient needs and/or concerns.

## 2018-11-05 NOTE — Progress Notes (Signed)
PROGRESS NOTE  Taylor Oconnor [id number] DOB: 1950-09-24 DOA: 11/01/2018 PCP: Unk Pinto, MD  Brief History   Taylor Cartright Priceis a 68 y.o.femalewith medical history significant ofbreast cancer, pancreatic cancer diabetes mellitus, obesity, gastric banding.Patient presented secondary to multiple week history of progressive weakness. Symptoms were very bad after last chemotherapy treatment. She reports being so weak that her husband has to carry her at times. Her biggest issue is getting up, but when she is up she is usually able to move around with a walker. She reports some decreased appetite which is caused her to eat less. She reports a little bit of nausea but no vomiting. Her abdominal pain is chronic and stable. She has had some issues with impaction for which she was disimpacted a few days ago in the emergency department and has now resultant loose stools.  ED Course:Afebrile, normal pulse, respirations of 21/min, normotensive, on room air. Sodium 131, BUN 24, magnesium 2.7, AST/ALT 78/48 respectively, alkaline phosphatase of 446, hemoglobin of 8.5 Imaging:None obtained. Medications/Course:IV fluids, Zofran, morphine. EDP referred to [facility] for admission with profound weakness, hyponatremia, intractable pain of malignancy, and adult failure to thrive.  The patient and her family have decided that she will discharge from the acute care setting to a residential hospice situation. She is awaiting bed placement.  Consultants  . Palliative care  Procedures  . None  Antibiotics   Anti-infectives (From admission, onward)   Start     Dose/Rate Route Frequency Ordered Stop   11/02/18 1600  cefTRIAXone (ROCEPHIN) 1 g in sodium chloride 0.9 % 100 mL IVPB     1 g 200 mL/hr over 30 Minutes Intravenous Every 24 hours 11/02/18 1522 11/04/18 1805     .   Subjective  The patient is resting comfortably. No new complaints.  Objective   Vitals:  Vitals:   11/05/18 1216  11/05/18 1357  BP: 119/84 116/70  Pulse: (!) 114 (!) 110  Resp: 16 18  Temp: 97.9 F (36.6 C) 98.2 F (36.8 C)  SpO2: 96% 96%   Exam:  Constitutional:  . The patient is awake, alert, and oriented x 3. No acute distress. Chronically ill appearing. Respiratory:  . No increased work of breathing. . No wheezes, rales, or rhonchi . No tactile fremitus Cardiovascular:  . Regular rate and rhythm . No murmurs, ectopy, or gallups. . No lateral PMI. No thrills. Abdomen:  . Abdomen is soft,  non-distended . There is mild epigastric tenderness to palpation . No hernias, masses, or organomegaly . Normoactive bowel sounds.  Musculoskeletal:  . No cyanosis, clubbing, or edema Skin:  . No rashes, lesions, ulcers . palpation of skin: no induration or nodules Neurologic:  . CN 2-12 intact . Sensation all 4 extremities intact Psychiatric:  . Mental status o Mood, affect appropriate o Orientation to person, place, time  . judgment and insight appear intact  I have personally reviewed the following:   Today's Data  . Vitals, BMP, CBC  Scheduled Meds: . atenolol  50 mg Oral QPC supper  . Chlorhexidine Gluconate Cloth  6 each Topical Daily  . docusate sodium  100 mg Oral BID  . feeding supplement (ENSURE ENLIVE)  237 mL Oral BID BM  . morphine CONCENTRATE  10 mg Sublingual Q6H  . polyethylene glycol  17 g Oral Daily  . Rivaroxaban  15 mg Oral BID  . traZODone  150 mg Oral QHS   Continuous Infusions: . 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 Does not appear to have immediately reversible causes. Complicated by  poor oral intake from poor appetite. Continue supportive care, IV fluid hydration w/ NS @ 38mL per hour. Plan is for the patient to discharge to residential hospice.  Abdominal pain, Intractable pain of malignancy: Secondary to metastatic pancreatic cancer. Patient is on chronic pain regimen as an outpatient for which she is recently held secondary to issues with constipation and impaction. She has been using tramadolmore recently instead of her OxyContin. Continue oxycodone and tramadol prn as well as morphine 1mg  q3h prn for breakthrough pain not relieved with oral medication. Plan is for the patient to discharge to residential hospice when a bed becomes available.  Urinary retention, Dysuria: Patient reports difficulty urinating with associated pain.  Patient is afebrile without leukocytosis.  Urinalysis notable for negative leukoesterase, positive nitrite, many bacteria.  Bladder scan with greater than 350 retained urine.  Status post in and out catheterization. She is receiving ceftriaxone for UTI. Urine culture has grown out E. Coli. Blood cultures x2: have shown no growth x 48  Hours. Foley catheter placed for continued urinary retention.  Hyponatremia, likely secondary to hypovolemia: Sodium 131 on admission.  Etiology likely secondary to poor oral intake and hypovolemic hyponatremia. Continue IV fluid hydration with NS at 75 mL's per hour. Monitor.  Pancreatic cancer: Patient with associated diffuse metastasis to liver. Patient is currently undergoing palliative chemotherapy as an outpatient.   Breast cancer: Patient is status post right lumpectomy and adjuvant radiation therapy. She is currently on anastrozole as an outpatient. Continue anastrozole  Poor appetite: Patient with nausea, no vomiting and no pain; which is prohibiting oral intake. Per primary oncology note, she was recommended to drink protein supplements and was referred for dietician evaluation. Continue to encourage  increased oral intake. Nutrition consult appreciated. Continue supportive care, antiemetics.  Liver thrombosis: Continue Xarelto  Chronic anemia: Stable.  Hemoglobin 8.2  Essential hypertension: Normotensive on home dose of atenolol. Verapamil held.  Elevated liver enzymes: Noted and chronic.  Insomnia: Continue Trazodone  I have seen and examined this patient myself. I have spent 28 minutes in her evaluation and care.  DVT prophylaxis: Lovenox Code Status: DNR Family Communication: none Disposition Plan: Discharge to residential hospice when a bed becomes available.  Taylor Runde, DO [facility] Direct contact: see [email or web address]  7PM-7AM contact night coverage as above 11/05/2018, 3:16 PM  LOS: 1 day

## 2018-11-06 LAB — CULTURE, BLOOD (ROUTINE X 2)
Culture: NO GROWTH
Culture: NO GROWTH
Special Requests: ADEQUATE
Special Requests: ADEQUATE

## 2018-11-06 MED ORDER — LIP MEDEX EX OINT
TOPICAL_OINTMENT | CUTANEOUS | Status: AC
  Administered 2018-11-06: 08:00:00
  Filled 2018-11-06: qty 7

## 2018-11-06 NOTE — Progress Notes (Signed)
PROGRESS NOTE  Taylor Oconnor [id number] DOB: 1950/08/19 DOA: 11/01/2018 PCP: Unk Pinto, MD  Brief History   Taylor Oconnor a 68 y.o.femalewith medical history significant ofbreast cancer, pancreatic cancer diabetes mellitus, obesity, gastric banding.Patient presented secondary to multiple week history of progressive weakness. Symptoms were very bad after last chemotherapy treatment. She reports being so weak that her husband has to carry her at times. Her biggest issue is getting up, but when she is up she is usually able to move around with a walker. She reports some decreased appetite which is caused her to eat less. She reports a little bit of nausea but no vomiting. Her abdominal pain is chronic and stable. She has had some issues with impaction for which she was disimpacted a few days ago in the emergency department and has now resultant loose stools.  ED Course:Afebrile, normal pulse, respirations of 21/min, normotensive, on room air. Sodium 131, BUN 24, magnesium 2.7, AST/ALT 78/48 respectively, alkaline phosphatase of 446, hemoglobin of 8.5 Imaging:None obtained. Medications/Course:IV fluids, Zofran, morphine. EDP referred to [facility] for admission with profound weakness, hyponatremia, intractable pain of malignancy, and adult failure to thrive.  The patient and her family have decided that she will discharge from the acute care setting to a residential hospice situation. She is awaiting bed placement.  Consultants  . Palliative care  Procedures  . None  Antibiotics   Anti-infectives (From admission, onward)   Start     Dose/Rate Route Frequency Ordered Stop   11/02/18 1600  cefTRIAXone (ROCEPHIN) 1 g in sodium chloride 0.9 % 100 mL IVPB     1 g 200 mL/hr over 30 Minutes Intravenous Every 24 hours 11/02/18 1522 11/04/18 1805     .   Subjective  The patient is resting comfortably. No new complaints.  Objective   Vitals:  Vitals:   11/06/18 0401  11/06/18 1347  BP: 119/76 112/73  Pulse: (!) 125 (!) 120  Resp: 18 16  Temp: 97.9 F (36.6 C) (!) 97.5 F (36.4 C)  SpO2: 95% 95%   Exam:  Constitutional:  . The patient is awake, alert, and oriented x 3. No acute distress. Chronically ill appearing. Respiratory:  . No increased work of breathing. . No wheezes, rales, or rhonchi . No tactile fremitus Cardiovascular:  . Regular rate and rhythm . No murmurs, ectopy, or gallups. . No lateral PMI. No thrills. Abdomen:  . Abdomen is soft,  non-distended . There is mild epigastric tenderness to palpation . No hernias, masses, or organomegaly . Normoactive bowel sounds.  Musculoskeletal:  . No cyanosis, clubbing, or edema Skin:  . No rashes, lesions, ulcers . palpation of skin: no induration or nodules Neurologic:  . CN 2-12 intact . Sensation all 4 extremities intact Psychiatric:  . Mental status o Mood, affect appropriate o Orientation to person, place, time  . judgment and insight appear intact  I have personally reviewed the following:   Today's Data  . Vitals, BMP, CBC  Scheduled Meds: . atenolol  50 mg Oral QPC supper  . Chlorhexidine Gluconate Cloth  6 each Topical Daily  . docusate sodium  100 mg Oral BID  . feeding supplement (ENSURE ENLIVE)  237 mL Oral BID BM  . morphine CONCENTRATE  10 mg Sublingual Q6H  . polyethylene glycol  17 g Oral Daily  . Rivaroxaban  15 mg Oral BID  . traZODone  150 mg Oral QHS   Continuous Infusions: . 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 Does not appear to have immediately reversible causes. Complicated  by poor oral intake from poor appetite. Continue supportive care, IV fluid hydration w/ NS @ 21mL per hour. Plan is for the patient to discharge to residential hospice.  Abdominal pain, Intractable pain of malignancy: Secondary to metastatic pancreatic cancer. Patient is on chronic pain regimen as an outpatient for which she is recently held secondary to issues with constipation and impaction. She has been using tramadolmore recently instead of her OxyContin. Continue oxycodone and tramadol prn as well as morphine 1mg  q3h prn for breakthrough pain not relieved with oral medication. Plan is for the patient to discharge to residential hospice when a bed becomes available.  Urinary retention, Dysuria: Patient reports difficulty urinating with associated pain.  Patient is afebrile without leukocytosis.  Urinalysis notable for negative leukoesterase, positive nitrite, many bacteria.  Bladder scan with greater than 350 retained urine.  Status post in and out catheterization. She is receiving ceftriaxone for UTI. Urine culture has grown out E. Coli. Blood cultures x2: have shown no growth x 48  Hours. Foley catheter placed for continued urinary retention.  Hyponatremia, likely secondary to hypovolemia: Sodium 131 on admission.  Etiology likely secondary to poor oral intake and hypovolemic hyponatremia. Continue IV fluid hydration with NS at 75 mL's per hour. Monitor.  Pancreatic cancer: Patient with associated diffuse metastasis to liver. Patient is currently undergoing palliative chemotherapy as an outpatient.   Breast cancer: Patient is status post right lumpectomy and adjuvant radiation therapy. She is currently on anastrozole as an outpatient. Continue anastrozole  Poor appetite: Patient with nausea, no vomiting and no pain; which is prohibiting oral intake. Per primary oncology note, she was recommended to drink protein supplements and was referred for dietician evaluation. Continue to  encourage increased oral intake. Nutrition consult appreciated. Continue supportive care, antiemetics.  Liver thrombosis: Continue Xarelto  Chronic anemia: Stable.  Hemoglobin 8.2  Essential hypertension: Normotensive on home dose of atenolol. Verapamil held.  Elevated liver enzymes: Noted and chronic.  Insomnia: Continue Trazodone  I have seen and examined this patient myself. I have spent 28 minutes in her evaluation and care.  DVT prophylaxis: Lovenox Code Status: DNR Family Communication: none Disposition Plan: Discharge to residential hospice when a bed becomes available.  Concettina Leth, DO [facility] Direct contact: see [email or web address]  7PM-7AM contact night coverage as above 11/06/2018, 4:22 PM  LOS: 1 day

## 2018-11-06 NOTE — TOC Progression Note (Signed)
Transition of Care [facility]) - Progression Note    Patient Details  Name: Taylor Oconnor MRN: [id number] Date of Birth: [birth year]  Transition of Care [facility] CM/SW [place], LCSW Phone Number: 11/06/2018, 9:25 AM  Clinical Narrative:   CSW received phone call from [facility] stating that there is not a bed available for patient today. CSW will continue to follow for support and hospice placement      Expected Discharge Plan: [place] Barriers to Discharge: Hospice Bed not available(also pending hospice eligibility confirmation)  Expected Discharge Plan and Services Expected Discharge Plan: [place] arrangements for the past 2 months: Single Family Home                                       Social Determinants of Health (SDOH) Interventions    Readmission Risk Interventions No flowsheet data found.

## 2018-11-06 NOTE — Progress Notes (Signed)
[facility] Documentation  No [employer] availability today. IDT and husband updated.   Freddie Breech, RN [facility] Liaison [phone]

## 2018-11-07 ENCOUNTER — Ambulatory Visit: Payer: Medicare Other | Admitting: Internal Medicine

## 2018-11-07 MED ORDER — MORPHINE SULFATE (CONCENTRATE) 10 MG/0.5ML PO SOLN: 10.0000 mg | Freq: Four times a day (QID) | ORAL | 0 refills | Status: AC

## 2018-11-07 MED ORDER — RIVAROXABAN 15 MG PO TABS: 15.0000 mg | ORAL_TABLET | Freq: Two times a day (BID) | ORAL | 0 refills | Status: AC

## 2018-11-07 MED ORDER — ATENOLOL 100 MG PO TABS: 50.0000 mg | ORAL_TABLET | Freq: Every day | ORAL | 0 refills | Status: AC

## 2018-11-07 MED ORDER — RIVAROXABAN 20 MG PO TABS: 20.0000 mg | ORAL_TABLET | Freq: Every day | ORAL | 0 refills | Status: AC

## 2018-11-07 MED ORDER — TRAZODONE HCL 150 MG PO TABS: 150.0000 mg | ORAL_TABLET | Freq: Every day | ORAL | 0 refills | Status: AC

## 2018-11-07 MED ORDER — DOCUSATE SODIUM 100 MG PO CAPS: 100.0000 mg | ORAL_CAPSULE | Freq: Two times a day (BID) | ORAL | 0 refills | Status: AC

## 2018-11-07 MED ORDER — POLYETHYLENE GLYCOL 3350 17 G PO PACK: 17.0000 g | PACK | Freq: Every day | ORAL | 0 refills | Status: AC

## 2018-11-07 MED ORDER — ONDANSETRON 8 MG PO TBDP: 8.0000 mg | ORAL_TABLET | Freq: Three times a day (TID) | ORAL | 0 refills | Status: AC | PRN

## 2018-11-07 NOTE — Progress Notes (Signed)
[facility]) Hospital Liaison note.    Met with husband to confirm interest and explain services. Family agreeable to transfer today. TOC manager aware.  Registration paper work completed. Dr. Orpah Melter  to assume care per family request.   RN please call report to [phone]. Please arrange transport for patient.    Thank you,      Farrel Gordon, RN, [facility]   [place]     [place] are on AMION

## 2018-11-07 NOTE — Discharge Summary (Signed)
Physician Discharge Summary  Taylor Oconnor [id number] DOB: [birth year] DOA: 11/01/2018  PCP: Unk Pinto, MD  Admit date: 11/01/2018 Discharge date: 11/07/2018  Admitted From: Home Disposition: [facility] residential hospice  Recommendations for Outpatient Follow-up:  1. Follow-up with physician at [facility]: No Equipment/Devices: Foley catheter  Discharge Condition: Stable CODE STATUS: DNR Diet recommendation: Comfort feeds as tolerated  History of present illness:  Taylor Oconnor Priceis a 68 y.o.femalewith medical history significant ofbreast cancer, pancreatic cancer diabetes mellitus, obesity, gastric banding.Patient presented secondary to multiple week history of progressive weakness. Symptoms were very bad after last chemotherapy treatment. She reports being so weak that her husband has to carry her at times. Her biggest issue is getting up, but when she is up she is usually able to move around with a walker. She reports some decreased appetite which is caused her to eat less. She reports a little bit of nausea but no vomiting. Her abdominal pain is chronic and stable. She has had some issues with impaction for which she was disimpacted a few days ago in the emergency department and has now resultant loose stools.  ED Course:Afebrile, normal pulse, respirations of 21/min, normotensive, on room air.Sodium 131, BUN 24, magnesium 2.7, AST/ALT 78/48 respectively, alkaline phosphatase of 446, hemoglobin of 8.5 Imaging:None obtained.Medications/Course:IV fluids, Zofran, morphine. EDP referred to [facility] for admission withprofound weakness, hyponatremia, intractable pain of malignancy, and adult failure to thrive.  The patient and her family have decided that she will discharge from the acute care setting to a residential hospice situation.  Discharging today to [place] residential hospice.  Hospital course:  Adult failure to thrive Likely  multifactorial from chronic illness and chemotherapy. Does not appear to have immediately reversible causes. Complicated by poor oral intake from poor appetite. Was supported with IV fluid hydration during hospitalization.  Discharging to residential hospice today.  Abdominal pain, Intractable pain of malignancy: Secondary to metastatic pancreatic cancer.  Continue oral morphine 10 mg every 6 hours scheduled.  Urinary retention, Dysuria: Patient reports difficulty urinating with associated pain. Urine culture with E. coli.  Completed course of IV antibiotics with ceftriaxone.  Continues with issues with urinary retention, Foley catheter placed; will continue for comfort outpatient with Foley catheter to remain in place.  Pancreatic cancer:  Patient with associated diffuse metastasis to liver. Patient was recently undergoing palliative chemotherapy as an outpatient.  Now transitioning to residential hospice.  Breast cancer:  Patient is status post right lumpectomy and adjuvant radiation therapy. She is currently on anastrozole as an outpatient.  Discontinue anastrozole and focus on comfort measures at residential hospice.  Poor appetite:  Patient withnausea, no vomiting and no pain;which isprohibiting oral intake. Per primary oncology note, she was recommended to drink protein supplements and was referred for dietician evaluation. Continue to encourage increased oral intake. Nutrition consult appreciated. Continue supportive care, antiemetics.  Liver thrombosis:Continue Xarelto, to complete 15 mg twice daily on 11-25-18, then will continue Xarelto with 20 mg p.o. daily on 11/09/2018.  Chronic anemia: Stable.Hemoglobin 8.2  Essential hypertension: Normotensive on home dose of atenolol. Verapamil discontinued  Elevated liver enzymes: Noted andchronic.  Insomnia: Continue Trazodone   Discharge Diagnoses:  Active Problems:   Essential hypertension   Morbid obesity  ([place])   Pancreatic cancer [facility])   Terminal care   Generalized weakness   Intractable pain   Metastasis from pancreatic cancer [facility])   Palliative care by specialist   Adult failure to thrive    Discharge Instructions  Discharge Instructions    Diet - low sodium heart healthy   Complete by: As directed    Increase activity slowly   Complete by: As directed      Allergies as of 11/07/2018      Reactions   Oxaliplatin Anaphylaxis   Codeine Itching   Lipitor [atorvastatin] Other (See Comments)   Severe fatigue. ? memory loss.   Ace Inhibitors Cough      Medication List    STOP taking these medications   anastrozole 1 MG tablet Commonly known as: ARIMIDEX   lidocaine 2 % solution Commonly known as: XYLOCAINE   magic mouthwash Soln   nystatin 100000 UNIT/ML suspension Commonly known as: MYCOSTATIN   oxyCODONE 5 MG immediate release tablet Commonly known as: Oxy IR/ROXICODONE   prochlorperazine 10 MG tablet Commonly known as: COMPAZINE   promethazine 25 MG tablet Commonly known as: PHENERGAN   Rivaroxaban 15 & 20 MG Tbpk Replaced by: Rivaroxaban 15 MG Tabs tablet   rosuvastatin 10 MG tablet Commonly known as: CRESTOR   traMADol 50 MG tablet Commonly known as: ULTRAM   verapamil 80 MG tablet Commonly known as: CALAN     TAKE these medications   atenolol 100 MG tablet Commonly known as: TENORMIN Take 0.5 tablets (50 mg total) by mouth daily after supper.   docusate sodium 100 MG capsule Commonly known as: COLACE Take 1 capsule (100 mg total) by mouth 2 (two) times daily.   morphine CONCENTRATE 10 MG/0.5ML Soln concentrated solution Place 0.5 mLs (10 mg total) under the tongue every 6 (six) hours.   ondansetron 8 MG disintegrating tablet Commonly known as: Zofran ODT Take 1 tablet (8 mg total) by mouth every 8 (eight) hours as needed for nausea or vomiting.   polyethylene glycol 17 g packet Commonly known as: MIRALAX / GLYCOLAX Take 17 g by  mouth daily. Start taking on: 2018-12-04   Rivaroxaban 15 MG Tabs tablet Commonly known as: Xarelto Take 1 tablet (15 mg total) by mouth 2 (two) times daily with a meal for 2 days. Replaces: Rivaroxaban 15 & 20 MG Tbpk   rivaroxaban 20 MG Tabs tablet Commonly known as: Xarelto Take 1 tablet (20 mg total) by mouth daily with supper. Start taking on: November 09, 2018   traZODone 150 MG tablet Commonly known as: DESYREL Take 1 tablet (150 mg total) by mouth at bedtime.       Allergies  Allergen Reactions  . Oxaliplatin Anaphylaxis  . Codeine Itching  . Lipitor [Atorvastatin] Other (See Comments)    Severe fatigue. ? memory loss.  . Ace Inhibitors Cough    Consultations:  Palliative care  Oncology   Procedures/Studies: Ct Abdomen Pelvis W Contrast  Result Date: 10/14/2018 CLINICAL DATA:  Follow-up metastatic pancreatic carcinoma. EXAM: CT ABDOMEN AND PELVIS WITH CONTRAST TECHNIQUE: Multidetector CT imaging of the abdomen and pelvis was performed using the standard protocol following bolus administration of intravenous contrast. CONTRAST:  12mL OMNIPAQUE IOHEXOL 300 MG/ML  SOLN COMPARISON:  MRI on 07/01/2018 FINDINGS: Lower Chest: No acute findings. Hepatobiliary: Innumerable new low-attenuation masses are seen throughout the liver since prior study, consistent with diffuse liver metastases. Index lesion in the posterior right hepatic lobe measures 5.2 x 3.5 cm on image 35/8. New diffuse portal vein thrombosis is also seen since prior exam. Prior cholecystectomy again noted. Internal biliary stent is seen in place and there is no evidence of biliary ductal dilatation. Pancreas: Soft tissue mass in the pancreatic head currently measures  2.2 x 1.7 cm, compared to 3.0 x 2.9 cm when measured in same planes on prior study. Spleen: Within normal limits in size and appearance. Thrombosis of distal splenic vein is seen, which is new since prior study. Adrenals/Urinary Tract: No  masses identified. No evidence of hydronephrosis. Stomach/Bowel: Gastric lap band remains in place. No evidence of obstruction, inflammatory process or abnormal fluid collections. Vascular/Lymphatic: New mild lymphadenopathy in the portacaval and peripancreatic regions, with index lymph node in the portacaval space measuring 17 mm short axis on image 43/8. No abdominal aortic aneurysm. Reproductive:  No mass or other significant abnormality. Other:  None. Musculoskeletal:  No suspicious bone lesions identified. IMPRESSION: Marked increase in diffuse liver metastases. New mild portacaval and peripancreatic lymphadenopathy, consistent with metastatic disease. Mild decrease in size of pancreatic head mass. Diffuse portal vein thrombosis, new since prior exam. These results will be called to the ordering clinician or representative by the Radiologist Assistant, and communication documented in the PACS or zVision Dashboard. Electronically Signed   By: Marlaine Hind M.D.   On: 10/14/2018 15:58      Subjective: Patient seen and examined at bedside, daughter present.  Awaiting for hospice bed to open today.  States pain is well controlled on current regimen.  No other complaints or concerns at this time.  Denies headache, no fever/chills/night sweats, no nausea cefonicid diarrhea, no chest pain, shortness of breath.  No acute events overnight per nursing staff.   Discharge Exam: Vitals:   11/06/18 2305 11/07/18 0741  BP: 101/71 104/67  Pulse: 88 96  Resp: 16 16  Temp:  97.7 F (36.5 C)  SpO2: 94% 95%   Vitals:   11/06/18 0401 11/06/18 1347 11/06/18 2305 11/07/18 0741  BP: 119/76 112/73 101/71 104/67  Pulse: (!) 125 (!) 120 88 96  Resp: 18 16 16 16   Temp: 97.9 F (36.6 C) (!) 97.5 F (36.4 C)  97.7 F (36.5 C)  TempSrc: Oral Oral  Oral  SpO2: 95% 95% 94% 95%  Weight:      Height:        General: Pt is alert, awake, not in acute distress Cardiovascular: RRR, S1/S2 +, no rubs, no  gallops Respiratory: CTA bilaterally, no wheezing, no rhonchi Abdominal: Soft, NT, ND, bowel sounds + Extremities: no edema, no cyanosis    The results of significant diagnostics from this hospitalization (including imaging, microbiology, ancillary and laboratory) are listed below for reference.     Microbiology: Recent Results (from the past 240 hour(s))  GI pathogen panel by PCR, stool     Status: Abnormal   Collection Time: 11/01/18  1:15 PM   Specimen: Stool  Result Value Ref Range Status   Plesiomonas shigelloides NOT DETECTED NOT DETECTED Final   Yersinia enterocolitica NOT DETECTED NOT DETECTED Final   Vibrio NOT DETECTED NOT DETECTED Final   Enteropathogenic E coli NOT DETECTED NOT DETECTED Final   E coli (ETEC) LT/ST DETECTED (A) NOT DETECTED Final   E coli [id number] by PCR Not applicable NOT DETECTED Final   Cryptosporidium by PCR NOT DETECTED NOT DETECTED Final   Entamoeba histolytica NOT DETECTED NOT DETECTED Final   Adenovirus F 40/41 NOT DETECTED NOT DETECTED Final   Norovirus GI/GII NOT DETECTED NOT DETECTED Final   Sapovirus NOT DETECTED NOT DETECTED Final    Comment: (NOTE) Performed At: [facility] [place], [place] [id number] Rush Farmer MD [id number]    Vibrio cholerae NOT DETECTED NOT DETECTED Final   Campylobacter by  PCR NOT DETECTED NOT DETECTED Final   Salmonella by PCR NOT DETECTED NOT DETECTED Final   E coli (STEC) NOT DETECTED NOT DETECTED Final   Enteroaggregative E coli NOT DETECTED NOT DETECTED Final   Shigella by PCR NOT DETECTED NOT DETECTED Final   Cyclospora cayetanensis NOT DETECTED NOT DETECTED Final   Astrovirus NOT DETECTED NOT DETECTED Final   G lamblia by PCR NOT DETECTED NOT DETECTED Final   Rotavirus A by PCR NOT DETECTED NOT DETECTED Final  C Difficile Quick Screen w PCR reflex     Status: None   Collection Time: 11/01/18  1:15 PM   Specimen: Nasopharyngeal Swab; Stool  Result Value Ref Range Status   C  Diff antigen NEGATIVE NEGATIVE Final   C Diff toxin NEGATIVE NEGATIVE Final   C Diff interpretation No C. difficile detected.  Final    Comment: Performed at [facility], [place] [address] [place], [place] [address]  SARS CORONAVIRUS 2 (TAT 6-24 HRS) Nasopharyngeal Nasopharyngeal Swab     Status: None   Collection Time: 11/01/18  1:15 PM   Specimen: Nasopharyngeal Swab  Result Value Ref Range Status   SARS Coronavirus 2 NEGATIVE NEGATIVE Final    Comment: (NOTE) SARS-CoV-2 target nucleic acids are NOT DETECTED. The SARS-CoV-2 RNA is generally detectable in upper and lower respiratory specimens during the acute phase of infection. Negative results do not preclude SARS-CoV-2 infection, do not rule out co-infections with other pathogens, and should not be used as the sole basis for treatment or other patient management decisions. Negative results must be combined with clinical observations, patient history, and epidemiological information. The expected result is Negative. Fact Sheet for Patients: SugarRoll.be Fact Sheet for Healthcare Providers: https://www.woods-mathews.com/ This test is not yet approved or cleared by the [place] FDA and  has been authorized for detection and/or diagnosis of SARS-CoV-2 by FDA under an Emergency Use Authorization (EUA). This EUA will remain  in effect (meaning this test can be used) for the duration of the COVID-19 declaration under Section 56 4(b)(1) of the Act, 21 U.S.C. section 360bbb-3(b)(1), unless the authorization is terminated or revoked sooner. Performed at [facility], [place] [address], [place], [place] [address]   Culture, blood (routine x 2)     Status: None   Collection Time: 11/01/18  1:16 PM   Specimen: BLOOD  Result Value Ref Range Status   Specimen Description   Final    BLOOD RIGHT ANTECUBITAL Performed at [facility] [address],  [place], [place] [address]    Special Requests   Final    BOTTLES DRAWN AEROBIC AND ANAEROBIC Blood Culture adequate volume Performed at [place] [address], [place], [place] [address]    Culture   Final    NO GROWTH 5 DAYS Performed at [facility], [place] [address] [place], [place] [address]    Report Status 11/06/2018 FINAL  Final  Culture, blood (routine x 2)     Status: None   Collection Time: 11/01/18  1:16 PM   Specimen: BLOOD  Result Value Ref Range Status   Specimen Description   Final    BLOOD LEFT ANTECUBITAL Performed at [facility], [place] [address], [place], [place] [address]    Special Requests   Final    BOTTLES DRAWN AEROBIC AND ANAEROBIC Blood Culture adequate volume Performed at [facility] [address], [place],  [address]  Culture   Final    NO GROWTH 5 DAYS Performed at [facility], [place] [address], [place], [place] [address]    Report Status 11/06/2018 FINAL  Final  Culture, Urine     Status: Abnormal   Collection Time: 11/02/18  1:56 PM   Specimen: Urine, Catheterized  Result Value Ref Range Status   Specimen Description   Final    URINE, CATHETERIZED Performed at [facility] [address], [place], [place] [address]    Special Requests   Final    Normal Performed at [facility], [place] [address], [place], [place] [address]    Culture >=100,000 COLONIES/mL ESCHERICHIA COLI (A)  Final   Report Status 11/04/2018 FINAL  Final   Organism ID, Bacteria ESCHERICHIA COLI (A)  Final      Susceptibility   Escherichia coli - MIC*    AMPICILLIN <=2 SENSITIVE Sensitive     CEFAZOLIN <=4 SENSITIVE Sensitive     CEFTRIAXONE <=1 SENSITIVE Sensitive     CIPROFLOXACIN <=0.25 SENSITIVE Sensitive     GENTAMICIN <=1 SENSITIVE Sensitive     IMIPENEM <=0.25 SENSITIVE Sensitive     NITROFURANTOIN <=16 SENSITIVE Sensitive     TRIMETH/SULFA <=20 SENSITIVE  Sensitive     AMPICILLIN/SULBACTAM <=2 SENSITIVE Sensitive     PIP/TAZO <=4 SENSITIVE Sensitive     Extended ESBL NEGATIVE Sensitive     * >=100,000 COLONIES/mL ESCHERICHIA COLI     Labs: BNP (last 3 results) No results for input(s): BNP in the last 8760 hours. Basic Metabolic Panel: Recent Labs  Lab 11/01/18 1105 11/02/18 0606 11/03/18 0512  NA 131* 132* 132*  K 4.0 3.9 3.9  CL 98 101 102  CO2 24 23 24   GLUCOSE 107* 106* 110*  BUN 24* 17 14  CREATININE 0.67 0.56 0.50  CALCIUM 8.1* 8.0* 8.1*  MG 2.7* 2.2 2.1  PHOS 3.2  --   --    Liver Function Tests: Recent Labs  Lab 11/01/18 1105  AST 78*  ALT 48*  ALKPHOS 446*  BILITOT 1.4*  PROT 4.9*  ALBUMIN 2.2*   Recent Labs  Lab 11/01/18 1105  LIPASE 12   No results for input(s): AMMONIA in the last 168 hours. CBC: Recent Labs  Lab 11/01/18 1105 11/02/18 0606 11/03/18 0512  WBC 6.9 9.0 14.3*  NEUTROABS 2.7  --   --   HGB 8.5* 8.2* 8.2*  HCT 27.7* 27.4* 26.8*  MCV 101.1* 101.9* 101.9*  PLT 392 430* 444*   Cardiac Enzymes: No results for input(s): CKTOTAL, CKMB, CKMBINDEX, TROPONINI in the last 168 hours. BNP: Invalid input(s): POCBNP CBG: Recent Labs  Lab 11/01/18 1137  GLUCAP 93   D-Dimer No results for input(s): DDIMER in the last 72 hours. Hgb A1c No results for input(s): HGBA1C in the last 72 hours. Lipid Profile No results for input(s): CHOL, HDL, LDLCALC, TRIG, CHOLHDL, LDLDIRECT in the last 72 hours. Thyroid function studies No results for input(s): TSH, T4TOTAL, T3FREE, THYROIDAB in the last 72 hours.  Invalid input(s): FREET3 Anemia work up No results for input(s): VITAMINB12, FOLATE, FERRITIN, TIBC, IRON, RETICCTPCT in the last 72 hours. Urinalysis    Component Value Date/Time   COLORURINE YELLOW 11/02/2018 1356   APPEARANCEUR HAZY (A) 11/02/2018 1356   LABSPEC 1.015 11/02/2018 1356   PHURINE 6.0 11/02/2018 1356   GLUCOSEU NEGATIVE 11/02/2018 1356   HGBUR MODERATE (A) 11/02/2018  [place] 11/02/2018 [place] 11/02/2018  [place] 11/02/2018 1356   UROBILINOGEN 0.2 05/30/2013 0903   NITRITE POSITIVE (A) 11/02/2018 1356   LEUKOCYTESUR NEGATIVE 11/02/2018 1356   Sepsis Labs Invalid input(s): PROCALCITONIN,  WBC,  LACTICIDVEN Microbiology Recent Results (from the past 240 hour(s))  GI pathogen panel by PCR, stool     Status: Abnormal   Collection Time: 11/01/18  1:15 PM   Specimen: Stool  Result Value Ref Range Status   Plesiomonas shigelloides NOT DETECTED NOT DETECTED Final   Yersinia enterocolitica NOT DETECTED NOT DETECTED Final   Vibrio NOT DETECTED NOT DETECTED Final   Enteropathogenic E coli NOT DETECTED NOT DETECTED Final   E coli (ETEC) LT/ST DETECTED (A) NOT DETECTED Final   E coli [id number] by PCR Not applicable NOT DETECTED Final   Cryptosporidium by PCR NOT DETECTED NOT DETECTED Final   Entamoeba histolytica NOT DETECTED NOT DETECTED Final   Adenovirus F 40/41 NOT DETECTED NOT DETECTED Final   Norovirus GI/GII NOT DETECTED NOT DETECTED Final   Sapovirus NOT DETECTED NOT DETECTED Final    Comment: (NOTE) Performed At: [facility] [address] [place], [place] [id number] Rush Farmer MD [id number]    Vibrio cholerae NOT DETECTED NOT DETECTED Final   Campylobacter by PCR NOT DETECTED NOT DETECTED Final   Salmonella by PCR NOT DETECTED NOT DETECTED Final   E coli (STEC) NOT DETECTED NOT DETECTED Final   Enteroaggregative E coli NOT DETECTED NOT DETECTED Final   Shigella by PCR NOT DETECTED NOT DETECTED Final   Cyclospora cayetanensis NOT DETECTED NOT DETECTED Final   Astrovirus NOT DETECTED NOT DETECTED Final   G lamblia by PCR NOT DETECTED NOT DETECTED Final   Rotavirus A by PCR NOT DETECTED NOT DETECTED Final  C Difficile Quick Screen w PCR reflex     Status: None   Collection Time: 11/01/18  1:15 PM   Specimen: Nasopharyngeal Swab; Stool  Result Value Ref Range Status   C Diff  antigen NEGATIVE NEGATIVE Final   C Diff toxin NEGATIVE NEGATIVE Final   C Diff interpretation No C. difficile detected.  Final    Comment: Performed at [facility], [place] [address] [place], [place] [address]  SARS CORONAVIRUS 2 (TAT 6-24 HRS) Nasopharyngeal Nasopharyngeal Swab     Status: None   Collection Time: 11/01/18  1:15 PM   Specimen: Nasopharyngeal Swab  Result Value Ref Range Status   SARS Coronavirus 2 NEGATIVE NEGATIVE Final    Comment: (NOTE) SARS-CoV-2 target nucleic acids are NOT DETECTED. The SARS-CoV-2 RNA is generally detectable in upper and lower respiratory specimens during the acute phase of infection. Negative results do not preclude SARS-CoV-2 infection, do not rule out co-infections with other pathogens, and should not be used as the sole basis for treatment or other patient management decisions. Negative results must be combined with clinical observations, patient history, and epidemiological information. The expected result is Negative. Fact Sheet for Patients: SugarRoll.be Fact Sheet for Healthcare Providers: https://www.woods-mathews.com/ This test is not yet approved or cleared by the [place] FDA and  has been authorized for detection and/or diagnosis of SARS-CoV-2 by FDA under an Emergency Use Authorization (EUA). This EUA will remain  in effect (meaning this test can be used) for the duration of the COVID-19 declaration under Section 56 4(b)(1) of the Act, 21 U.S.C. section 360bbb-3(b)(1), unless the authorization is terminated or revoked sooner. Performed at [facility], [place] [address] [place], [place] [address]   Culture, blood (  routine x 2)     Status: None   Collection Time: 11/01/18  1:16 PM   Specimen: BLOOD  Result Value Ref Range Status   Specimen Description   Final    BLOOD RIGHT ANTECUBITAL Performed at [facility] [address]  [place], [place] [address]    Special Requests   Final    BOTTLES DRAWN AEROBIC AND ANAEROBIC Blood Culture adequate volume Performed at [facility] [address] [place], [place] [address]    Culture   Final    NO GROWTH 5 DAYS Performed at [facility], [place] [address] [place], [place] [address]    Report Status 11/06/2018 FINAL  Final  Culture, blood (routine x 2)     Status: None   Collection Time: 11/01/18  1:16 PM   Specimen: BLOOD  Result Value Ref Range Status   Specimen Description   Final    BLOOD LEFT ANTECUBITAL Performed at [facility], [place] [address], [place], [place] [address]    Special Requests   Final    BOTTLES DRAWN AEROBIC AND ANAEROBIC Blood Culture adequate volume Performed at [facility] [address] [place], [place] [address]    Culture   Final    NO GROWTH 5 DAYS Performed at [facility], [place] [address] [place], [place] [address]    Report Status 11/06/2018 FINAL  Final  Culture, Urine     Status: Abnormal   Collection Time: 11/02/18  1:56 PM   Specimen: Urine, Catheterized  Result Value Ref Range Status   Specimen Description   Final    URINE, CATHETERIZED Performed at [facility] [address] [place], [place] [address]    Special Requests   Final    Normal Performed at [facility] [place] [address], [place], [place] [address]    Culture >=100,000 COLONIES/mL ESCHERICHIA COLI (A)  Final   Report Status 11/04/2018 FINAL  Final   Organism ID, Bacteria ESCHERICHIA COLI (A)  Final      Susceptibility   Escherichia coli - MIC*    AMPICILLIN <=2 SENSITIVE Sensitive     CEFAZOLIN <=4 SENSITIVE Sensitive     CEFTRIAXONE <=1 SENSITIVE Sensitive     CIPROFLOXACIN <=0.25 SENSITIVE Sensitive     GENTAMICIN <=1 SENSITIVE Sensitive     IMIPENEM <=0.25 SENSITIVE Sensitive     NITROFURANTOIN <=16 SENSITIVE Sensitive     TRIMETH/SULFA <=20 SENSITIVE  Sensitive     AMPICILLIN/SULBACTAM <=2 SENSITIVE Sensitive     PIP/TAZO <=4 SENSITIVE Sensitive     Extended ESBL NEGATIVE Sensitive     * >=100,000 COLONIES/mL ESCHERICHIA COLI     Time coordinating discharge: Over 30 minutes  SIGNED:   Eric J [place] DO [facility] 11/07/2018, 1:15 PM

## 2018-11-07 NOTE — TOC Progression Note (Signed)
Transition of Care [facility]) - Progression Note    Patient Details  Name: RINIYAH BRENNEKE MRN: [id number] Date of Birth: 11/20/1950  Transition of Care [facility]) CM/SW Contact  Joaquin Courts, RN Phone Number: 11/07/2018, 3:03 PM  Clinical Narrative: Cm received [facility] indicating a bed has come available at [facility].  Transported via [facility] arranged.       Expected Discharge Plan: [place] Barriers to Discharge: Hospice Bed not available(also pending hospice eligibility confirmation)  Expected Discharge Plan and Services Expected Discharge Plan: [place] arrangements for the past 2 months: Single Family Home Expected Discharge Date: 11/07/18                                     Social Determinants of Health (SDOH) Interventions    Readmission Risk Interventions No flowsheet data found.

## 2018-11-07 NOTE — Progress Notes (Signed)
Called to give report to Doreen Salvage, RN that will be receiving patient.  Patient awaiting transport via [facility].  Patient will be given 2mg  of IV morphine for transport to [facility].

## 2018-11-07 NOTE — Care Management Important Message (Signed)
Important Message  Patient Details IM Letter given to Louanna Raw RN to present to the Patient Name: Taylor Oconnor MRN: [id number] Date of Birth: Aug 17, 1950   Medicare Important Message Given:  Yes     Kerin Salen 11/07/2018, 9:06 AM

## 2018-11-07 NOTE — Progress Notes (Signed)
Manufacturing engineer [facility]) Hospital Liaison note.   [facility] is not able to offer a room today. Family and TOC manager are aware [facility] liaison will follow up tomorrow or sooner if room becomes available.   Please do not hesitate to call with questions.   Thank you,  Farrel Gordon, RN, [facility]   [place]     [place] are on AMION

## 2018-11-07 NOTE — TOC Progression Note (Signed)
Transition of Care [facility]) - Progression Note    Patient Details  Name: Taylor Oconnor MRN: [id number] Date of Birth: [birth year]  Transition of [facility]) CM/SW Contact  Joaquin Courts, RN Phone Number: 11/07/2018, 11:00 AM  Clinical Narrative:  CM spoke with hospice rep Erling Conte, no [facility] beds available today.       Expected Discharge Plan: [place] Barriers to Discharge: Hospice Bed not available(also pending hospice eligibility confirmation)  Expected Discharge Plan and Services Expected Discharge Plan: [place]       Living arrangements for the past 2 months: Single Family Home                                       Social Determinants of Health (SDOH) Interventions    Readmission Risk Interventions No flowsheet data found.

## 2018-11-07 NOTE — Progress Notes (Signed)
Nutrition Brief Note  Chart reviewed. Pt now transitioning to comfort care.  No further nutrition interventions warranted at this time.   Terriann Difonzo, MS, RD, LDN Inpatient Clinical Dietitian Pager: [phone] After Hours Pager: [phone]   

## 2018-11-08 ENCOUNTER — Inpatient Hospital Stay: Payer: Medicare Other

## 2018-11-08 ENCOUNTER — Other Ambulatory Visit: Payer: Medicare Other

## 2018-11-08 ENCOUNTER — Inpatient Hospital Stay: Payer: Medicare Other | Admitting: Nurse Practitioner

## 2018-11-13 DEATH — deceased

## 2018-11-14 ENCOUNTER — Ambulatory Visit: Payer: Medicare Other | Admitting: Hematology

## 2018-11-14 ENCOUNTER — Other Ambulatory Visit: Payer: Medicare Other

## 2018-11-14 ENCOUNTER — Ambulatory Visit: Payer: Medicare Other

## 2018-11-15 ENCOUNTER — Other Ambulatory Visit: Payer: Medicare Other

## 2018-11-15 ENCOUNTER — Ambulatory Visit: Payer: Medicare Other

## 2018-11-15 ENCOUNTER — Ambulatory Visit: Payer: Medicare Other | Admitting: Hematology

## 2018-11-16 ENCOUNTER — Ambulatory Visit: Payer: Medicare Other

## 2018-11-28 ENCOUNTER — Ambulatory Visit: Payer: Medicare Other

## 2018-11-28 ENCOUNTER — Ambulatory Visit: Payer: Medicare Other | Admitting: Hematology

## 2018-11-28 ENCOUNTER — Other Ambulatory Visit: Payer: Medicare Other

## 2018-11-30 ENCOUNTER — Ambulatory Visit: Payer: Medicare Other

## 2018-12-12 ENCOUNTER — Other Ambulatory Visit: Payer: Self-pay | Admitting: Internal Medicine

## 2018-12-12 DIAGNOSIS — Z170 Estrogen receptor positive status [ER+]: Secondary | ICD-10-CM

## 2018-12-12 DIAGNOSIS — I10 Essential (primary) hypertension: Secondary | ICD-10-CM

## 2018-12-12 DIAGNOSIS — C50411 Malignant neoplasm of upper-outer quadrant of right female breast: Secondary | ICD-10-CM

## 2018-12-20 ENCOUNTER — Encounter: Payer: Self-pay | Admitting: *Deleted

## 2019-01-19 ENCOUNTER — Ambulatory Visit: Payer: Self-pay | Admitting: Physician Assistant

## 2019-01-26 ENCOUNTER — Ambulatory Visit: Payer: Medicare Other | Admitting: Oncology

## 2019-01-26 ENCOUNTER — Other Ambulatory Visit: Payer: Medicare Other

## 2019-05-08 ENCOUNTER — Encounter: Payer: Medicare Other | Admitting: Internal Medicine

## 2021-07-01 IMAGING — US IR LEFT FLUORO GUIDE CV LINE
1 series · 1 of 1 positions shown · non-contrast
Comparison: none

CLINICAL DATA: Pancreas carcinoma

[Series 1: ir fluoro/shunt/fist · 1 of 1 slices shown]
[im 1/1]
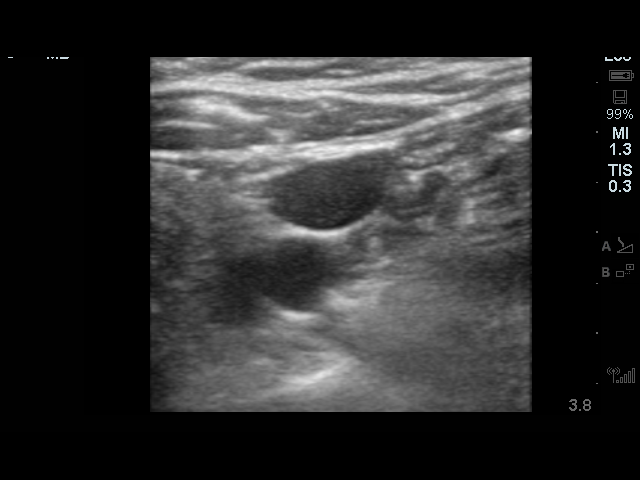

[1 of 1 positions shown; findings below may reference images not displayed]

EXAM:
RIGHT INTERNAL JUGULAR SINGLE LUMEN POWER PORT CATHETER INSERTION

Date:  07/20/2018 07/20/2018 [DATE]

Radiologist:  Farkas, Vinny

Guidance:  Ultrasound fluoroscopic

MEDICATIONS:
Ancef 2 g; The antibiotic was administered within an appropriate
time interval prior to skin puncture.

ANESTHESIA/SEDATION:
Versed 4.0 mg IV; Fentanyl 100 mcg IV;

Moderate Sedation Time:  27 minutes

The patient was continuously monitored during the procedure by the
interventional radiology nurse under my direct supervision.

FLUOROSCOPY TIME:  One minutes, 12 seconds (13 mGy)

COMPLICATIONS:
None immediate.

CONTRAST:  None.

PROCEDURE:
Informed consent was obtained from the patient following explanation
of the procedure, risks, benefits and alternatives. The patient
understands, agrees and consents for the procedure. All questions
were addressed. A time out was performed.

Maximal barrier sterile technique utilized including caps, mask,
sterile gowns, sterile gloves, large sterile drape, hand hygiene,
and 2% chlorhexidine scrub.

Under sterile conditions and local anesthesia, right internal
jugular micropuncture venous access was performed. Access was
performed with ultrasound. Images were obtained for documentation of
the patent right internal jugular vein. A guide wire was inserted
followed by a transitional dilator. This allowed insertion of a
guide wire and catheter into the IVC. Measurements were obtained
from the SVC / RA junction back to the right IJ venotomy site. In
the right infraclavicular chest, a subcutaneous pocket was created
over the second anterior rib. This was done under sterile conditions
and local anesthesia. 1% lidocaine with epinephrine was utilized for
this. A 2.5 cm incision was made in the skin. Blunt dissection was
performed to create a subcutaneous pocket over the right pectoralis
major muscle. The pocket was flushed with saline vigorously. There
was adequate hemostasis. The port catheter was assembled and checked
for leakage. The port catheter was secured in the pocket with two
retention sutures. The tubing was tunneled subcutaneously to the
right venotomy site and inserted into the SVC/RA junction through a
valved peel-away sheath. Position was confirmed with fluoroscopy.
Images were obtained for documentation. The patient tolerated the
procedure well. No immediate complications. Incisions were closed in
a two layer fashion with 4 - 0 Vicryl suture. Dermabond was applied
to the skin. The port catheter was accessed, blood was aspirated
followed by saline and heparin flushes. Needle was removed. A dry
sterile dressing was applied.
IMPRESSION: Ultrasound and fluoroscopically guided right internal jugular single
lumen power port catheter insertion. Tip in the SVC/RA junction.
Catheter ready for use.

## 2022-10-26 NOTE — Telephone Encounter (Signed)
eror
